# Patient Record
Sex: Female | Born: 1965 | Race: White | Hispanic: No | Smoking: Current every day smoker
Health system: Southern US, Community
[De-identification: ages and names within clinical notes are randomized; demographics above are authoritative.]

## PROBLEM LIST (undated history)

## (undated) DIAGNOSIS — B1920 Unspecified viral hepatitis C without hepatic coma: Secondary | ICD-10-CM

## (undated) DIAGNOSIS — A599 Trichomoniasis, unspecified: Secondary | ICD-10-CM

## (undated) DIAGNOSIS — I493 Ventricular premature depolarization: Secondary | ICD-10-CM

## (undated) DIAGNOSIS — M797 Fibromyalgia: Secondary | ICD-10-CM

## (undated) DIAGNOSIS — R569 Unspecified convulsions: Secondary | ICD-10-CM

## (undated) DIAGNOSIS — R768 Other specified abnormal immunological findings in serum: Secondary | ICD-10-CM

## (undated) DIAGNOSIS — F32A Depression, unspecified: Secondary | ICD-10-CM

## (undated) DIAGNOSIS — F1120 Opioid dependence, uncomplicated: Secondary | ICD-10-CM

## (undated) DIAGNOSIS — F1410 Cocaine abuse, uncomplicated: Secondary | ICD-10-CM

## (undated) DIAGNOSIS — M545 Low back pain, unspecified: Secondary | ICD-10-CM

## (undated) DIAGNOSIS — F332 Major depressive disorder, recurrent severe without psychotic features: Secondary | ICD-10-CM

## (undated) DIAGNOSIS — F329 Major depressive disorder, single episode, unspecified: Secondary | ICD-10-CM

## (undated) DIAGNOSIS — R896 Abnormal cytological findings in specimens from other organs, systems and tissues: Secondary | ICD-10-CM

## (undated) DIAGNOSIS — IMO0001 Reserved for inherently not codable concepts without codable children: Secondary | ICD-10-CM

## (undated) DIAGNOSIS — A159 Respiratory tuberculosis unspecified: Secondary | ICD-10-CM

## (undated) DIAGNOSIS — F419 Anxiety disorder, unspecified: Secondary | ICD-10-CM

## (undated) DIAGNOSIS — I10 Essential (primary) hypertension: Secondary | ICD-10-CM

## (undated) DIAGNOSIS — G8929 Other chronic pain: Secondary | ICD-10-CM

## (undated) DIAGNOSIS — D72829 Elevated white blood cell count, unspecified: Secondary | ICD-10-CM

## (undated) DIAGNOSIS — K219 Gastro-esophageal reflux disease without esophagitis: Secondary | ICD-10-CM

## (undated) DIAGNOSIS — E876 Hypokalemia: Secondary | ICD-10-CM

## (undated) HISTORY — DX: Respiratory tuberculosis unspecified: A15.9

## (undated) HISTORY — PX: DILATION AND CURETTAGE OF UTERUS: SHX78

## (undated) HISTORY — DX: Trichomoniasis, unspecified: A59.9

---

## 2005-01-08 ENCOUNTER — Emergency Department (HOSPITAL_COMMUNITY): Admission: EM | Admit: 2005-01-08 | Discharge: 2005-01-08 | Payer: Self-pay | Admitting: Emergency Medicine

## 2005-01-18 ENCOUNTER — Inpatient Hospital Stay (HOSPITAL_COMMUNITY): Admission: EM | Admit: 2005-01-18 | Discharge: 2005-01-19 | Payer: Self-pay | Admitting: Emergency Medicine

## 2008-08-29 ENCOUNTER — Emergency Department (HOSPITAL_COMMUNITY): Admission: EM | Admit: 2008-08-29 | Discharge: 2008-08-29 | Payer: Self-pay | Admitting: Emergency Medicine

## 2008-11-19 HISTORY — PX: SALPINGECTOMY: SHX328

## 2008-11-27 ENCOUNTER — Encounter: Payer: Self-pay | Admitting: Obstetrics and Gynecology

## 2008-11-27 ENCOUNTER — Inpatient Hospital Stay (HOSPITAL_COMMUNITY): Admission: AD | Admit: 2008-11-27 | Discharge: 2008-11-27 | Payer: Self-pay | Admitting: Obstetrics & Gynecology

## 2008-11-30 ENCOUNTER — Ambulatory Visit (HOSPITAL_COMMUNITY): Admission: AD | Admit: 2008-11-30 | Discharge: 2008-11-30 | Payer: Self-pay | Admitting: Obstetrics & Gynecology

## 2008-11-30 ENCOUNTER — Encounter: Payer: Self-pay | Admitting: Obstetrics and Gynecology

## 2011-01-01 LAB — CBC
HCT: 28.7 % — ABNORMAL LOW (ref 36.0–46.0)
Hemoglobin: 9.6 g/dL — ABNORMAL LOW (ref 12.0–15.0)
MCHC: 34 g/dL (ref 30.0–36.0)
MCHC: 33.8 g/dL (ref 30.0–36.0)
Platelets: 198 10*3/uL (ref 150–400)
RBC: 3.21 MIL/uL — ABNORMAL LOW (ref 3.87–5.11)
RDW: 16.7 % — ABNORMAL HIGH (ref 11.5–15.5)

## 2011-01-01 LAB — URINE MICROSCOPIC-ADD ON

## 2011-01-01 LAB — POCT I-STAT, CHEM 8
BUN: 18 mg/dL (ref 6–23)
Calcium, Ion: 1.14 mmol/L (ref 1.12–1.32)
Chloride: 108 mEq/L (ref 96–112)
Glucose, Bld: 87 mg/dL (ref 70–99)

## 2011-01-01 LAB — URINALYSIS, ROUTINE W REFLEX MICROSCOPIC
Bilirubin Urine: NEGATIVE
Glucose, UA: NEGATIVE mg/dL
Hgb urine dipstick: NEGATIVE
Specific Gravity, Urine: >1.03 — ABNORMAL HIGH (ref 1.005–1.030)
pH: 6 (ref 5.0–8.0)

## 2011-01-01 LAB — WET PREP, GENITAL: Clue Cells Wet Prep HPF POC: NONE SEEN

## 2011-01-01 LAB — GC/CHLAMYDIA PROBE AMP, GENITAL
Chlamydia, DNA Probe: NEGATIVE
GC Probe Amp, Genital: NEGATIVE

## 2011-01-01 LAB — DIFFERENTIAL
Basophils Relative: 1 % (ref 0–1)
Lymphocytes Relative: 24 % (ref 12–46)
Monocytes Absolute: 0.6 10*3/uL (ref 0.1–1.0)
Monocytes Relative: 7 % (ref 3–12)
Neutro Abs: 5 10*3/uL (ref 1.7–7.7)

## 2011-02-03 NOTE — Op Note (Signed)
Ariana Jensen, Ariana Jensen           ACCOUNT NO.:  [id number]   MEDICAL RECORD NO.:  [id number]          PATIENT TYPE:  AMB   LOCATION:  SDC                           FACILITY:  [facility]   PHYSICIAN:  Cynthia P. Romine, M.D.DATE OF BIRTH:  08-21-1966   DATE OF PROCEDURE:  11/30/2008  DATE OF DISCHARGE:                               OPERATIVE REPORT   PREOPERATIVE DIAGNOSIS:  Left ectopic pregnancy.   POSTOPERATIVE DIAGNOSIS:  Left ectopic pregnancy.   PATHOLOGY:  Pending.   PROCEDURE:  Laparoscopic left salpingectomy.   SURGEON:  Cynthia P. Romine, MD   ANESTHESIA:  General by endotracheal tube.   ESTIMATED BLOOD LOSS:  Minimal.   COMPLICATIONS:  None.   PROCEDURE:  The patient was taken to the operating room and after  induction of adequate general anesthesia, was placed in a dorsal  lithotomy position and prepped and draped in usual fashion.  A pelvic  examination was done, which revealed the external genitalia and vagina  to be normal as was the cervix.  There was slight bleeding in the vault,  but no active bleeding at the cervix.  The uterus was mobile and normal  size.  No masses were palpable in the adnexa.  A Foley catheter was  inserted.  Attention was next turned to the abdomen.  Marcaine 0.25% was  infiltrated below the umbilicus and a small subumbilical incision was  made.  The Veress needle was inserted into the peritoneal space.  Proper  placement was tested by noting a negative aspirate, then free flow of  saline through the Veress needle again with a negative aspirate and then  by noting the response of a drop of saline to negative pressure as the  abdominal wall was elevated.  Pneumoperitoneum was created with 2.5 L of  CO2 using the automatic insufflator.  A disposable 10/11 mm trocar was  then inserted into the peritoneal space with proper placement noted with  the laparoscope.  Inspection of the abdomen revealed there to be  approximately 20 cc of blood in the  anterior cul-de-sac.  The uterus  appeared of normal size and shape.  The left tube was dilated to about 2  cm in its mid-isthmic area consistent with a left ectopic.  The fimbriae  were luxuriant on that side.  There were some filmy adhesions between  the fimbriae and the ovary.  On the right side, the tube appeared  normal.  There were likewise some filmy adhesions between the fimbriae  and the ovary but otherwise appeared normal.  The upper abdomen was  explored.  The liver edge was smooth.  The gallbladder was distended.  No abnormalities were noted.  A tripolar cutting instrument was used to  cut across the tube at its junction with the fundus and it was used to  come across the mesosalpinx to the fimbriae.  When the tube was freed,  it was pulled out through the operative laparoscope with a long forceps  with teeth.  Good hemostasis was noted.  The pelvis was irrigated.  Photographic documentation was done.  Instruments were removed from the  abdomen.  Pneumoperitoneum was allowed to escape.  The fascia at the  umbilicus was closed with a single suture of 0 Vicryl.  The skin at the  umbilicus was closed with subcuticular suture of 3-0 Vicryl and then the  5-mm incisions in the right and left lower quadrant, which had been made  and placed under direct visualization, were closed with Dermabond.  It  should be noted her blood type was A+.      Cynthia P. Romine, M.D.  Electronically Signed     CPR/MEDQ  D:  11/30/2008  T:  12/01/2008  Job:  [id number]

## 2011-02-06 NOTE — Discharge Summary (Signed)
NAMELASHUNDA, Ariana Jensen NO.:  [id number]   MEDICAL RECORD NO.:  [id number]          PATIENT TYPE:  INP   LOCATION:  0353                         FACILITY:  [facility]   PHYSICIAN:  Ariana Shirk, MD     DATE OF BIRTH:  10-15-1965   DATE OF ADMISSION:  01/18/2005  DATE OF DISCHARGE:  01/19/2005                                 DISCHARGE SUMMARY   DISCHARGE DIAGNOSES:  1. Overdose on Fioricet.  2. Hypokalemia.     DISCHARGE MEDICATIONS:  None - the patient left AMA.   FOLLOW-UP APPOINTMENTS:  With [facility] as scheduled before.   HISTORY OF PRESENT ILLNESS:  [name] is a 45 year old Caucasian woman  with a past history of drug abuse including heroin, cocaine, and marijuana  on drug rehab and on methadone treatment for 4-5 years. Was seen by Dr.  Cherly Jensen about a week ago and given a prescription for Fioricet and Soma. The  patient took 36 tablets of Soma in the 6 days prior to admission, 15 of them  in the 36 hours prior to admission. The patient's husband stated when the  patient was brought to the ED that she had a prescription for Fioricet  refilled on the day of admission and the patient took 24 of them, at which  time the patient's husband called 911 and brought her to the emergency room.  The patient stated in the ER that she took it because she was angry with her  husband and had some difficulty with family dynamics. At the time of  admission the patient was communicating, had no other complaints other than  her body being hot. No nausea/vomiting, no abdominal pain, no change in  vision, no loss of consciousness. Tylenol level in the ED was 125.3;  repeated came down to 98.2. The patient received a dose of Mucomyst and her  liver function tests were normal. UDS was positive for cocaine.   PAST MEDICAL HISTORY:  1. IV drug abuse.  2. Polysubstance abuse including heroin, cocaine, and marijuana.     PHYSICAL EXAMINATION ON ADMISSION:  VITAL SIGNS:  Blood  pressure 129/72,  pulse 103, respirations 18, saturations 97%, temperature 98.9.  GENERAL:  Alert and oriented x3 in no acute distress.  HEENT:  PERRL, normocephalic, atraumatic. Sclerae anicteric, normal.  NECK: Supple, no LAD, no JVD.  LUNGS:  Clear to auscultation bilaterally.  ABDOMEN:  Soft, no tenderness, no masses.  CARDIOVASCULAR:  S1 + S2, regular rate and rhythm.  EXTREMITIES:  Multiple scratch marks on the lower extremities in different  stages of healing. No cyanosis, clubbing, or edema.  NEUROLOGIC:  No focal deficits.   LABORATORY DATA ON ADMISSION:  Sodium 139, potassium 3.1, chloride 108,  bicarb 24, glucose 131, BUN 10, creatinine 0.7, bili 0.6, alkaline  phosphatase 42, AST 21, ALT 20, total protein 6.5, albumin 3.9, calcium 9.2.  Alcohol level less than 5. Acetaminophen level 125.3 upon initial arrival; 2  hours later, 98.2. Urine pregnancy test negative. UDS positive for  barbiturates and cocaine. Salicylate level less than 4.   HOSPITAL COURSE:  The patient was admitted to  a monitored bed.   #1 - DRUG OVERDOSE. The patient was seen by Dr. Jeanie Jensen of psychiatry and  determined that she did not need commitment and she had agreed to call  emergency for distress. She also has appointments with [facility] and the methadone  clinic. The patient was released by psychiatry for discharge.   #2 - HYPOKALEMIA. The patient's potassium was 3.1 on admission. She was  given p.o. potassium. Repeat levels were 4.1.   On the evening of Jan 19, 2005 after the patient was seen by Dr. Jeanie Jensen,  she signed out against medical advice. She was advised by the nursing staff  of the risks of signing out against medical advice. However, the patient  insisted on leaving AMA and left the hospital at 10:15 p.m. on Jan 19, 2005.      GDK/MEDQ  D:  01/20/2005  T:  01/20/2005  Job:  [id number]   cc:   Ariana Jensen, M.D.

## 2011-02-06 NOTE — H&P (Signed)
NAME:  Ariana Jensen, WEIER NO.:  [id number]   MEDICAL RECORD NO.:  [id number]          PATIENT TYPE:  EMS   LOCATION:  ED                           FACILITY:  [facility]   PHYSICIAN:  Mobolaji B. Bakare, M.D.DATE OF BIRTH:  04-28-1966   DATE OF ADMISSION:  01/18/2005  DATE OF DISCHARGE:                                HISTORY & PHYSICAL   PRIMARY CARE PHYSICIAN:  Dr. Cherly Hensen at [facility].   CHIEF COMPLAINT:  Overdose with Fioricet 24 tablets between 1:00 and 3:00  p.m. today.   HISTORY OF PRESENT ILLNESS:  Ariana Jensen is a 45 year old Caucasian female  with past medical history of drug abuse, heroin, cocaine, marijuana.  The  patient has undergone drug rehabilitation and currently on methadone  treatment for 4-5 years, she saw Dr. Cherly Hensen about a week ago and she was  given prescription for Fioricet and soma.  She has taken 36 tablets of soma  in the last six days, 15 of those were taken in the last 36 hours.   Her husband filled the prescription for Fioricet today and he stated that  the patient took these tablets the whole lot of 24, between 1:00 and 3:00  p.m. hence he called 911 and the patient was brought to the emergency  department.  The patient stated she took it because she is angry with her  husband and apparently there is some family dynamics going on in their  relationship.  The patient is currently communicating and she has no  complaints other than her whole body is hot, she denies any headaches, no  nausea, no vomiting, no epigastric pain, no change in her vision, and there  has been no loss of consciousness.  She had acetaminophen level done in the  emergency department at 4:30 p.m. which was 125.3 and this was repeated at  6:30 p.m. it came down to 98.2.  The patient received a dose of Mucomyst for  the second acetaminophen level and liver function tests, AST, ALT were  within normal, urine drug screen is positive for cocaine.   REVIEW OF SYSTEMS:  No  fever, no chills, no cough, no chest pain, no  abdominal pain, no dysuria or urgency.   PAST MEDICAL HISTORY:  1.  IV drug abuse.  2.  Polysubstance abuse with heroin, cocaine and marijuana.   PAST SURGICAL HISTORY:  None.   FAMILY HISTORY:  She is married and she has been with her current husband  for about five years.  She has as grown up child.   SOCIAL HISTORY:  She denies alcohol abuse and smoking.  She is currently  unemployed.   VITAL SIGNS:  Temperature 98.9, blood pressure 129/72, pulse 103,  respiratory rate 18, O2 saturation 97%.  On examination the patient is  awake, alert, oriented, intact time, place and person.  Pupils equal, round,  reactive to light.  No elevated JVD, no thyromegaly.  Mucous membranes  moist.  LUNGS:  Clear clinically to auscultation.  CVS:  S1, S2 regular, no murmur, no gallop.  ABDOMEN:  Nondistended, soft, nontender, no hepatosplenomegaly, bowel sounds  present.  EXTREMITIES:  She has got multiple scratch marks on the lower extremities  which are in different states of healing.  No pedal edema.  Dorsalis pedis  pulses palpable bilaterally.  SKIN:  No rash, no petechiae.  CNS:  No focal neurological deficit.   INITIAL LABORATORY DATA:  Sodium 139, potassium 3.1, chloride 108, bicarb  24, glucose 131, BUN 10, creatinine 0.7, bilirubin 0.6, alkaline phosphatase  44, AST 21, ALT 20, total protein 6.5, albumin 3.9, calcium 9.2, alcohol  level <5, acetaminophen level 125.3 at 4:25 p.m., repeated level at 6:30 was  98.2.  Urine pregnancy test was negative.  Tricyclic none detected.  Urine  drug screen was positive for barbiturates and cocaine.  Salicylate level was  less than 4.  EKG not available now.   ASSESSMENT:  1.  Overdose with Fioricet and soma.  2.  Attempted suicide.  3.  Substance abuse (cocaine).  4.  Hypokalemia.   PLAN:  1.  Continue 24-hour sitter.  We will discontinue Mucomyst as level of      acetaminophen is less than 150  within the possible four hours post      ingestion.  2.  Follow up liver function tests in the a.m.  3.  Obtain psychiatric evaluation.  4.  Supportive care.  5.  We will give Kayexalate 40 mEq x1 now and check BMET in the a.m.  6.  The patient will need inpatient admission to behavioral health Center.      MBB/MEDQ  D:  01/18/2005  T:  01/18/2005  Job:  [id number]

## 2011-04-02 ENCOUNTER — Encounter: Payer: Self-pay | Admitting: Internal Medicine

## 2011-04-02 ENCOUNTER — Observation Stay (HOSPITAL_COMMUNITY)
Admission: EM | Admit: 2011-04-02 | Discharge: 2011-04-03 | Disposition: A | Discharge status: Home / Self Care | Payer: Self-pay | Attending: Internal Medicine | Admitting: Internal Medicine

## 2011-04-02 ENCOUNTER — Emergency Department (HOSPITAL_COMMUNITY): Payer: Self-pay

## 2011-04-02 DIAGNOSIS — F1120 Opioid dependence, uncomplicated: Secondary | ICD-10-CM | POA: Insufficient documentation

## 2011-04-02 DIAGNOSIS — F1911 Other psychoactive substance abuse, in remission: Secondary | ICD-10-CM | POA: Insufficient documentation

## 2011-04-02 DIAGNOSIS — A599 Trichomoniasis, unspecified: Secondary | ICD-10-CM | POA: Insufficient documentation

## 2011-04-02 DIAGNOSIS — R079 Chest pain, unspecified: Principal | ICD-10-CM | POA: Insufficient documentation

## 2011-04-02 DIAGNOSIS — F17200 Nicotine dependence, unspecified, uncomplicated: Secondary | ICD-10-CM | POA: Insufficient documentation

## 2011-04-02 DIAGNOSIS — R0789 Other chest pain: Secondary | ICD-10-CM | POA: Insufficient documentation

## 2011-04-02 LAB — URINALYSIS, ROUTINE W REFLEX MICROSCOPIC
Nitrite: NEGATIVE
Protein, ur: NEGATIVE mg/dL
Specific Gravity, Urine: 1.031 — ABNORMAL HIGH (ref 1.005–1.030)
Urobilinogen, UA: 1 mg/dL (ref 0.0–1.0)

## 2011-04-02 LAB — COMPREHENSIVE METABOLIC PANEL
BUN: 25 mg/dL — ABNORMAL HIGH (ref 6–23)
CO2: 24 mEq/L (ref 19–32)
Calcium: 8.1 mg/dL — ABNORMAL LOW (ref 8.4–10.5)
Chloride: 103 mEq/L (ref 96–112)
Creatinine, Ser: 0.57 mg/dL (ref 0.50–1.10)
GFR calc Af Amer: >60 mL/min (ref >60)
GFR calc non Af Amer: >60 mL/min (ref >60)
Glucose, Bld: 97 mg/dL (ref 70–99)
Total Bilirubin: 0.1 mg/dL — ABNORMAL LOW (ref 0.3–1.2)

## 2011-04-02 LAB — TROPONIN I: Troponin I: <0.3 ng/mL (ref <0.30)

## 2011-04-02 LAB — POCT PREGNANCY, URINE: Preg Test, Ur: NEGATIVE

## 2011-04-02 LAB — CBC
Hemoglobin: 12.5 g/dL (ref 12.0–15.0)
MCHC: 34 g/dL (ref 30.0–36.0)
Platelets: 179 10*3/uL (ref 150–400)
RDW: 16.1 % — ABNORMAL HIGH (ref 11.5–15.5)

## 2011-04-02 LAB — DIFFERENTIAL
Basophils Absolute: 0 10*3/uL (ref 0.0–0.1)
Basophils Relative: 0 % (ref 0–1)
Eosinophils Absolute: 0.5 10*3/uL (ref 0.0–0.7)
Monocytes Absolute: 0.5 10*3/uL (ref 0.1–1.0)
Neutro Abs: 7.2 10*3/uL (ref 1.7–7.7)

## 2011-04-02 LAB — CK TOTAL AND CKMB (NOT AT ARMC): Total CK: 47 U/L (ref 7–177)

## 2011-04-02 LAB — URINE MICROSCOPIC-ADD ON

## 2011-04-02 LAB — RAPID URINE DRUG SCREEN, HOSP PERFORMED
Amphetamines: NOT DETECTED
Benzodiazepines: NOT DETECTED
Tetrahydrocannabinol: NOT DETECTED

## 2011-04-02 LAB — CARDIAC PANEL(CRET KIN+CKTOT+MB+TROPI)
CK, MB: 1.7 ng/mL (ref 0.3–4.0)
Total CK: 35 U/L (ref 7–177)

## 2011-04-02 NOTE — H&P (Signed)
Hospital Admission Note Date: 04/02/2011  Patient name: Ariana Jensen Medical record number: [id number] Date of birth: 08-Oct-1965 Age: 45 y.o. Gender: female PCP: No primary provider on file.  This is an unassigned patient  Attending physician: Dr. Josem Kaufmann  First contact: Resident (R1): Dr. Abner Greenspan   Pager: [phone] Second contact: Resident (R3): Dr. Baltazar Apo   Pager: [phone]  Weekends, holidays, after 5 PM weekdays First contact: [phone] Second contact: [phone]  Chief Complaint: Chest pain  History of Present Illness:  Patient is a 45 year-old female with past medical history significant for  IV drug use approximately 10 years ago. She presents the emergency department with a three-day history of right sided, 6.5/10 chest pain radiating into her back. She admits to some shortness of breath associated with her pain and states her chest discomfort worsens with deep inspiration. She states her pain is worse when lying on her left side but otherwise is without aggravating factors. She notes that her pain is improved after receiving her morning methadone dose as well as was taking over-the-counter aspirin and Aleve; the aspirin and Aleve alleviated her pain for approximately 2 hours.  Her chest pain was slightly improved after receiving nitroglycerin and morphine in the emergency department. She denies any headache, dyspnea on exertion, exertional chest pain, syncope, diaphoresis, nausea, vomiting, abdominal pain, diarrhea or  other complaint.  Allergies: NKDA  Home medications: None 95 mg tablet by mouth every morning  Past medical history: History of tuberculosis at age 89     - Status post one year of multidrug therapy History of left ectopic pregnancy    - Status post left salpingectomy, March 2010 History of cesarean section, remote History of suicide attempt with Fiorcet History of polysubstance abuse: cocaine, heroin, marijuana    - Patient is currently on methadone  maintenance; she enrolled in the program approximately 10 years ago   Social history: She is widowed, her husband died 2 years ago as a result of a massive MI. She currently lives in [place] with her boyfriend. She is unemployed. She is a current smoker; smokes one pack per day for the past 20-25 years.  Has a history of IV heroin use. She states she's been clean for the past 10 years and is currently enrolled in a methadone maintenance program. She admits to rare alcohol use.   Family history: Mother and father are both deceased from lung cancer. He denies a family history of diabetes mellitus, hypertension, or cardio vascular disease.   Review of Systems: Pertinent items are noted in HPI.  Physical Exam:  Vital signs: T.: 97.7, HR: 56, BP: 156/93, RR.: 16, O2 sat: 100% on RA VItal signs reviewed and stable. GEN: No apparent distress.  Alert and oriented x 3.  Pleasant, conversant, and cooperative to exam. HEENT: head is autraumatic and normocephalic.  Neck is supple without palpable masses or lymphadenopathy. Vision intact.  EOMI.  PERRLA.  Sclerae anicteric.  Conjunctivae without pallor or injection. Mucous membranes are moist.  Oropharynx is without erythema, exudates, or other abnormal lesions.  Dentition is fair. RESP:  Lungs are clear to ascultation bilaterally with good air movement.  No wheezes, ronchi, or rubs. CARDIOVASCULAR: Bradycardic, normal rhythm.  Clear S1, S2, no murmurs, gallops, or rubs.  Chest wall tenderness along the right mid axillary line. ABDOMEN: soft, non-tender, non-distended.  Bowels sounds present in all quadrants and normoactive.  No palpable masses. EXT: warm and dry.  Peripheral pulses equal, intact, and +2 globally.  No clubbing or  cyanosis.  No edema in bilateral lower extremities. SKIN: warm and dry with normal turgor.  No rashes or abnormal lesions observed. NEURO: CN II-XII grossly intact.  Muscle strength +5/5 in bilateral upper and lower  extremities.  Sensation is grossly intact.  No focal deficit.    Lab results:  WBC                                      10.7       h      4.0-10.5         K/uL  RBC                                      4.06              3.87-5.11        MIL/uL  Hemoglobin (HGB)                         12.5              12.0-15.0        g/dL  Hematocrit (HCT)                         36.8              36.0-46.0        %  MCV                                      90.6              78.0-100.0       fL  MCH -                                    30.8              26.0-34.0        pg  MCHC                                     34.0              30.0-36.0        g/dL  RDW                                      16.1       h      11.5-15.5        %  Platelet Count (PLT)                     179               150-400          K/uL  Neutrophils, %                           67  43-77            %  Lymphocytes, %                           23                12-46            %  Monocytes, %                             5                 3-12             %  Eosinophils, %                           5                 0-5              %  Basophils, %                             0                 0-1              %  Neutrophils, Absolute                    7.2               1.7-7.7          K/uL  Lymphocytes, Absolute                    2.5               0.7-4.0          K/uL  Monocytes, Absolute                      0.5               0.1-1.0          K/uL  Eosinophils, Absolute                    0.5               0.0-0.7          K/uL  Basophils, Absolute                      0.0               0.0-0.1          K/uL  Sodium (NA)                              135               135-145          mEq/L  Potassium (K)                            4.2  3.5-5.1          mEq/L  Chloride                                 103               96-112           mEq/L  CO2                                      24                 19-32            mEq/L  Glucose                                  97                70-99            mg/dL  BUN                                      25         h      6-23             mg/dL  Creatinine                               0.57              0.50-1.10        mg/dL  GFR, Est Non African American            >60               >60              mL/min  GFR, Est African American                >60               >60              mL/min  Bilirubin, Total                         0.1        l      0.3-1.2          mg/dL  Alkaline Phosphatase                     45                39-117           U/L  SGOT (AST)                               22                0-37             U/L    SLIGHT HEMOLYSIS  SGPT (ALT)  18                0-35             U/L  Total  Protein                           6.7               6.0-8.3          g/dL  Albumin-Blood                            3.1        l      3.5-5.2          g/dL  Calcium                                  8.1        l      8.4-10.5         Mg/dL   Pregnancy, Urine-POC                     NEGATIVE  Color, Urine                             YELLOW            YELLOW  Appearance                               CLOUDY     a      CLEAR  Specific Gravity                         1.031      h      1.005-1.030  pH                                       6.0               5.0-8.0  Urine Glucose                            NEGATIVE          NEG              mg/dL  Bilirubin                                NEGATIVE          NEG  Ketones                                  NEGATIVE          NEG              mg/dL  Blood  NEGATIVE          NEG  Protein                                  NEGATIVE          NEG              mg/dL  Urobilinogen                             1.0               0.0-1.0          mg/dL  Nitrite                                  NEGATIVE          NEG  Leukocytes                                SMALL      a      NEG  Squamous Epithelial / LPF                MANY       a      RARE  WBC / HPF                                3-6               <3               WBC/hpf  RBC / HPF                                0-2               <3               RBC/hpf  Bacteria / HPF                           FEW        a      RARE  Urine-Other                              SEE NOTE.    TRICHOMONAS PRESENT   Creatine Kinase, Total                   47                7-177            U/L  CK, MB                                   1.7               0.3-4.0          ng/mL  Troponin I                               <  0.30             <0.30            ng/mL  Imaging results:  Chest x-ray, portable: No acute process  Assessment & Plan by Problem:  Chest pain Patient is presenting with a three-day history of progressive, right-sided chest pain.  Her symptoms are not typical of cardiac chest pain.  Her initial set of cardiac enzymes is negative and her EKG is without evidence of ischemia or other concerning process. Her chest x-ray does not really feel evidence of pneumothorax or pneumonia.  She is a current smoker and has not seen a physician in a number of years; it is reasonable to proceed with full evaluation for ACS. Her chest pain is most likely the result of musculoskeletal strain plus minus GERD.  Gallbladder dysfunction is also possible however this seems less likely as her symptoms are not exacerbated by food and she's not experience any nausea, vomiting, or diarrhea.  Her bias Geneva score is very low probability for PE; therefore not proceed with further workup for this.   - Admit to telemetry - Cycle cardiac enzymes - Risk stratify with fasting lipid panel and hemoglobin A1c - Repeat 12-lead EKG in the morning - Protonix for treatment of possible GERD - Pain control with Toradol - Urine drug screen to evaluate for possible cocaine induced chest pain  Trichomoniasis Trichomonas was evident on  routine urinalysis obtain an emergent apartment. This result was discussed with the patient she was informed that this is a sexual transmitted disease and that her partner will require treatment.  She is advised to avoid further unprotected sexual intercourse with her partner until he completes treatment. -  Will check HIV Ab, urine for gonorrhea and chlamydia, acute hepatitis panel -  Will treat with Flagyl  Methadone Maintenance  - Will continue with her daily methadone regimen while she is an inpatient   Tobacco use  - Nicotine patch for management of withdrawal  - Smoking cessation consult   VTE prophylaxis: Lovenox     ___________________  Nelda Bucks, DO  (PGY-3)     ___________________  Kathreen Cosier, MD (PGY-1)

## 2011-04-03 LAB — HIV ANTIBODY (ROUTINE TESTING W REFLEX): HIV: NONREACTIVE

## 2011-04-03 LAB — HEPATITIS PANEL, ACUTE
HCV Ab: REACTIVE — AB
Hep A IgM: NEGATIVE
Hepatitis B Surface Ag: NEGATIVE

## 2011-04-03 LAB — LIPID PANEL
Cholesterol: 164 mg/dL (ref 0–200)
LDL Cholesterol: 77 mg/dL (ref 0–99)
Total CHOL/HDL Ratio: 3 RATIO
VLDL: 33 mg/dL (ref 0–40)

## 2011-04-03 LAB — CARDIAC PANEL(CRET KIN+CKTOT+MB+TROPI)
Relative Index: INVALID (ref 0.0–2.5)
Total CK: 38 U/L (ref 7–177)
Troponin I: <0.3 ng/mL (ref <0.30)

## 2011-04-03 LAB — URINE CULTURE

## 2011-04-05 LAB — GC/CHLAMYDIA PROBE AMP, URINE: GC Probe Amp, Urine: NEGATIVE

## 2011-04-17 ENCOUNTER — Encounter: Payer: Self-pay | Admitting: Internal Medicine

## 2011-04-17 ENCOUNTER — Telehealth: Payer: Self-pay | Admitting: Internal Medicine

## 2011-04-17 DIAGNOSIS — F1910 Other psychoactive substance abuse, uncomplicated: Secondary | ICD-10-CM

## 2011-04-17 DIAGNOSIS — R768 Other specified abnormal immunological findings in serum: Secondary | ICD-10-CM

## 2011-04-17 DIAGNOSIS — A599 Trichomoniasis, unspecified: Secondary | ICD-10-CM

## 2011-04-17 HISTORY — DX: Trichomoniasis, unspecified: A59.9

## 2011-04-17 NOTE — Telephone Encounter (Signed)
Patient was called, and a message was left on her machine.  She was informed that she missed her appointment with [place] today.  She was told that she had some abnormalities on her lab results from her recent hospitalization, and that she should call our clinic front desk to schedule an appointment to discuss these results, and run some follow-up lab work.  Absolutely no specific medical details were given, in case the phone is shared by other people.  At her first visit, we will plan to follow-up her recent positive HCV Ab with PCR testing, and can discuss treatment options at that time.

## 2011-04-25 NOTE — Discharge Summary (Signed)
NAMEBETTYJO, LUNDBLAD NO.:  [id number]  MEDICAL RECORD NO.:  [id number]  LOCATION:  3708                         FACILITY:  [facility]  PHYSICIAN:  Doneen Poisson, MD     DATE OF BIRTH:  27-Jan-1966  DATE OF ADMISSION:  04/02/2011 DATE OF DISCHARGE:  04/03/2011                              DISCHARGE SUMMARY   DISCHARGE DIAGNOSES: 1. Atypical chest pain. 2. Trichomoniasis. 3. Methadone maintenance. 4. Tobacco use. 5. History of drug abuse.  DISCHARGE MEDICATIONS WITH ACCURATE DOSES: 1. Hydrochlorothiazide 25 mg p.o. daily. 2. Aleve 220 mg p.o. q.3-4 h. p.r.n. pain. 3. Methadone 95 mg p.o. daily.  DISPOSITION AND FOLLOWUP:  [facility], Dr. Manson Passey on April 17, 2011, at 3 p.m.  At this visit, she needs a basic metabolic panel because she was started on hydrochlorothiazide.  PROCEDURE PERFORMED:  None.  CONSULTATION:  None.  BRIEF ADMITTING HISTORY AND PHYSICAL:  This was a 45 year old woman with a past medical history significant for IV drug use approximately 10 years ago.  She has been on methadone maintenance since then.  She presented to the emergency room with a 3-day history of right-sided 6.5/10 chest pain radiating to her back.  She has also had some shortness of breath and stated that her chest discomfort worsened with deep inspiration. Her pain was also worse when lying on her left side, but otherwise was without aggravating factors.  She had tried taking over-the-counter pain medicines without much relief, but her chest pain was slightly improved in the ED after receiving some morphine.  She denied any headache, nausea, vomiting, dyspnea on exertion, exertional chest pain, syncope, diaphoresis, abdominal pain, or any other complaints.  PHYSICAL EXAMINATION: VITAL SIGNS:  Temperature 97.7, heart rate 56, BP 156/93, respiratory  rate 16, O2 saturation 100% on room air. GENERAL:  No acute distress.  Alert and oriented x  3. RESPIRATORY:  Clear to auscultation bilaterally with good air movement.   No wheezes, rhonchi, or rales. CARDIOVASCULAR:  Bradycardic with a normal rhythm.  Clear S1 and S2.  No murmurs, rubs, or gallops.  Chest wall tenderness along the right  midaxillary line. ABDOMEN:  Soft, nontender, and nondistended with normal bowel sounds. EXTREMITIES:  Warm and dry with no edema. NEURO:  Grossly intact.  LAB RESULTS:  White blood cell count 10.7, hemoglobin 12.5,  hematocrit 36.8, platelets 179.  Sodium 135, potassium 4.2,  chloride 103, CO2 24, BUN 25, creatinine 0.57, glucose 97, alkaline  phosphatase 45, bilirubin 0.1, AST 22, ALT 18, albumin 3.1.  Urine  pregnancy test negative.  Urinalysis Trichomonas.  Cardiac enzymes  negative.  Chest x-ray showed no acute process  EKG unremarkable.  HOSPITAL COURSE BY PROBLEM: 1. Chest pain.  Ms. Wenner presented with atypical chest     pain that manifested on the right side midaxillary line that was     worse with deep inspiration.  Given her lack of risk factors,      initially negative lab values and normal EKG a cardiac source of      this chest pain was very unlikely.  She also had a very low      modified Geneva score and the risk  of PE was also quite low.       Leading diagnoses were musculoskeletal, gastroenterologic or      pleuritis with an outside chance of being some early manifestation      of zoster.  The following morning, her pain worsened acutely.  At the     bedside, she was examined and found to have similar pain in the     same exact area with minimal change in the quality or type of her     symptoms.  Toradol therapy was continued for antiinflammatory     effects and a few hours later her pain had resolved completely.       Her cardiac enzymes continued to be negative and her urine drug      screen was negative.  She was very happy and ready for discharge      and felt that her pain had been treated adequately.  By the  time      of discharge, pleuritis or musculoskeletal were the most likely      cause of her chest pain.  2. Trichomoniasis.  Ms. Uriegas is in a relationship with her     boyfriend of roughly 1 year and she claims that he is her only     sexual partner.  We discussed with her the importance of having him     get treated for Trichomonas too.  She was treated with 2 g     of Flagyl while in the hospital for her trichomoniasis.  Her HIV     antibody was negative and her urine gonorrhea and Chlamydia are     were pending at discharge.  3. Methadone maintenance.  Ms. Mcphearson was continued on a daily     methadone regimen and was to be discharged to continue that at the     [facility] where she receives her methadone.  4. Tobacco use.  Ms. Lozada was given a nicotine patch and counseled     on smoking cessation.  DISCHARGE VITAL SIGNS:  Temperature 98.0, pulse 47,  respiratory rate 18, blood pressure 164/94, O2 saturation 90% on room air.   ______________________________ Kathreen Cosier, MD   ______________________________ Doneen Poisson, MD   BW/MEDQ  D:  04/03/2011  T:  04/04/2011  Job:  [id number]  cc:   [facility]  Electronically Signed by Kathreen Cosier MD on 04/13/2011 02:54:49 PM Electronically Signed by Doneen Poisson  on 04/25/2011 09:44:18 AM

## 2011-04-30 ENCOUNTER — Encounter: Payer: Self-pay | Admitting: Internal Medicine

## 2011-06-19 ENCOUNTER — Emergency Department (HOSPITAL_COMMUNITY)
Admission: EM | Admit: 2011-06-19 | Discharge: 2011-06-19 | Disposition: A | Discharge status: Home / Self Care | Payer: Self-pay | Attending: Emergency Medicine | Admitting: Emergency Medicine

## 2011-06-19 DIAGNOSIS — IMO0002 Reserved for concepts with insufficient information to code with codable children: Secondary | ICD-10-CM | POA: Insufficient documentation

## 2011-06-19 DIAGNOSIS — S61209A Unspecified open wound of unspecified finger without damage to nail, initial encounter: Secondary | ICD-10-CM | POA: Insufficient documentation

## 2011-06-19 DIAGNOSIS — I10 Essential (primary) hypertension: Secondary | ICD-10-CM | POA: Insufficient documentation

## 2011-06-26 LAB — DIFFERENTIAL
Basophils Absolute: 0 10*3/uL (ref 0.0–0.1)
Basophils Relative: 0 % (ref 0–1)
Eosinophils Relative: 2 % (ref 0–5)
Monocytes Absolute: 0.4 10*3/uL (ref 0.1–1.0)
Neutro Abs: 6.8 10*3/uL (ref 1.7–7.7)

## 2011-06-26 LAB — CBC
HCT: 34.7 % — ABNORMAL LOW (ref 36.0–46.0)
Hemoglobin: 11.2 g/dL — ABNORMAL LOW (ref 12.0–15.0)
MCHC: 32.2 g/dL (ref 30.0–36.0)
Platelets: 237 10*3/uL (ref 150–400)
RDW: 17.8 % — ABNORMAL HIGH (ref 11.5–15.5)

## 2011-06-26 LAB — URINALYSIS, ROUTINE W REFLEX MICROSCOPIC
Ketones, ur: NEGATIVE mg/dL
Nitrite: NEGATIVE
Protein, ur: NEGATIVE mg/dL
Urobilinogen, UA: 0.2 mg/dL (ref 0.0–1.0)

## 2011-06-26 LAB — POCT I-STAT, CHEM 8
Calcium, Ion: 1.15 mmol/L (ref 1.12–1.32)
HCT: 36 % (ref 36.0–46.0)
TCO2: 25 mmol/L (ref 0–100)

## 2011-09-27 ENCOUNTER — Emergency Department (HOSPITAL_COMMUNITY)
Admission: EM | Admit: 2011-09-27 | Discharge: 2011-09-27 | Disposition: A | Discharge status: Home / Self Care | Payer: Self-pay | Attending: Emergency Medicine | Admitting: Emergency Medicine

## 2011-09-27 ENCOUNTER — Encounter (HOSPITAL_COMMUNITY): Payer: Self-pay | Admitting: *Deleted

## 2011-09-27 DIAGNOSIS — I10 Essential (primary) hypertension: Secondary | ICD-10-CM | POA: Insufficient documentation

## 2011-09-27 DIAGNOSIS — K0889 Other specified disorders of teeth and supporting structures: Secondary | ICD-10-CM

## 2011-09-27 DIAGNOSIS — K029 Dental caries, unspecified: Secondary | ICD-10-CM | POA: Insufficient documentation

## 2011-09-27 DIAGNOSIS — K089 Disorder of teeth and supporting structures, unspecified: Secondary | ICD-10-CM | POA: Insufficient documentation

## 2011-09-27 DIAGNOSIS — Z8611 Personal history of tuberculosis: Secondary | ICD-10-CM | POA: Insufficient documentation

## 2011-09-27 HISTORY — DX: Essential (primary) hypertension: I10

## 2011-09-27 MED ORDER — HYDROMORPHONE HCL 4 MG PO TABS: 4.0000 mg | ORAL_TABLET | ORAL | Status: AC | PRN

## 2011-09-27 MED ORDER — PENICILLIN V POTASSIUM 500 MG PO TABS: 1000.0000 mg | ORAL_TABLET | Freq: Two times a day (BID) | ORAL | Status: AC

## 2011-09-27 NOTE — ED Provider Notes (Signed)
History     CSN: [id number]  Arrival date & time 09/27/11  1532   First MD Initiated Contact with Patient 09/27/11 1914      Chief Complaint  Patient presents with  . Mouth Lesions    (Consider location/radiation/quality/duration/timing/severity/associated sxs/prior treatment) HPI This 46 female has chronic dental pain she has had multiple dental extractions and still is multiple dental caries with poor dentition she now is few days if worse than usual pain to the left maxillary region by her few remaining teeth in that area without posturing and sure swelling or fever or trauma. She is no headache no neck swelling no stridor no drooling no trismus. She is no chest pain cough shortness breath abdominal pain or vomiting. She is no neck pain or back pain. The pain is severe she is no treatment prior to arrival other than her baseline methadone for chronic pain which is helping her acute pain exacerbation. Past Medical History  Diagnosis Date  . Tuberculosis     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 age 42, now s/p 1-yr of multidrug treatment (dates unknown)  . Hypertension     Past Surgical History  Procedure Date  . Cesarean section   . Left salpingectomy March 2010    ectopic pregnancy    Family History  Problem Relation Age of Onset  . Lung cancer Mother   . Lung cancer Father     History  Substance Use Topics  . Smoking status: Current Everyday Smoker -- 1.0 packs/day for 20 years  . Smokeless tobacco: Not on file  . Alcohol Use: No    OB History    Grav Para Term Preterm Abortions TAB SAB Ect Mult Living                  Review of Systems  Constitutional: Negative for fever.       10 Systems reviewed and are negative for acute change except as noted in the HPI.  HENT: Negative for congestion.   Eyes: Negative for discharge and redness.  Respiratory: Negative for cough and shortness of breath.   Cardiovascular: Negative for chest pain.  Gastrointestinal:  Negative for vomiting and abdominal pain.  Musculoskeletal: Negative for back pain.  Skin: Negative for rash.  Neurological: Negative for syncope, numbness and headaches.  Psychiatric/Behavioral:       No behavior change.    Allergies  Review of patient's allergies indicates no known allergies.  Home Medications   Current Outpatient Rx  Name Route Sig Dispense Refill  . METHADONE HCL 5 MG/5ML PO SOLN Oral Take 60 mg by mouth.     Marland Kitchen HYDROMORPHONE HCL 4 MG PO TABS Oral Take 1 tablet (4 mg total) by mouth every 4 (four) hours as needed for pain. 10 tablet 0  . PENICILLIN V POTASSIUM 500 MG PO TABS Oral Take 2 tablets (1,000 mg total) by mouth 2 (two) times daily. X 7 days 28 tablet 0    BP 146/92  Pulse 82  Temp(Src) 98.6 F (37 C) (Oral)  Resp 20  SpO2 99%  LMP 08/27/2011  Physical Exam  Nursing note and vitals reviewed. Constitutional:       Awake, alert, nontoxic appearance.  HENT:  Head: Atraumatic.  Mouth/Throat: Oropharynx is clear and moist. No oropharyngeal exudate.       Poor dentition with multiple caries the left maxillary region by a remaining few teeth is diffusely tender without fluctuance or purulent drainage there is no stridor no drooling no  trismus and her neck is nontender  Eyes: Conjunctivae are normal. Pupils are equal, round, and reactive to light. Right eye exhibits no discharge. Left eye exhibits no discharge.  Neck: Normal range of motion. Neck supple.  Cardiovascular: Normal rate and regular rhythm.   No murmur heard. Pulmonary/Chest: Effort normal and breath sounds normal. No respiratory distress. She has no wheezes. She has no rales. She exhibits no tenderness.  Abdominal: Soft. There is no tenderness. There is no rebound.  Musculoskeletal: She exhibits no tenderness.       Baseline ROM, no obvious new focal weakness.  Lymphadenopathy:    She has no cervical adenopathy.  Neurological:       Mental status and motor strength appears baseline for  patient and situation.  Skin: No rash noted.  Psychiatric: She has a normal mood and affect.    ED Course  Procedures (including critical care time)  Labs Reviewed - No data to display No results found.   1. Pain, dental   2. Dental caries       MDM          Hurman Horn, MD 09/27/11 2138

## 2011-09-27 NOTE — ED Notes (Signed)
She has had mouth lesions and tooth pain for 6 months but the pain is worse for 3 days

## 2011-10-16 ENCOUNTER — Encounter (HOSPITAL_COMMUNITY): Payer: Self-pay

## 2011-10-16 ENCOUNTER — Inpatient Hospital Stay (HOSPITAL_COMMUNITY)
Admission: EM | Admit: 2011-10-16 | Discharge: 2011-10-22 | DRG: 581 | Disposition: A | Discharge status: Home / Self Care | Payer: Self-pay | Attending: Family Medicine | Admitting: Family Medicine

## 2011-10-16 ENCOUNTER — Emergency Department (HOSPITAL_COMMUNITY): Payer: Self-pay

## 2011-10-16 DIAGNOSIS — K759 Inflammatory liver disease, unspecified: Secondary | ICD-10-CM | POA: Diagnosis present

## 2011-10-16 DIAGNOSIS — F1120 Opioid dependence, uncomplicated: Secondary | ICD-10-CM

## 2011-10-16 DIAGNOSIS — W540XXA Bitten by dog, initial encounter: Secondary | ICD-10-CM | POA: Diagnosis present

## 2011-10-16 DIAGNOSIS — Z8611 Personal history of tuberculosis: Secondary | ICD-10-CM | POA: Diagnosis present

## 2011-10-16 DIAGNOSIS — L0291 Cutaneous abscess, unspecified: Secondary | ICD-10-CM

## 2011-10-16 DIAGNOSIS — L0390 Cellulitis, unspecified: Secondary | ICD-10-CM

## 2011-10-16 DIAGNOSIS — B1920 Unspecified viral hepatitis C without hepatic coma: Secondary | ICD-10-CM | POA: Diagnosis present

## 2011-10-16 DIAGNOSIS — B182 Chronic viral hepatitis C: Secondary | ICD-10-CM | POA: Diagnosis present

## 2011-10-16 DIAGNOSIS — T148XXA Other injury of unspecified body region, initial encounter: Secondary | ICD-10-CM

## 2011-10-16 DIAGNOSIS — K739 Chronic hepatitis, unspecified: Secondary | ICD-10-CM

## 2011-10-16 DIAGNOSIS — IMO0002 Reserved for concepts with insufficient information to code with codable children: Principal | ICD-10-CM | POA: Diagnosis present

## 2011-10-16 DIAGNOSIS — S51809A Unspecified open wound of unspecified forearm, initial encounter: Secondary | ICD-10-CM | POA: Diagnosis present

## 2011-10-16 DIAGNOSIS — I10 Essential (primary) hypertension: Secondary | ICD-10-CM | POA: Diagnosis present

## 2011-10-16 DIAGNOSIS — Y998 Other external cause status: Secondary | ICD-10-CM

## 2011-10-16 HISTORY — DX: Opioid dependence, uncomplicated: F11.20

## 2011-10-16 HISTORY — DX: Abnormal cytological findings in specimens from other organs, systems and tissues: R89.6

## 2011-10-16 HISTORY — DX: Other specified abnormal immunological findings in serum: R76.8

## 2011-10-16 HISTORY — DX: Reserved for inherently not codable concepts without codable children: IMO0001

## 2011-10-16 MED ORDER — OXYCODONE-ACETAMINOPHEN 5-325 MG PO TABS
1.0000 | ORAL_TABLET | Freq: Once | ORAL | Status: AC
  Administered 2011-10-16: 1 via ORAL
  Filled 2011-10-16: qty 1

## 2011-10-16 MED ORDER — HYDROMORPHONE HCL PF 1 MG/ML IJ SOLN
1.0000 mg | Freq: Once | INTRAMUSCULAR | Status: AC
  Administered 2011-10-16: 1 mg via INTRAVENOUS
  Filled 2011-10-16: qty 1

## 2011-10-16 MED ORDER — SULFAMETHOXAZOLE-TMP DS 800-160 MG PO TABS
2.0000 | ORAL_TABLET | Freq: Once | ORAL | Status: AC
  Administered 2011-10-16: 2 via ORAL
  Filled 2011-10-16: qty 2

## 2011-10-16 MED ORDER — HYDROMORPHONE HCL PF 1 MG/ML IJ SOLN
1.0000 mg | INTRAMUSCULAR | Status: DC | PRN
  Administered 2011-10-17: 1 mg via INTRAVENOUS
  Filled 2011-10-16: qty 1

## 2011-10-16 MED ORDER — SODIUM CHLORIDE 0.9 % IV BOLUS (SEPSIS): 1000.0000 mL | Freq: Once | INTRAVENOUS | Status: DC

## 2011-10-16 MED ORDER — SULFAMETHOXAZOLE-TMP DS 800-160 MG PO TABS
1.0000 | ORAL_TABLET | Freq: Two times a day (BID) | ORAL | Status: DC
  Administered 2011-10-17: 1 via ORAL
  Filled 2011-10-16 (×3): qty 1

## 2011-10-16 MED ORDER — DEXTROSE 5 % IV SOLN
1.0000 g | Freq: Two times a day (BID) | INTRAVENOUS | Status: DC
  Administered 2011-10-17: 1 g via INTRAVENOUS
  Filled 2011-10-16 (×2): qty 10

## 2011-10-16 MED ORDER — DEXTROSE 5 % IV SOLN
1.0000 g | Freq: Once | INTRAVENOUS | Status: AC
  Administered 2011-10-16: 1 g via INTRAVENOUS
  Filled 2011-10-16: qty 10

## 2011-10-16 NOTE — ED Notes (Signed)
Pt provided ginger ale.  

## 2011-10-16 NOTE — ED Notes (Signed)
Pt c/o increased pain.

## 2011-10-16 NOTE — ED Provider Notes (Signed)
History     CSN: [id number]  Arrival date & time 10/16/11  1231   First MD Initiated Contact with Patient 10/16/11 1343      Chief Complaint  Patient presents with  . Arm Pain    Dog bite 3 days ago    (Consider location/radiation/quality/duration/timing/severity/associated sxs/prior treatment) HPI History provided by pt.   Pt was bitten on L forearm by her friend's dog 3 days ago.  Cleaned immediately w/ tap water and hydrogen peroxide.  Yesterday she developed edema, erythema and severe pain of arm.  No drainage from wounds.  Had a temp of 101 at the methadone clinic this morning where she has been dosed daily for the past 10 years for chronic back pain.  The dog's vaccinations are up to date and pt has had a tetanus w/in last 5 years.    Past Medical History  Diagnosis Date  . Tuberculosis     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 age 62, now s/p 1-yr of multidrug treatment (dates unknown)  . Hypertension     Past Surgical History  Procedure Date  . Cesarean section   . Left salpingectomy March 2010    ectopic pregnancy    Family History  Problem Relation Age of Onset  . Lung cancer Mother   . Lung cancer Father     History  Substance Use Topics  . Smoking status: Current Everyday Smoker -- 1.0 packs/day for 20 years  . Smokeless tobacco: Not on file  . Alcohol Use: No    OB History    Grav Para Term Preterm Abortions TAB SAB Ect Mult Living                  Review of Systems  All other systems reviewed and are negative.    Allergies  Review of patient's allergies indicates no known allergies.  Home Medications   Current Outpatient Rx  Name Route Sig Dispense Refill  . METHADONE HCL 5 MG/5ML PO SOLN Oral Take 60 mg by mouth.       BP 136/92  Pulse 85  Temp(Src) 98.3 F (36.8 C) (Oral)  Resp 16  SpO2 96%  LMP 10/09/2011  Physical Exam  Nursing note and vitals reviewed. Constitutional: She is oriented to person, place, and time. She  appears well-developed and well-nourished. No distress.  HENT:  Head: Normocephalic and atraumatic.  Eyes:       Normal appearance  Neck: Normal range of motion.  Cardiovascular: Normal rate and regular rhythm.   Pulmonary/Chest: Effort normal and breath sounds normal.  Musculoskeletal:       Diffuse erythema, mild edema tenderness to light palpation of extensor surface of left forearm.  2 superficial, scabbed lesion distal forearm.   Neurological: She is alert and oriented to person, place, and time.  Skin: Skin is warm and dry. No rash noted.  Psychiatric: She has a normal mood and affect. Her behavior is normal.    ED Course  Procedures (including critical care time)  Labs Reviewed - No data to display Dg Forearm Left  10/16/2011  *RADIOLOGY REPORT*  Clinical Data: Question foreign body distal forearm  LEFT FOREARM - 2 VIEW  Comparison: None.  Findings: Four views of the left radius and ulna submitted.  No acute fracture or subluxation. Dorsal diffuse mild soft tissue swelling.  No periosteal reaction or bony erosion.  No radiopaque foreign body.  IMPRESSION: No acute fracture or subluxation.  No radiopaque foreign body is identified.  Original  Report Authenticated By: Natasha Mead, M.D.     1. Cellulitis   2. Animal bite       MDM  Pt presents w/ 3 day old dog bite of left forearm w/ diffuse cellulitis.  Pt currently afebrile.  Xray neg for foreign body.  Pt has received IV rocephin as well as 2 po bactrim and IV dilaudid for pain.  Margins of erythema drawn.  Will move to CDU for second dose of abx in 12 hours.          Otilio Miu, [place] 10/16/11 1556

## 2011-10-16 NOTE — ED Provider Notes (Signed)
Patient moved to CDU under cellulitis protocol.  Patient bitten on left forearm by friend's dog several days ago.  Swelling and erythema developed today.  Patient has received first round of antibiotics--next set due at 2AM 10/17/11.  Patient continues to report significant discomfort in forearm.  Patient currently taking 60 mg methadone daily for chronic back pain.  Lungs CTA bilaterally, S1/S2, RRR, no murmur.  Abdomen soft, bowel sounds present.  Distal PMS intact x 4.  11:31 PM Change of shift report provided to Dr. Hyacinth Meeker.  Jimmye Norman, NP 10/16/11 [phone]

## 2011-10-16 NOTE — ED Notes (Signed)
Diet tray ordered for pt 

## 2011-10-16 NOTE — ED Provider Notes (Signed)
HPI: Patient seen initially by PA for pain in left arm following dog bite 3 days ago.  Patient has puncture wounds to left forearm with surrounding erythema without underlying fluctuance.  Plain film showed no gas or foreign material in her wound.  Patient without fever, nausea, vomiting, or history of DM or immunocompromise.  She has not been seen or treated for this previously.  AFVSS MSK: LUE with small puncture wounds over the dorsum of left forearm with surrounding erythema and mild swelling without induration or fluctuance  A: 46 yo F without history of significant medical problems with dog bite that now appears to have cellulitis.  P: Treat with IV Rocephin for normal skin flora as well as high dose bactrim for MRSA in CDU cellulitis protocol.  Cyndra Numbers, MD 10/17/11 1059

## 2011-10-16 NOTE — ED Provider Notes (Signed)
  Physical Exam  BP 106/70  Pulse 73  Temp(Src) 98.7 F (37.1 C) (Oral)  Resp 22  SpO2 97%  LMP 10/09/2011  Physical Exam  ED Course  Procedures  MDM Received in change of shift, patient currently on CDU protocol for cellulitis. Dog bite 3 days ago - developed cellulitis - Forearm - Rocephin and bactrim, - plan to go home in AM - 2nd dose at 2 AM.  Bactrim and Augmentin for home.  46 year old female with a history of a dog bite to the dorsum of her left wrist approximately 3 days ago. Since that time she has had gradual onset of redness and swelling and pain of the left forearm. She presented to the emergency department earlier in the evening with a complaint of increased swelling redness and pain with a fever to 101 earlier in the day. Initially the patient was placed on the CDU observational protocol for cellulitis. She was given an intravenous infusion of Rocephin as well as Bactrim. Over the last several hours her pain has continued to get worse, redness has continued to spread up the arm and she has required more frequent doses of hydromorphone for pain.   Physical exam: On repeat evaluation, the redness is starting to extend beyond the borders drawn by a nurse earlier that visit. There is erythema to the volar surface of the forearm which is spreading centimeters beyond the border. She has increased tenderness and pain with range of motion of her hand and is unable to make a fist without significant amount of pain. She has normal heart and lung sounds at this time, her mental status is clear.   Assessment: Worsening cellulitis of the left arm. Labs ordered, will require admission to the hospital. Broad spectrum antibiotics ordered including vancomycin and Zosyn.   Discussed with family practice resident who will admit.    Vida Roller, MD 10/17/11 [phone]

## 2011-10-16 NOTE — ED Notes (Signed)
Patient presents to ED for further evaluation of left arm swelling, redness and pain after a dog bite (friend's dog) 3 days ago.  Patient reports the bite was reported and dog has all of its shots.  Left arm appears swollen and red.

## 2011-10-17 ENCOUNTER — Encounter (HOSPITAL_COMMUNITY): Payer: Self-pay | Admitting: Sports Medicine

## 2011-10-17 ENCOUNTER — Inpatient Hospital Stay (HOSPITAL_COMMUNITY): Payer: Self-pay

## 2011-10-17 DIAGNOSIS — F1120 Opioid dependence, uncomplicated: Secondary | ICD-10-CM | POA: Diagnosis present

## 2011-10-17 LAB — CBC
HCT: 33.1 % — ABNORMAL LOW (ref 36.0–46.0)
MCV: 89.2 fL (ref 78.0–100.0)
Platelets: 159 10*3/uL (ref 150–400)
RBC: 3.71 MIL/uL — ABNORMAL LOW (ref 3.87–5.11)
WBC: 17.6 10*3/uL — ABNORMAL HIGH (ref 4.0–10.5)

## 2011-10-17 LAB — DIFFERENTIAL
Eosinophils Relative: 0 % (ref 0–5)
Lymphocytes Relative: 8 % — ABNORMAL LOW (ref 12–46)
Lymphs Abs: 1.4 10*3/uL (ref 0.7–4.0)
Monocytes Absolute: 0.9 10*3/uL (ref 0.1–1.0)

## 2011-10-17 LAB — COMPREHENSIVE METABOLIC PANEL
ALT: 10 U/L (ref 0–35)
CO2: 24 mEq/L (ref 19–32)
Calcium: 8.5 mg/dL (ref 8.4–10.5)
GFR calc Af Amer: >90 mL/min (ref >90)
GFR calc non Af Amer: >90 mL/min (ref >90)
Glucose, Bld: 98 mg/dL (ref 70–99)
Sodium: 133 mEq/L — ABNORMAL LOW (ref 135–145)
Total Bilirubin: 0.2 mg/dL — ABNORMAL LOW (ref 0.3–1.2)

## 2011-10-17 LAB — POCT I-STAT, CHEM 8
Calcium, Ion: 1.15 mmol/L (ref 1.12–1.32)
Chloride: 107 mEq/L (ref 96–112)
HCT: 33 % — ABNORMAL LOW (ref 36.0–46.0)
Hemoglobin: 11.2 g/dL — ABNORMAL LOW (ref 12.0–15.0)
TCO2: 23 mmol/L (ref 0–100)

## 2011-10-17 MED ORDER — MORPHINE SULFATE 4 MG/ML IJ SOLN
4.0000 mg | Freq: Once | INTRAMUSCULAR | Status: AC
  Administered 2011-10-17: 4 mg via INTRAVENOUS
  Filled 2011-10-17: qty 1

## 2011-10-17 MED ORDER — GADOBENATE DIMEGLUMINE 529 MG/ML IV SOLN
10.0000 mL | Freq: Once | INTRAVENOUS | Status: AC | PRN
  Administered 2011-10-17: 10 mL via INTRAVENOUS

## 2011-10-17 MED ORDER — METHADONE HCL 10 MG PO TABS
70.0000 mg | ORAL_TABLET | Freq: Every day | ORAL | Status: DC
  Administered 2011-10-17 – 2011-10-18 (×2): 70 mg via ORAL
  Filled 2011-10-17 (×2): qty 7

## 2011-10-17 MED ORDER — HYDROMORPHONE HCL PF 1 MG/ML IJ SOLN
1.0000 mg | INTRAMUSCULAR | Status: DC | PRN
  Administered 2011-10-17: 1 mg via INTRAVENOUS
  Filled 2011-10-17: qty 1

## 2011-10-17 MED ORDER — ACETAMINOPHEN 650 MG RE SUPP: 650.0000 mg | Freq: Four times a day (QID) | RECTAL | Status: DC | PRN

## 2011-10-17 MED ORDER — ACETAMINOPHEN 325 MG PO TABS
650.0000 mg | ORAL_TABLET | Freq: Four times a day (QID) | ORAL | Status: DC | PRN
  Administered 2011-10-17 – 2011-10-19 (×3): 650 mg via ORAL
  Filled 2011-10-17 (×3): qty 2

## 2011-10-17 MED ORDER — PIPERACILLIN-TAZOBACTAM 3.375 G IVPB
3.3750 g | Freq: Three times a day (TID) | INTRAVENOUS | Status: DC
  Administered 2011-10-17 – 2011-10-22 (×14): 3.375 g via INTRAVENOUS
  Filled 2011-10-17 (×18): qty 50

## 2011-10-17 MED ORDER — SODIUM CHLORIDE 0.9 % IV SOLN
INTRAVENOUS | Status: DC
  Administered 2011-10-17: 20 mL/h via INTRAVENOUS
  Administered 2011-10-18 – 2011-10-22 (×2): via INTRAVENOUS

## 2011-10-17 MED ORDER — HEPARIN SODIUM (PORCINE) 5000 UNIT/ML IJ SOLN
5000.0000 [IU] | Freq: Three times a day (TID) | INTRAMUSCULAR | Status: DC
  Administered 2011-10-17 – 2011-10-21 (×11): 5000 [IU] via SUBCUTANEOUS
  Filled 2011-10-17 (×15): qty 1

## 2011-10-17 MED ORDER — IBUPROFEN 600 MG PO TABS
600.0000 mg | ORAL_TABLET | Freq: Four times a day (QID) | ORAL | Status: DC | PRN
  Administered 2011-10-18 – 2011-10-22 (×11): 600 mg via ORAL
  Filled 2011-10-17 (×12): qty 1

## 2011-10-17 MED ORDER — VANCOMYCIN HCL IN DEXTROSE 1-5 GM/200ML-% IV SOLN
1000.0000 mg | Freq: Once | INTRAVENOUS | Status: AC
  Administered 2011-10-17: 1000 mg via INTRAVENOUS
  Filled 2011-10-17: qty 200

## 2011-10-17 MED ORDER — VANCOMYCIN HCL IN DEXTROSE 1-5 GM/200ML-% IV SOLN
1000.0000 mg | Freq: Two times a day (BID) | INTRAVENOUS | Status: DC
  Administered 2011-10-17 (×2): 1000 mg via INTRAVENOUS
  Filled 2011-10-17 (×4): qty 200

## 2011-10-17 MED ORDER — CLINDAMYCIN PHOSPHATE 300 MG/50ML IV SOLN
300.0000 mg | Freq: Four times a day (QID) | INTRAVENOUS | Status: DC
  Administered 2011-10-17 – 2011-10-22 (×19): 300 mg via INTRAVENOUS
  Filled 2011-10-17 (×22): qty 50

## 2011-10-17 NOTE — ED Notes (Signed)
46 year old female with a history of a dog bite to the dorsum of her left wrist approximately 3 days ago. Since that time she has had gradual onset of redness and swelling and pain of the left forearm. She presented to the emergency department earlier in the evening with a complaint of increased swelling redness and pain with a fever to 101 earlier in the day. Initially the patient was placed on the CDU observational protocol for cellulitis. She was given an intravenous infusion of Rocephin as well as Bactrim. Over the last several hours her pain has continued to get worse, redness has continued to spread up the arm and she has required more frequent doses of hydromorphone for pain.  Physical exam:  On repeat evaluation, the redness is starting to extend significantly beyond the borders drawn by a nurse earlier that visit. There is erythema to the volar surface of the forearm which is spreading centimeters beyond the border. She has increased tenderness and pain with range of motion of her hand and is unable to make a fist without significant amount of pain. She has normal heart and lung sounds at this time, her mental status is clear.  Assessment:  Worsening cellulitis of the left arm. Labs ordered, will require admission to the hospital. Broad spectrum antibiotics ordered including vancomycin and Zosyn.  Discussed with family practice service, will admit to the hospital for ongoing antibiotic therapy.  Vida Roller, MD 10/18/11 [phone]

## 2011-10-17 NOTE — H&P (Signed)
FMTS Attending Admit Note  Patient seen and examined by me, discussed with resident team and I agree with assessment.  Briefly, patient is 45yoF who was bitten by a friend's dog 4 days prior to admission after taking dog toy from dog's mouth.  She states was an accidental bite, dog is known to be up to date on rabies vaccination.  Since time of admission, the area surrounding the bite on the L forearm has become more edematous and taut, with expansion of the area of erythema which is now streaking up the medial aspect of the L upper arm.  Additional exam findings by me at this time include taut anterior compartment of LUE; blanching of fingertips in L hand with exquisite tenderness to light touch, out of proportion to exam.  Palpable radial pulse is noted.   Assessment/Plan:  Patient s/p dog bite with LUE cellulitis and worsening streaking erythema.  I am concerned about the possibility of compartment syndrome of the LUE, as well as underlying tenosynovitis/osteomyelitis.   We will consult orthopedic surgery promptly for concern about compartment syndrome.  May either add flagyl to current abx regimen, versus change to ertapenem monotherapy. MRI LUE, pending discussion with ortho. Blood cultures x2 if temp spikes.  Paula Compton, M.D.

## 2011-10-17 NOTE — ED Provider Notes (Signed)
Medical screening examination/treatment/procedure(s) were conducted as a shared visit with non-physician practitioner(s) and myself.  I personally evaluated the patient during the encounter  Cyndra Numbers, MD 10/17/11 1109

## 2011-10-17 NOTE — ED Notes (Signed)
Pt states that the dog that bit her is UTD on shots - neighbors dog.  Vida Roller, MD 10/17/11 [phone]

## 2011-10-17 NOTE — Progress Notes (Signed)
PGY-1 Progress Note:  Per RN, she spoke with patient's pain clinic and the dose she is on is Methadone 70mg  daily at 6:30 am.  We will prescribe this dose ONLY while she is in the hospital. She will not be given Rx for any pain medications at discharge. After we start her daily Methadone today, her additional pain will be controlled with non-opioids.   Geneveive Furness M. Donelle Baba, M.D.

## 2011-10-17 NOTE — ED Provider Notes (Signed)
Medical screening examination/treatment/procedure(s) were conducted as a shared visit with non-physician practitioner(s) and myself.  I personally evaluated the patient during the encounter  Myeisha Kruser, MD 10/17/11 1109 

## 2011-10-17 NOTE — Progress Notes (Signed)
ANTIBIOTIC CONSULT NOTE - INITIAL  Pharmacy Consult for Vancomycin and Zosyn  Indication:  Cellulitis s/p dog bite  No Known Allergies  Patient Measurements: Height: 5\' 3"  (160 cm) Weight: 120 lb (54.432 kg) IBW/kg (Calculated) : 52.4    Vital Signs: Temp: 98.2 F (36.8 C) (01/26 0339) Temp src: Oral (01/26 0339) BP: 113/79 mmHg (01/26 0339) Pulse Rate: 73  (01/25 2321)  Labs:  Basename 10/17/11 0354 10/17/11 0337  WBC -- 17.6*  HGB 11.2* 10.9*  PLT -- 159  LABCREA -- --  CREATININE 0.80 0.73   Estimated Creatinine Clearance: 74.2 ml/min (by C-G formula based on Cr of 0.8).   Medical History: Past Medical History  Diagnosis Date  . Tuberculosis     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 age 49, now s/p 1-yr of multidrug treatment (dates unknown)  . Hypertension   . Hepatitis C antibody test positive   . ASCUS (atypical squamous cells of undetermined significance) on Pap smear   . Opioid dependence     Medications:  Prescriptions prior to admission  Medication Sig Dispense Refill  . methadone (DOLOPHINE) 5 MG/5ML solution Take 60 mg by mouth.        Assessment: 46 yo female with cellulitis for empiric antibiotics.  Vancomycin 1 g IV given in ED at 0340 today.  Goal of Therapy:  Vancomycin trough level 10-15 mcg/ml  Plan:  Vancomycin1  g IVq12h Zosyn 3.375 g IV q8h  Kristena Wilhelmi, Gary Fleet 10/17/2011,7:51 AM

## 2011-10-17 NOTE — ED Notes (Signed)
Hospitalist at bedside 

## 2011-10-17 NOTE — H&P (Signed)
Family Medicine Teaching Service HISTORY & PHYSICAL   Patient name: Ariana Jensen Medical record number: [id number] Date of birth: 1965-12-15 Age: 46 y.o. Gender: female  Primary Care Provider: No primary provider on file.  CODE STATUS: Full   Chief Complaint: Dog Bite    HPI  Ariana Jensen is a 46 y.o. year old female presenting with complaints of L arm swelling and pain secondary to a dog bite received on 10/13/2011.  She states that it was an unknown type of small dog that is up to date on it's vaccinations.  She initially thought the wound was fairly insignificant and was keeping it clean with soap and water.  2 days following the bite she started developing swelling and worsening pain.  On Day 3 she reports being febrile to 101 while receiving her daily methadone from her methadone clinic.  Following her morning dose the pain progressive worsenend and she felt that she needed further evaluation in the ED.  She was started on Rocephin and Bactrim and placed in the CDU and was followed clinically.  She demonstrated signs of worsening infection with progressive swelling and erythema as well as increasing pain requiring increasing doses of Dilaudid.  FMTS was consulted for unassigned and was asked to admit this patient for further management.     ROS   Constitutional No changes in behaviors  Infectious No prior infections, no similar skin infections, no recent URI  Resp No cough  Cardiac No CP, no palpitations  GI No N/V, no changes in bowel habits  Skin As above, worsening erythema, swelling and pain on L UE  Psych + substance abuse in past, currently on methadone only  Trauma Dog bite as above but no other trauma reported              HISTORY:  PMHx:  Past Medical History  Diagnosis Date  . Tuberculosis     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 age 21, now s/p 1-yr of multidrug treatment (dates unknown)  . Hypertension   . Hepatitis C antibody test positive   . ASCUS  (atypical squamous cells of undetermined significance) on Pap smear     PSHx: Past Surgical History  Procedure Date  . Cesarean section   . Left salpingectomy March 2010    ectopic pregnancy    Social Hx: History   Social History  . Marital Status: Married    Spouse Name: N/A    Number of Children: N/A  . Years of Education: N/A   Social History Main Topics  . Smoking status: Current Everyday Smoker -- 1.0 packs/day for 20 years  . Smokeless tobacco: None  . Alcohol Use: No  . Drug Use:   . Sexually Active:    Other Topics Concern  . None   Social History Narrative  . None   Family Hx: Family History  Problem Relation Age of Onset  . Lung cancer Mother   . Lung cancer Father    Allergies: No Known Allergies  Home Medications: Prior to Admission medications   Medication Sig Start Date End Date Taking? Authorizing Provider  methadone (DOLOPHINE) 5 MG/5ML solution Take 60 mg by mouth.    Yes Historical Provider, MD             OBJECTIVE  Vitals: Patient Vitals for the past 24 hrs:  BP Temp Temp src Pulse Resp SpO2  10/17/11 0339 113/79 mmHg 98.2 F (36.8 C) Oral - - 97 %  10/16/11 2321 106/70 mmHg - -  73  22  97 %  10/16/11 1819 128/77 mmHg 98.7 F (37.1 C) Oral 83  21  99 %  10/16/11 1241 136/92 mmHg 98.3 F (36.8 C) Oral 85  16  96 %   Wt Readings from Last 3 Encounters:  No data found for Wt   No intake or output data in the 24 hours ending 10/17/11 0410  PE: GENERAL:  Adult Caucasian female examined and in [facility] ED.  In mild discomfort favoring her left upper extremity, no respiratory distress. Slightly disheveled HNEENT: AT/[place], MMM, no scleral icterus, EOMi THORAX: HEART: RRR, S1/S2 heard, no murmur LUNGS: CTA B, no wheezes, no crackles ABDOMEN:  +BS, soft, non-tender, no rigidity, no guarding, no masses/organomegaly EXTREMITIES: LE with significant swelling, erythema over L distal forearm with 2 puncture wounds spaced ~4cm apart.  Diffuse  swelling of digits with erythema spreading proximally to above the elbow.  Tenderness to palpation, pain with passive movement.  Both radial and ulnar pulses 2+/4 and sensation in tact to light touch.  No LE swelling.  LABS: LABS: Hematology:  Lab 10/17/11 0354 10/17/11 0337  WBC -- 17.6*  HGB 11.2* 10.9*  HCT 33.0* 33.1*  PLT -- 159  RDW -- 14.7  MCV -- 89.2  MCHC -- 32.9  Metabolic:  Lab 10/17/11 0354  NA 137  K 3.9  CL 107  CO2 --  BUN 20  CREATININE 0.80  GLUCOSE 103*  CALCIUM --  MG --  PHOS --   MICRO: None   IMAGING: 10/17/11 Forearm Xray: No acute fracture or subluxation. No radiopaque foreign body is identified             ASSESSMENT & PLAN  46 y.o. year old female with cellulitis secondary to dog bite with worsening swelling, erythema and pain.    1. Cellulitis:  Due to "failed" treatment of Rocephin and Bactrim pt started on Vanco while in the ED.  We will continue Vanco and start Zosyn for coverage of typical cellulitis bugs as well as dog bite associated bacteria such as pasturella, salmonella, and capnocytophagia.  Due to extent of swelling will consider MRI; stryker needle is contraindicated at this time due to infection.  Will continue to monitor closely.   *VANCO *ZOSYN  2. Opioid Dependence:  Pt is seen at the methadone clinic daily and reports having weaned from 125mg  q day to 60mg  qday.  We will contact them when they open and have them fax her current dose to [place] so she may be maintained while acutely hospitalized.   [ ]  f/u Methadone   3. Health Maintenance (Hx of ASCUS [Oct '10], Hx of +HEP C Ab [July '12], Hx of TB):  Pt will need importance of chronic ongoing medical care emphasized at time of discharge.  Will need follow up PAP to be scheduled.  Will obtain Hep C PCR. Imaging in July '12 showed no X-Ray findings of active TB.  [ ]  Hep C PCR  --- FEN/GI: Regular diet --- IVFs: NS at [facility]  --- PPx: Heparin            DISPOSITION  Will admit  to follow clinical findings.  Plan for d/c following clinical improvement and tolerating PO ABX well.   Gaspar Bidding, DO [facility] Resident - PGY-1 10/17/2011 4:10 AM  ----------------------------------------------------------------------------------------------------------------------------------------------------------------------  R3 Note  Pt seen and examined and agree with above. Briefly, this is a 46 year old female with past medical history of IV drug abuse, hepatitis C  antibody positive, as well as chronic methadone use who presents with a chief complaint cellulitis. Patient states she was bitten by a small dog approximately 4 days ago. Patient states bite was on on the left forearm. Patient says she otherwise followed the wound over 3-4 day. Patient states she then noticed significant swelling and redness approximately one day prior to presentation. Patient states she noticed a temperature of up to 101 at methadone clinic and subsequently became in to the ED for evaluation. While in ED patient was placed on cellulitis protocol were patient received Rocephin IV as well as Bactrim for treatment. Patient was placed in observation in the CDU for 18 hours and the patient was reported to have a failure treatment secondary to progressive redness and pain. Per report patient's chief complaint was pain and requesting a methadone. Patient states she has never had an episode of this before in the past. Patient denies any purulent drainage.  Gen: In bed, in minimal distress secondary to pain HEENT: NCAT, EOMI, no scleral icterus, poor dentition CV: RRR, no murmurs PULM: CTAB, no wheezes ABD: S/NT/+ bowel sounds  EXT: L forearm    Labs:  Pending    A/P 73 YOF with dog bite cellulitis.  Cellulitis: Will broaden coverage to vanc and zosyn to cover for MRSA and pasteurella coverage. Tetanus shot as well. L upper extremity is noted to be markedly tight on exam, though with good  distal pulses. Will continue to watch closely.  Low threshold for stryker testing to r/o compartment syndrome +/- MRI to evaluate for extent of cellulitis.  Hepatitis C Ab Positive: Will check hepatitis PCR to correlate.  Hx/o TB: Exposed as a child. Will check CXR to assess for active infection Opiod Dependece: Prior history of IV drug use greater than 10 years ago. No visible tract marks or other signs of overt IV drug use. Will contact Methadone clinic in am to assess for accurate dosing.  FEN/GI: regular diet/IVF @ [facility] PPx: Subq heparin Dispo: Pending further evaluation.

## 2011-10-18 ENCOUNTER — Inpatient Hospital Stay (HOSPITAL_COMMUNITY): Payer: Self-pay | Admitting: Anesthesiology

## 2011-10-18 ENCOUNTER — Encounter (HOSPITAL_COMMUNITY)
Admission: EM | Disposition: A | Discharge status: Home / Self Care | Payer: Self-pay | Source: Home / Self Care | Attending: Family Medicine

## 2011-10-18 ENCOUNTER — Encounter (HOSPITAL_COMMUNITY): Payer: Self-pay | Admitting: Anesthesiology

## 2011-10-18 HISTORY — PX: I&D EXTREMITY: SHX5045

## 2011-10-18 LAB — CBC
HCT: 28.9 % — ABNORMAL LOW (ref 36.0–46.0)
Hemoglobin: 9.3 g/dL — ABNORMAL LOW (ref 12.0–15.0)
MCH: 28.7 pg (ref 26.0–34.0)
MCHC: 32.2 g/dL (ref 30.0–36.0)
MCV: 89.2 fL (ref 78.0–100.0)
Platelets: 141 K/uL — ABNORMAL LOW (ref 150–400)
RBC: 3.24 MIL/uL — ABNORMAL LOW (ref 3.87–5.11)
RDW: 14.6 % (ref 11.5–15.5)
WBC: 10.8 K/uL — ABNORMAL HIGH (ref 4.0–10.5)

## 2011-10-18 LAB — BASIC METABOLIC PANEL
BUN: 13 mg/dL (ref 6–23)
CO2: 22 mEq/L (ref 19–32)
Calcium: 8.2 mg/dL — ABNORMAL LOW (ref 8.4–10.5)
Chloride: 103 mEq/L (ref 96–112)
Creatinine, Ser: 0.63 mg/dL (ref 0.50–1.10)
GFR calc Af Amer: >90 mL/min (ref >90)
GFR calc non Af Amer: >90 mL/min (ref >90)
Glucose, Bld: 82 mg/dL (ref 70–99)
Potassium: 3.7 mEq/L (ref 3.5–5.1)
Sodium: 134 mEq/L — ABNORMAL LOW (ref 135–145)

## 2011-10-18 LAB — SURGICAL PCR SCREEN
MRSA, PCR: NEGATIVE
Staphylococcus aureus: NEGATIVE

## 2011-10-18 SURGERY — IRRIGATION AND DEBRIDEMENT EXTREMITY
Anesthesia: General | Site: Arm Lower | Laterality: Left | Wound class: Dirty or Infected

## 2011-10-18 MED ORDER — PROPOFOL 10 MG/ML IV EMUL
INTRAVENOUS | Status: DC | PRN
  Administered 2011-10-18: 150 mg via INTRAVENOUS

## 2011-10-18 MED ORDER — MORPHINE SULFATE 2 MG/ML IJ SOLN
2.0000 mg | INTRAMUSCULAR | Status: DC | PRN
  Administered 2011-10-18 (×6): 2 mg via INTRAVENOUS

## 2011-10-18 MED ORDER — MORPHINE SULFATE 4 MG/ML IJ SOLN
INTRAMUSCULAR | Status: AC
  Filled 2011-10-18: qty 1

## 2011-10-18 MED ORDER — SODIUM CHLORIDE 0.9 % IV SOLN
750.0000 mg | Freq: Two times a day (BID) | INTRAVENOUS | Status: DC
  Administered 2011-10-18 – 2011-10-22 (×8): 750 mg via INTRAVENOUS
  Filled 2011-10-18 (×9): qty 750

## 2011-10-18 MED ORDER — HYDROMORPHONE HCL PF 1 MG/ML IJ SOLN
0.2500 mg | INTRAMUSCULAR | Status: DC | PRN
  Administered 2011-10-18 (×4): 0.5 mg via INTRAVENOUS

## 2011-10-18 MED ORDER — MIDAZOLAM HCL 5 MG/5ML IJ SOLN
INTRAMUSCULAR | Status: DC | PRN
  Administered 2011-10-18: 2 mg via INTRAVENOUS

## 2011-10-18 MED ORDER — MORPHINE SULFATE 2 MG/ML IJ SOLN
INTRAMUSCULAR | Status: AC
  Filled 2011-10-18: qty 1

## 2011-10-18 MED ORDER — ONDANSETRON HCL 4 MG/2ML IJ SOLN: 4.0000 mg | Freq: Once | INTRAMUSCULAR | Status: DC | PRN

## 2011-10-18 MED ORDER — HYDROMORPHONE HCL PF 1 MG/ML IJ SOLN
INTRAMUSCULAR | Status: AC
  Filled 2011-10-18: qty 1

## 2011-10-18 MED ORDER — MORPHINE SULFATE 2 MG/ML IJ SOLN
1.0000 mg | INTRAMUSCULAR | Status: DC | PRN
  Administered 2011-10-18 – 2011-10-20 (×10): 1 mg via INTRAVENOUS
  Filled 2011-10-18 (×11): qty 1

## 2011-10-18 MED ORDER — SODIUM CHLORIDE 0.9 % IR SOLN
Status: DC | PRN
  Administered 2011-10-18: 3000 mL

## 2011-10-18 MED ORDER — LACTATED RINGERS IV SOLN
INTRAVENOUS | Status: DC | PRN
  Administered 2011-10-18: 13:00:00 via INTRAVENOUS

## 2011-10-18 MED ORDER — FENTANYL CITRATE 0.05 MG/ML IJ SOLN
INTRAMUSCULAR | Status: DC | PRN
  Administered 2011-10-18: 100 ug via INTRAVENOUS
  Administered 2011-10-18: 50 ug via INTRAVENOUS
  Administered 2011-10-18: 150 ug via INTRAVENOUS

## 2011-10-18 MED ORDER — ONDANSETRON HCL 4 MG/2ML IJ SOLN
INTRAMUSCULAR | Status: DC | PRN
  Administered 2011-10-18: 4 mg via INTRAVENOUS

## 2011-10-18 MED ORDER — MORPHINE SULFATE 2 MG/ML IJ SOLN
INTRAMUSCULAR | Status: AC
  Filled 2011-10-18: qty 3

## 2011-10-18 MED ORDER — METHADONE HCL 10 MG PO TABS
70.0000 mg | ORAL_TABLET | Freq: Every day | ORAL | Status: DC
  Administered 2011-10-19 – 2011-10-22 (×4): 70 mg via ORAL
  Filled 2011-10-18 (×4): qty 7

## 2011-10-18 MED ORDER — SODIUM CHLORIDE 0.9 % IR SOLN
Status: DC | PRN
  Administered 2011-10-18: 1000 mL

## 2011-10-18 SURGICAL SUPPLY — 59 items
BANDAGE CONFORM 2  STR LF (GAUZE/BANDAGES/DRESSINGS) IMPLANT
BANDAGE ELASTIC 3 VELCRO ST LF (GAUZE/BANDAGES/DRESSINGS) ×2 IMPLANT
BANDAGE ELASTIC 4 VELCRO ST LF (GAUZE/BANDAGES/DRESSINGS) ×4 IMPLANT
BANDAGE GAUZE ELAST BULKY 4 IN (GAUZE/BANDAGES/DRESSINGS) ×4 IMPLANT
BNDG CMPR 9X4 STRL LF SNTH (GAUZE/BANDAGES/DRESSINGS) ×1
BNDG COHESIVE 1X5 TAN STRL LF (GAUZE/BANDAGES/DRESSINGS) IMPLANT
BNDG ESMARK 4X9 LF (GAUZE/BANDAGES/DRESSINGS) ×2 IMPLANT
CLOTH BEACON ORANGE TIMEOUT ST (SAFETY) ×2 IMPLANT
CORDS BIPOLAR (ELECTRODE) ×2 IMPLANT
COVER SURGICAL LIGHT HANDLE (MISCELLANEOUS) ×2 IMPLANT
CUFF TOURNIQUET SINGLE 18IN (TOURNIQUET CUFF) ×2 IMPLANT
CUFF TOURNIQUET SINGLE 24IN (TOURNIQUET CUFF) IMPLANT
DRAIN PENROSE 1/4X12 LTX STRL (WOUND CARE) ×2 IMPLANT
DRAPE SURG 17X23 STRL (DRAPES) ×2 IMPLANT
DRSG ADAPTIC 3X8 NADH LF (GAUZE/BANDAGES/DRESSINGS) ×2 IMPLANT
DRSG EMULSION OIL 3X3 NADH (GAUZE/BANDAGES/DRESSINGS) ×2 IMPLANT
ELECT REM PT RETURN 9FT ADLT (ELECTROSURGICAL) ×2
ELECTRODE REM PT RTRN 9FT ADLT (ELECTROSURGICAL) ×1 IMPLANT
GAUZE KERLIX 2  STERILE LF (GAUZE/BANDAGES/DRESSINGS) ×2 IMPLANT
GAUZE SPONGE 4X4 12PLY STRL LF (GAUZE/BANDAGES/DRESSINGS) ×2 IMPLANT
GAUZE XEROFORM 1X8 LF (GAUZE/BANDAGES/DRESSINGS) ×2 IMPLANT
GAUZE XEROFORM 5X9 LF (GAUZE/BANDAGES/DRESSINGS) IMPLANT
GLOVE BIO SURGEON STRL SZ7 (GLOVE) ×6 IMPLANT
GLOVE BIOGEL PI IND STRL 8.5 (GLOVE) ×1 IMPLANT
GLOVE BIOGEL PI INDICATOR 8.5 (GLOVE) ×1
GLOVE SURG ORTHO 8.0 STRL STRW (GLOVE) ×2 IMPLANT
GOWN PREVENTION PLUS XLARGE (GOWN DISPOSABLE) ×2 IMPLANT
GOWN STRL NON-REIN LRG LVL3 (GOWN DISPOSABLE) ×2 IMPLANT
HANDPIECE INTERPULSE COAX TIP (DISPOSABLE) ×2
KIT BASIN OR (CUSTOM PROCEDURE TRAY) ×2 IMPLANT
KIT ROOM TURNOVER OR (KITS) ×2 IMPLANT
MANIFOLD NEPTUNE II (INSTRUMENTS) ×2 IMPLANT
NEEDLE HYPO 25GX1X1/2 BEV (NEEDLE) IMPLANT
NS IRRIG 1000ML POUR BTL (IV SOLUTION) ×2 IMPLANT
PACK ORTHO EXTREMITY (CUSTOM PROCEDURE TRAY) ×2 IMPLANT
PAD ARMBOARD 7.5X6 YLW CONV (MISCELLANEOUS) ×4 IMPLANT
PAD CAST 4YDX4 CTTN HI CHSV (CAST SUPPLIES) ×2 IMPLANT
PADDING CAST COTTON 4X4 STRL (CAST SUPPLIES) ×4
PADDING WEBRIL 4 STERILE (GAUZE/BANDAGES/DRESSINGS) ×2 IMPLANT
SET HNDPC FAN SPRY TIP SCT (DISPOSABLE) ×1 IMPLANT
SOAP 2 % CHG 4 OZ (WOUND CARE) ×2 IMPLANT
SPLINT FIBERGLASS 3X35 (CAST SUPPLIES) ×2 IMPLANT
SPONGE GAUZE 4X4 12PLY (GAUZE/BANDAGES/DRESSINGS) ×2 IMPLANT
SPONGE LAP 18X18 X RAY DECT (DISPOSABLE) IMPLANT
SPONGE LAP 4X18 X RAY DECT (DISPOSABLE) IMPLANT
SUCTION FRAZIER TIP 10 FR DISP (SUCTIONS) ×2 IMPLANT
SUT ETHILON 4 0 PS 2 18 (SUTURE) IMPLANT
SUT ETHILON 5 0 P 3 18 (SUTURE) ×1
SUT NYLON ETHILON 5-0 P-3 1X18 (SUTURE) ×1 IMPLANT
SUT PROLENE 3 0 PS 2 (SUTURE) ×8 IMPLANT
SWAB CULTURE LIQUID MINI MALE (MISCELLANEOUS) ×4 IMPLANT
SYR CONTROL 10ML LL (SYRINGE) IMPLANT
TOWEL OR 17X24 6PK STRL BLUE (TOWEL DISPOSABLE) ×2 IMPLANT
TOWEL OR 17X26 10 PK STRL BLUE (TOWEL DISPOSABLE) ×2 IMPLANT
TUBE ANAEROBIC SPECIMEN COL (MISCELLANEOUS) ×2 IMPLANT
TUBE CONNECTING 12X1/4 (SUCTIONS) ×2 IMPLANT
UNDERPAD 30X30 INCONTINENT (UNDERPADS AND DIAPERS) ×2 IMPLANT
WATER STERILE IRR 1000ML POUR (IV SOLUTION) ×2 IMPLANT
YANKAUER SUCT BULB TIP NO VENT (SUCTIONS) IMPLANT

## 2011-10-18 NOTE — Consult Note (Signed)
Reason for Consult:LEFT ARM INFECTION Referring Physician: FAMILY PRACTICE SERVICE  Ariana Jensen is an 46 y.o. female.  HPI: AS NOTED IN CHART AND REVIEWED PT WITH H/O DOG BITE AND ADMITTED BY FAMILY PRACTICE PT NOT RESPONDING TO IV ANTIBIOTICS WORSENING SWELLING AND REDNESS  HAD MRI DEMONSTRATING ABSCESS COLLECTION  Past Medical History  Diagnosis Date  . Tuberculosis     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 age 37, now s/p 1-yr of multidrug treatment (dates unknown)  . Hypertension   . Hepatitis C antibody test positive   . ASCUS (atypical squamous cells of undetermined significance) on Pap smear   . Opioid dependence     Past Surgical History  Procedure Date  . Cesarean section   . Left salpingectomy March 2010    ectopic pregnancy  . Dilation and curettage of uterus     Family History  Problem Relation Age of Onset  . Lung cancer Mother   . Lung cancer Father     Social History:  reports that she has been smoking.  She does not have any smokeless tobacco history on file. She reports that she does not drink alcohol or use illicit drugs.  Allergies: No Known Allergies  Medications: I have reviewed the patient's current medications.  Results for orders placed during the hospital encounter of 10/16/11 (from the past 48 hour(s))  CBC     Status: Abnormal   Collection Time   10/17/11  3:37 AM      Component Value Range Comment   WBC 17.6 (*) 4.0 - 10.5 (K/uL)    RBC 3.71 (*) 3.87 - 5.11 (MIL/uL)    Hemoglobin 10.9 (*) 12.0 - 15.0 (g/dL)    HCT 16.1 (*) 09.6 - 46.0 (%)    MCV 89.2  78.0 - 100.0 (fL)    MCH 29.4  26.0 - 34.0 (pg)    MCHC 32.9  30.0 - 36.0 (g/dL)    RDW 04.5  40.9 - 81.1 (%)    Platelets 159  150 - 400 (K/uL)   DIFFERENTIAL     Status: Abnormal   Collection Time   10/17/11  3:37 AM      Component Value Range Comment   Neutrophils Relative 87 (*) 43 - 77 (%)    Neutro Abs 15.3 (*) 1.7 - 7.7 (K/uL)    Lymphocytes Relative 8 (*) 12 - 46 (%)      Lymphs Abs 1.4  0.7 - 4.0 (K/uL)    Monocytes Relative 5  3 - 12 (%)    Monocytes Absolute 0.9  0.1 - 1.0 (K/uL)    Eosinophils Relative 0  0 - 5 (%)    Eosinophils Absolute 0.1  0.0 - 0.7 (K/uL)    Basophils Relative 0  0 - 1 (%)    Basophils Absolute 0.0  0.0 - 0.1 (K/uL)   COMPREHENSIVE METABOLIC PANEL     Status: Abnormal   Collection Time   10/17/11  3:37 AM      Component Value Range Comment   Sodium 133 (*) 135 - 145 (mEq/L)    Potassium 3.8  3.5 - 5.1 (mEq/L)    Chloride 102  96 - 112 (mEq/L)    CO2 24  19 - 32 (mEq/L)    Glucose, Bld 98  70 - 99 (mg/dL)    BUN 20  6 - 23 (mg/dL)    Creatinine, Ser 9.14  0.50 - 1.10 (mg/dL)    Calcium 8.5  8.4 - 10.5 (mg/dL)  Total Protein 6.8  6.0 - 8.3 (g/dL)    Albumin 2.8 (*) 3.5 - 5.2 (g/dL)    AST 12  0 - 37 (U/L)    ALT 10  0 - 35 (U/L)    Alkaline Phosphatase 62  39 - 117 (U/L)    Total Bilirubin 0.2 (*) 0.3 - 1.2 (mg/dL)    GFR calc non Af Amer >90  >90 (mL/min)    GFR calc Af Amer >90  >90 (mL/min)   BILIRUBIN, DIRECT     Status: Normal   Collection Time   10/17/11  3:37 AM      Component Value Range Comment   Bilirubin, Direct <0.1  0.0 - 0.3 (mg/dL)   POCT I-STAT, CHEM 8     Status: Abnormal   Collection Time   10/17/11  3:54 AM      Component Value Range Comment   Sodium 137  135 - 145 (mEq/L)    Potassium 3.9  3.5 - 5.1 (mEq/L)    Chloride 107  96 - 112 (mEq/L)    BUN 20  6 - 23 (mg/dL)    Creatinine, Ser 0.98  0.50 - 1.10 (mg/dL)    Glucose, Bld 119 (*) 70 - 99 (mg/dL)    Calcium, Ion 1.47  1.12 - 1.32 (mmol/L)    TCO2 23  0 - 100 (mmol/L)    Hemoglobin 11.2 (*) 12.0 - 15.0 (g/dL)    HCT 82.9 (*) 56.2 - 46.0 (%)   CBC     Status: Abnormal   Collection Time   10/18/11  6:45 AM      Component Value Range Comment   WBC 10.8 (*) 4.0 - 10.5 (K/uL)    RBC 3.24 (*) 3.87 - 5.11 (MIL/uL)    Hemoglobin 9.3 (*) 12.0 - 15.0 (g/dL) DELTA CHECK NOTED   HCT 28.9 (*) 36.0 - 46.0 (%)    MCV 89.2  78.0 - 100.0 (fL)    MCH  28.7  26.0 - 34.0 (pg)    MCHC 32.2  30.0 - 36.0 (g/dL)    RDW 13.0  86.5 - 78.4 (%)    Platelets 141 (*) 150 - 400 (K/uL)   BASIC METABOLIC PANEL     Status: Abnormal   Collection Time   10/18/11  6:45 AM      Component Value Range Comment   Sodium 134 (*) 135 - 145 (mEq/L)    Potassium 3.7  3.5 - 5.1 (mEq/L)    Chloride 103  96 - 112 (mEq/L)    CO2 22  19 - 32 (mEq/L)    Glucose, Bld 82  70 - 99 (mg/dL)    BUN 13  6 - 23 (mg/dL)    Creatinine, Ser 6.96  0.50 - 1.10 (mg/dL)    Calcium 8.2 (*) 8.4 - 10.5 (mg/dL)    GFR calc non Af Amer >90  >90 (mL/min)    GFR calc Af Amer >90  >90 (mL/min)     Dg Forearm Left  10/16/2011  *RADIOLOGY REPORT*  Clinical Data: Question foreign body distal forearm  LEFT FOREARM - 2 VIEW  Comparison: None.  Findings: Four views of the left radius and ulna submitted.  No acute fracture or subluxation. Dorsal diffuse mild soft tissue swelling.  No periosteal reaction or bony erosion.  No radiopaque foreign body.  IMPRESSION: No acute fracture or subluxation.  No radiopaque foreign body is identified.  Original Report Authenticated By: Natasha Mead, M.D.   Mr Forearm Left  Wo/w Cm  10/17/2011  *RADIOLOGY REPORT*  Clinical Data: Dog bite.  Left arm swelling and pain.  To evaluate for deep abscess.  MRI OF THE LEFT FOREARM WITHOUT AND WITH CONTRAST  Technique:  Multiplanar, multisequence MR imaging was performed both before and after administration of intravenous contrast.  Contrast: 10mL MULTIHANCE GADOBENATE DIMEGLUMINE 529 MG/ML IV SOLN  Comparison: Plain radiographs 10/16/2011  Findings: Imaging field of view is limited to the focal area of abnormality in the left forearm.  The patient was unable to tolerate completion of the entire sequence of images.  There is a focal skin defect along the dorsal aspect of the forearm with underlying increased T2 signal intensity and contrast enhancement throughout the posterior, medial, and lateral soft tissues.  Changes are  consistent with cellulitis and edema.  There is increased signal intensity and contrast enhancement also within the underlying extensor compartment muscles consistent with myositis.  Along the posterior medial aspect of the forearm, there is fluid tracking along the deep fascial layer superficial to the muscle compartment and measuring about 0.6 x 3 cm axial dimension. There is some peripheral enhancement around this fluid collection consistent with abscess.  No focal hypointense structures to suggest foreign bodies.  IMPRESSION: Diffuse subcutaneous soft tissue edema and enhancement consistent with cellulitis.  Focal fluid collection along the posterior medial forearm with peripheral enhancement consistent with abscess. Muscular enhancement consistent with myositis.  Original Report Authenticated By: Marlon Pel, M.D.    AS NOTED IN CHART  Blood pressure 123/73, pulse 65, temperature 98 F (36.7 C), temperature source Oral, resp. rate 18, height 5\' 3"  (1.6 m), weight 54.432 kg (120 lb), last menstrual period 10/09/2011, SpO2 98.00%.  LEFT FOREARM: SMALL PUNCTURE WOUNDS DORSUM OF FOREARM NO DRAINAGE. VERY TENDER TO PALPATION OVER POSTEROMEDIAL FOREARM EXTENDING INTO ARM PROXIMALLY COMPARTMENTS SWOLLEN BUT SOFT DOES NOT HAVE FULL DIGITAL MOBILITY GOOD RADIAL PULSE +ERYTHEMA AND LYMPHANGITIS ALONG MEDIAL ARM AND FOREARM   Assessment/Plan: LEFT FOREARM DOGBITE WITH WORSENING MYOSITIS AND ABSCESS COLLECTION  TO OR TODAY FOR INCISION AND DRAINAGE AND FOREARM DEBRIDEMENT  R/B/A DISCUSSED WITH PT AT BEDSIDE.  PT VOICED UNDERSTANDING OF PLAN CONSENT SIGNED DAY OF SURGERY PT SEEN AND EXAMINED PRIOR TO OPERATIVE PROCEDURE/DAY OF SURGERY SITE MARKED. QUESTIONS ANSWERED WILL REMAIN AN INPATIENT ON IV ABX FOLLOWING SURGERY  Sharma Covert 10/18/2011, 12:30 PM

## 2011-10-18 NOTE — Anesthesia Postprocedure Evaluation (Signed)
  Anesthesia Post-op Note  Patient: Chief Financial Officer  Procedure(s) Performed:  IRRIGATION AND DEBRIDEMENT EXTREMITY  Patient Location: PACU  Anesthesia Type: General  Level of Consciousness: awake, alert , oriented and patient cooperative  Airway and Oxygen Therapy: Patient Spontanous Breathing and Patient connected to nasal cannula oxygen  Post-op Pain: mild  Post-op Assessment: Post-op Vital signs reviewed, Patient's Cardiovascular Status Stable, Respiratory Function Stable, Patent Airway, No signs of Nausea or vomiting and Pain level controlled  Post-op Vital Signs: stable  Complications: No apparent anesthesia complications

## 2011-10-18 NOTE — Progress Notes (Signed)
Subjective: Patient continues to have pain this morning, but feels overall pain in hand and forearm has improved.  Pain and redness now extend beyond elbow into upper arm.  No fevers or chills.  No nausea or vomiting.    Objective: Vital signs in last 24 hours: Temp:  [98 F (36.7 C)-98.3 F (36.8 C)] 98 F (36.7 C) (01/27 0551) Pulse Rate:  [65-89] 65  (01/27 0551) Resp:  [18] 18  (01/27 0551) BP: (123-134)/(73-86) 123/73 mmHg (01/27 0551) SpO2:  [98 %-99 %] 98 % (01/27 0551) Weight:  [120 lb (54.432 kg)] 120 lb (54.432 kg) (01/26 0749) Weight change:  Last BM Date: 10/15/11  Intake/Output from previous day: 01/26 0701 - 01/27 0700 In: 2275 [P.O.:1320; IV Piggyback:955] Out: 800 [Urine:800] Intake/Output this shift: Total I/O In: 955 [IV Piggyback:955] Out: 400 [Urine:400]  General appearance: alert, cooperative, appears stated age and no distress Resp: clear to auscultation bilaterally Cardio: regular rate and rhythm, S1, S2 normal, no murmur, click, rub or gallop GI: soft, non-tender; bowel sounds normal; no masses,  no organomegaly Extremities: Left upper extremity with 2 puncture wounds on dorsal aspect of forearm.  +2 edema noted and extending from MCP joint to elbow.  Erythema has extended beyond previously drawn margins, and worsened on posterior portion of forearm but improved dorsally.  Patient can wiggle fingers and sensation is intact throughout arm.    Lab Results:  Basename 10/17/11 0354 10/17/11 0337  WBC -- 17.6*  HGB 11.2* 10.9*  HCT 33.0* 33.1*  PLT -- 159   BMET  Basename 10/17/11 0354 10/17/11 0337  NA 137 133*  K 3.9 3.8  CL 107 102  CO2 -- 24  GLUCOSE 103* 98  BUN 20 20  CREATININE 0.80 0.73  CALCIUM -- 8.5    Studies/Results: Dg Forearm Left  10/16/2011  *RADIOLOGY REPORT*  Clinical Data: Question foreign body distal forearm  LEFT FOREARM - 2 VIEW  Comparison: None.  Findings: Four views of the left radius and ulna submitted.  No acute  fracture or subluxation. Dorsal diffuse mild soft tissue swelling.  No periosteal reaction or bony erosion.  No radiopaque foreign body.  IMPRESSION: No acute fracture or subluxation.  No radiopaque foreign body is identified.  Original Report Authenticated By: Natasha Mead, M.D.   Mr Forearm Left Wo/w Cm  10/17/2011  *RADIOLOGY REPORT*  Clinical Data: Dog bite.  Left arm swelling and pain.  To evaluate for deep abscess.  MRI OF THE LEFT FOREARM WITHOUT AND WITH CONTRAST  Technique:  Multiplanar, multisequence MR imaging was performed both before and after administration of intravenous contrast.  Contrast: 10mL MULTIHANCE GADOBENATE DIMEGLUMINE 529 MG/ML IV SOLN  Comparison: Plain radiographs 10/16/2011  Findings: Imaging field of view is limited to the focal area of abnormality in the left forearm.  The patient was unable to tolerate completion of the entire sequence of images.  There is a focal skin defect along the dorsal aspect of the forearm with underlying increased T2 signal intensity and contrast enhancement throughout the posterior, medial, and lateral soft tissues.  Changes are consistent with cellulitis and edema.  There is increased signal intensity and contrast enhancement also within the underlying extensor compartment muscles consistent with myositis.  Along the posterior medial aspect of the forearm, there is fluid tracking along the deep fascial layer superficial to the muscle compartment and measuring about 0.6 x 3 cm axial dimension. There is some peripheral enhancement around this fluid collection consistent with abscess.  No focal  hypointense structures to suggest foreign bodies.  IMPRESSION: Diffuse subcutaneous soft tissue edema and enhancement consistent with cellulitis.  Focal fluid collection along the posterior medial forearm with peripheral enhancement consistent with abscess. Muscular enhancement consistent with myositis.  Original Report Authenticated By: Marlon Pel, M.D.     Medications: I have reviewed the patient's current medications.  Assessment/Plan: 46 y.o. year old female with cellulitis secondary to dog bite with worsening swelling, erythema and pain.  1. Cellulitis: Due to "failed" treatment of Rocephin and Bactrim pt started on Vanco while in the ED. We will continue Vanco and start Zosyn for coverage of typical cellulitis bugs as well as dog bite associated bacteria such as pasturella, salmonella, and capnocytophagia. Due to extent of swelling will consider MRI; stryker needle is contraindicated at this time due to infection. Will continue to monitor closely.  *VANCO  *ZOSYN  * Clindamycin for improved anaerobe coverage x 3 days or if improvement after surgery  -- Spoke with Hand Surgeon Dr. Orlan Leavens over the phone yesterday.  He examined patient and deemed no compartment syndrome but was concerned for deep tissue infection.  MRI has confirmed this.  Plan to continue current IV Abx regimen and likely surgical debridement today.   2. Opioid Dependence: Pt is seen at the methadone clinic daily and reports having weaned from 125mg  q day to 60mg  qday. We will contact them when they open and have them fax her current dose to [place] so she may be maintained while acutely hospitalized.  -- Currently patient is on 70 mg of Methadone a day.  We are continuing this in house.  Her pain is adequately controlled on this regimen.  3. Health Maintenance (Hx of ASCUS [Oct '10], Hx of +HEP C Ab [July '12], Hx of TB): Pt will need importance of chronic ongoing medical care emphasized at time of discharge. Will need follow up PAP to be scheduled. Will obtain Hep C PCR. Imaging in July '12 showed no X-Ray findings of active TB.  [ ]  Hep C PCR   -- Defer to outpatient management.    --- FEN/GI: Regular diet  --- IVFs: NS at [facility]  --- PPx: Heparin -- Disposition:  Pending further recommendations from hand surgery and debridement.   LOS: 2 days   Silvanna Ohmer,JEFF 10/18/2011, 6:59  AM

## 2011-10-18 NOTE — Anesthesia Procedure Notes (Addendum)
Performed by: Rosita Fire   Procedure Name: LMA Insertion Date/Time: 10/18/2011 1:33 PM Performed by: Rosita Fire Pre-anesthesia Checklist: Patient identified, Emergency Drugs available, Suction available, Patient being monitored and Timeout performed Patient Re-evaluated:Patient Re-evaluated prior to inductionPreoxygenation: Pre-oxygenation with 100% oxygen Intubation Type: IV induction LMA Size: 3.0 Placement Confirmation: positive ETCO2 and breath sounds checked- equal and bilateral Tube secured with: Tape Dental Injury: Teeth and Oropharynx as per pre-operative assessment

## 2011-10-18 NOTE — Transfer of Care (Signed)
Immediate Anesthesia Transfer of Care Note  Patient: Ariana Jensen  Procedure(s) Performed:  IRRIGATION AND DEBRIDEMENT EXTREMITY  Patient Location: PACU  Anesthesia Type: General  Level of Consciousness: awake, alert  and oriented  Airway & Oxygen Therapy: Patient Spontanous Breathing and Patient connected to nasal cannula oxygen  Post-op Assessment: Report given to PACU RN and Post -op Vital signs reviewed and stable  Post vital signs: Reviewed and stable  Complications: No apparent anesthesia complications

## 2011-10-18 NOTE — Brief Op Note (Signed)
10/16/2011 - 10/18/2011  2:51 PM  PATIENT:  Ariana Jensen  46 y.o. female  PRE-OPERATIVE DIAGNOSIS:  abscess  POST-OPERATIVE DIAGNOSIS:  Infection/abscess left forearm  PROCEDURE:  Procedure(s): IRRIGATION AND DEBRIDEMENT EXTREMITY  SURGEON:  Surgeon(s): Sharma Covert, MD  PHYSICIAN ASSISTANT:   ASSISTANTS: none   ANESTHESIA:   general  EBL:  Total I/O In: 700 [I.V.:700] Out: 220 [Urine:200; Blood:20]  BLOOD ADMINISTERED:None  DRAINS: Penrose drain in the forearm medially and laterally   LOCAL MEDICATIONS USED:  NONE  SPECIMEN:  No Specimen  DISPOSITION OF SPECIMEN:  N/A  COUNTS:  YES  TOURNIQUET:   Total Tourniquet Time Documented: Upper Arm (Left) - 51 minutes  DICTATION: .Other Dictation: Dictation Number  [id number]      PLAN OF CARE: Admit to inpatient   PATIENT DISPOSITION:  PACU - hemodynamically stable.   Delay start of Pharmacological VTE agent (>24hrs) due to surgical blood loss or risk of bleeding:  {YES/NO/NOT APPLICABLE:20182

## 2011-10-18 NOTE — Progress Notes (Signed)
Documentation:  11:14 AM 10/18/2011 Edd Arbour MD  Patient seen and examined. AOx3 and conversive normally.  Her arm is still edematous, cellulitis lessoned compared with H+P photo.  She has a good radial pulse. NVI  A/P 1. Left Arm dog bite with cellulitis and medial arm abscess per MRI. - c/w current antibiotics. - spoke with Dr. Orlan Leavens (hand) on the phone. He will take patient to OR this afternoon. - patient NPO for procedure.  11:16 AM 10/18/2011 Edd Arbour MD

## 2011-10-18 NOTE — Preoperative (Signed)
Beta Blockers   Reason not to administer Beta Blockers:Not Applicable 

## 2011-10-18 NOTE — Anesthesia Preprocedure Evaluation (Signed)
Anesthesia Evaluation  Patient identified by MRN, date of birth, ID band Patient awake    Reviewed: Allergy & Precautions, H&P , NPO status , Patient's Chart, lab work & pertinent test results  History of Anesthesia Complications (+) AWARENESS UNDER ANESTHESIA  Airway Mallampati: I TM Distance: >3 FB Neck ROM: full    Dental  (+) Partial Upper and Poor Dentition   Pulmonary Current Smoker,    Pulmonary exam normal       Cardiovascular hypertension, regular Normal    Neuro/Psych Negative Neurological ROS     GI/Hepatic negative GI ROS, Neg liver ROS,   Endo/Other  Negative Endocrine ROS  Renal/GU negative Renal ROS  Genitourinary negative   Musculoskeletal   Abdominal   Peds  Hematology  (+) anemia ,   Anesthesia Other Findings   Reproductive/Obstetrics                           Anesthesia Physical Anesthesia Plan  ASA: II  Anesthesia Plan: General   Post-op Pain Management:    Induction: Intravenous  Airway Management Planned: LMA  Additional Equipment:   Intra-op Plan:   Post-operative Plan: Extubation in OR  Informed Consent: I have reviewed the patients History and Physical, chart, labs and discussed the procedure including the risks, benefits and alternatives for the proposed anesthesia with the patient or authorized representative who has indicated his/her understanding and acceptance.     Plan Discussed with: CRNA, Anesthesiologist and Surgeon  Anesthesia Plan Comments:         Anesthesia Quick Evaluation

## 2011-10-18 NOTE — Op Note (Signed)
[name], [name] NO.:  [id number]  MEDICAL RECORD NO.:  [id number]  LOCATION:  5158                         FACILITY:  [facility]  PHYSICIAN:  Madelynn Done, MD  DATE OF BIRTH:  06-26-1966  DATE OF PROCEDURE:  10/18/2011 DATE OF DISCHARGE:                              OPERATIVE REPORT   PREOPERATIVE DIAGNOSES: 1. Left forearm abscess. 2. Left forearm dog bite.  POSTOPERATIVE DIAGNOSES: 1. Left forearm abscess. 2. Left forearm dog bite.  ATTENDING PHYSICIAN:  Madelynn Done, MD who scrubbed and present for the entire procedure.  ASSISTANT SURGEON:  None.  ANESTHESIA:  General via LMA.  SURGICAL PROCEDURE: 1. Left forearm abscess, medial abscess collection over flexor     pronator region and deep extending through the fascia. 2. Left forearm extensor fasciotomy and extensive tenosynovectomy and     excisional debridement 3. Left forearm tenosynovectomy and excisional debridement of necrotic     muscle and tendon.  SURGICAL INDICATIONS:  Ariana Jensen is a 46 year old female who had dog bite with worsening pain and discomfort was admitted to the [facility], begun on IV antibiotics.  She was not responsive to the IV antibiotic course.  Imaging was concerning for a deep space infection.  The patient recommended to undergo the above procedure.  Risks, benefits, and alternatives were discussed in detail with the patient.  Signed informed consent was obtained.  DESCRIPTION OF PROCEDURE:  The patient was properly identified in the preop holding area and a mark with a permanent marker was made on the left forearm to indicate correct operative site.  The patient was then brought back to the operating room, placed supine on anesthesia room table.  General anesthesia was administered.  The patient tolerated this well.  Well-padded tourniquet was then placed on the left brachium and sealed with 1000 drape.  The left upper extremity  was then prepped and draped in normal sterile fashion.  Time-out was called, correct site was identified, and the procedure was then begun.  Attention was then turned to the left forearm medially.  Posterior medial incisions were then made.  This was just distal to the medial epicondyle.  Dissection was then carried down through the skin and subcutaneous tissues.  The fascia was then opened up.  The patient did have a collection of fluid.  It was not grossly purulent but did appear pathological and not normal.  Wound cultures were then taken of this area.  After opening of the abscess and fluid collection, this area was thoroughly irrigated.  Attention was then turned dorsally beginning proximally over the extensor compartment. A longitudinal incision was then made.  Dissection was carried down through the skin.  The fascia was then opened up.  Fasciotomy was then carried out along the length of the forearm as gross purulence was encountered throughout the entire fourth dorsal compartment extending into the fifth dorsal compartment.  The patient did have necrotic muscle and tendon and after the forearm fasciotomies carried out, excisional debridement was then carried out sharply with knifes and rongeurs removing the purulent and devitalized tissue.  Aggressive debridement was then carried out sharply dorsally.  This appeared to be where most  of the concentration of the infection was.  Thorough wound irrigation was done throughout.  Pulsatile lavage was then done and thoroughly irrigated.  Aggressive debridement was then carried out.  After debridement, the wound was then irrigated once again and then loosely closed over Penrose drains with 3-0 Prolene sutures.  Adaptic dressing and sterile compressive bandage was then applied.  The patient was then placed in a long-arm splint, extubated, and taken to recovery room in good condition.  POSTOPERATIVE PLAN:  The patient will continue on the  inpatient service for IV antibiotics and pain control.  The patient hopefully will improve after this aggressive debridement and decompression, however, the patient may get ill given the bacterial load that was released.  The patient will be taken back to the operating room within 48-72 hours for repeat irrigation and debridement and closure.  Anticipate 1 more intervention for the aggressive debridement and then if she continues to respond, likely just on IV antibiotic regimen.  Wound cultures were taken both medially and dorsally. Again most of the purulence and gross and necrotic tissue was over the dorsal compartment, extensor compartment of the forearm.     Madelynn Done, MD     FWO/MEDQ  D:  10/18/2011  T:  10/18/2011  Job:  [id number]

## 2011-10-18 NOTE — Progress Notes (Signed)
FMTS Attending Note  Patient seen and examined today by me, discussed with resident team and I agree with current assessment and plan.  The L UE continues to have marked erythema that is extending proximally.  She had slightly improved L handgrip (2/5) and palpable radial pulses bilaterally. Somewhat improved tenderness in L wrist. MRI consistent with abscess formation L forearm.    Assess/Plan: Patient with LUE cellulitis and abscess formation following dog bite.  Hand surgery is following.  She remains on Vanc/Zosyn and Clindamycin.  SHe has remained afebrile since admission. Her WBC count is down to 10 from 17 on admission.  Agree with continuation of current abx regimen.  Blood cultures are likely of low-yield at this stage, given duration of abx treatment, but would consider if temp spikes.  Will await further surgery recs re possible OR for abscess drainage/debridement.  Paula Compton, M.D

## 2011-10-18 NOTE — Progress Notes (Signed)
ANTIBIOTIC CONSULT NOTE - INITIAL  Pharmacy Consult for Vancomycin and Zosyn  Indication:  Cellulitis s/p dog bite  No Known Allergies  Patient Measurements: Height: 5\' 3"  (160 cm) Weight: 120 lb (54.432 kg) IBW/kg (Calculated) : 52.4    Vital Signs: Temp: 98 F (36.7 C) (01/27 0551) Temp src: Oral (01/27 0551) BP: 123/73 mmHg (01/27 0551) Pulse Rate: 65  (01/27 0551)  Labs:  Basename 10/18/11 0645 10/17/11 0354 10/17/11 0337  WBC 10.8* -- 17.6*  HGB 9.3* 11.2* 10.9*  PLT 141* -- 159  LABCREA -- -- --  CREATININE 0.63 0.80 0.73   Estimated Creatinine Clearance: 74.2 ml/min (by C-G formula based on Cr of 0.63).   Medical History: Past Medical History  Diagnosis Date  . Tuberculosis     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 age 48, now s/p 1-yr of multidrug treatment (dates unknown)  . Hypertension   . Hepatitis C antibody test positive   . ASCUS (atypical squamous cells of undetermined significance) on Pap smear   . Opioid dependence     Medications:  Prescriptions prior to admission  Medication Sig Dispense Refill  . methadone (DOLOPHINE) 5 MG/5ML solution Take 60 mg by mouth.        Assessment: 47 yo female with cellulitis from dog bite.  D2 vanc/zosyn and clinda was added yesterday. Zosyn has great anaerobic coverage so clinda is prob not needed.   Goal of Therapy:  Vancomycin trough level 10-15 mcg/ml  Plan:  Change vanc to 750mg  IV q12 Zosyn 3.375 g IV q8h  Ulyses Southward Dorris 10/18/2011,8:57 AM

## 2011-10-19 ENCOUNTER — Encounter (HOSPITAL_COMMUNITY): Payer: Self-pay | Admitting: Orthopedic Surgery

## 2011-10-19 LAB — BASIC METABOLIC PANEL
BUN: 9 mg/dL (ref 6–23)
Calcium: 8.3 mg/dL — ABNORMAL LOW (ref 8.4–10.5)
GFR calc Af Amer: >90 mL/min (ref >90)
GFR calc non Af Amer: >90 mL/min (ref >90)
Potassium: 3.9 mEq/L (ref 3.5–5.1)
Sodium: 137 mEq/L (ref 135–145)

## 2011-10-19 LAB — CBC
HCT: 27.1 % — ABNORMAL LOW (ref 36.0–46.0)
MCH: 28.9 pg (ref 26.0–34.0)
MCHC: 32.1 g/dL (ref 30.0–36.0)
RDW: 14.7 % (ref 11.5–15.5)

## 2011-10-19 MED ORDER — GADOBENATE DIMEGLUMINE 529 MG/ML IV SOLN: 10.0000 mL | Freq: Once | INTRAVENOUS | Status: AC | PRN

## 2011-10-19 MED ORDER — TETANUS-DIPHTH-ACELL PERTUSSIS 5-2.5-18.5 LF-MCG/0.5 IM SUSP
0.5000 mL | Freq: Once | INTRAMUSCULAR | Status: AC
  Administered 2011-10-19: 0.5 mL via INTRAMUSCULAR
  Filled 2011-10-19: qty 0.5

## 2011-10-19 MED ORDER — GADOBENATE DIMEGLUMINE 529 MG/ML IV SOLN
10.0000 mL | Freq: Once | INTRAVENOUS | Status: AC | PRN
  Administered 2011-10-17: 10 mL via INTRAVENOUS

## 2011-10-19 NOTE — Progress Notes (Signed)
Subjective: 1 Day Post-Op Procedure(s) (LRB): IRRIGATION AND DEBRIDEMENT EXTREMITY (Left) Patient reports pain as moderate  Objective: Vital signs in last 24 hours: Temp:  [97.5 F (36.4 C)-98.3 F (36.8 C)] 97.6 F (36.4 C) (01/28 1003) Pulse Rate:  [55-81] 68  (01/28 1003) Resp:  [7-22] 17  (01/28 1003) BP: (102-161)/(66-119) 112/72 mmHg (01/28 1003) SpO2:  [90 %-100 %] 100 % (01/28 1003)  Intake/Output from previous day: 01/27 0701 - 01/28 0700 In: 1463 [I.V.:1000; IV Piggyback:463] Out: 1320 [Urine:1300; Blood:20] Intake/Output this shift:     Basename 10/19/11 0645 10/18/11 0645 10/17/11 0354 10/17/11 0337  HGB 8.7* 9.3* 11.2* 10.9*    Basename 10/19/11 0645 10/18/11 0645  WBC 7.2 10.8*  RBC 3.01* 3.24*  HCT 27.1* 28.9*  PLT 140* 141*    Basename 10/19/11 0645 10/18/11 0645  NA 137 134*  K 3.9 3.7  CL 106 103  CO2 23 22  BUN 9 13  CREATININE 0.56 0.63  GLUCOSE 76 82  CALCIUM 8.3* 8.2*   No results found for this basename: LABPT:2,INR:2 in the last 72 hours  LUE: dressing c/d/i able to flex and extend digits fingers warm well perfused    Assessment/Plan: 1 Day Post-Op Procedure(s) (LRB): IRRIGATION AND DEBRIDEMENT EXTREMITY (Left)  CONTINUE IV ANTIBIOTICS UNTIL CULTURES COME BACK  WILL TAKE PATIENT BACK TO OR 10/21/11 FOR REPEAT I/D GIVEN SEVERITY OF INFECTION CONT INPATIENT CARE IF BETTER WILL CLOSE WOUNDS AND ALLOW FOR CONVERSION TO PO ABX Thursday AND PLAN TO ALLOW TO GO HOME WILL NEED HOME HEALTH FOR DRESSING CHANGES  WILL CONTINUE TO FOLLOW.  Sharma Covert 10/19/2011, 12:41 PM

## 2011-10-19 NOTE — Progress Notes (Signed)
I discussed with Dr Hunter.  I agree with their plans documented in their  Note for today.  

## 2011-10-19 NOTE — Progress Notes (Signed)
PGY-1 Daily Progress Note Family Medicine Teaching Service Ariana Jensen. Ariana Sleigh, MD Service Pager: [phone]   Subjective: Patient says he arm feels much better today. When arm unwrapped, says erythema looks much better than yesterday as well. Patient reports pain has been better controlled on morphine 1mg  q4 with motrin than dilaudid. Since 10pm, only 2 doses of morphine noted.   Objective:  Temp:  [97.5 F (36.4 C)-98.3 F (36.8 C)] 97.7 F (36.5 C) (01/28 0525) Pulse Rate:  [55-81] 55  (01/28 0525) Resp:  [7-22] 16  (01/28 0525) BP: (102-161)/(66-119) 102/66 mmHg (01/28 0525) SpO2:  [90 %-100 %] 98 % (01/28 0525)  Intake/Output Summary (Last 24 hours) at 10/19/11 0950 Last data filed at 10/19/11 0549  Gross per 24 hour  Intake   1463 ml  Output   1320 ml  Net    143 ml    Gen:  NAD, resting in bed with left arm propped on foam pillows and wrapped HEENT: moist mucous membranes CV: Regular rate and rhythm, no murmurs rubs or gallops PULM: clear to auscultation bilaterally. No wheezes/rales/rhonchi ABD: soft/nontender/nondistended/normal bowel sounds EXT: Unwrapped left arm-erythema minimal, 20cm incision appears intact. Drains in place on dorsum and ventral side of arm. Patient with full range of motion in left hand. 2+ radial pulse Neuro: Alert and oriented x3  Labs and imaging:   CBC  Lab 10/19/11 0645 10/18/11 0645 10/17/11 0354 10/17/11 0337  WBC 7.2 10.8* -- 17.6*  HGB 8.7* 9.3* 11.2* --  HCT 27.1* 28.9* 33.0* --  PLT 140* 141* -- 159   BMET  Lab 10/19/11 0645 10/18/11 0645 10/17/11 0354 10/17/11 0337  NA 137 134* 137 --  K 3.9 3.7 3.9 --  CL 106 103 107 --  CO2 23 22 -- 24  BUN 9 13 20  --  CREATININE 0.56 0.63 0.80 --  LABGLOM -- -- -- --  GLUCOSE 76 -- -- --  CALCIUM 8.3* 8.2* -- 8.5   Results for orders placed during the hospital encounter of 10/16/11 (from the past 24 hour(s))  SURGICAL PCR SCREEN     Status: Normal   Collection Time   10/18/11  12:52 PM      Component Value Range   MRSA, PCR NEGATIVE  NEGATIVE    Staphylococcus aureus NEGATIVE  NEGATIVE   WOUND CULTURE     Status: Normal (Preliminary result)   Collection Time   10/18/11  1:53 PM      Component Value Range   Specimen Description WOUND LEFT ARM     Special Requests PT ON VANCO     Gram Stain PENDING     Culture NO GROWTH 1 DAY     Report Status PENDING    WOUND CULTURE     Status: Normal (Preliminary result)   Collection Time   10/18/11  1:57 PM      Component Value Range   Specimen Description WOUND LEFT ARM     Special Requests PT ON VANCO      Gram Stain PENDING     Culture NO GROWTH 1 DAY     Report Status PENDING     Mr Forearm Left Wo/w Cm  10/17/2011  *RADIOLOGY REPORT*  Clinical Data: Dog bite.  Left arm swelling and pain.  To evaluate for deep abscess.  MRI OF THE LEFT FOREARM WITHOUT AND WITH CONTRAST  Technique:  Multiplanar, multisequence MR imaging was performed both before and after administration of intravenous contrast.  Contrast: 10mL MULTIHANCE GADOBENATE  DIMEGLUMINE 529 MG/ML IV SOLN  Comparison: Plain radiographs 10/16/2011  Findings: Imaging field of view is limited to the focal area of abnormality in the left forearm.  The patient was unable to tolerate completion of the entire sequence of images.  There is a focal skin defect along the dorsal aspect of the forearm with underlying increased T2 signal intensity and contrast enhancement throughout the posterior, medial, and lateral soft tissues.  Changes are consistent with cellulitis and edema.  There is increased signal intensity and contrast enhancement also within the underlying extensor compartment muscles consistent with myositis.  Along the posterior medial aspect of the forearm, there is fluid tracking along the deep fascial layer superficial to the muscle compartment and measuring about 0.6 x 3 cm axial dimension. There is some peripheral enhancement around this fluid collection consistent  with abscess.  No focal hypointense structures to suggest foreign bodies.  IMPRESSION: Diffuse subcutaneous soft tissue edema and enhancement consistent with cellulitis.  Focal fluid collection along the posterior medial forearm with peripheral enhancement consistent with abscess. Muscular enhancement consistent with myositis.  Original Report Authenticated By: Marlon Pel, M.D.     Assessment  46 y.o. year old female with cellulitis secondary to dog bite with worsening swelling, erythema and pain  1. Cellulitis with abscesss formation after dog bite now s/p extensive I+D by Dr. Orlan Leavens : "failed" treatment of Rocephin and Bactrim pt started on Vanco while in the ED.  1. Patient on clinda/vanc/zosyn. Clinda to improve anaerobe coverage.  1. Will discuss narrowing coverage with team. Patient needs coverage for typical cellulitis plus bacteria associated with dog bites -pasturella, salmonella, and capnocytophagia.  2. Would consider Clinda, Augmentin for oral transition.  2. WBC trending down from 17 to 7. Patient afebrile. MRSA swab negative.  3. Wound cultures pending-unsure of yield due to patient being on antibiotics at time of culture 4. Pain well controlled on morphine, motrin, and home methadone. Will consider adjustment to morphine q6.  2. Opioid Dependence: Pt is seen at the methadone clinic before admissionand reports having weaned from 125mg  q day to 60mg  qday.    -- Currently patient is on 70 mg of Methadone a day. We are continuing this in house. Her pain is     adequately controlled on this regimen.  3. Health Maintenance (Hx of ASCUS [Oct '10], Hx of +HEP C Ab [July '12], Hx of TB): Pt will need importance of chronic ongoing medical care emphasized at time of discharge. Will need follow up PAP to be scheduled.  Imaging in July '12 showed no X-Ray findings of active TB.    [ ]  Hep C PCR -pending   -- Defer to outpatient management.   --- FEN/GI: Regular diet  --- IVFs: NS at  [facility] --- PPx: Heparin  -- Disposition: Pending further recommendations from hand surgery and debridement as well as transition to PO antibiotics.   Tana Conch, MD PGY1, Family Medicine Teaching Service [phone]

## 2011-10-20 LAB — CBC
Hemoglobin: 8.7 g/dL — ABNORMAL LOW (ref 12.0–15.0)
MCH: 29.2 pg (ref 26.0–34.0)
MCHC: 32.5 g/dL (ref 30.0–36.0)
Platelets: 160 10*3/uL (ref 150–400)

## 2011-10-20 LAB — BASIC METABOLIC PANEL
BUN: 8 mg/dL (ref 6–23)
CO2: 26 mEq/L (ref 19–32)
Calcium: 8.2 mg/dL — ABNORMAL LOW (ref 8.4–10.5)
GFR calc non Af Amer: >90 mL/min (ref >90)
Glucose, Bld: 68 mg/dL — ABNORMAL LOW (ref 70–99)

## 2011-10-20 MED ORDER — MORPHINE SULFATE 2 MG/ML IJ SOLN
2.0000 mg | INTRAMUSCULAR | Status: DC | PRN
  Administered 2011-10-20 – 2011-10-22 (×7): 2 mg via INTRAVENOUS
  Filled 2011-10-20 (×7): qty 1

## 2011-10-20 NOTE — Progress Notes (Deleted)
Discharge home. Discharge home instruction given , no question verbalized.Alert and oriented, ambulatory, not in any distress.

## 2011-10-20 NOTE — Progress Notes (Signed)
Plan for repeat i/D in or tomorrow Will see tomorrow for surgery If wound looking better will consider transition to oral pain meds and po antibiotics.

## 2011-10-20 NOTE — Progress Notes (Signed)
I have seen and examined this patient. I have discussed with Dr Durene Cal.  I agree with their findings and plans as documented in their progress note for today.  Patient with significant acute throbbing pain in left forearm at site of I&D.  Not getting sustained relief from 1 mg IV every 4 hours of Morphine.  Pt reports that she has contacted her counselor at the methadone clinic to let her know that she is in the hospital and is receiving additional opiate therapy for her acute injury.   Increase IV morphine dose.  Patient will have increased opiate tolerance from her chronic methadone therapy, so may need higher doses to treat acute traumatic pain.

## 2011-10-20 NOTE — Progress Notes (Signed)
PGY-1 Daily Progress Note Family Medicine Teaching Service Ariana Jensen. Marti Sleigh, MD Service Pager: [phone]   Subjective: pain was slightly worse yesterday afternoon but is moderately well controlled this morning. Understands plan for surgery.   Objective:  Temp:  [97.4 F (36.3 C)-98.1 F (36.7 C)] 97.4 F (36.3 C) (01/29 0523) Pulse Rate:  [55-72] 55  (01/29 0523) Resp:  [16-20] 16  (01/29 0523) BP: (137-160)/(78-92) 145/86 mmHg (01/29 0523) SpO2:  [98 %-99 %] 99 % (01/29 0523)  Intake/Output Summary (Last 24 hours) at 10/20/11 1010 Last data filed at 10/20/11 0526  Gross per 24 hour  Intake    860 ml  Output   1250 ml  Net   -390 ml    Gen: NAD, resting in bed with left arm propped on foam pillows and wrapped  HEENT: moist mucous membranes  CV: Regular rate and rhythm, no murmurs rubs or gallops  PULM: clear to auscultation bilaterally. No wheezes/rales/rhonchi  ABD: soft/nontender/nondistended/normal bowel sounds  EXT: did not unwrap left arm. No extending erythema beyond marked line. Patient with 4/5 grip strength due to some swelling.  Previous exam: left arm-erythema minimal, 20cm incision appears intact. Drains in place on dorsum and ventral side of arm. Patient with full range of motion in left hand. 2+ radial pulse  Neuro: Alert and oriented x3  Labs and imaging:   CBC  Lab 10/20/11 0814 10/19/11 0645 10/18/11 0645  WBC 4.6 7.2 10.8*  HGB 8.7* 8.7* 9.3*  HCT 26.8* 27.1* 28.9*  PLT 160 140* 141*   BMET  Lab 10/20/11 0814 10/19/11 0645 10/18/11 0645  NA 139 137 134*  K 3.7 3.9 3.7  CL 105 106 103  CO2 26 23 22   BUN 8 9 13   CREATININE 0.57 0.56 0.63  LABGLOM -- -- --  GLUCOSE 68* -- --  CALCIUM 8.2* 8.3* 8.2*       Assessment  46 y.o. year old female with cellulitis secondary to dog bite with worsening swelling, erythema and pain  1. Cellulitis with abscesss formation after dog bite now s/p extensive I+D by Dr. Orlan Leavens : "failed" treatment of  Rocephin and Bactrim pt started on Vanco while in the ED.  1. Patient on clinda/vanc/zosyn. Clinda to improve anaerobe coverage and for synergistic effect with vancomycin. GIven extent of infection will continue 2. Plan for patient to go back to OR on 1/30.  1. Will discuss narrowing coverage with team after repeat surgery.  1. Patient needs coverage for typical cellulitis plus bacteria associated with dog bites -pasturella, salmonella, and capnocytophagia.  2. Plan for Clinda and levaquin at this point.  2. Wound cultures unrevealing at this point and unsure if will be helpful as patient had already been on abx prior to cultures.  3. WBC trending down from 17 to 5. Patient afebrile.  4. Pain well controlled on morphine, motrin, and home methadone.  2. Opioid Dependence: Pt is seen at the methadone clinic before admissionand reports having weaned from 125mg  q day to 60mg  qday.    -- Currently patient is on 70 mg of Methadone a day. We are continuing this in house. Her pain is adequately   controlled on this regimen.  3. Health Maintenance (Hx of ASCUS [Oct '10], Hx of +HEP C Ab [July '12], Hx of TB): Pt will need importance of chronic ongoing medical care emphasized at time of discharge. Will need follow up PAP to be scheduled. Imaging in July '12 showed no X-Ray findings of active  TB. Hep C PCR -pending. Defer to outpatient management.  4. Hx HTN-intermittently elevated with systolics in 140s 150s but othertimes low 100s. Will monitor. Also difficult to assess as patient has had some pain.     --- FEN/GI: Regular diet  --- IVFs: NS at [name]  --- PPx: Heparin  -- Disposition: Pending further recommendations from hand surgery and debridement as well as transition to PO antibiotics.     Tana Conch, MD PGY1, Family Medicine Teaching Service [phone]

## 2011-10-21 ENCOUNTER — Inpatient Hospital Stay (HOSPITAL_COMMUNITY): Payer: Self-pay | Admitting: Anesthesiology

## 2011-10-21 ENCOUNTER — Encounter (HOSPITAL_COMMUNITY): Payer: Self-pay | Admitting: Anesthesiology

## 2011-10-21 ENCOUNTER — Encounter (HOSPITAL_COMMUNITY)
Admission: EM | Disposition: A | Discharge status: Home / Self Care | Payer: Self-pay | Source: Home / Self Care | Attending: Family Medicine

## 2011-10-21 HISTORY — PX: I&D EXTREMITY: SHX5045

## 2011-10-21 LAB — WOUND CULTURE

## 2011-10-21 LAB — BASIC METABOLIC PANEL
BUN: 11 mg/dL (ref 6–23)
Creatinine, Ser: 0.64 mg/dL (ref 0.50–1.10)
GFR calc Af Amer: >90 mL/min (ref >90)
GFR calc non Af Amer: >90 mL/min (ref >90)
Glucose, Bld: 83 mg/dL (ref 70–99)

## 2011-10-21 LAB — HEPATITIS C VRS RNA DETECT BY PCR-QUAL: Hepatitis C Vrs RNA by PCR-Qual: POSITIVE — AB

## 2011-10-21 LAB — CBC
HCT: 28 % — ABNORMAL LOW (ref 36.0–46.0)
MCH: 28.5 pg (ref 26.0–34.0)
MCHC: 32.1 g/dL (ref 30.0–36.0)
RDW: 14.6 % (ref 11.5–15.5)

## 2011-10-21 SURGERY — IRRIGATION AND DEBRIDEMENT EXTREMITY
Anesthesia: General | Laterality: Left

## 2011-10-21 MED ORDER — LACTATED RINGERS IV SOLN
INTRAVENOUS | Status: DC
  Administered 2011-10-21 (×2): via INTRAVENOUS

## 2011-10-21 MED ORDER — KETAMINE HCL 100 MG/ML IJ SOLN
INTRAMUSCULAR | Status: AC
  Filled 2011-10-21: qty 1

## 2011-10-21 MED ORDER — ACETAMINOPHEN 10 MG/ML IV SOLN
1000.0000 mg | Freq: Once | INTRAVENOUS | Status: AC | PRN
  Administered 2011-10-21: 1000 mg via INTRAVENOUS
  Filled 2011-10-21: qty 100

## 2011-10-21 MED ORDER — ONDANSETRON HCL 4 MG/2ML IJ SOLN: 4.0000 mg | Freq: Once | INTRAMUSCULAR | Status: AC | PRN

## 2011-10-21 MED ORDER — ONDANSETRON HCL 4 MG/2ML IJ SOLN
INTRAMUSCULAR | Status: DC | PRN
  Administered 2011-10-21: 4 mg via INTRAVENOUS

## 2011-10-21 MED ORDER — MIDAZOLAM HCL 5 MG/5ML IJ SOLN
INTRAMUSCULAR | Status: DC | PRN
  Administered 2011-10-21: 2 mg via INTRAVENOUS

## 2011-10-21 MED ORDER — HYDROMORPHONE HCL PF 1 MG/ML IJ SOLN
0.2500 mg | INTRAMUSCULAR | Status: DC | PRN
  Administered 2011-10-21 – 2011-10-22 (×5): 0.5 mg via INTRAVENOUS
  Filled 2011-10-21: qty 1

## 2011-10-21 MED ORDER — PROPOFOL 10 MG/ML IV EMUL
INTRAVENOUS | Status: DC | PRN
  Administered 2011-10-21: 300 mg via INTRAVENOUS

## 2011-10-21 MED ORDER — LACTATED RINGERS IV SOLN
INTRAVENOUS | Status: DC | PRN
  Administered 2011-10-21: 17:00:00 via INTRAVENOUS

## 2011-10-21 MED ORDER — FENTANYL CITRATE 0.05 MG/ML IJ SOLN
INTRAMUSCULAR | Status: DC | PRN
  Administered 2011-10-21 (×3): 50 ug via INTRAVENOUS
  Administered 2011-10-21 (×3): 100 ug via INTRAVENOUS

## 2011-10-21 MED ORDER — SODIUM CHLORIDE 0.9 % IR SOLN
Status: DC | PRN
  Administered 2011-10-21: 3000 mL

## 2011-10-21 MED ORDER — MIDAZOLAM HCL 2 MG/2ML IJ SOLN
0.5000 mg | Freq: Once | INTRAMUSCULAR | Status: AC | PRN
  Administered 2011-10-21: 1 mg via INTRAVENOUS

## 2011-10-21 SURGICAL SUPPLY — 54 items
BANDAGE CONFORM 2  STR LF (GAUZE/BANDAGES/DRESSINGS) IMPLANT
BANDAGE ELASTIC 3 VELCRO ST LF (GAUZE/BANDAGES/DRESSINGS) IMPLANT
BANDAGE ELASTIC 4 VELCRO ST LF (GAUZE/BANDAGES/DRESSINGS) IMPLANT
BANDAGE GAUZE ELAST BULKY 4 IN (GAUZE/BANDAGES/DRESSINGS) IMPLANT
BNDG CMPR 9X4 STRL LF SNTH (GAUZE/BANDAGES/DRESSINGS)
BNDG COHESIVE 1X5 TAN STRL LF (GAUZE/BANDAGES/DRESSINGS) IMPLANT
BNDG ESMARK 4X9 LF (GAUZE/BANDAGES/DRESSINGS) IMPLANT
CLOTH BEACON ORANGE TIMEOUT ST (SAFETY) ×2 IMPLANT
CORDS BIPOLAR (ELECTRODE) ×2 IMPLANT
COVER SURGICAL LIGHT HANDLE (MISCELLANEOUS) ×2 IMPLANT
CUFF TOURNIQUET SINGLE 18IN (TOURNIQUET CUFF) IMPLANT
CUFF TOURNIQUET SINGLE 24IN (TOURNIQUET CUFF) IMPLANT
DRAIN PENROSE 1/4X12 LTX STRL (WOUND CARE) IMPLANT
DRAPE SURG 17X23 STRL (DRAPES) ×2 IMPLANT
DRSG ADAPTIC 3X8 NADH LF (GAUZE/BANDAGES/DRESSINGS) IMPLANT
DRSG EMULSION OIL 3X3 NADH (GAUZE/BANDAGES/DRESSINGS) ×2 IMPLANT
ELECT REM PT RETURN 9FT ADLT (ELECTROSURGICAL)
ELECTRODE REM PT RTRN 9FT ADLT (ELECTROSURGICAL) IMPLANT
GAUZE KERLIX 2  STERILE LF (GAUZE/BANDAGES/DRESSINGS) ×2 IMPLANT
GAUZE SPONGE 4X4 12PLY STRL LF (GAUZE/BANDAGES/DRESSINGS) ×2 IMPLANT
GAUZE XEROFORM 1X8 LF (GAUZE/BANDAGES/DRESSINGS) IMPLANT
GAUZE XEROFORM 5X9 LF (GAUZE/BANDAGES/DRESSINGS) IMPLANT
GLOVE BIOGEL PI IND STRL 8.5 (GLOVE) ×1 IMPLANT
GLOVE BIOGEL PI INDICATOR 8.5 (GLOVE) ×1
GLOVE SURG ORTHO 8.0 STRL STRW (GLOVE) ×2 IMPLANT
GOWN PREVENTION PLUS XLARGE (GOWN DISPOSABLE) ×2 IMPLANT
GOWN STRL NON-REIN LRG LVL3 (GOWN DISPOSABLE) ×2 IMPLANT
HANDPIECE INTERPULSE COAX TIP (DISPOSABLE) ×2
KIT BASIN OR (CUSTOM PROCEDURE TRAY) ×2 IMPLANT
KIT ROOM TURNOVER OR (KITS) ×2 IMPLANT
MANIFOLD NEPTUNE II (INSTRUMENTS) ×2 IMPLANT
NEEDLE HYPO 25GX1X1/2 BEV (NEEDLE) IMPLANT
NS IRRIG 1000ML POUR BTL (IV SOLUTION) IMPLANT
PACK ORTHO EXTREMITY (CUSTOM PROCEDURE TRAY) ×2 IMPLANT
PAD ARMBOARD 7.5X6 YLW CONV (MISCELLANEOUS) ×2 IMPLANT
PAD CAST 4YDX4 CTTN HI CHSV (CAST SUPPLIES) IMPLANT
PADDING CAST COTTON 4X4 STRL (CAST SUPPLIES)
SET HNDPC FAN SPRY TIP SCT (DISPOSABLE) ×1 IMPLANT
SOAP 2 % CHG 4 OZ (WOUND CARE) ×2 IMPLANT
SPONGE GAUZE 4X4 12PLY (GAUZE/BANDAGES/DRESSINGS) IMPLANT
SPONGE LAP 18X18 X RAY DECT (DISPOSABLE) IMPLANT
SPONGE LAP 4X18 X RAY DECT (DISPOSABLE) IMPLANT
SUCTION FRAZIER TIP 10 FR DISP (SUCTIONS) ×2 IMPLANT
SUT ETHILON 4 0 PS 2 18 (SUTURE) IMPLANT
SUT ETHILON 5 0 P 3 18 (SUTURE)
SUT NYLON ETHILON 5-0 P-3 1X18 (SUTURE) IMPLANT
SYR CONTROL 10ML LL (SYRINGE) IMPLANT
TOWEL OR 17X24 6PK STRL BLUE (TOWEL DISPOSABLE) ×2 IMPLANT
TOWEL OR 17X26 10 PK STRL BLUE (TOWEL DISPOSABLE) ×2 IMPLANT
TUBE ANAEROBIC SPECIMEN COL (MISCELLANEOUS) IMPLANT
TUBE CONNECTING 12X1/4 (SUCTIONS) ×2 IMPLANT
UNDERPAD 30X30 INCONTINENT (UNDERPADS AND DIAPERS) ×2 IMPLANT
WATER STERILE IRR 1000ML POUR (IV SOLUTION) IMPLANT
YANKAUER SUCT BULB TIP NO VENT (SUCTIONS) ×2 IMPLANT

## 2011-10-21 NOTE — Anesthesia Postprocedure Evaluation (Signed)
  Anesthesia Post-op Note  Patient: Chief Financial Officer  Procedure(s) Performed:  IRRIGATION AND DEBRIDEMENT EXTREMITY  Patient Location: PACU  Anesthesia Type: General  Level of Consciousness: awake, alert  and oriented  Airway and Oxygen Therapy: Patient Spontanous Breathing and Patient connected to nasal cannula oxygen  Post-op Pain: mild  Post-op Assessment: Post-op Vital signs reviewed and Patient's Cardiovascular Status Stable  Post-op Vital Signs: stable  Complications: No apparent anesthesia complications

## 2011-10-21 NOTE — Preoperative (Signed)
Beta Blockers   Reason not to administer Beta Blockers:Not Applicable 

## 2011-10-21 NOTE — Transfer of Care (Signed)
Immediate Anesthesia Transfer of Care Note  Patient: Ariana Jensen  Procedure(s) Performed:  IRRIGATION AND DEBRIDEMENT EXTREMITY  Patient Location: PACU  Anesthesia Type: General  Level of Consciousness: awake, alert  and oriented  Airway & Oxygen Therapy: Patient Spontanous Breathing and Patient connected to nasal cannula oxygen  Post-op Assessment: Report given to PACU RN and Post -op Vital signs reviewed and stable  Post vital signs: Reviewed and stable  Complications: No apparent anesthesia complications 

## 2011-10-21 NOTE — Progress Notes (Signed)
PGY-1 Daily Progress Note Family Medicine Teaching Service Ariana Jensen. Ariana Sleigh, MD Service Pager: [phone]   Subjective: pain better controlled with increased morphine dosage  Objective:  Temp:  [97.9 F (36.6 C)-98.4 F (36.9 C)] 97.9 F (36.6 C) (01/30 0535) Pulse Rate:  [60-61] 60  (01/30 0535) Resp:  [16-18] 18  (01/30 0535) BP: (150-157)/(82-90) 150/90 mmHg (01/30 0535) SpO2:  [98 %-100 %] 100 % (01/30 0535)  Intake/Output Summary (Last 24 hours) at 10/21/11 1000 Last data filed at 10/21/11 0536  Gross per 24 hour  Intake      0 ml  Output    450 ml  Net   -450 ml    Gen: NAD, resting in bed with left arm propped on foam pillows and wrapped  HEENT: moist mucous membranes  CV: Regular rate and rhythm, no murmurs rubs or gallops  PULM: clear to auscultation bilaterally. No wheezes/rales/rhonchi  ABD: soft/nontender/nondistended/normal bowel sounds  EXT:  left arm-No extending erythema beyond marked line. Patient with 5/5 grip strength. erythema minimal, 15cm incision appears intact. Drains in place on dorsum and ventral side of arm. Patient with full range of motion in left hand. 2+ radial pulse  Neuro: Alert and oriented x3   Labs and imaging:   CBC  Lab 10/21/11 0535 10/20/11 0814 10/19/11 0645  WBC 4.8 4.6 7.2  HGB 9.0* 8.7* 8.7*  HCT 28.0* 26.8* 27.1*  PLT 183 160 140*   BMET  Lab 10/21/11 0535 10/20/11 0814 10/19/11 0645  NA 140 139 137  K 3.7 3.7 3.9  CL 105 105 106  CO2 26 26 23   BUN 11 8 9   CREATININE 0.64 0.57 0.56  LABGLOM -- -- --  GLUCOSE 83 -- --  CALCIUM 8.5 8.2* 8.3*    Assessment  46 y.o. year old female with cellulitis secondary to dog bite with worsening swelling, erythema and pain  1. Cellulitis with abscesss formation after dog bite now s/p extensive I+D by Dr. Orlan Leavens : "failed" treatment of Rocephin and Bactrim pt started on Vanco while in the ED.  1. Plan for patient to go back to OR on 1/30.  2. Patient on clinda/vanc/zosyn.  Wound cultures without growth but patient had already been on abx. Would consider stopping clindamycin or transitioning to orals.  1. Patient needs coverage for typical cellulitis plus bacteria associated with dog bites -pasturella, salmonella, and capnocytophagia 2. Plan for Clinda and levaquin at this point once PO for 14 days total.   3. WBC not elevated. Patient afebrile.  4. Pain well controlled on morphine prn, motrin, and home methadone.  1. After repeat surgery consider transition to oral pain medications 2. Opioid Dependence: Pt is seen at the methadone clinic before admissionand reports having weaned from 125mg  q day to 60mg  qday.  -- Currently patient is on 70 mg of Methadone a day. We are continuing this in house. Her pain is adequately controlled on this regimen.  3. Hepatitis C-HEP C Ab [name] '12]. Hep C RNA PCR positive. HIV negative during previous hospitalization. Hep panel only with Hep C. May need immunization for Hep A. LFTs stable. Will repeat in AM. Will need to get set up with ID. Would consider ultrasound to evaluate for cirrhosis.  1. Will discuss avoiding alcohol, smoking, limit tylenol to 2g in 24 hours, household contacts and sexual activity with patient as well as that patient should not donate any blood or other body fluids.  4. Hx HTN-intermittently elevated with systolics in 140s  150s but othertimes low 100s during last few days. Trend is elevated blood pressures. Will likely d/c home on HCTZ. Will monitor for now . Also difficult to assess as patient has had some pain. 5. Health Maintenance (Hx of ASCUS [Oct '10], Hx of +, Hx of TB): Pt will need importance of chronic ongoing medical care emphasized at time of discharge. Will need follow up PAP to be scheduled. Imaging in July '12 showed no X-Ray findings of active TB.  --- FEN/GI: Regular diet  --- IVFs: NS at [facility]  --- PPx: Heparin  -- Disposition: Pending further recommendations from hand surgery and debridement as  well as transition to PO antibiotics.    Tana Conch, MD PGY1, Family Medicine Teaching Service [phone]

## 2011-10-21 NOTE — Progress Notes (Signed)
R/B/A DISCUSSED WITH PT IN HOSPITAL.  PT VOICED UNDERSTANDING OF PLAN CONSENT SIGNED DAY OF SURGERY PT SEEN AND EXAMINED PRIOR TO OPERATIVE PROCEDURE/DAY OF SURGERY SITE MARKED. QUESTIONS ANSWERED WILL STAY AS INPATIENT FOLLOWING SURGERY PLAN FOR REPEAT I/D AND LIKELY WOUND CLOSURE IF THINGS LOOK BETTER.

## 2011-10-21 NOTE — Progress Notes (Signed)
I discussed with Dr Hunter.  I agree with their plans documented in their  Note for today.  

## 2011-10-21 NOTE — Anesthesia Preprocedure Evaluation (Addendum)
Anesthesia Evaluation  Patient identified by MRN, date of birth, ID band Patient awake    Reviewed: Allergy & Precautions, H&P , NPO status , Patient's Chart, lab work & pertinent test results  History of Anesthesia Complications (+) AWARENESS UNDER ANESTHESIA  Airway Mallampati: I TM Distance: >3 FB Neck ROM: full    Dental  (+) Poor Dentition   Pulmonary    Pulmonary exam normal       Cardiovascular hypertension, regular Normal    Neuro/Psych    GI/Hepatic (+)     substance abuse   , Hepatitis -, C  Endo/Other    Renal/GU      Musculoskeletal   Abdominal   Peds  Hematology   Anesthesia Other Findings   Reproductive/Obstetrics                          Anesthesia Physical Anesthesia Plan  ASA: II  Anesthesia Plan: General   Post-op Pain Management:    Induction: Intravenous  Airway Management Planned: LMA  Additional Equipment:   Intra-op Plan:   Post-operative Plan: Extubation in OR  Informed Consent: I have reviewed the patients History and Physical, chart, labs and discussed the procedure including the risks, benefits and alternatives for the proposed anesthesia with the patient or authorized representative who has indicated his/her understanding and acceptance.     Plan Discussed with: CRNA, Anesthesiologist and Surgeon  Anesthesia Plan Comments:         Anesthesia Quick Evaluation

## 2011-10-21 NOTE — Anesthesia Procedure Notes (Signed)
Procedure Name: LMA Insertion Date/Time: 10/21/2011 9:55 PM Performed by: Delbert Harness Pre-anesthesia Checklist: Patient identified, Timeout performed, Emergency Drugs available, Suction available and Patient being monitored Patient Re-evaluated:Patient Re-evaluated prior to inductionOxygen Delivery Method: Circle System Utilized Preoxygenation: Pre-oxygenation with 100% oxygen Intubation Type: IV induction LMA: LMA inserted LMA Size: 4.0 Number of attempts: 1 Placement Confirmation: positive ETCO2 and breath sounds checked- equal and bilateral Tube secured with: Tape Dental Injury: Teeth and Oropharynx as per pre-operative assessment

## 2011-10-21 NOTE — Brief Op Note (Signed)
10/16/2011 - 10/21/2011  10:32 PM  PATIENT:  Ariana Jensen  46 y.o. female  PRE-OPERATIVE DIAGNOSIS:  left forearm abscess  POST-OPERATIVE DIAGNOSIS:  left forearm abscess  PROCEDURE:  Procedure(s): IRRIGATION AND DEBRIDEMENT EXTREMITY  SURGEON:  Surgeon(s): Sharma Covert, MD  PHYSICIAN ASSISTANT:   ASSISTANTS: none   ANESTHESIA:   general  EBL:  Total I/O In: 800 [I.V.:800] Out: -   BLOOD ADMINISTERED:none  DRAINS: none   LOCAL MEDICATIONS USED:  NONE  SPECIMEN:  No Specimen  DISPOSITION OF SPECIMEN:  N/A  COUNTS:  YES  TOURNIQUET:  * No tourniquets in log *  DICTATION: .Other Dictation: Dictation Number [id number]  PLAN OF CARE: Admit to inpatient   PATIENT DISPOSITION:  PACU - hemodynamically stable.   Delay start of Pharmacological VTE agent (>24hrs) due to surgical blood loss or risk of bleeding:  {YES/NO/NOT APPLICABLE:20182

## 2011-10-21 NOTE — Progress Notes (Signed)
ANTIBIOTIC CONSULT NOTE - FOLLOW UP  Pharmacy Consult for Vancomycin, Zosyn Indication: L-forearm cellulitis s/p dog bite  No Known Allergies  Patient Measurements: Height: 5\' 3"  (160 cm) Weight: 120 lb (54.432 kg) IBW/kg (Calculated) : 52.4    Vital Signs: Temp: 97.9 F (36.6 C) (01/30 0535) Temp src: Oral (01/30 0535) BP: 150/90 mmHg (01/30 0535) Pulse Rate: 60  (01/30 0535) Intake/Output from previous day: 01/29 0701 - 01/30 0700 In: -  Out: 450 [Urine:450] Intake/Output from this shift:    Labs:  Basename 10/21/11 0535 10/20/11 0814 10/19/11 0645  WBC 4.8 4.6 7.2  HGB 9.0* 8.7* 8.7*  PLT 183 160 140*  LABCREA -- -- --  CREATININE 0.64 0.57 0.56   Estimated Creatinine Clearance: 74.2 ml/min (by C-G formula based on Cr of 0.64).   Microbiology: Recent Results (from the past 720 hour(s))  SURGICAL PCR SCREEN     Status: Normal   Collection Time   10/18/11 12:52 PM      Component Value Range Status Comment   MRSA, PCR NEGATIVE  NEGATIVE  Final    Staphylococcus aureus NEGATIVE  NEGATIVE  Final   WOUND CULTURE     Status: Normal   Collection Time   10/18/11  1:53 PM      Component Value Range Status Comment   Specimen Description WOUND LEFT ARM   Final    Special Requests PT ON VANCO   Final    Gram Stain     Final    Value: NO WBC SEEN     NO SQUAMOUS EPITHELIAL CELLS SEEN     NO ORGANISMS SEEN   Culture NO GROWTH 2 DAYS   Final    Report Status 10/21/2011 FINAL   Final   ANAEROBIC CULTURE     Status: Normal (Preliminary result)   Collection Time   10/18/11  1:53 PM      Component Value Range Status Comment   Specimen Description WOUND LEFT ARM   Final    Special Requests PT ON VANCO   Final    Gram Stain PENDING   Incomplete    Culture     Final    Value: NO ANAEROBES ISOLATED; CULTURE IN PROGRESS FOR 5 DAYS   Report Status PENDING   Incomplete   WOUND CULTURE     Status: Normal   Collection Time   10/18/11  1:57 PM      Component Value Range Status  Comment   Specimen Description WOUND LEFT ARM   Final    Special Requests PT ON VANCO    Final    Gram Stain     Final    Value: MODERATE WBC PRESENT,BOTH PMN AND MONONUCLEAR     NO SQUAMOUS EPITHELIAL CELLS SEEN     NO ORGANISMS SEEN   Culture NO GROWTH 2 DAYS   Final    Report Status 10/21/2011 FINAL   Final   ANAEROBIC CULTURE     Status: Normal (Preliminary result)   Collection Time   10/18/11  1:57 PM      Component Value Range Status Comment   Specimen Description WOUND LEFT ARM   Final    Special Requests PT ON VANCO   Final    Gram Stain     Final    Value: MODERATE WBC PRESENT,BOTH PMN AND MONONUCLEAR     NO SQUAMOUS EPITHELIAL CELLS SEEN     NO ORGANISMS SEEN   Culture     Final  Value: NO ANAEROBES ISOLATED; CULTURE IN PROGRESS FOR 5 DAYS   Report Status PENDING   Incomplete     Anti-infectives Anti-infectives     Start     Dose/Rate Route Frequency Ordered Stop   10/18/11 1200   vancomycin (VANCOCIN) 750 mg in sodium chloride 0.9 % 150 mL IVPB        750 mg 150 mL/hr over 60 Minutes Intravenous Every 12 hours 10/18/11 0856     10/17/11 1200   vancomycin (VANCOCIN) IVPB 1000 mg/200 mL premix  Status:  Discontinued        1,000 mg 200 mL/hr over 60 Minutes Intravenous 2 times daily 10/17/11 0756 10/18/11 0855   10/17/11 1200   clindamycin (CLEOCIN) IVPB 300 mg        300 mg 100 mL/hr over 30 Minutes Intravenous 4 times per day 10/17/11 1058     10/17/11 0800   piperacillin-tazobactam (ZOSYN) IVPB 3.375 g        3.375 g 12.5 mL/hr over 240 Minutes Intravenous 3 times per day 10/17/11 0748     10/17/11 0330   vancomycin (VANCOCIN) IVPB 1000 mg/200 mL premix        1,000 mg 200 mL/hr over 60 Minutes Intravenous  Once 10/17/11 0328 10/17/11 0440   10/16/11 1430   cefTRIAXone (ROCEPHIN) 1 g in dextrose 5 % 50 mL IVPB  Status:  Discontinued        1 g 100 mL/hr over 30 Minutes Intravenous Every 12 hours 10/16/11 1427 10/17/11 0607   10/16/11 1430    sulfamethoxazole-trimethoprim (BACTRIM DS) 800-160 MG per tablet 1 tablet  Status:  Discontinued        1 tablet Oral Every 12 hours 10/16/11 1427 10/17/11 0607   10/16/11 1400   cefTRIAXone (ROCEPHIN) 1 g in dextrose 5 % 50 mL IVPB        1 g 100 mL/hr over 30 Minutes Intravenous  Once 10/16/11 1353 10/16/11 1442   10/16/11 1400  sulfamethoxazole-trimethoprim (BACTRIM DS) 800-160 MG per tablet 2 tablet       2 tablet Oral  Once 10/16/11 1353 10/16/11 1411          Assessment: Patient is a 46 y/o female s/p dog bite with cellulitis on Vancomycin and Zosyn.  Afebrile, WBC down.  Scheduled repeat I & D today, then transition to oral meds.  Goal of Therapy:  Vancomycin trough level 10-15 mcg/ml  Plan:  1.  F/U I & D today. 2.  Continue Vancomycin and Zosyn until transition to po meds.  No change in antibiotic doses.   Luceal Hollibaugh, Elisha Headland, Pharm.D. 10/21/2011 10:00 AM

## 2011-10-21 NOTE — Progress Notes (Signed)
PT'S WOUNDS LOOKED MUCH BETTER,  ARM MUCH BETTER NO PURULENCE CLOSED BOTH WOUNDS MEDIALLY AND LATERALLY OK FOR ORAL ANTIBIOTICS KEEP DRESSING ON AT ALL TIMES UNTIL SHE SEES ME BACK IN OFFICE I WILL NEED TO SEE HER IN OFFICE IN ONE WEEK [phone] TO MAKE APPOINTMENT PAGE [phone] IF ANY QUESTIONS

## 2011-10-22 DIAGNOSIS — L0390 Cellulitis, unspecified: Secondary | ICD-10-CM | POA: Diagnosis present

## 2011-10-22 DIAGNOSIS — I10 Essential (primary) hypertension: Secondary | ICD-10-CM | POA: Diagnosis present

## 2011-10-22 DIAGNOSIS — L0291 Cutaneous abscess, unspecified: Secondary | ICD-10-CM | POA: Diagnosis present

## 2011-10-22 DIAGNOSIS — B182 Chronic viral hepatitis C: Secondary | ICD-10-CM | POA: Diagnosis present

## 2011-10-22 DIAGNOSIS — Z8611 Personal history of tuberculosis: Secondary | ICD-10-CM | POA: Diagnosis present

## 2011-10-22 LAB — CBC
MCH: 28.5 pg (ref 26.0–34.0)
MCV: 89.1 fL (ref 78.0–100.0)
Platelets: 214 10*3/uL (ref 150–400)
RBC: 3.12 MIL/uL — ABNORMAL LOW (ref 3.87–5.11)
RDW: 14.9 % (ref 11.5–15.5)
WBC: 7.9 10*3/uL (ref 4.0–10.5)

## 2011-10-22 LAB — COMPREHENSIVE METABOLIC PANEL
ALT: 13 U/L (ref 0–35)
AST: 21 U/L (ref 0–37)
Albumin: 2.2 g/dL — ABNORMAL LOW (ref 3.5–5.2)
CO2: 27 mEq/L (ref 19–32)
Calcium: 8.3 mg/dL — ABNORMAL LOW (ref 8.4–10.5)
Creatinine, Ser: 0.58 mg/dL (ref 0.50–1.10)
GFR calc non Af Amer: >90 mL/min (ref >90)
Sodium: 141 mEq/L (ref 135–145)

## 2011-10-22 MED ORDER — HYDROCHLOROTHIAZIDE 25 MG PO TABS
25.0000 mg | ORAL_TABLET | Freq: Every day | ORAL | Status: DC
  Administered 2011-10-22: 25 mg via ORAL
  Filled 2011-10-22: qty 1

## 2011-10-22 MED ORDER — IBUPROFEN 600 MG PO TABS: 600.0000 mg | ORAL_TABLET | Freq: Four times a day (QID) | ORAL | Status: AC | PRN

## 2011-10-22 MED ORDER — OXYCODONE HCL 5 MG PO TABS
5.0000 mg | ORAL_TABLET | Freq: Once | ORAL | Status: AC
  Administered 2011-10-22: 5 mg via ORAL
  Filled 2011-10-22: qty 1

## 2011-10-22 MED ORDER — LEVOFLOXACIN 750 MG PO TABS
750.0000 mg | ORAL_TABLET | Freq: Every day | ORAL | Status: DC
  Administered 2011-10-22: 750 mg via ORAL
  Filled 2011-10-22: qty 1

## 2011-10-22 MED ORDER — CLINDAMYCIN HCL 300 MG PO CAPS
300.0000 mg | ORAL_CAPSULE | Freq: Four times a day (QID) | ORAL | Status: DC
  Administered 2011-10-22: 300 mg via ORAL
  Filled 2011-10-22 (×4): qty 1

## 2011-10-22 MED ORDER — OXYCODONE-ACETAMINOPHEN 5-325 MG PO TABS: 1.0000 | ORAL_TABLET | ORAL | Status: AC | PRN

## 2011-10-22 MED ORDER — LEVOFLOXACIN 750 MG PO TABS: 750.0000 mg | ORAL_TABLET | Freq: Every day | ORAL | Status: AC

## 2011-10-22 MED ORDER — OXYCODONE HCL 5 MG PO TABS: 5.0000 mg | ORAL_TABLET | ORAL | Status: AC | PRN

## 2011-10-22 MED ORDER — HYDROCHLOROTHIAZIDE 25 MG PO TABS: 25.0000 mg | ORAL_TABLET | Freq: Every day | ORAL | Status: DC

## 2011-10-22 MED ORDER — CLINDAMYCIN HCL 150 MG PO CAPS: 300.0000 mg | ORAL_CAPSULE | Freq: Four times a day (QID) | ORAL | Status: AC

## 2011-10-22 MED ORDER — OXYCODONE-ACETAMINOPHEN 5-325 MG PO TABS
1.0000 | ORAL_TABLET | ORAL | Status: DC | PRN
  Administered 2011-10-22: 1 via ORAL
  Filled 2011-10-22: qty 1

## 2011-10-22 MED FILL — Hydromorphone HCl Inj 1 MG/ML: INTRAMUSCULAR | Qty: 1 | Status: AC

## 2011-10-22 NOTE — Discharge Summary (Signed)
I discussed with Dr Durene Cal.  I agree with their plans documented in their Progress Note for today.

## 2011-10-22 NOTE — Progress Notes (Signed)
I discussed with Dr Hunter.  I agree with their plans documented in their  Note for today.  

## 2011-10-22 NOTE — Discharge Summary (Signed)
Physician Discharge Summary  Patient ID: Ariana Jensen MRN: [id number] DOB: 26-Jul-1966 Age: 46 y.o.  Admit date: 10/16/2011 Discharge date: 10/22/2011  PCP: No primary provider on file.  Consultants: Ariana Bienenstock, MD of Orthopedics   Discharge Diagnosis: Principal Problem:  *Abscess and cellulitis Active Problems:  Opioid dependence  HTN (hypertension)  Hepatitis c, chronic    Hospital Course 46 y.o. year old female with cellulitis secondary to dog bite with worsening swelling, erythema and pain after 24 hours of failed treatment of Rocephin and Bactrim in ED observation suite.  Patient was afebrile throughout stay. Initially had a WBC count of 17 which trended down during stay with antibiotics.  1. Cellulitis with abscesss formation after dog bite now s/p extensive I+D  On 1/27 with reexploration 1/30 (wound appeared much improved and was without purulence) and closure  by Dr. Melvyn Novas.  1. Patient received IV abx with clinda/vanc/zosyn for total of 7 days. Patient needed coverage for typical cellulitis plus bacteria associated with dog bites -pasturella, salmonella, and capnocytophagia. Patient to receive antibiotics from [facility] as total course estimated at <$50.  2. On 1/31, transitioned to Clinda and levaquin x 7 days for total 14 day course.  3. Pain well controlled during stay on morphine and dilaudid prn, motrin, and home methadone. Transitioned to Percocet 5/325 q4 hours on day of discharge (#30 given). Also, #5 of oxycodone 5mg  given.  2. Opioid Dependence: Pt is seen at the methadone clinic before admission and reports having weaned from 125mg  q day to 60mg  qday. Continued 70 mg of Methadone a day in the hospital. Discharged on this regimen. Patient reported information on narcotics to her counselor at methadone clinic.  3. Hepatitis C-HEP C Ab [name] '12]. We obtained a Hep C RNA PCR which was positive. HIV negative during previous hospitalization. Hep panel only with Hep C.  May need immunization for Hep A. LFTs stable. Will need to get set up with ID. Would consider ultrasound to evaluate for cirrhosis.  1. counseled and given handout on avoiding alcohol, smoking, limit tylenol to 2g in 24 hours, household contacts and sexual activity with patient as well as that patient should not donate any blood or other body fluids.  4. Hx HTN-intermittently elevated with systolics in 150s 160s but occasionally in normal range. Started patient on HCTZ 25mg  daily at discharge.  5. Health Maintenance (Hx of ASCUS [Oct '10], Hx of +, Hx of TB): Pt will need importance of chronic ongoing medical care emphasized at time of discharge. Will need follow up PAP to be scheduled. Imaging in July '12 showed no X-Ray findings of active TB.      Procedures/Imaging:  Dg Forearm Left  10/16/2011  *RADIOLOGY REPORT*  Clinical Data: Question foreign body distal forearm  LEFT FOREARM - 2 VIEW  Comparison: None.  Findings: Four views of the left radius and ulna submitted.  No acute fracture or subluxation. Dorsal diffuse mild soft tissue swelling.  No periosteal reaction or bony erosion.  No radiopaque foreign body.  IMPRESSION: No acute fracture or subluxation.  No radiopaque foreign body is identified.  Original Report Authenticated By: Natasha Mead, M.D.   Mr Forearm Left Wo/w Cm  10/17/2011  *RADIOLOGY REPORT*  Clinical Data: Dog bite.  Left arm swelling and pain.  To evaluate for deep abscess.  MRI OF THE LEFT FOREARM WITHOUT AND WITH CONTRAST  Technique:  Multiplanar, multisequence MR imaging was performed both before and after administration of intravenous contrast.  Contrast: 10mL MULTIHANCE GADOBENATE DIMEGLUMINE  529 MG/ML IV SOLN  Comparison: Plain radiographs 10/16/2011  Findings: Imaging field of view is limited to the focal area of abnormality in the left forearm.  The patient was unable to tolerate completion of the entire sequence of images.  There is a focal skin defect along the dorsal aspect  of the forearm with underlying increased T2 signal intensity and contrast enhancement throughout the posterior, medial, and lateral soft tissues.  Changes are consistent with cellulitis and edema.  There is increased signal intensity and contrast enhancement also within the underlying extensor compartment muscles consistent with myositis.  Along the posterior medial aspect of the forearm, there is fluid tracking along the deep fascial layer superficial to the muscle compartment and measuring about 0.6 x 3 cm axial dimension. There is some peripheral enhancement around this fluid collection consistent with abscess.  No focal hypointense structures to suggest foreign bodies.  IMPRESSION: Diffuse subcutaneous soft tissue edema and enhancement consistent with cellulitis.  Focal fluid collection along the posterior medial forearm with peripheral enhancement consistent with abscess. Muscular enhancement consistent with myositis.  Original Report Authenticated By: Marlon Pel, M.D.    Labs  CBC  Lab 10/22/11 0720 10/21/11 0535 10/20/11 0814  WBC 7.9 4.8 4.6  HGB 8.9* 9.0* 8.7*  HCT 27.8* 28.0* 26.8*  PLT 214 183 160   BMET  Lab 10/22/11 0720 10/21/11 0535 10/20/11 0814 10/17/11 0337  NA 141 140 139 --  K 3.7 3.7 3.7 --  CL 105 105 105 --  CO2 27 26 26  --  BUN 12 11 8  --  CREATININE 0.58 0.64 0.57 --  CALCIUM 8.3* 8.5 8.2* --  PROT 5.8* -- -- 6.8  BILITOT 0.1* -- -- 0.2*  ALKPHOS 53 -- -- 62  ALT 13 -- -- 10  AST 21 -- -- 12  GLUCOSE 97 83 68* --        Patient condition at time of discharge/disposition: stable  Disposition-home   Follow up issues: 1. Follow up left arm healing and recommendations from orthopedics.   A. Repeat CBC to make sure hgb recovering post surgery 2. Follow up pain control. Patient should no longer need IV pain medications within a week of discharge.  3. Follow up Hep C.   A. Needs to quit smoking and alcohol use. Patient plans to.   B. Needs referral  for ID clinic. Look up [facility] hepatology with black box information.   C. Consider Hep A vaccination.   D. Consider ultrasound for monitoring of development of cirrhosis.  4. Follow up blood pressure on HCTZ.   A. BMET to follow up electrolytes on medication.  5. Make sure patient has PAP smear as scheduled due to history of ASCUS 6. Follow up on TB history.    Discharge follow up:  Follow-up Information    Follow up with Sharma Covert, MD on 10/27/2011. (8 am)    Contact information:   [facility] [address] [place] [address] [phone]       Follow up with Tana Conch, MD on 11/06/2011. (please arrive by 3:15 as you are a new patient for  a 3:30 appointment. )    Contact information:   [address] [place] [address] [phone]         Discharge Orders    Future Appointments: Provider: Department: Dept Phone: Center:   11/06/2011 3:30 PM Tana Conch, MD [facility] [phone] [facility]     Future Orders  Please Complete By Expires   Diet - low sodium heart healthy      Increase activity slowly      Leave dressing on - Keep it clean, dry, and intact until clinic visit      Call MD for:  temperature >100.4      Call MD for:  severe uncontrolled pain      Call MD for:  redness, tenderness, or signs of infection (pain, swelling, redness, odor or green/yellow discharge around incision site)         Discharge Medications  Hayla, Hinger  Home Medication Instructions [id number]   Printed on:10/22/11 1421  Medication Information                    methadone (DOLOPHINE) 5 MG/5ML solution Take 60 mg by mouth.            clindamycin (CLEOCIN) 150 MG capsule Take 2 capsules (300 mg total) by mouth 4 (four) times daily.           hydrochlorothiazide (HYDRODIURIL) 25 MG tablet Take 1 tablet (25 mg total) by mouth daily.           ibuprofen (ADVIL,MOTRIN) 600 MG tablet Take 1 tablet (600 mg  total) by mouth every 6 (six) hours as needed for pain.           levofloxacin (LEVAQUIN) 750 MG tablet Take 1 tablet (750 mg total) by mouth daily.           oxyCODONE-acetaminophen (PERCOCET) 5-325 MG per tablet Take 1 tablet by mouth every 4 (four) hours as needed. (#30)           oxyCODONE (OXY IR/ROXICODONE) 5 MG immediate release tablet Take 1 tablet (5 mg total) by mouth every 4 (four) hours as needed for pain. (#5                Tana Conch, MD of [facility] 10/22/2011 2:21 PM

## 2011-10-22 NOTE — Progress Notes (Signed)
Pt removed compression wrap and loosen kerlex dressing from left arm,advised  That arm needs to be rewrapped but refused.

## 2011-10-22 NOTE — Progress Notes (Signed)
PGY-1 Daily Progress Note Family Medicine Teaching Service Ariana Jensen. Ariana Sleigh, MD Service Pager: [phone]   Subjective: Patient really hoping to go home today. Says pain is controlled on IV but hasn't been on orals. Concerned and counseled on Hep C.   Objective:  Temp:  [97.5 F (36.4 C)-99.4 F (37.4 C)] 98.3 F (36.8 C) (01/31 0510) Pulse Rate:  [55-84] 62  (01/31 0510) Resp:  [15-20] 18  (01/31 0510) BP: (127-176)/(65-98) 127/89 mmHg (01/31 0510) SpO2:  [96 %-100 %] 98 % (01/31 0510) FiO2 (%):  [2 %] 2 % (01/31 0510)  Intake/Output Summary (Last 24 hours) at 10/22/11 1016 Last data filed at 10/22/11 0534  Gross per 24 hour  Intake   1287 ml  Output      0 ml  Net   1287 ml    Gen: NAD, sitting up on side of bed  HEENT: moist mucous membranes  CV: Regular rate and rhythm, no murmurs rubs or gallops  PULM: clear to auscultation bilaterally. No wheezes/rales/rhonchi  ABD: soft/nontender/nondistended/normal bowel sounds  EXT: left arm-stapled incision on dorsum and ventral side of arm. Drains now removed. Some erythema and swelling located laterally and between both incisions. . Patient with 5/5 grip strength. erythema minimal, 15cm incision appears intact.  Patient with full range of motion in left hand. 2+ radial pulse  Neuro: Alert and oriented x3   Labs and imaging:   CBC  Lab 10/22/11 0720 10/21/11 0535 10/20/11 0814  WBC 7.9 4.8 4.6  HGB 8.9* 9.0* 8.7*  HCT 27.8* 28.0* 26.8*  PLT 214 183 160   BMET  Lab 10/22/11 0720 10/21/11 0535 10/20/11 0814 10/17/11 0337  NA 141 140 139 --  K 3.7 3.7 3.7 --  CL 105 105 105 --  CO2 27 26 26  --  BUN 12 11 8  --  CREATININE 0.58 0.64 0.57 --  CALCIUM 8.3* 8.5 8.2* --  PROT 5.8* -- -- 6.8  BILITOT 0.1* -- -- 0.2*  ALKPHOS 53 -- -- 62  ALT 13 -- -- 10  AST 21 -- -- 12  GLUCOSE 97 83 68* --     Assessment  46 y.o. year old female with cellulitis secondary to dog bite with worsening swelling, erythema and pain    1. Cellulitis with abscesss formation after dog bite now s/p extensive I+D x 2 by Dr. Orlan Leavens : "failed" treatment of Rocephin and Bactrim pt started on Vanco while in the ED.  1. OR on 1/30-closed both wounds. Arm looked much better and without purulence.  1. Ok for oral abx and pain control. Patient to keep arm wrapped and f/u Dr. Melvyn Novas in 1 week.  2. Patient on clinda/vanc/zosyn with abx now for 7 days. Wound cultures without growth but patient had already been on abx.  1. Patient needs coverage for typical cellulitis plus bacteria associated with dog bites -pasturella, salmonella, and capnocytophagia 2. Plan for Clinda and levaquin x 7 days as has already received 7 days IV abx.   1. Pharmacy attempting to see if patient can afford 4 new medications.  3. WBC not elevated. Patient afebrile.  4. Pain well controlled on morphine and dilaudid prn, motrin, and home methadone.  1. Received equivalent of approximately 3mg  dilaudid in 24 hours. Will switch to percocet 5/325 q4 hours.  2. Opioid Dependence: Pt is seen at the methadone clinic before admissionand reports having weaned from 125mg  q day to 60mg  qday. Currently patient is on 70 mg of Methadone a  day. We are continuing this in house. Her pain is adequately controlled on this regimen.  3. Hepatitis C-HEP C Ab [name] '12]. Hep C RNA PCR positive. HIV negative during previous hospitalization. Hep panel only with Hep C. May need immunization for Hep A. LFTs stable. Will need to get set up with ID. Would consider ultrasound to evaluate for cirrhosis.  1. counseled and given handout on avoiding alcohol, smoking, limit tylenol to 2g in 24 hours, household contacts and sexual activity with patient as well as that patient should not donate any blood or other body fluids.  4. Hx HTN-intermittently elevated with systolics in 150s 160s but occasionally in normal range. Will start patient on HCTZ 25mg  daily.  5. Health Maintenance (Hx of ASCUS [Oct '10], Hx  of +, Hx of TB): Pt will need importance of chronic ongoing medical care emphasized at time of discharge. Will need follow up PAP to be scheduled. Imaging in July '12 showed no X-Ray findings of active TB.  --- FEN/GI: Regular diet  --- IVFs: NS at [facility]  --- PPx: Heparin  -- Disposition: possible discharge home today after discussion with team and if patient can tolerate PO pain medication.    Tana Conch, MD PGY1, Family Medicine Teaching Service [phone]

## 2011-10-22 NOTE — Progress Notes (Signed)
DC home. Verbally understood DC summary.

## 2011-10-22 NOTE — Op Note (Signed)
[name], Jensen NO.:  [id number]  MEDICAL RECORD NO.:  [id number]  LOCATION:  5158                         FACILITY:  [facility]  PHYSICIAN:  Madelynn Done, MD  DATE OF BIRTH:  [birth year]  DATE OF PROCEDURE:  10/21/2011 DATE OF DISCHARGE:                              OPERATIVE REPORT   PREOPERATIVE DIAGNOSIS:  Left forearm abscess.  POSTOPERATIVE DIAGNOSIS:  Left forearm abscess.  ATTENDING PHYSICIAN:  Sharma Covert IV, MD, who scrubbed and present for the entire procedure.  ASSISTANT SURGEON:  None.  SURGICAL PROCEDURE: 1. Left forearm debridement of skin, subcutaneous tissue, and muscle     laterally 2. Left forearm debridement of skin, subcutaneous tissue, and muscle     Medially, 22 square cm or less.  ANESTHESIA:  General via LMA.  TOURNIQUET TIME:  0 minutes.  INTRAOPERATIVE FINDINGS:  The patient's wound looked much better.  There was mild amount of necrotic tissue but overall looked very good.  SURGICAL INDICATIONS:  Ariana Jensen is a right-hand-dominant female with a bad forearm infection.  She was taken the OR on October 18, 2011. It was recommended that she undergo repeat I and D given the severity of her infection.  Risks, benefits, and alternatives were discussed in detail with the patient and signed informed consent was obtained.  DESCRIPTION OF PROCEDURE:  The patient was properly identified in the preop holding area and mark with permanent marker made on left forearm to indicate correct operative site.  The patient was then brought back to the operating room and placed supine on anesthesia room table. General anesthesia was administered.  The patient tolerated this well. A well-padded tourniquet was then placed on left forearm and sealed with 1000 drape.  The left upper extremity was then prepped and draped in normal sterile fashion.  Time-out was called, correct site was identified, and procedure was then begun.  Attention  was then turned to the left forearm where the previous skin sutures were removed. Excisional debridement of skin, subcutaneous tissue, and muscle was carried out sharply with knife and curettes.  After excisional debridement of necrotic tissue laterally, attention was then turned medially where the same technique was then used excising the necrotic tissue, the skin, subcutaneous tissue, muscle, and fascia.  After excisional debridement, thorough wound irrigation was done with a pulsatile lavage.  Following thorough irrigation and debridement, the skin was then closed using small skin staples.  Adaptic dressing, sterile compressive bandage were then applied.  The patient tolerated the procedure well, returned to recovery room in good condition.  POSTOPERATIVE PLAN:  The patient will be continued on family medicine service on IV antibiotics and pain medicine.  He will allowed to be discharged to home once appropriate with the primary service and then follow up with me in the office in approximately 1 week.  Keep the bandage clean and dry.  I will follow her as outpatient for the assessment of her wound healing.     Madelynn Done, MD     FWO/MEDQ  D:  10/21/2011  T:  10/22/2011  Job:  [id number]

## 2011-10-23 ENCOUNTER — Encounter (HOSPITAL_COMMUNITY): Payer: Self-pay | Admitting: Orthopedic Surgery

## 2011-10-23 LAB — ANAEROBIC CULTURE: Gram Stain: NONE SEEN

## 2011-11-06 ENCOUNTER — Encounter: Payer: Self-pay | Admitting: Family Medicine

## 2011-11-06 ENCOUNTER — Ambulatory Visit (INDEPENDENT_AMBULATORY_CARE_PROVIDER_SITE_OTHER): Payer: Self-pay | Admitting: Family Medicine

## 2011-11-06 VITALS — BP 134/78 | HR 100 | Temp 98.1°F | Ht 63.0 in | Wt 123.6 lb

## 2011-11-06 DIAGNOSIS — F17200 Nicotine dependence, unspecified, uncomplicated: Secondary | ICD-10-CM

## 2011-11-06 DIAGNOSIS — B182 Chronic viral hepatitis C: Secondary | ICD-10-CM

## 2011-11-06 DIAGNOSIS — Z8611 Personal history of tuberculosis: Secondary | ICD-10-CM

## 2011-11-06 DIAGNOSIS — Z23 Encounter for immunization: Secondary | ICD-10-CM

## 2011-11-06 DIAGNOSIS — L0390 Cellulitis, unspecified: Secondary | ICD-10-CM

## 2011-11-06 DIAGNOSIS — A599 Trichomoniasis, unspecified: Secondary | ICD-10-CM

## 2011-11-06 DIAGNOSIS — I10 Essential (primary) hypertension: Secondary | ICD-10-CM

## 2011-11-06 DIAGNOSIS — L0291 Cutaneous abscess, unspecified: Secondary | ICD-10-CM

## 2011-11-06 DIAGNOSIS — Z720 Tobacco use: Secondary | ICD-10-CM | POA: Insufficient documentation

## 2011-11-06 NOTE — Assessment & Plan Note (Signed)
Can do wet prep with next pap smear. Need to inquire about partner as well.

## 2011-11-06 NOTE — Patient Instructions (Signed)
It was great to see you today!   1. I am glad your arm is healing well. Make sure to keep your follow up appointment with Dr. Thera Flake.  2. For your smoking, I want you to try to substitute smoking by using toothpicks or dum dums. Our goal would be 1/8th pack per day cut down from 1/4th pack a day by the time you see Dr. Raymondo Band  A. At the front desk, I want you to ask for a smoking appointment with Dr. Raymondo Band.   B. Another resource is 1 800 quit now.  3. I want you to do some light exercises with your left arm twice a day.  4. Keep taking your blood pressure medicine. Your blood pressure was ok today.  5. I would like to see you back in 1 month. We need to do a well woman visit with a pap smear at that time.  6. We are going to draw one lab today to check your electrolytes. You got the flu shot today.   Thanks, Dr. Durene Cal

## 2011-11-06 NOTE — Assessment & Plan Note (Addendum)
Well healing. Encouraged patient to keep ortho follow up. Will continue to monitor. No further need for abx.

## 2011-11-06 NOTE — Assessment & Plan Note (Signed)
Discussed triggers. Patient with goal of cutting back to 1/8 ppd from 1/4 by using substitution with dum dums or toothpicks. Will f/u with Dr. Raymondo Band. Also reminded of 1 800 quit now

## 2011-11-06 NOTE — Progress Notes (Signed)
  Subjective:    Patient ID: Ariana Jensen, female    DOB: 02-15-1966, 46 y.o.   MRN: [id number]  HPI66 y.o. year old female recently hospitalized cellulitis and abscess formation secondary to dog bite. 1. Cellulitis with abscesss formation after dog bite now s/p extensive I+D. Patient completed course of abx with total 14 days. Followed up at orthopedics on Tues/Wednesday of this week and had staples removed. Will follow up 1 more time in 2 weeks. Has weaned from pain meds and only occasionally takes aleve. Patient does have some soreness in her arm muscles with certain movements. Strength in fingers has been regained.   2. Opioid Dependence: methadone clinic patient who was given some narcotics due to surgery. Patient no longer requires narcotics.  3. Hepatitis C-HEP C Ab [name] '12].  Hep C RNA PCR which was positive. HIV negative during previous hospitalization. Patient encouraged to stop drinking alcohol and has been compliant. Had wanted to quit smoking but has struggled (see #4) 4. Tobacco abuse-at 1/4ppd after quitting in hospital and previously being 1 ppd. Says triggers are boredom and morning coffee. Has not tried substitution. Wants to meet with Dr. Raymondo Band. Motivated to quit.  5. HTN- Started patient on HCTZ 25mg  in hospital. Currently well controlled at 134/78 on manual check.  Review of Systems negative except as noted in HPI       Objective:   Physical Exam  Vitals reviewed. Constitutional: She is oriented to person, place, and time. She appears well-developed and well-nourished. No distress.  HENT:  Head: Normocephalic and atraumatic.  Mouth/Throat: Oropharynx is clear and moist. No oropharyngeal exudate.  Eyes: Pupils are equal, round, and reactive to light.  Neck: Normal range of motion. Neck supple.  Cardiovascular: Normal rate and regular rhythm.  Exam reveals no gallop and no friction rub.   No murmur heard. Pulmonary/Chest: Effort normal and breath sounds normal. No  respiratory distress. She has no wheezes. She has no rales. She exhibits no tenderness.  Abdominal: Soft. Bowel sounds are normal. She exhibits no distension. There is no tenderness.  Musculoskeletal: Normal range of motion. She exhibits no edema.       Arms:      Linear well healing scars noted. No erythema or exudate. Some scaling. No Decreased range of motion in LUE  Neurological: She is alert and oriented to person, place, and time.  Skin: Skin is warm and dry.          Assessment & Plan:  1. Follow up Hep C.   A. Needs to quit smoking and alcohol use. Patient plans to.   B. Needs referral for ID clinic. Look up [facility] hepatology with black box information.   C. Consider Hep A vaccination.   D. Consider ultrasound for monitoring of development of cirrhosis.  2. Pap smear next visit. Wet prep as well given history of trichomonas.

## 2011-11-06 NOTE — Assessment & Plan Note (Signed)
Not an acute history. No current symptoms. Will follow.

## 2011-11-06 NOTE — Assessment & Plan Note (Signed)
[email or web address]. Website has referral for Hepatitis clinic in [place]. Requires recent CBC, CMP, PT, INR, ALbumin, TSH as well as 2 office visits as well as recent H+P. This is patient's first visit so at next visit will obtain labs. Will also start vaccination for hepatitis A and hepatitis B.

## 2011-11-06 NOTE — Assessment & Plan Note (Signed)
Well controlled on HCTZ. Will continue and recheck at next visit.

## 2011-11-09 NOTE — Progress Notes (Signed)
Addended by: Garen Grams F on: 11/09/2011 08:30 AM   Modules accepted: Orders

## 2011-11-17 ENCOUNTER — Ambulatory Visit: Payer: Self-pay | Admitting: Pharmacist

## 2011-12-04 ENCOUNTER — Ambulatory Visit (INDEPENDENT_AMBULATORY_CARE_PROVIDER_SITE_OTHER): Payer: Self-pay | Admitting: Family Medicine

## 2011-12-04 ENCOUNTER — Encounter: Payer: Self-pay | Admitting: Family Medicine

## 2011-12-04 VITALS — BP 148/98 | HR 64 | Temp 98.5°F | Ht 63.0 in | Wt 128.0 lb

## 2011-12-04 DIAGNOSIS — R531 Weakness: Secondary | ICD-10-CM

## 2011-12-04 DIAGNOSIS — B182 Chronic viral hepatitis C: Secondary | ICD-10-CM

## 2011-12-04 DIAGNOSIS — I10 Essential (primary) hypertension: Secondary | ICD-10-CM

## 2011-12-04 DIAGNOSIS — Z720 Tobacco use: Secondary | ICD-10-CM

## 2011-12-04 DIAGNOSIS — F17200 Nicotine dependence, unspecified, uncomplicated: Secondary | ICD-10-CM

## 2011-12-04 DIAGNOSIS — R5381 Other malaise: Secondary | ICD-10-CM

## 2011-12-04 NOTE — Patient Instructions (Signed)
Dear . [name],   It was great to see you today. Thank you for coming to clinic. Please read below regarding the issues that we discussed.   1. For your blood pressure, it was slightly up today. Please take your medicine before next visit and we will recheck.  2. For your smoking, please follow up with Dr. Raymondo Band and Dr. Katrinka Blazing on Tuesday 3. The most important thing thing is for you to follow up with Rudell Cobb  -once you have the orange card, call our office to schedule a labs only visit-I will put orders in for you to be able to come for labs at that time.   -you should also call for a nurse visit for your hepatitis A and hepatitis B immunizations.   -also you can reschedule your pap smear at that time. We can talk about mammogram at that visit as well.    Please follow up in clinic in as soon as you have the orange card. Please call earlier if you have any questions or concerns.   Sincerely,  Dr. Tana Conch

## 2011-12-05 DIAGNOSIS — R531 Weakness: Secondary | ICD-10-CM | POA: Insufficient documentation

## 2011-12-05 NOTE — Progress Notes (Signed)
  Subjective:    Patient ID: Ariana Jensen, female    DOB: 01-May-1966, 46 y.o.   MRN: [id number]  HPI  Please note, patient has not yet obtained orange card. Plan was to obtain labs and pap smear today but patient would like to await orange card to ensure she is not billed at full cost for labs/vaccines/other studies.   1. Health maintenance  -plan was for pap smear today. Deferred.   -planned vaccines Hep A and Hep B deferred  2. Sexual activity-only sexually active with 1 female partner and uses condoms for birth control.   -plan was for HIV/rpr, gc, chlamydia. Deferred  3. Tobacco-plan was to cut down to 1/8th pack and meet with Dr. Raymondo Band at last visit as well as call 1-800 quit now. Patient missed first appointment but has rescheduled for next Tuesday. Patient tried toothpicks and dum dums but this only works for about an hour for her. She is excited to meet with our pharmacist and hopes to be able to quit. She has been educated in the past that this is important for her hepatitis C and she continues to understand this.   4. HTN-did not take medication this morning because she was busy. Typically is able to take. Denies CP/SOB/HA/blurry vision/orthopnea.  5. Low energy-patient describes low energy for 3-4 months. Describes heavy painful periods on monthly basis. aslso constipated regularly. Plan was for labs today including TSH but will defer at this time. No changes in hair or nail changes.   Review of Systems negative except as noted in HPI   Objective:   Physical Exam BP 148/98  Pulse 64  Temp(Src) 98.5 F (36.9 C) (Oral)  Ht 5\' 3"  (1.6 m)  Wt 128 lb (58.06 kg)  BMI 22.67 kg/m2  LMP 11/27/2011 Constitutional: She is oriented to person, place, and time. She appears well-developed and well-nourished. No distress.  Head: Normocephalic and atraumatic.  Mouth/Throat: Oropharynx is clear and moist. No oropharyngeal exudate.  Eyes: Pupils are equal, round, and reactive to light.    Neck: Normal range of motion. Neck supple.  Cardiovascular: Normal rate and regular rhythm.  Exam reveals no gallop and no friction rub.  No murmur heard. Pulmonary/Chest: Effort normal and breath sounds normal. No respiratory distress. She has no wheezes. She has no rales. She exhibits no tenderness.  Abdominal: Soft. Bowel sounds are normal. She exhibits no distension. There is no tenderness.  Musculoskeletal: Normal range of motion. She exhibits no edema.       Arms: Linear well healing scars noted. No erythema or exudate. Some scaling. No Decreased range of motion in LUE . Patient reported weakness not reproduced on exam.  Neurological: She is alert and oriented to person, place, and time.  Skin: Skin is warm and dry.     Assessment & Plan:

## 2011-12-05 NOTE — Assessment & Plan Note (Addendum)
Plan was to obtain labs today to attempt to get patient into hepatology ID clinic in [place]. Referral form at [email or web address]. Requires recent CBC, CMP, PT, INR, ALbumin, TSH as well as 2 office visits as well as recent H+P. Labs deferred today due to orange card as well as Hep A and hep B vaccines.  Patient to cal for lab visit once has orange card. Labs entered as future order.   Will also obtain HIV/RPR as patient has not had STD testing in over a year.

## 2011-12-05 NOTE — Assessment & Plan Note (Signed)
No red flags. Asked patient to take medicine before next visit so we can evaluate effectiveness of therapy.

## 2011-12-05 NOTE — Assessment & Plan Note (Signed)
DDx includes anemia, hypothyroidism among others. Will obtain labwork at next visit once patient has orange card. Will enter as future orders.

## 2011-12-05 NOTE — Assessment & Plan Note (Signed)
Will follow up with Dr. Raymondo Band on Tuesday of next week. Rest of plan per AVS.

## 2011-12-08 ENCOUNTER — Ambulatory Visit: Payer: Self-pay | Admitting: Pharmacist

## 2011-12-28 ENCOUNTER — Other Ambulatory Visit: Payer: Self-pay

## 2011-12-28 ENCOUNTER — Ambulatory Visit (INDEPENDENT_AMBULATORY_CARE_PROVIDER_SITE_OTHER): Payer: Self-pay | Admitting: *Deleted

## 2011-12-28 VITALS — Temp 98.4°F

## 2011-12-28 DIAGNOSIS — B182 Chronic viral hepatitis C: Secondary | ICD-10-CM

## 2011-12-28 DIAGNOSIS — Z23 Encounter for immunization: Secondary | ICD-10-CM

## 2011-12-28 LAB — CBC
HCT: 38.5 % (ref 36.0–46.0)
MCHC: 30.9 g/dL (ref 30.0–36.0)
RDW: 16.8 % — ABNORMAL HIGH (ref 11.5–15.5)

## 2011-12-28 LAB — COMPREHENSIVE METABOLIC PANEL
ALT: 10 U/L (ref 0–35)
AST: 16 U/L (ref 0–37)
Albumin: 4.2 g/dL (ref 3.5–5.2)
Alkaline Phosphatase: 57 U/L (ref 39–117)
BUN: 20 mg/dL (ref 6–23)
CO2: 24 mEq/L (ref 19–32)
Calcium: 9.5 mg/dL (ref 8.4–10.5)
Chloride: 103 mEq/L (ref 96–112)
Creat: 0.75 mg/dL (ref 0.50–1.10)
Glucose, Bld: 88 mg/dL (ref 70–99)
Potassium: 4 mEq/L (ref 3.5–5.3)
Sodium: 136 mEq/L (ref 135–145)
Total Bilirubin: 0.3 mg/dL (ref 0.3–1.2)
Total Protein: 7.6 g/dL (ref 6.0–8.3)

## 2011-12-28 LAB — PROTIME-INR
INR: 0.95 (ref <1.50)
Prothrombin Time: 13.1 seconds (ref 11.6–15.2)

## 2011-12-28 LAB — TSH: TSH: 1.534 u[IU]/mL (ref 0.350–4.500)

## 2011-12-28 MED ORDER — HEPATITIS A VACCINE 1440 EL U/ML IM SUSP: 1.0000 mL | Freq: Once | INTRAMUSCULAR | Status: DC

## 2011-12-28 NOTE — Progress Notes (Signed)
Labs done today Dionisios Ricci 

## 2011-12-28 NOTE — Progress Notes (Signed)
Addended by: Orvil Feil L on: 12/28/2011 02:29 PM   Modules accepted: Orders

## 2011-12-29 LAB — RPR

## 2012-01-01 ENCOUNTER — Encounter: Payer: Self-pay | Admitting: Pharmacist

## 2012-01-01 ENCOUNTER — Ambulatory Visit (INDEPENDENT_AMBULATORY_CARE_PROVIDER_SITE_OTHER): Payer: Self-pay | Admitting: Pharmacist

## 2012-01-01 VITALS — BP 143/104 | HR 67 | Ht 63.0 in | Wt 123.0 lb

## 2012-01-01 DIAGNOSIS — Z720 Tobacco use: Secondary | ICD-10-CM

## 2012-01-01 DIAGNOSIS — F17200 Nicotine dependence, unspecified, uncomplicated: Secondary | ICD-10-CM

## 2012-01-01 NOTE — Assessment & Plan Note (Signed)
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  Patient is willing to pay for Varenicline (out of pocket) to get started.   Patient will call pharmacies in the area to determine the cheapest Chantix starting month pack and call back with Pharmacy of choice.  She will also follow up with [place] [facility] and get started on Chantix continuing month pack supply.  Initiated varenicline tx with starting pack and then [facility] supplied continuing month pack.  Patient counseled on purpose, proper use, and potential adverse effects, including GI upset, CV risk and potential change in mood.   Written information provided.  F/U phone call to patient [phone].  F/U Rx Clinic Visit 1-2 weeks after next visit with Dr. Durene Cal   Total time in face-to-face counseling 75 minutes.  Patient seen with Tana Conch, PharmD, Pharmacy Resident.

## 2012-01-01 NOTE — Patient Instructions (Signed)
Determine if you can afford the "Chantix Starting Month Pack" Call pharmacies in the area AND call Dr. Raymondo Band with pharmacy choice at [phone] Go to [facility] (medication assistance program) and fill out application for Chantix.  Follow up by phone on quit date via phone call on or shortly after your quit date [phone]

## 2012-01-01 NOTE — Progress Notes (Signed)
  Subjective:    Patient ID: Ariana Jensen, female    DOB: 1966/07/28, 46 y.o.   MRN: [id number]  HPI  Number of Cigarettes per day 20. Brand smoked [employer]  (Previously smoked Marlboro).   Estimated Nicotine Content per Cigarette (mg) 1.  Estimated Nicotine intake per day >20mg .   Smokes first cigarette <5 minutes after waking. Smokes 1-2 times per night .    Estimated Fagerstrom Score >6/10.  Most recent quit attempt:  During hospitalization for dog bite infection. Longest time ever been tobacco free 6 months during incarceration. What Medications (NRT, bupropion, varenicline) used in past includes NONE Rates IMPORTANCE of quitting tobacco on 1-10 scale of 10. Rates READINESS of quitting tobacco on 1-10 scale of 10. Rates CONFIDENCE of quitting tobacco on 1-10 scale of 6.  Motivation is currently high as patient realizes the smell of cigarette smoke, and is worried about all the death around her due to tobacco.   Mother died of lung cancer September 22, 2010, Father died of lung cancer  Feb 21, 2007, and husband died of MI Feb 20, 2009.   Triggers to use tobacco include; With Coffee first thing in AM, being around other smokers, after meals, boredom, stress, with handling triggers (quick to get upset)  History of polysubstance abuse including Heroin since age 43 and Cocaine since age 28.    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  Patient is willing to pay for Varenicline (out of pocket) to get started.   Patient will call pharmacies in the area to determine the cheapest Chantix starting month pack and call back with Pharmacy of choice.  She will also follow up with [place] [facility] and get started on Chantix continuing month pack supply.  Initiated  varenicline tx with starting pack and then [facility] supplied continuing month pack.  Patient counseled on purpose, proper use, and potential adverse effects, including GI upset, CV risk and potential change in mood.   Written information provided.  F/U phone call to patient [phone].  F/U Rx Clinic Visit 1-2 weeks after next visit with Dr. Durene Cal   Total time in face-to-face counseling 75 minutes.  Patient seen with Tana Conch, PharmD, Pharmacy Resident.

## 2012-01-01 NOTE — Progress Notes (Signed)
  Subjective:    Patient ID: Ariana Jensen, female    DOB: 12-10-1965, 45 y.o.   MRN: [id number]  HPI  Reviewed and agree with Dr. Macky Lower management.    Review of Systems     Objective:   Physical Exam        Assessment & Plan:

## 2012-01-04 ENCOUNTER — Telehealth: Payer: Self-pay | Admitting: Pharmacist

## 2012-01-04 MED ORDER — VARENICLINE TARTRATE 1 MG PO TABS: 1.0000 mg | ORAL_TABLET | Freq: Two times a day (BID) | ORAL | Status: DC

## 2012-01-04 MED ORDER — VARENICLINE TARTRATE 0.5 MG X 11 & 1 MG X 42 PO MISC: ORAL | Status: DC

## 2012-01-04 NOTE — Telephone Encounter (Signed)
Patient stated that she will USE [facility] for then entire prescription.  She will fill out paperwork tomorrow at [facility] and expects to obtain Chanitx in 6-8 weeks.   Advised I would call in both Rxs for patient.   Delayed quit date due to lack of med supply.   Called Starting month pack AND continuing month pack to [facility] to ensure availability of prescription tomorrow.

## 2012-01-06 ENCOUNTER — Ambulatory Visit: Payer: Self-pay | Admitting: Family Medicine

## 2012-01-06 ENCOUNTER — Telehealth: Payer: Self-pay | Admitting: Pharmacist

## 2012-01-06 NOTE — Telephone Encounter (Signed)
Patient called my office Asked to return call to a cell Number that did NOT have any Voice Mail specifying name.  SEcond cell phone was her friend who said she has an appt today with Dr. Durene Cal.  Left message for her to contact me during visit if needed.

## 2012-01-25 ENCOUNTER — Encounter: Payer: Self-pay | Admitting: Family Medicine

## 2012-01-25 ENCOUNTER — Other Ambulatory Visit (HOSPITAL_COMMUNITY)
Admission: RE | Admit: 2012-01-25 | Discharge: 2012-01-25 | Disposition: A | Discharge status: Home / Self Care | Payer: Self-pay | Source: Ambulatory Visit | Attending: Family Medicine | Admitting: Family Medicine

## 2012-01-25 ENCOUNTER — Ambulatory Visit (INDEPENDENT_AMBULATORY_CARE_PROVIDER_SITE_OTHER): Payer: Self-pay | Admitting: Family Medicine

## 2012-01-25 VITALS — BP 143/86 | HR 67 | Temp 98.3°F | Ht 63.0 in | Wt 126.0 lb

## 2012-01-25 DIAGNOSIS — K219 Gastro-esophageal reflux disease without esophagitis: Secondary | ICD-10-CM

## 2012-01-25 DIAGNOSIS — Z124 Encounter for screening for malignant neoplasm of cervix: Secondary | ICD-10-CM

## 2012-01-25 DIAGNOSIS — I10 Essential (primary) hypertension: Secondary | ICD-10-CM

## 2012-01-25 DIAGNOSIS — Z01419 Encounter for gynecological examination (general) (routine) without abnormal findings: Secondary | ICD-10-CM | POA: Insufficient documentation

## 2012-01-25 DIAGNOSIS — F1120 Opioid dependence, uncomplicated: Secondary | ICD-10-CM

## 2012-01-25 DIAGNOSIS — N920 Excessive and frequent menstruation with regular cycle: Secondary | ICD-10-CM | POA: Insufficient documentation

## 2012-01-25 DIAGNOSIS — Z113 Encounter for screening for infections with a predominantly sexual mode of transmission: Secondary | ICD-10-CM | POA: Insufficient documentation

## 2012-01-25 DIAGNOSIS — Z720 Tobacco use: Secondary | ICD-10-CM

## 2012-01-25 DIAGNOSIS — A599 Trichomoniasis, unspecified: Secondary | ICD-10-CM

## 2012-01-25 DIAGNOSIS — B182 Chronic viral hepatitis C: Secondary | ICD-10-CM

## 2012-01-25 DIAGNOSIS — F17200 Nicotine dependence, unspecified, uncomplicated: Secondary | ICD-10-CM

## 2012-01-25 LAB — POCT WET PREP (WET MOUNT): Clue Cells Wet Prep Whiff POC: NEGATIVE

## 2012-01-25 MED ORDER — VARENICLINE TARTRATE 0.5 MG X 11 & 1 MG X 42 PO MISC: ORAL | Status: DC

## 2012-01-25 MED ORDER — ESOMEPRAZOLE MAGNESIUM 40 MG PO PACK: 40.0000 mg | PACK | Freq: Every day | ORAL | Status: DC

## 2012-01-25 MED ORDER — LISINOPRIL 5 MG PO TABS: 5.0000 mg | ORAL_TABLET | Freq: Every day | ORAL | Status: DC

## 2012-01-25 MED ORDER — VARENICLINE TARTRATE 1 MG PO TABS: 1.0000 mg | ORAL_TABLET | Freq: Two times a day (BID) | ORAL | Status: DC

## 2012-01-25 NOTE — Patient Instructions (Addendum)
Dear [name],   It was great to see you today. Thank you for coming to clinic. Please read below regarding the issues that we discussed.   1. For your high blood pressure, we are still not quite at goal of being less than 140/90. I am starting you on a new medication Lisinopril today. I would like to see you back in 2-4 weeks to recheck your blood pressure and check your electrolytes (blood test). If you do not use protection for sex in the future, we should start you on a different medication. Also, you should stop Lisinopril if you become pregnant.  2. For your medications from [facility], please ask them to send the forms to [place] again as I have not received them.  3. We can discuss the results of your pap smear and other woman exam at your next visit.  4. For your low energy, I think it is reasonable for you to continue to take the iron supplement.  5. I think your energy may also be related to your heavy periods. I would like for you to have an ultrasound completed to see if we can find a cause.   Please follow up in clinic in 3 weeks . Please call earlier if you have any questions or concerns.   Sincerely,  Dr. Tana Conch

## 2012-01-26 NOTE — Assessment & Plan Note (Signed)
Long history and acutely worsened over last 4 months. Labs for Hep C have overlap and are unremarkable including CBC, TSH, CMP, PT/INR. Will obtain transvaginal ultrasound. Likely after ultrasound that we will need to obtain an endometrial biopsy given increased flow after age 45.

## 2012-01-26 NOTE — Assessment & Plan Note (Signed)
Plans for Chantix but working on obtaining from [facility]. This is delaying her start date. She is VERY motivated to quit.

## 2012-01-26 NOTE — Assessment & Plan Note (Signed)
Patient continues to do excellently with weaning process. She is now down to 40mg . She is thrilled with her progress. I congratulated  Patient on her continued recovery.

## 2012-01-26 NOTE — Assessment & Plan Note (Signed)
Started patient on Lisinopril as she is still not at goal. Back in 2 weeks to recheck BP and check electrolytes.

## 2012-01-26 NOTE — Progress Notes (Addendum)
Addended by: Shelva Majestic on: 01/26/2012 04:51 PM  Modules accepted: Orders   Reopened visit to reprint rx for Chantix and Protonix for [facility] program through [facility].   Tana Conch, MD, PGY1 01/28/2012 8:51 AM

## 2012-01-26 NOTE — Progress Notes (Signed)
  Subjective:    Patient ID: Ariana Jensen, female    DOB: [birth year], 46 y.o.   MRN: [id number]  HPI  1. Hepatitis C-informed patient we would send referral to [facility] hepatology today. Uncertain if they will accept orange card as insurance. Patient is aware. She received first shot of Hep A and Hep B at last visit. She is trying to quit smoking. She is abstaining from alcohol and tylenol.   2. Smoking-waiting on Chantix to be available from [facility]. Highly motivated to quit.   3. HTN-says BP has run high when she has checked it at methadone clinic. Some days 120/80 some days as high as 160/100. WIlling to start 2nd medication. Says does occasionally get lightheaded with standing but no chest pain, shortness of breath, edema, HA, Blurry vision.   4. Low energy-reviewed labs with patient and noted that hemoglobin only slightly low. Has started taking iron supplements.  See #5  5. Menorrhagia-believe low energy could be related to almost constant periods. Patient says periods in last 4 months have been every 2-3 weeks lasting about 2 weeks. Heavier than normal and she can bleed through 3 pads every hour at beginning of them. SHe is drained by them and can barely get out of bed due to cramping and low energy.   Review of Systems -See HPI  Past Medical History-smoking status noted: current smoker. Reviewed problem list.  Medications- reviewed and updated Chief complaint-noted    Objective:   Physical Exam  Constitutional: She is oriented to person, place, and time. She appears well-developed and well-nourished. No distress.  HENT:  Head: Normocephalic and atraumatic.  Eyes: Conjunctivae and EOM are normal.  Neck: Normal range of motion. Neck supple.  Cardiovascular: Normal rate and regular rhythm.  Exam reveals no gallop and no friction rub.   No murmur heard. Pulmonary/Chest: Effort normal and breath sounds normal. Right breast exhibits no inverted nipple, no mass, no nipple discharge, no skin  change and no tenderness. Left breast exhibits no inverted nipple, no mass, no nipple discharge, no skin change and no tenderness. Breasts are symmetrical.  Abdominal: Soft. Bowel sounds are normal.  Genitourinary: Vagina normal. No vaginal discharge found.  Musculoskeletal: Normal range of motion. She exhibits no edema.  Neurological: She is alert and oriented to person, place, and time. Coordination normal.  Skin: Skin is warm and dry. No rash noted.      Assessment & Plan:  Pap smear obtained.

## 2012-01-26 NOTE — Assessment & Plan Note (Addendum)
Patient requested retest for trichomonas and this was negative on wet prep. Patient denies any current partners. Updated patient by phone. Also obtained GC/Chlamydia.

## 2012-01-26 NOTE — Assessment & Plan Note (Signed)
Obtained labs at previous visit for hepatology referral at [facility]. Patient has obtained orange card. Will send referral at this time. Patient has now received Hepatitis A and B vaccine first dose. Scheduled for follow up doses of both.

## 2012-01-27 ENCOUNTER — Ambulatory Visit: Payer: Self-pay

## 2012-01-28 ENCOUNTER — Ambulatory Visit (HOSPITAL_COMMUNITY)
Admission: RE | Admit: 2012-01-28 | Discharge: 2012-01-28 | Disposition: A | Discharge status: Home / Self Care | Payer: Self-pay | Source: Ambulatory Visit | Attending: Family Medicine | Admitting: Family Medicine

## 2012-01-28 ENCOUNTER — Ambulatory Visit (INDEPENDENT_AMBULATORY_CARE_PROVIDER_SITE_OTHER): Payer: Self-pay | Admitting: *Deleted

## 2012-01-28 VITALS — Temp 98.4°F

## 2012-01-28 DIAGNOSIS — Z23 Encounter for immunization: Secondary | ICD-10-CM

## 2012-01-28 DIAGNOSIS — K219 Gastro-esophageal reflux disease without esophagitis: Secondary | ICD-10-CM | POA: Insufficient documentation

## 2012-01-28 DIAGNOSIS — N949 Unspecified condition associated with female genital organs and menstrual cycle: Secondary | ICD-10-CM | POA: Insufficient documentation

## 2012-01-28 DIAGNOSIS — N920 Excessive and frequent menstruation with regular cycle: Secondary | ICD-10-CM

## 2012-01-28 DIAGNOSIS — N938 Other specified abnormal uterine and vaginal bleeding: Secondary | ICD-10-CM | POA: Insufficient documentation

## 2012-01-28 MED ORDER — ESOMEPRAZOLE MAGNESIUM 40 MG PO PACK: 40.0000 mg | PACK | Freq: Every day | ORAL | Status: DC

## 2012-01-28 MED ORDER — VARENICLINE TARTRATE 0.5 MG X 11 & 1 MG X 42 PO MISC: ORAL | Status: DC

## 2012-01-28 MED ORDER — VARENICLINE TARTRATE 1 MG PO TABS: 1.0000 mg | ORAL_TABLET | Freq: Two times a day (BID) | ORAL | Status: DC

## 2012-01-28 NOTE — Assessment & Plan Note (Signed)
Patient started taking OTC medicine as she noticed "heartburn" at home but can receive Nexium for free through [facility] so will write Rx.

## 2012-01-28 NOTE — Progress Notes (Signed)
Addended by: Shelva Majestic on: 01/28/2012 08:57 AM   Modules accepted: Orders

## 2012-01-28 NOTE — Progress Notes (Signed)
Addended by: Shelva Majestic on: 01/28/2012 08:59 AM   Modules accepted: Orders

## 2012-01-29 ENCOUNTER — Telehealth: Payer: Self-pay | Admitting: Family Medicine

## 2012-01-29 ENCOUNTER — Ambulatory Visit: Payer: Self-pay | Admitting: Family Medicine

## 2012-01-29 NOTE — Telephone Encounter (Signed)
Has had severe pain in her lower back and abdomen, to the point that she did not sleep at all last night.  She would like to be seen today.

## 2012-01-29 NOTE — Telephone Encounter (Signed)
Patient states she had pelvic ultrasound yesterday and last night about 6:00 she started having severe pain in lower abdomen and back. . States it has improved some this AM with her pain meds. Offered appointment .  Scheduled for 1:30 with Dr. Aviva Signs . She will call back if she is feeling better and cancel.Marland Kitchen

## 2012-02-16 ENCOUNTER — Ambulatory Visit (INDEPENDENT_AMBULATORY_CARE_PROVIDER_SITE_OTHER): Payer: Self-pay | Admitting: Family Medicine

## 2012-02-16 VITALS — BP 147/91 | HR 63 | Temp 98.2°F | Ht 63.0 in | Wt 126.0 lb

## 2012-02-16 DIAGNOSIS — Z720 Tobacco use: Secondary | ICD-10-CM

## 2012-02-16 DIAGNOSIS — F17200 Nicotine dependence, unspecified, uncomplicated: Secondary | ICD-10-CM

## 2012-02-16 DIAGNOSIS — B182 Chronic viral hepatitis C: Secondary | ICD-10-CM

## 2012-02-16 DIAGNOSIS — N920 Excessive and frequent menstruation with regular cycle: Secondary | ICD-10-CM

## 2012-02-16 DIAGNOSIS — I10 Essential (primary) hypertension: Secondary | ICD-10-CM

## 2012-02-16 LAB — COMPREHENSIVE METABOLIC PANEL
Albumin: 3.9 g/dL (ref 3.5–5.2)
BUN: 26 mg/dL — ABNORMAL HIGH (ref 6–23)
CO2: 27 mEq/L (ref 19–32)
Calcium: 9.4 mg/dL (ref 8.4–10.5)
Glucose, Bld: 76 mg/dL (ref 70–99)
Potassium: 4.3 mEq/L (ref 3.5–5.3)
Sodium: 138 mEq/L (ref 135–145)
Total Protein: 6.9 g/dL (ref 6.0–8.3)

## 2012-02-16 LAB — CBC
HCT: 37.6 % (ref 36.0–46.0)
Hemoglobin: 12.4 g/dL (ref 12.0–15.0)
RBC: 4.36 MIL/uL (ref 3.87–5.11)

## 2012-02-16 MED ORDER — LISINOPRIL 10 MG PO TABS: 5.0000 mg | ORAL_TABLET | Freq: Every day | ORAL | Status: DC

## 2012-02-16 NOTE — Patient Instructions (Signed)
Dear Ariana Jensen,   It was great to see you today. Thank you for coming to clinic. Please read below regarding the issues that we discussed.   1. We are going to send you for an MRI of your pelvis to look for adenomyosis.  2. After your MRI, we are going to have you get an appointment here at women's clinic to discuss your MRI, to get an endometrial biopsy (sample of cells from inside of your uterus) and place an IUD (a form of birth control which helps pain in adenomyosis).  A. Before this visit, I want you to take 800mg  of Ibuprofen 1 hour before your visit.   B. Schedule your women's clinic visit at least 1 week after the MRI.  3. For your blood pressure, I am going to increase your Lisinopril to 10mg .   Please follow up in clinic after your MRI . Please call earlier if you have any questions or concerns.   Sincerely,  Dr. Tana Conch

## 2012-02-17 ENCOUNTER — Encounter: Payer: Self-pay | Admitting: Family Medicine

## 2012-02-17 NOTE — Progress Notes (Signed)
  Subjective:    Patient ID: Ariana Jensen, female    DOB: 1965-12-19, 46 y.o.   MRN: [id number]  HPI  1. Menorrhagia-has been 2 weeks without a period for first time in months. Period started last Mon and went through Wednesday, flow stopped on Thursday, then restarted Friday-Monday. Disabling pain associated with periods. Pelvic U/s normal-reviewed with patient. Had tried to contact patient to get her to take NSAIDs before visit for endometrial bx but she has a new #. Had not taken NSAIDs and says period still causing her some residual pain so would like to defer treatment.   2. Tobacco abuse-still waiting on chantix from [facility]. Very motivated to quit. She is going to call [facility] contact today to see if things are processed  3. Hypertension- BP Readings from Last 3 Encounters:  02/16/12 147/91  01/25/12 143/86  01/01/12 143/104   Home BP monitoring-no Compliant with medications-yes without side effects. Takes everyday and does not miss doses.  Denies any CP, HA, SOB, blurry vision, LE edema, transient weakness, orthopnea, PND.    Review of Systems -See HPI  Past Medical History-smoking status noted: current smoker.  Reviewed problem list.  Medications- reviewed and updated Chief complaint-noted    Objective:   Physical Exam Constitutional: She is oriented to person, place, and time. She appears well-developed and well-nourished. No distress.  Head: Normocephalic and atraumatic.  Eyes: Conjunctivae and EOM are normal.  Neck: Normal range of motion. Neck supple.  Cardiovascular: Normal rate and regular rhythm.  Exam reveals no gallop and no friction rub.   No murmur heard. Pulmonary/Chest: Effort normal and breath sounds normal.Abdominal: Soft. Bowel sounds are normal.  Musculoskeletal: Normal range of motion. She exhibits no edema. Scar on left arm noted from surgery within last year.  Neurological: She is alert and oriented to person, place, and time. Coordination normal.  Skin:  Skin is warm and dry. No rash noted.     Assessment & Plan:  Also updated patient on normal pap and negative STD testing.

## 2012-02-17 NOTE — Assessment & Plan Note (Addendum)
Appears to have appt for august with hepatitis clinic. Will need repeat hep a and hep b doses.   LFTs stable.

## 2012-02-17 NOTE — Assessment & Plan Note (Addendum)
Will increase Lisinopril to 10mg . Have seen minimal improvement with addition of 2 agents, may potentially need a 3rd agent. If resistant to 3rd agent, would consider secondary causes. Cr stable.

## 2012-02-17 NOTE — Assessment & Plan Note (Addendum)
Increased flow in female >40. Pelvic U/s normal. Will send for Pelvic MRI planned-suspect adenomyosis. F/u women's clinic for endometrial biopsy and likely IUD placement after MRI. Have discussed plan with Dr. Jennette Kettle.   CBC wnl.

## 2012-02-17 NOTE — Assessment & Plan Note (Signed)
Highly motivated to quit. Still waiting on chantix through [facility]. Patient to call if any difficulties reaching [facility].

## 2012-02-18 ENCOUNTER — Encounter: Payer: Self-pay | Admitting: Family Medicine

## 2012-02-18 ENCOUNTER — Ambulatory Visit (INDEPENDENT_AMBULATORY_CARE_PROVIDER_SITE_OTHER): Payer: Self-pay | Admitting: Family Medicine

## 2012-02-18 ENCOUNTER — Ambulatory Visit (HOSPITAL_COMMUNITY)
Admission: RE | Admit: 2012-02-18 | Discharge: 2012-02-18 | Disposition: A | Discharge status: Home / Self Care | Payer: Self-pay | Source: Ambulatory Visit | Attending: Family Medicine | Admitting: Family Medicine

## 2012-02-18 VITALS — BP 141/102 | HR 73 | Temp 98.0°F | Ht 63.0 in | Wt 123.0 lb

## 2012-02-18 DIAGNOSIS — N920 Excessive and frequent menstruation with regular cycle: Secondary | ICD-10-CM

## 2012-02-18 DIAGNOSIS — N949 Unspecified condition associated with female genital organs and menstrual cycle: Secondary | ICD-10-CM

## 2012-02-18 DIAGNOSIS — R102 Pelvic and perineal pain: Secondary | ICD-10-CM

## 2012-02-18 DIAGNOSIS — R1031 Right lower quadrant pain: Secondary | ICD-10-CM | POA: Insufficient documentation

## 2012-02-18 DIAGNOSIS — R1030 Lower abdominal pain, unspecified: Secondary | ICD-10-CM | POA: Insufficient documentation

## 2012-02-18 LAB — POCT URINE PREGNANCY: Preg Test, Ur: NEGATIVE

## 2012-02-18 MED ORDER — KETOROLAC TROMETHAMINE 30 MG/ML IM SOLN: 30.0000 mg | Freq: Once | INTRAMUSCULAR | Status: DC

## 2012-02-18 MED ORDER — KETOROLAC TROMETHAMINE 30 MG/ML IJ SOLN
30.0000 mg | Freq: Once | INTRAMUSCULAR | Status: AC
  Administered 2012-02-18: 30 mg via INTRAMUSCULAR

## 2012-02-18 NOTE — Patient Instructions (Signed)
I am sorry your pain is so bad. We did not find a reason for your pain in the office. We gave you a shot of Toradol to help. I do not want you to take any other pain medicine today since you have taken aleve and we are giving you the Toradol. I want you to keep your MRI appointment today. I will call you tonight or tomorrow when results are available and tell you what we should do for the next steps.   If you were to develop worsening pian, fevers, chills, nausea, vomiting, you can call us [phone] and speak to the on call doctor or go to the emergency room.   Thanks,  Dr. Durene Cal

## 2012-02-18 NOTE — Progress Notes (Signed)
Addended by: Shelva Majestic on: 02/18/2012 11:18 AM   Modules accepted: Orders

## 2012-02-18 NOTE — Progress Notes (Signed)
  Subjective:    Patient ID: Ariana Jensen, female    DOB: 07/28/1966, 46 y.o.   MRN: [id number]  HPI  Patient seem on Tuesday for menorrhagia/dysmennorhea.   Presents today with worsening RLQ pain. Woke up at 2 am with 10/10 RLQ pain, throbbing in nature. Says pain feels exactly like ectopic pregnancy she had in 2010. She had a left salpingectomy due to that pregnancy.  The pain has been constant since that time. She went to Methadone clinic at 5:30 am and received her dose of methadone. Pain only down to 8/10 an hour later. No fevers or chills/nausea/vomiting/dyuria/polyuria. She took 2 aleve with little relief. LMP last Tuesday which was very painful but not as painful as current pain. She denies any sexual partners.   Labs-reviewed and GC was negative on 01/25/12.    Review of Systems -See HPI  Past Medical History-smoking status noted: current smoker. Hx Hep C. HTN. History ectopic pregnancy in 2010.   Reviewed problem list.  Medications- reviewed and updated Chief complaint-noted    Objective:   Physical Exam  Constitutional: She is oriented to person, place, and time. She appears well-developed and well-nourished. She appears distressed (patient in visible pain on exam table mainly exhibited by facial features. ).  HENT:  Head: Normocephalic and atraumatic.  Mouth/Throat: Oropharynx is clear and moist.  Eyes: Conjunctivae and EOM are normal. Pupils are equal, round, and reactive to light.  Neck: Normal range of motion. Neck supple.  Cardiovascular: Normal rate, regular rhythm and intact distal pulses.  Exam reveals no gallop and no friction rub.   No murmur heard. Pulmonary/Chest: Effort normal and breath sounds normal. No respiratory distress. She has no wheezes. She has no rales.  Abdominal: Soft. Bowel sounds are normal. She exhibits no distension, no pulsatile liver, no fluid wave, no abdominal bruit and no mass. There is tenderness in the right lower quadrant. There is no  rigidity, no rebound, no guarding, no CVA tenderness and no tenderness at McBurney's point. No hernia.         Straight leg raise negative.   Genitourinary: Vagina normal. No vaginal discharge found.       Pelvic exam performed. No cervical motion tenderness.   Musculoskeletal: Normal range of motion. She exhibits no edema.  Neurological: She is alert and oriented to person, place, and time.  Skin: Skin is warm and dry.          Assessment & Plan:

## 2012-02-18 NOTE — Assessment & Plan Note (Signed)
DDx includes ovarian torsion, ectopic pregnancy, appendicitis, diverticulitis, dysmenorrhea, constipation, adenomyosis, endometriosis, PID, UTI.   Patient had a pelvic MRI planned for 1:45 today so have encouraged patient to keep appointment. I am encouraged that patient was able to tolerate pelvic and abdominal exams so well despite reported pain so believe she will be able to tolerate pain until MRI. Will give 1 IM shot of toradol in office. Plan to avoid narcotics due to opiate dependence and patient desire to continue wean on Methadone.   Doubt infectious source given lack of fevers, nausea, vomiting. Planned to get CBC but patient left before ordered. Concerning PID, CMT negative and GC negative within the last month. Doubt appendicitis given no systemic signs-fever/nausea/vomiting and negative burney's point and straight leg raise as well as no rebound/guarding.   Upreg negative so do not suspect ectopic pregnancy. Pelvic MRI should help assess for ovarian torsion and believe the MRI would be completed sooner than trying to schedule repeat u/s.   Patient with BM yesterday so doubt constipation. Diverticultis typically left sided and once again no fever/systemic signs. No dysuria/polyuria for concern UTI.

## 2012-02-19 ENCOUNTER — Encounter (HOSPITAL_COMMUNITY): Payer: Self-pay

## 2012-02-19 ENCOUNTER — Emergency Department (HOSPITAL_COMMUNITY)
Admission: EM | Admit: 2012-02-19 | Discharge: 2012-02-19 | Disposition: A | Discharge status: Home Health | Payer: Self-pay | Attending: Emergency Medicine | Admitting: Emergency Medicine

## 2012-02-19 ENCOUNTER — Emergency Department (HOSPITAL_COMMUNITY): Payer: Self-pay

## 2012-02-19 DIAGNOSIS — K567 Ileus, unspecified: Secondary | ICD-10-CM

## 2012-02-19 DIAGNOSIS — R1031 Right lower quadrant pain: Secondary | ICD-10-CM | POA: Insufficient documentation

## 2012-02-19 DIAGNOSIS — I10 Essential (primary) hypertension: Secondary | ICD-10-CM | POA: Insufficient documentation

## 2012-02-19 DIAGNOSIS — R109 Unspecified abdominal pain: Secondary | ICD-10-CM

## 2012-02-19 DIAGNOSIS — G8929 Other chronic pain: Secondary | ICD-10-CM | POA: Insufficient documentation

## 2012-02-19 DIAGNOSIS — K560 Paralytic ileus: Secondary | ICD-10-CM | POA: Insufficient documentation

## 2012-02-19 LAB — URINALYSIS, ROUTINE W REFLEX MICROSCOPIC
Ketones, ur: NEGATIVE mg/dL
Leukocytes, UA: NEGATIVE
Nitrite: NEGATIVE
Specific Gravity, Urine: 1.038 — ABNORMAL HIGH (ref 1.005–1.030)
pH: 5.5 (ref 5.0–8.0)

## 2012-02-19 LAB — DIFFERENTIAL
Basophils Absolute: 0 10*3/uL (ref 0.0–0.1)
Eosinophils Relative: 6 % — ABNORMAL HIGH (ref 0–5)
Lymphocytes Relative: 28 % (ref 12–46)
Neutro Abs: 4.1 10*3/uL (ref 1.7–7.7)
Neutrophils Relative %: 59 % (ref 43–77)

## 2012-02-19 LAB — COMPREHENSIVE METABOLIC PANEL
AST: 18 U/L (ref 0–37)
Albumin: 3.7 g/dL (ref 3.5–5.2)
Chloride: 100 mEq/L (ref 96–112)
Creatinine, Ser: 0.57 mg/dL (ref 0.50–1.10)
Sodium: 133 mEq/L — ABNORMAL LOW (ref 135–145)
Total Bilirubin: 0.1 mg/dL — ABNORMAL LOW (ref 0.3–1.2)

## 2012-02-19 LAB — CBC
Platelets: 197 10*3/uL (ref 150–400)
RBC: 4 MIL/uL (ref 3.87–5.11)
RDW: 17.4 % — ABNORMAL HIGH (ref 11.5–15.5)
WBC: 7 10*3/uL (ref 4.0–10.5)

## 2012-02-19 LAB — POCT PREGNANCY, URINE: Preg Test, Ur: NEGATIVE

## 2012-02-19 MED ORDER — IOHEXOL 300 MG/ML  SOLN
20.0000 mL | INTRAMUSCULAR | Status: AC
  Administered 2012-02-19: 20 mL via ORAL

## 2012-02-19 MED ORDER — HYDROMORPHONE HCL PF 1 MG/ML IJ SOLN
1.0000 mg | Freq: Once | INTRAMUSCULAR | Status: AC
  Administered 2012-02-19: 1 mg via INTRAVENOUS
  Filled 2012-02-19: qty 1

## 2012-02-19 MED ORDER — IOHEXOL 300 MG/ML  SOLN
100.0000 mL | Freq: Once | INTRAMUSCULAR | Status: AC | PRN
  Administered 2012-02-19: 100 mL via INTRAVENOUS

## 2012-02-19 MED ORDER — SODIUM CHLORIDE 0.9 % IV BOLUS (SEPSIS)
1000.0000 mL | Freq: Once | INTRAVENOUS | Status: AC
  Administered 2012-02-19: 1000 mL via INTRAVENOUS

## 2012-02-19 MED ORDER — ONDANSETRON HCL 4 MG/2ML IJ SOLN
4.0000 mg | Freq: Once | INTRAMUSCULAR | Status: AC
  Administered 2012-02-19: 4 mg via INTRAVENOUS
  Filled 2012-02-19: qty 2

## 2012-02-19 NOTE — ED Notes (Signed)
Pt presents with 3 month h/o RLQ abdominal pain.  Pt reports heavy vaginal bleeding x 2 months (has subsided since Monday).  Pt seen at Dr. Erasmo Leventhal office, had ultrasound, blood work done which was negative, pt scheduled for MRI yesterday, reports she was unable to lie still for imaging.  Pt reports pain has been constant and does not radiate.  Pt denies any nausea, vomiting or diarrhea, denies any dysuria.

## 2012-02-19 NOTE — ED Notes (Signed)
Pt reports she was eating [employer] sandwich earlier and said tech walked in, alerted pt to remain NPO until after testing results.

## 2012-02-19 NOTE — ED Notes (Signed)
Ct aware that pt has finished her contrast

## 2012-02-19 NOTE — ED Provider Notes (Signed)
Medical screening examination/treatment/procedure(s) were performed by non-physician practitioner and as supervising physician I was immediately available for consultation/collaboration.   Nelia Shi, MD 02/19/12 [phone]

## 2012-02-19 NOTE — ED Provider Notes (Signed)
Assumed care in CDU.  Pt with RLQ abd pain, currently awaits abd/pelvic CT for further eval.  If neg, will have pt f/u with her PCP for further care.  She is a recovering addict, 8 yrs clean.    Pt was scheduled to have an abdominal MRI to evaluate for endometriosis due to the duration of her sxs.  Unfortunately she was unable to lie still for imaging due to pain.  Her labs today were unremarkable with no significant signs of infection.  UA shows no UTI.    3:08 PM Trixie Dredge, PA-C will continue pt care at end of shift.    Fayrene Helper, PA-C 02/19/12 [phone]

## 2012-02-19 NOTE — ED Notes (Signed)
Report given to Drinda Butts, RN in CDU. Pt currently drinking oral contrast for CT.

## 2012-02-19 NOTE — ED Notes (Signed)
Pt awakened to finish drinking her ct prep

## 2012-02-19 NOTE — H&P (Signed)
Ariana Jensen is an 46 y.o. female.    THIS IS NOT AN ADMISSION H&P, THIS IS AN ED CONSULT AND DISCHARGE NOTE  PCP: Dr. Durene Cal Chief Complaint: RLQ Pain HPI: 46 yo F on chronic methadone presents with continued/chronic RLQ pain.  This pain has been occuring for over 3 months, but acutely worsened yesterday and she was seen at [facility] clinic by her PCP who recommended she get pelvic MRI, which she did not do.  She presented to the ED today because the pain has continued.  It is throbbing in nature.  She does have a h/o ectopic pregnancy, now s/p left salpingectomy in 2010.  She has tried aleve with little relief. She was given Toradol IM in clinic yesterday, which helped the pain a little.  She has no other abdominal surgeries other than left salpingectomy.  She has not had nausea, vomiting, fevers, chills, dysuria, pain elsewhere in her abdomen other than RLQ, blood in her stools or urine, diarrhea, or constipation.  Last bowel movement was yesterday.  She is on chronic methadone without a stool softener. She denies sexual activity, vaginal discharge or bleeding.  She denies chest pain, shortness of breath, diaphoresis, headache, change in appetite. Has been tolerating eating and drinking well without problems.  Patient had a normal pelvic exam in clinic yesterday, without CMT or discharge.   While in the ED, pt has CMET and CBC, which were relatively unremarkable (nml WBC, LFTs, Cr, lytes); negative upreg; unremarkable UA witht he exception of slightly eleavted spec grav to 1.038 and small bili; and CT abd with contrast showing mid small bowel loops of slightly increased in caliber which may be due to focal ileus or low grade partial obstruction and NORMAL appendix.  Patient requests to go home from the ED due to home needs.   Past Medical History  Diagnosis Date  . Tuberculosis     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 age 72, now s/p 1-yr of multidrug treatment (dates unknown)  . Hypertension    . Hepatitis C antibody test positive   . ASCUS (atypical squamous cells of undetermined significance) on Pap smear   . Opioid dependence     Past Surgical History  Procedure Date  . Cesarean section   . Left salpingectomy March 2010    ectopic pregnancy  . Dilation and curettage of uterus   . I&d extremity 10/18/2011    Procedure: IRRIGATION AND DEBRIDEMENT EXTREMITY;  Surgeon: Sharma Covert, MD;  Location: [facility] OR;  Service: Orthopedics;  Laterality: Left;  . I&d extremity 10/21/2011    Procedure: IRRIGATION AND DEBRIDEMENT EXTREMITY;  Surgeon: Sharma Covert, MD;  Location: [facility] OR;  Service: Orthopedics;  Laterality: Left;    Family History  Problem Relation Age of Onset  . Lung cancer Mother   . Lung cancer Father    Social History:  reports that she has been smoking.  She has never used smokeless tobacco. She reports that she does not drink alcohol or use illicit drugs.  Allergies: No Known Allergies   (Not in a hospital admission)  Results for orders placed during the hospital encounter of 02/19/12 (from the past 48 hour(s))  CBC     Status: Abnormal   Collection Time   02/19/12  8:50 AM      Component Value Range Comment   WBC 7.0  4.0 - 10.5 (K/uL)    RBC 4.00  3.87 - 5.11 (MIL/uL)    Hemoglobin 11.7 (*) 12.0 - 15.0 (g/dL)  HCT 36.1  36.0 - 46.0 (%)    MCV 90.3  78.0 - 100.0 (fL)    MCH 29.3  26.0 - 34.0 (pg)    MCHC 32.4  30.0 - 36.0 (g/dL)    RDW 16.1 (*) 09.6 - 15.5 (%)    Platelets 197  150 - 400 (K/uL)   DIFFERENTIAL     Status: Abnormal   Collection Time   02/19/12  8:50 AM      Component Value Range Comment   Neutrophils Relative 59  43 - 77 (%)    Neutro Abs 4.1  1.7 - 7.7 (K/uL)    Lymphocytes Relative 28  12 - 46 (%)    Lymphs Abs 2.0  0.7 - 4.0 (K/uL)    Monocytes Relative 6  3 - 12 (%)    Monocytes Absolute 0.4  0.1 - 1.0 (K/uL)    Eosinophils Relative 6 (*) 0 - 5 (%)    Eosinophils Absolute 0.5  0.0 - 0.7 (K/uL)    Basophils Relative 0  0 - 1  (%)    Basophils Absolute 0.0  0.0 - 0.1 (K/uL)   COMPREHENSIVE METABOLIC PANEL     Status: Abnormal   Collection Time   02/19/12  9:01 AM      Component Value Range Comment   Sodium 133 (*) 135 - 145 (mEq/L)    Potassium 4.2  3.5 - 5.1 (mEq/L)    Chloride 100  96 - 112 (mEq/L)    CO2 24  19 - 32 (mEq/L)    Glucose, Bld 82  70 - 99 (mg/dL)    BUN 27 (*) 6 - 23 (mg/dL)    Creatinine, Ser 0.45  0.50 - 1.10 (mg/dL)    Calcium 9.0  8.4 - 10.5 (mg/dL)    Total Protein 6.9  6.0 - 8.3 (g/dL)    Albumin 3.7  3.5 - 5.2 (g/dL)    AST 18  0 - 37 (U/L)    ALT 13  0 - 35 (U/L)    Alkaline Phosphatase 47  39 - 117 (U/L)    Total Bilirubin 0.1 (*) 0.3 - 1.2 (mg/dL)    GFR calc non Af Amer >90  >90 (mL/min)    GFR calc Af Amer >90  >90 (mL/min)   LIPASE, BLOOD     Status: Normal   Collection Time   02/19/12  9:01 AM      Component Value Range Comment   Lipase 29  11 - 59 (U/L)   URINALYSIS, ROUTINE W REFLEX MICROSCOPIC     Status: Abnormal   Collection Time   02/19/12  9:57 AM      Component Value Range Comment   Color, Urine AMBER (*) YELLOW  BIOCHEMICALS MAY BE AFFECTED BY COLOR   APPearance HAZY (*) CLEAR     Specific Gravity, Urine 1.038 (*) 1.005 - 1.030     pH 5.5  5.0 - 8.0     Glucose, UA NEGATIVE  NEGATIVE (mg/dL)    Hgb urine dipstick NEGATIVE  NEGATIVE     Bilirubin Urine SMALL (*) NEGATIVE     Ketones, ur NEGATIVE  NEGATIVE (mg/dL)    Protein, ur NEGATIVE  NEGATIVE (mg/dL)    Urobilinogen, UA 1.0  0.0 - 1.0 (mg/dL)    Nitrite NEGATIVE  NEGATIVE     Leukocytes, UA NEGATIVE  NEGATIVE  MICROSCOPIC NOT DONE ON URINES WITH NEGATIVE PROTEIN, BLOOD, LEUKOCYTES, NITRITE, OR GLUCOSE <1000 mg/dL.  POCT PREGNANCY, URINE  Status: Normal   Collection Time   02/19/12 10:03 AM      Component Value Range Comment   Preg Test, Ur NEGATIVE  NEGATIVE     Ct Abdomen Pelvis W Contrast  02/19/2012  *RADIOLOGY REPORT*  Clinical Data: Abdominal pain  CT ABDOMEN AND PELVIS WITH CONTRAST   Technique:  Multidetector CT imaging of the abdomen and pelvis was performed following the standard protocol during bolus administration of intravenous contrast.  Contrast: OMNIPAQUE IOHEXOL 300 MG/ML  SOLN  Comparison: None  Findings: Lung bases appear clear.  No focal liver abnormalities.  The gallbladder appears normal. Mild intrahepatic biliary prominence.  The common bile duct is mildly increased in caliber measuring 5 mm, image 26.  The pancreas appears normal. The spleen appears within normal limits.  1.3 cm nodule within the right adrenal gland is identified and is indeterminate.  Left adrenal gland appears normal.  Normal appearance of the right kidney.  The left kidney is negative.  No obstructive uropathy.  The urinary bladder appears normal.  Fluid within the endometrial cavity noted.  There is a cyst within the left ovary which measures 1.6 cm, image number 62.  The stomach appears normal.  Mid  small bowel loops are mildly increased in caliber measuring up to 2.6 cm. The distal small bowel loops have a normal caliber.  The appendix is identified and appears normal.  The colon appears normal.  There is a periumbilical hernia which contains fat only.  No free fluid or fluid collections identified within the abdomen or pelvis.  Review of the visualized bony structures is significant for mild degenerative disc disease.  IMPRESSION:  1.  Mid small bowel loops are slightly increased in caliber which may be due to focal ileus or low grade partial obstruction. 2.  The appendix is visualized and appears normal.  Original Report Authenticated By: Rosealee Albee, M.D.    Review of Systems  Gastrointestinal: Positive for abdominal pain and constipation. Negative for nausea, vomiting and diarrhea.    Blood pressure 107/70, pulse 86, temperature 97.8 F (36.6 C), temperature source Oral, resp. rate 20, height 5\' 3"  (1.6 m), weight 120 lb (54.432 kg), last menstrual period 02/12/2012, SpO2  96.00%. Physical Exam  Constitutional: She appears well-developed and well-nourished. No distress.  Skin: She is not diaphoretic.     Assessment/Plan 46 yo F on chronic methadone presents with 1-2 days of worsening chronic RLQ pain.  1. RLQ pain: Initial ddx in clinic by PCP included ovarian torsion, ectopic pregnancy, appendicitis, diverticulitis, dysmenorrhea, constipation, adenomyosis, endometriosis, PID, UTI.  -appendicits, diverticulitis unlikely due to normal appearance on CT abd and lack of symptoms  -UTI unlikely without leukocytes on nitrites on UA -ectopic pregnancy unlikely with neg upreg -PID unlikely with normal pelvic exam, no CMT, and no vaginal d/c yesterday in clinic -CT showing possible partial small bowel obstruction, although pt with BM 1 day ago, but is on methadone so at high risk for constipation -tolerating po without nausea and vomiting, so unlikely to be fully obstructing bowel obstruction  Pt requesting d/c home, which appears to be safe from a medical standpoint based on all of the data that we have collected.  Patient will remain on mostly liquid diet over the weekend and will start MiraLAX as stool softener.  She will use 600mg  ibuprofen q6hr for pain control at home.   Red flags for return to ED discussed.  Patient will follow up at [facility] next week to ensure she  is improving.   Latajah Thuman 02/19/2012, 3:53 PM

## 2012-02-19 NOTE — ED Notes (Signed)
General Med in to see patient.  Pt still wishes to go home.  Instructions given to pt by MD

## 2012-02-19 NOTE — ED Notes (Signed)
Report received from Juli, RN. 

## 2012-02-19 NOTE — ED Notes (Signed)
Pt upset, states she has to leave because her pet is at the vet and she has to leave to go and pick it up.  She requests to speak with provider.  Provider informed.

## 2012-02-19 NOTE — ED Provider Notes (Signed)
3:28 PM Assumed care of patient at change of shift.  Pt is in CDU holding for CT abd/pelvis.  Pt was initially seen by Remi Haggard, NP, and Dr Radford Pax.  Sign out received from Fayrene Helper, PA-C.  Pt with 3 months of abdominal pain, worse over the past 2 days, localized to RLQ.  Pt has been scheduled for MRI to r/o endometriosis as cause as pt has [place] had abnormal vaginal bleeding.  Was unable to tolerate MRI because of increased pain.  Sent to ED by [facility].  Pt denies N/V/D.  Has been constipated, is on methadone without a stool softener.  Has been eating and drinking.  States abdomen did not become tender until today.  On exam, pt is A&Ox4, NAD, RRR, CTAB, abdomen nondistended, NABS, TTP diffusely, worst in RLQ, no guarding, no rebound.  CT shows focal ileus vs early partial small bowel obstruction.  Labs fairly unremarkable.  Pt is not distended, is not having N/V, is able to eat and drink - however, she is having worsening pain and is tender on exam. Per sign out, will call [facility] given possibility of early obstruction.    3:39 PM I spoke with Family Practice on call MD who will come to see the patient.    4:59 PM Patient seen and discharged by [facility].  Per CDU nurse, Lupita Leash, discharge instructions and possibly prescriptions given by PCP [facility]).    Results for orders placed during the hospital encounter of 02/19/12  CBC      Component Value Range   WBC 7.0  4.0 - 10.5 (K/uL)   RBC 4.00  3.87 - 5.11 (MIL/uL)   Hemoglobin 11.7 (*) 12.0 - 15.0 (g/dL)   HCT 45.4  09.8 - 11.9 (%)   MCV 90.3  78.0 - 100.0 (fL)   MCH 29.3  26.0 - 34.0 (pg)   MCHC 32.4  30.0 - 36.0 (g/dL)   RDW 14.7 (*) 82.9 - 15.5 (%)   Platelets 197  150 - 400 (K/uL)  DIFFERENTIAL      Component Value Range   Neutrophils Relative 59  43 - 77 (%)   Neutro Abs 4.1  1.7 - 7.7 (K/uL)   Lymphocytes Relative 28  12 - 46 (%)   Lymphs Abs 2.0  0.7 - 4.0 (K/uL)   Monocytes Relative 6  3 - 12  (%)   Monocytes Absolute 0.4  0.1 - 1.0 (K/uL)   Eosinophils Relative 6 (*) 0 - 5 (%)   Eosinophils Absolute 0.5  0.0 - 0.7 (K/uL)   Basophils Relative 0  0 - 1 (%)   Basophils Absolute 0.0  0.0 - 0.1 (K/uL)  URINALYSIS, ROUTINE W REFLEX MICROSCOPIC      Component Value Range   Color, Urine AMBER (*) YELLOW    APPearance HAZY (*) CLEAR    Specific Gravity, Urine 1.038 (*) 1.005 - 1.030    pH 5.5  5.0 - 8.0    Glucose, UA NEGATIVE  NEGATIVE (mg/dL)   Hgb urine dipstick NEGATIVE  NEGATIVE    Bilirubin Urine SMALL (*) NEGATIVE    Ketones, ur NEGATIVE  NEGATIVE (mg/dL)   Protein, ur NEGATIVE  NEGATIVE (mg/dL)   Urobilinogen, UA 1.0  0.0 - 1.0 (mg/dL)   Nitrite NEGATIVE  NEGATIVE    Leukocytes, UA NEGATIVE  NEGATIVE   COMPREHENSIVE METABOLIC PANEL      Component Value Range   Sodium 133 (*) 135 - 145 (mEq/L)  Potassium 4.2  3.5 - 5.1 (mEq/L)   Chloride 100  96 - 112 (mEq/L)   CO2 24  19 - 32 (mEq/L)   Glucose, Bld 82  70 - 99 (mg/dL)   BUN 27 (*) 6 - 23 (mg/dL)   Creatinine, Ser 1.61  0.50 - 1.10 (mg/dL)   Calcium 9.0  8.4 - 09.6 (mg/dL)   Total Protein 6.9  6.0 - 8.3 (g/dL)   Albumin 3.7  3.5 - 5.2 (g/dL)   AST 18  0 - 37 (U/L)   ALT 13  0 - 35 (U/L)   Alkaline Phosphatase 47  39 - 117 (U/L)   Total Bilirubin 0.1 (*) 0.3 - 1.2 (mg/dL)   GFR calc non Af Amer >90  >90 (mL/min)   GFR calc Af Amer >90  >90 (mL/min)  LIPASE, BLOOD      Component Value Range   Lipase 29  11 - 59 (U/L)  POCT PREGNANCY, URINE      Component Value Range   Preg Test, Ur NEGATIVE  NEGATIVE    US Transvaginal Non-ob  01/28/2012  *RADIOLOGY REPORT*  Clinical Data: Abnormal bleeding and pain  TRANSABDOMINAL AND TRANSVAGINAL ULTRASOUND OF PELVIS Technique:  Both transabdominal and transvaginal ultrasound examinations of the pelvis were performed. Transabdominal technique was performed for global imaging of the pelvis including uterus, ovaries, adnexal regions, and pelvic cul-de-sac.  Comparison: None.   It  was necessary to proceed with endovaginal exam following the transabdominal exam to visualize the endometrium and adnexa.  Findings:  The uterus is normal in size and echotexture, measuring 8.5 x 3.8 x 5.9 cm.  The endometrial stripe is thin and homogeneous, measuring 9 mm in width.  Both ovaries have a normal size and appearance.  The right ovary measures 2.5 x 1.7 x 2.0 cm, and the left ovary measures 3.7 x 2.4 x 3.0 cm.  There are no adnexal masses or free pelvic fluid.  IMPRESSION: Normal pelvic ultrasound.  Original Report Authenticated By: Brandon Melnick, M.D.   US Pelvis Complete  01/28/2012  *RADIOLOGY REPORT*  Clinical Data: Abnormal bleeding and pain  TRANSABDOMINAL AND TRANSVAGINAL ULTRASOUND OF PELVIS Technique:  Both transabdominal and transvaginal ultrasound examinations of the pelvis were performed. Transabdominal technique was performed for global imaging of the pelvis including uterus, ovaries, adnexal regions, and pelvic cul-de-sac.  Comparison: None.   It was necessary to proceed with endovaginal exam following the transabdominal exam to visualize the endometrium and adnexa.  Findings:  The uterus is normal in size and echotexture, measuring 8.5 x 3.8 x 5.9 cm.  The endometrial stripe is thin and homogeneous, measuring 9 mm in width.  Both ovaries have a normal size and appearance.  The right ovary measures 2.5 x 1.7 x 2.0 cm, and the left ovary measures 3.7 x 2.4 x 3.0 cm.  There are no adnexal masses or free pelvic fluid.  IMPRESSION: Normal pelvic ultrasound.  Original Report Authenticated By: Brandon Melnick, M.D.   Ct Abdomen Pelvis W Contrast  02/19/2012  *RADIOLOGY REPORT*  Clinical Data: Abdominal pain  CT ABDOMEN AND PELVIS WITH CONTRAST  Technique:  Multidetector CT imaging of the abdomen and pelvis was performed following the standard protocol during bolus administration of intravenous contrast.  Contrast: OMNIPAQUE IOHEXOL 300 MG/ML  SOLN  Comparison: None  Findings: Lung  bases appear clear.  No focal liver abnormalities.  The gallbladder appears normal. Mild intrahepatic biliary prominence.  The common bile duct is mildly increased in  caliber measuring 5 mm, image 26.  The pancreas appears normal. The spleen appears within normal limits.  1.3 cm nodule within the right adrenal gland is identified and is indeterminate.  Left adrenal gland appears normal.  Normal appearance of the right kidney.  The left kidney is negative.  No obstructive uropathy.  The urinary bladder appears normal.  Fluid within the endometrial cavity noted.  There is a cyst within the left ovary which measures 1.6 cm, image number 62.  The stomach appears normal.  Mid  small bowel loops are mildly increased in caliber measuring up to 2.6 cm. The distal small bowel loops have a normal caliber.  The appendix is identified and appears normal.  The colon appears normal.  There is a periumbilical hernia which contains fat only.  No free fluid or fluid collections identified within the abdomen or pelvis.  Review of the visualized bony structures is significant for mild degenerative disc disease.  IMPRESSION:  1.  Mid small bowel loops are slightly increased in caliber which may be due to focal ileus or low grade partial obstruction. 2.  The appendix is visualized and appears normal.  Original Report Authenticated By: Rosealee Albee, M.D.      Rise Patience, [place] 02/19/12 1700

## 2012-02-19 NOTE — ED Provider Notes (Signed)
History     CSN: [id number]  Arrival date & time 02/19/12  1478   First MD Initiated Contact with Patient 02/19/12 [phone]      Chief Complaint  Patient presents with  . Abdominal Pain    (Consider location/radiation/quality/duration/timing/severity/associated sxs/prior treatment) Patient is a 46 y.o. female presenting with abdominal pain. The history is provided by the patient. No language interpreter was used.  Abdominal Pain The primary symptoms of the illness include abdominal pain. The primary symptoms of the illness do not include fever, shortness of breath, nausea, vomiting, diarrhea, vaginal discharge or vaginal bleeding. The current episode started more than 2 days ago.  The patient states that she believes she is currently not pregnant. The patient has not had a change in bowel habit. Symptoms associated with the illness do not include chills, anorexia, constipation, urgency, hematuria, frequency or back pain. Significant associated medical issues include liver disease. Significant associated medical issues do not include GERD, inflammatory bowel disease, diabetes, diverticulitis or HIV.   a 46 year old female coming in today with right lower quadrant pain that is chronic. States that the pain has bothering her for 3 months. States that last Friday she started her period and the pain became worse. She would her primary care physician and had a pelvic exam and ultrasound that shows no cause of the pain. 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 Last bowel movement was yesterday and normal with no blood. Has no vaginal discharge today or bleeding. She did have her left ovary removed in the past. She still has her right ovary.Checked for  STDs with pelvic exam yesterday. For pain with no improvement. For MRI yesterday but was in so much pain she could not complete the test. She received a shot of 30 of Toradol IM yesterday for pain which did help a little. He is Dr. Durene Cal with  family medicine.  Past Medical History  Diagnosis Date  . Tuberculosis     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 age 35, now s/p 1-yr of multidrug treatment (dates unknown)  . Hypertension   . Hepatitis C antibody test positive   . ASCUS (atypical squamous cells of undetermined significance) on Pap smear   . Opioid dependence     Past Surgical History  Procedure Date  . Cesarean section   . Left salpingectomy March 2010    ectopic pregnancy  . Dilation and curettage of uterus   . I&d extremity 10/18/2011    Procedure: IRRIGATION AND DEBRIDEMENT EXTREMITY;  Surgeon: Sharma Covert, MD;  Location: [facility] OR;  Service: Orthopedics;  Laterality: Left;  . I&d extremity 10/21/2011    Procedure: IRRIGATION AND DEBRIDEMENT EXTREMITY;  Surgeon: Sharma Covert, MD;  Location: [facility] OR;  Service: Orthopedics;  Laterality: Left;    Family History  Problem Relation Age of Onset  . Lung cancer Mother   . Lung cancer Father     History  Substance Use Topics  . Smoking status: Current Everyday Smoker -- 1.0 packs/day for 20 years  . Smokeless tobacco: Never Used  . Alcohol Use: No    OB History    Grav Para Term Preterm Abortions TAB SAB Ect Mult Living                  Review of Systems  Constitutional: Negative.  Negative for fever and chills.  HENT: Negative.   Eyes: Negative.   Respiratory: Negative.  Negative for shortness of breath.   Cardiovascular: Negative.  Gastrointestinal: Positive for abdominal pain. Negative for nausea, vomiting, diarrhea, constipation and anorexia.  Genitourinary: Negative for urgency, frequency, hematuria, vaginal bleeding and vaginal discharge.  Musculoskeletal: Negative for back pain.  Neurological: Negative.   Psychiatric/Behavioral: Negative.   All other systems reviewed and are negative.    Allergies  Review of patient's allergies indicates no known allergies.  Home Medications   Current Outpatient Rx  Name Route Sig Dispense Refill  .  FERROUS SULFATE 325 (65 FE) MG PO TABS Oral Take 325 mg by mouth daily.    Marland Kitchen HYDROCHLOROTHIAZIDE 25 MG PO TABS Oral Take 1 tablet (25 mg total) by mouth daily. 30 tablet 5  . LISINOPRIL 10 MG PO TABS Oral Take 5 mg by mouth daily. Two tablets (10 mg ) daily    . METHADONE HCL 5 MG/5ML PO SOLN Oral Take 35 mg by mouth.     . OMEPRAZOLE 20 MG PO CPDR Oral Take 20 mg by mouth daily.      BP 161/97  Pulse 64  Temp(Src) 98.1 F (36.7 C) (Oral)  Resp 20  Ht 5\' 3"  (1.6 m)  Wt 120 lb (54.432 kg)  BMI 21.26 kg/m2  SpO2 100%  LMP 02/12/2012  Physical Exam  Nursing note and vitals reviewed. Constitutional: She is oriented to person, place, and time. She appears well-developed and well-nourished.  HENT:  Head: Normocephalic and atraumatic.  Eyes: Conjunctivae and EOM are normal. Pupils are equal, round, and reactive to light.  Neck: Normal range of motion. Neck supple.  Cardiovascular: Normal rate.   Pulmonary/Chest: Effort normal.  Abdominal: Soft. Bowel sounds are normal. She exhibits no distension. There is tenderness.  Musculoskeletal: Normal range of motion. She exhibits no edema and no tenderness.  Neurological: She is alert and oriented to person, place, and time. She has normal reflexes.  Skin: Skin is warm and dry.  Psychiatric: She has a normal mood and affect.    ED Course  Procedures (including critical care time)   Labs Reviewed  CBC  DIFFERENTIAL  BASIC METABOLIC PANEL  URINALYSIS, ROUTINE W REFLEX MICROSCOPIC   No results found.   No diagnosis found.    MDM  Report given to [facility]  on the CDU Patient will go to CDU to await her CT of the abdomen for RLQ pain. Spoke with family practice in and that was like to be contacted if anything is abnormal on the CT. Labs Unremarkable. Pelvic was done yesterday.   Labs Reviewed  CBC - Abnormal; Notable for the following:    Hemoglobin 11.7 (*)    RDW 17.4 (*)    All other components within normal limits    DIFFERENTIAL - Abnormal; Notable for the following:    Eosinophils Relative 6 (*)    All other components within normal limits  URINALYSIS, ROUTINE W REFLEX MICROSCOPIC - Abnormal; Notable for the following:    Color, Urine AMBER (*) BIOCHEMICALS MAY BE AFFECTED BY COLOR   APPearance HAZY (*)    Specific Gravity, Urine 1.038 (*)    Bilirubin Urine SMALL (*)    All other components within normal limits  COMPREHENSIVE METABOLIC PANEL - Abnormal; Notable for the following:    Sodium 133 (*)    BUN 27 (*)    Total Bilirubin 0.1 (*)    All other components within normal limits  LIPASE, BLOOD  POCT PREGNANCY, URINE         Remi Haggard, NP 02/19/12 1156

## 2012-02-19 NOTE — Discharge Instructions (Signed)
I'm so sorry you are still having problems with pain.  1. Please take 3 tablets of ibuprofen (600mg ) every 6 hours at home.  2. Please eat lighter foods, such as soups, crackers, and liquids for the next few days at home.  3. Please start taking a stool softener, such a MiraLAX every day to help with your bowels.  Plan to see Dr. Durene Cal in the clinic next week-- you can call to make an appointment that is convenient for you.  Come back to the ED over the weekend if you start having nausea and/or vomiting and if you're unable to keep down fluids.

## 2012-02-19 NOTE — ED Notes (Signed)
Patient reports right lower abdominal pain for the past 3-4 months. Usually associated with her menses but this time her menses has stopped and she states the pain continues and is constant now. Denies any associated n/v/d with the pain. States it does feel better if she puts pressure on the area.

## 2012-02-20 NOTE — H&P (Signed)
Seen and examined yesterday in the ER with Dr. Fara Boros.  I agree that she is safe to be discharged with outpatient follow up.  She has been tolerate PO well and has no nausea or abd distention.  I do not believe she has a clinically significant partial SBO.

## 2012-03-03 ENCOUNTER — Encounter: Payer: Self-pay | Admitting: Family Medicine

## 2012-03-03 ENCOUNTER — Ambulatory Visit (INDEPENDENT_AMBULATORY_CARE_PROVIDER_SITE_OTHER): Payer: Medicaid Other | Admitting: Family Medicine

## 2012-03-03 VITALS — BP 138/83 | HR 57 | Temp 97.7°F | Ht 63.0 in | Wt 125.5 lb

## 2012-03-03 DIAGNOSIS — N920 Excessive and frequent menstruation with regular cycle: Secondary | ICD-10-CM

## 2012-03-03 DIAGNOSIS — Z309 Encounter for contraceptive management, unspecified: Secondary | ICD-10-CM

## 2012-03-03 MED ORDER — LEVONORGESTREL 20 MCG/24HR IU IUD
INTRAUTERINE_SYSTEM | Freq: Once | INTRAUTERINE | Status: DC
  Administered 2012-03-03: 1 via INTRAUTERINE

## 2012-03-03 MED ORDER — LEVONORGESTREL 20 MCG/24HR IU IUD: INTRAUTERINE_SYSTEM | Freq: Once | INTRAUTERINE | Status: DC

## 2012-03-03 NOTE — Patient Instructions (Addendum)
We were unable to place the IUD today. Please read about Nexplanon to see if this is something you would like to try instead. If so, we can see if Medicaid will pay for it. You may take Tylenol or Motrin every 6 hours as needed for pelvic cramping. If you would like to speak to Dr. Durene Cal about the birth control pills vs. Nexplanon, schedule an appointment at your earliest convenience.

## 2012-03-03 NOTE — Addendum Note (Signed)
Addended by: Damita Lack on: 03/03/2012 11:26 AM   Modules accepted: Orders

## 2012-03-03 NOTE — Progress Notes (Signed)
Patient ID: Ariana Jensen, female   DOB: [birth year], 46 y.o.   MRN: [id number] IUD INSERTION: Patient given informed consent, signed copy in the chart..  Negative pregnancy confirmed.  Appropriate time out taken.   Sterile instruments and technique was used. Cervix brought into view with use of speculum and then cleansed three times with  betadine swabs.  A tenaculum was placed into the anterior lip of the cervix and a uterine sound was used to measure uterine size.   A Mirena IUD was placed into the endometrial cavity, deployed and before it could be secured, the patient rapidly scooted up the table, said she was having cramping, and the IUD and inserter came out of the endometrial canal. I attempted to assist Dr Tye Savoy in reinsertion but the patient was unable to tolerate it. The entire IUD / applicator apparatus was removed and the IUD was still threaded, so I informed the patient we could give her a rest, some ibuprofen and then attempt re-insertion. The speculum and tenaculum were still in place. She said she would be unable to tolerate any further procedure so all equipment was removed and the [facility] / IUD and applicator apparatus was discarded.  There were no  complicationsof bleeding. The patient experienced some pain during insertion and the procedure was not completed.    We discussed other options including provera orally cyclically per month and she said she wasn't NO pills.  It would be reasonable to consider her for nexplanon, endometrial ablation. My recommendation would be to refer her to GYN clinic for further discussion of options. She will f/u with Dr Durene Cal so they can make next step in treatment plan.Marland Kitchen

## 2012-03-03 NOTE — Progress Notes (Signed)
  Subjective:    Patient ID: Ariana Jensen, female    DOB: 03-26-1966, 46 y.o.   MRN: [id number]  HPI  Patient presents to clinic for IUD insertion for heavy, irregular bleeding.  Patient has had these symptoms for about 3 years, but it has become worse in the last 2-3 months.  Associated with moderate to severe RLQ pain that has been worked up per PCP.  CT pelvis and Pelvic US were unremarkable.  Cervical cultures were also negative.  Patient is mostly concerned about heavy bleeding because it causing her to take off work and affecting daily activities.  She does not want to try Provera OCP because she smokes and is concerned about increased risk of CVA.  She has had friends who have had strokes and is afraid to take birth control pills.  She understand that an IUD may cause irregular spotting/bleeding and may not improve RLQ pain, but she is willing to go through with it if it will decrease bleeding.  Denies any fever, chills, nausea/vomiting or pelvic pain at this time.   Review of Systems  Per HPI    Objective:   Physical Exam  Genitourinary: Vagina normal. There is no rash on the right labia. There is no rash on the left labia. Cervix exhibits no motion tenderness, no discharge and no friability.    Patient given informed consent, signed copy in the chart.  Negative pregnancy confirmed.  Appropriate time out taken.   Sterile instruments and technique was used. Cervix brought into view with use of speculum and then cleansed three times with  betadine swabs.  A tenaculum was placed into the anterior lip of the cervix and a uterine sound was used to measure uterine size.  A Mirena IUD was unsuccessful and NOT placed into the endometrial cavity due to pain.  Patient requested that we STOP the procedure, so we did.  Patient was given Motrin and information regarding Provera pills and Nexplanon.    Assessment & Plan:

## 2012-03-03 NOTE — Addendum Note (Signed)
Addended by: Damita Lack on: 03/03/2012 12:03 PM   Modules accepted: Orders

## 2012-03-08 ENCOUNTER — Ambulatory Visit: Payer: Medicaid Other | Admitting: Family Medicine

## 2012-03-08 ENCOUNTER — Telehealth: Payer: Self-pay | Admitting: Family Medicine

## 2012-03-08 ENCOUNTER — Other Ambulatory Visit (HOSPITAL_COMMUNITY): Payer: Medicaid Other

## 2012-03-08 NOTE — Telephone Encounter (Signed)
Patient states she is bleeding heavy going thru a box of 24 pads in 2 days. This started last week on 06/13 when IUD placement was attempted. She is in severe pain . Was due to have MRI today but she feels she can't do it . Appointment scheduled today with Dr. Lula Olszewski for work in.  Patient states she can get here in one hour. Advised to call to reschedule MRI if she feels  she has to. The same thing happened last time she was scheduled for MRI . She had to stop the MRI due to so much pain.

## 2012-03-08 NOTE — Telephone Encounter (Signed)
Patient is calling because she is scheduled to have an MRI today but she is having the same problem that she had the last time.  She is bleeding again and is in severe pain and she doesn't know what she needs to do about this.  Should she rescheduled the MRI, does she need to plan on being seen?

## 2012-03-22 ENCOUNTER — Other Ambulatory Visit: Payer: Self-pay | Admitting: Family Medicine

## 2012-03-22 ENCOUNTER — Ambulatory Visit (HOSPITAL_COMMUNITY)
Admission: RE | Admit: 2012-03-22 | Discharge: 2012-03-22 | Disposition: A | Discharge status: Home / Self Care | Payer: Self-pay | Source: Ambulatory Visit | Attending: Family Medicine | Admitting: Family Medicine

## 2012-03-22 ENCOUNTER — Encounter: Payer: Self-pay | Admitting: Family Medicine

## 2012-03-22 DIAGNOSIS — N920 Excessive and frequent menstruation with regular cycle: Secondary | ICD-10-CM

## 2012-03-22 DIAGNOSIS — R1031 Right lower quadrant pain: Secondary | ICD-10-CM | POA: Insufficient documentation

## 2012-04-06 ENCOUNTER — Ambulatory Visit: Payer: Self-pay | Admitting: Family Medicine

## 2012-04-18 ENCOUNTER — Ambulatory Visit: Payer: Self-pay | Admitting: Family Medicine

## 2012-05-15 ENCOUNTER — Encounter: Payer: Self-pay | Admitting: Family Medicine

## 2012-05-16 ENCOUNTER — Ambulatory Visit (INDEPENDENT_AMBULATORY_CARE_PROVIDER_SITE_OTHER): Payer: Self-pay | Admitting: Family Medicine

## 2012-05-16 ENCOUNTER — Encounter: Payer: Self-pay | Admitting: Family Medicine

## 2012-05-16 VITALS — BP 140/90 | HR 52 | Temp 98.6°F | Ht 63.0 in | Wt 127.0 lb

## 2012-05-16 DIAGNOSIS — Z720 Tobacco use: Secondary | ICD-10-CM

## 2012-05-16 DIAGNOSIS — N920 Excessive and frequent menstruation with regular cycle: Secondary | ICD-10-CM

## 2012-05-16 DIAGNOSIS — I10 Essential (primary) hypertension: Secondary | ICD-10-CM

## 2012-05-16 DIAGNOSIS — F17200 Nicotine dependence, unspecified, uncomplicated: Secondary | ICD-10-CM

## 2012-05-16 MED ORDER — NICOTINE 21 MG/24HR TD PT24: 1.0000 | MEDICATED_PATCH | TRANSDERMAL | Status: AC

## 2012-05-16 NOTE — Assessment & Plan Note (Signed)
Elevated initially but near goal on recheck. Will follow up with Tobacco abuse follow up visit.

## 2012-05-16 NOTE — Progress Notes (Signed)
Subjective:   1. Dysmennorhea-patient states menorrhagia has resolved and she is having periods that typically last 4 days and are much lighter than previous then will have some spotting for about 2 days after that. The pain she experiences has lessened in duration but ist still severe during first 2 days up to 10/10. Was not able to tolerate IUD insertion due to pain or able to stay in MRI for post contrast images due to pain.   2. Tobacco abuse-nausea with chantix and cannot tolerate. Rates 10/10 importance and 10/10 readiness to quit. Patient had husband die due to CHF related to smoking and mom and dad die of lung cancer within last 4 years so extremely important to her.   3. Hypertension--compliant with medications. BP on borderline for treatment on manual recheck.  BP Readings from Last 3 Encounters:  05/16/12 140/90  03/03/12 138/83  02/19/12 116/48  Denies any CP, HA, SOB, blurry vision, LE edema, transient weakness, orthopnea, PND.    ROS--See HPI  Past Medical History-smoking status noted: current smoker.  Reviewed problem list.  Medications- reviewed and updated Chief complaint-noted  Objective: BP 140/90  Pulse 52  Temp 98.6 F (37 C) (Oral)  Ht 5\' 3"  (1.6 m)  Wt 127 lb (57.607 kg)  BMI 22.50 kg/m2 Gen: NAD, resting comfortably in cahir  CV: RRR no mrg Lungs: CTAB, no wheeze, rales, rhonchi Ext: no edema  Assessment/Plan: See problem oriented charted

## 2012-05-16 NOTE — Patient Instructions (Signed)
Dear Ariana Jensen,   It was great to see you today. Thank you for coming to clinic. Please read below regarding the issues that we discussed.   1. For your menstrual pain and increased bleeding, [facility]  2. For your smoking, you set a quit date of September 1st. Put your first nicotine patch on the evening of Aug 31st then do not smoke on September first. Remember to call 1-800-quit-now for support and possible nicotine patches. Make a follow up appointment with me approximately 2 weeks from your quit date.  3. For your blood pressure, it is still slightly elevated. I would encourage regular exercise at least 3-4x per week. We will recheck at next visit. See healthy lifestyle handout below  Please follow up in clinic within 4 weeks . Please call earlier if you have any questions or concerns.   Sincerely,  Dr. Tana Conch  My 5 to Fitness!  5: fruits and vegetables per day (work on 9 per day if you are at 5) 4: exercise 4-5 times per week for at least 30 minutes (walking counts!) 3: meals per day (don't skip breakfast!) 2: habits to quit -smoking -excess alcohol use (men >2 beer/day; women >1beer/day) 1: sweet per day (2 cookies, 1 small cup of ice cream, 12 oz soda)  These are general tips for healthy living. Try to start with 1 or 2 habit TODAY and make it a part of your life for several months. You set a goal today to work on: Exercise!  Once you have 1 or 2 habits down for several months, try to begin working on your next healthy habit. With every single step you take, you will be leading a healthier lifestyle!

## 2012-05-16 NOTE — Assessment & Plan Note (Signed)
Quit date sept 1st. Will use patches 21mg  to start-see in 2 weeks and hope to titrate down by 7mg  every 2 weeks to off.

## 2012-05-16 NOTE — Assessment & Plan Note (Signed)
Concern adenomyosis with history of menorrhagia-now appears to have resolved but still with dysmenorrhea that is completely debilitating.   Discussed nexplanon, provera, etc. But patient resistant due to current smoking status.   Patient agrees for evaluation by GYN.

## 2012-05-19 ENCOUNTER — Ambulatory Visit (INDEPENDENT_AMBULATORY_CARE_PROVIDER_SITE_OTHER): Payer: Self-pay | Admitting: Gastroenterology

## 2012-05-19 DIAGNOSIS — B182 Chronic viral hepatitis C: Secondary | ICD-10-CM

## 2012-05-19 LAB — HEPATITIS B SURFACE ANTIGEN: Hepatitis B Surface Ag: NEGATIVE

## 2012-05-19 LAB — CBC WITH DIFFERENTIAL/PLATELET
Basophils Absolute: 0 10*3/uL (ref 0.0–0.1)
Eosinophils Relative: 3 % (ref 0–5)
HCT: 38 % (ref 36.0–46.0)
Lymphocytes Relative: 32 % (ref 12–46)
MCHC: 33.4 g/dL (ref 30.0–36.0)
MCV: 88.2 fL (ref 78.0–100.0)
Monocytes Absolute: 0.5 10*3/uL (ref 0.1–1.0)
RDW: 15.1 % (ref 11.5–15.5)
WBC: 7.8 10*3/uL (ref 4.0–10.5)

## 2012-05-19 LAB — PROTIME-INR
INR: 0.92 (ref <1.50)
Prothrombin Time: 12.7 seconds (ref 11.6–15.2)

## 2012-05-19 LAB — TSH: TSH: 1.513 u[IU]/mL (ref 0.350–4.500)

## 2012-05-19 LAB — FERRITIN: Ferritin: 28 ng/mL (ref 10–291)

## 2012-05-20 LAB — COMPLETE METABOLIC PANEL WITH GFR
AST: 18 U/L (ref 0–37)
BUN: 28 mg/dL — ABNORMAL HIGH (ref 6–23)
Calcium: 9.5 mg/dL (ref 8.4–10.5)
Chloride: 107 mEq/L (ref 96–112)
Creat: 0.8 mg/dL (ref 0.50–1.10)
GFR, Est African American: >89 mL/min
GFR, Est Non African American: >89 mL/min

## 2012-05-20 LAB — IRON: Iron: 127 ug/dL (ref 42–145)

## 2012-05-20 LAB — HEPATITIS A ANTIBODY, TOTAL: Hep A Total Ab: POSITIVE — AB

## 2012-05-25 LAB — HEPATITIS C GENOTYPE
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
## 2012-06-03 ENCOUNTER — Encounter: Payer: Self-pay | Admitting: Advanced Practice Midwife

## 2012-06-08 ENCOUNTER — Encounter: Payer: Self-pay | Admitting: Advanced Practice Midwife

## 2012-06-27 ENCOUNTER — Encounter: Payer: Self-pay | Admitting: Family Medicine

## 2012-06-28 ENCOUNTER — Ambulatory Visit: Payer: Medicaid Other

## 2012-06-29 ENCOUNTER — Ambulatory Visit (INDEPENDENT_AMBULATORY_CARE_PROVIDER_SITE_OTHER): Payer: Self-pay | Admitting: *Deleted

## 2012-06-29 VITALS — Temp 98.1°F

## 2012-06-29 DIAGNOSIS — Z23 Encounter for immunization: Secondary | ICD-10-CM

## 2013-03-08 ENCOUNTER — Other Ambulatory Visit: Payer: Self-pay | Admitting: *Deleted

## 2013-03-08 MED ORDER — PANTOPRAZOLE SODIUM 20 MG PO TBEC: 20.0000 mg | DELAYED_RELEASE_TABLET | Freq: Every day | ORAL | Status: DC

## 2013-04-28 ENCOUNTER — Encounter (HOSPITAL_COMMUNITY): Payer: Self-pay | Admitting: Emergency Medicine

## 2013-04-28 ENCOUNTER — Emergency Department (HOSPITAL_COMMUNITY)
Admission: EM | Admit: 2013-04-28 | Discharge: 2013-04-28 | Disposition: A | Discharge status: Home / Self Care | Payer: Self-pay | Attending: Emergency Medicine | Admitting: Emergency Medicine

## 2013-04-28 DIAGNOSIS — L0231 Cutaneous abscess of buttock: Secondary | ICD-10-CM | POA: Insufficient documentation

## 2013-04-28 DIAGNOSIS — Y9389 Activity, other specified: Secondary | ICD-10-CM | POA: Insufficient documentation

## 2013-04-28 DIAGNOSIS — Z79899 Other long term (current) drug therapy: Secondary | ICD-10-CM | POA: Insufficient documentation

## 2013-04-28 DIAGNOSIS — L03317 Cellulitis of buttock: Secondary | ICD-10-CM

## 2013-04-28 DIAGNOSIS — F17200 Nicotine dependence, unspecified, uncomplicated: Secondary | ICD-10-CM | POA: Insufficient documentation

## 2013-04-28 DIAGNOSIS — Y929 Unspecified place or not applicable: Secondary | ICD-10-CM | POA: Insufficient documentation

## 2013-04-28 DIAGNOSIS — I10 Essential (primary) hypertension: Secondary | ICD-10-CM | POA: Insufficient documentation

## 2013-04-28 DIAGNOSIS — L089 Local infection of the skin and subcutaneous tissue, unspecified: Secondary | ICD-10-CM | POA: Insufficient documentation

## 2013-04-28 DIAGNOSIS — Z8619 Personal history of other infectious and parasitic diseases: Secondary | ICD-10-CM | POA: Insufficient documentation

## 2013-04-28 DIAGNOSIS — T63391A Toxic effect of venom of other spider, accidental (unintentional), initial encounter: Secondary | ICD-10-CM | POA: Insufficient documentation

## 2013-04-28 DIAGNOSIS — Z8659 Personal history of other mental and behavioral disorders: Secondary | ICD-10-CM | POA: Insufficient documentation

## 2013-04-28 MED ORDER — TRAMADOL HCL 50 MG PO TABS: 50.0000 mg | ORAL_TABLET | Freq: Four times a day (QID) | ORAL | Status: DC | PRN

## 2013-04-28 MED ORDER — CEPHALEXIN 500 MG PO CAPS: 500.0000 mg | ORAL_CAPSULE | Freq: Four times a day (QID) | ORAL | Status: DC

## 2013-04-28 MED ORDER — TRAMADOL HCL 50 MG PO TABS
50.0000 mg | ORAL_TABLET | Freq: Once | ORAL | Status: AC
  Administered 2013-04-28: 50 mg via ORAL
  Filled 2013-04-28: qty 1

## 2013-04-28 MED ORDER — CEPHALEXIN 500 MG PO CAPS
500.0000 mg | ORAL_CAPSULE | Freq: Once | ORAL | Status: AC
  Administered 2013-04-28: 500 mg via ORAL
  Filled 2013-04-28: qty 1

## 2013-04-28 NOTE — ED Notes (Signed)
Pt states that she has been having spiders at her home and that she has a raised area on buttocks area for 2 days.

## 2013-04-28 NOTE — ED Provider Notes (Signed)
CSN: [id number]     Arrival date & time 04/28/13  1016 History     First MD Initiated Contact with Patient 04/28/13 1022     Chief Complaint  Patient presents with  . Cyst   (Consider location/radiation/quality/duration/timing/severity/associated sxs/prior Treatment) HPI Comments: Patient is a 47 year old female presenting to the emergency department for a spider bite that occurred 2 days ago on her left buttock. Patient states since then the area has become increasingly painful and with increased redness area patient states that her pain is constant sharp with no alleviating factors. Sitting and palpation aggravate pain. Rates her pain 8/10. Denies fevers or chills, CP, HA, SOB.    Past Medical History  Diagnosis Date  . Tuberculosis     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 age 65, now s/p 1-yr of multidrug treatment (dates unknown)  . Hypertension   . Hepatitis C antibody test positive   . ASCUS (atypical squamous cells of undetermined significance) on Pap smear   . Opioid dependence   . Trichomonas 04/17/2011    Diagnosed 04/02/11 during hospitalization, treated with Flagyl 2g, patient instructed to have partner treated (must follow-up)    Past Surgical History  Procedure Laterality Date  . Cesarean section    . Left salpingectomy  March 2010    ectopic pregnancy  . Dilation and curettage of uterus    . I&d extremity  10/18/2011    Procedure: IRRIGATION AND DEBRIDEMENT EXTREMITY;  Surgeon: Sharma Covert, MD;  Location: [facility] OR;  Service: Orthopedics;  Laterality: Left;  . I&d extremity  10/21/2011    Procedure: IRRIGATION AND DEBRIDEMENT EXTREMITY;  Surgeon: Sharma Covert, MD;  Location: [facility] OR;  Service: Orthopedics;  Laterality: Left;   Family History  Problem Relation Age of Onset  . Lung cancer Mother   . Lung cancer Father    History  Substance Use Topics  . Smoking status: Current Every Day Smoker -- 1.00 packs/day for 20 years  . Smokeless tobacco: Never Used  .  Alcohol Use: No   OB History   Grav Para Term Preterm Abortions TAB SAB Ect Mult Living                 Review of Systems  Constitutional: Negative for fever and chills.  Respiratory: Negative for shortness of breath.   Cardiovascular: Negative for chest pain.  Gastrointestinal: Negative for nausea and vomiting.  Skin: Positive for color change.  Neurological: Negative for headaches.    Allergies  Review of patient's allergies indicates no known allergies.  Home Medications   Current Outpatient Rx  Name  Route  Sig  Dispense  Refill  . esomeprazole (NEXIUM) 40 MG capsule   Oral   Take 40 mg by mouth daily before breakfast.         . hydrochlorothiazide (HYDRODIURIL) 25 MG tablet   Oral   Take 25 mg by mouth every morning.         Marland Kitchen lisinopril (PRINIVIL,ZESTRIL) 10 MG tablet   Oral   Take 5 mg by mouth every morning.         . methadone (DOLOPHINE) 5 MG/5ML solution   Oral   Take 65 mg by mouth every morning.          . naproxen sodium (ALEVE) 220 MG tablet   Oral   Take 880 mg by mouth every 8 (eight) hours as needed (For spider bite.).         Marland Kitchen cephALEXin (KEFLEX) 500 MG  capsule   Oral   Take 1 capsule (500 mg total) by mouth 4 (four) times daily.   40 capsule   0   . traMADol (ULTRAM) 50 MG tablet   Oral   Take 1 tablet (50 mg total) by mouth every 6 (six) hours as needed for pain.   15 tablet   0    BP 161/60  Pulse 88  Temp(Src) 99.1 F (37.3 C)  Resp 22  SpO2 99% Physical Exam  Constitutional: She is oriented to person, place, and time. She appears well-developed and well-nourished. No distress.  HENT:  Head: Normocephalic and atraumatic.  Eyes: Conjunctivae are normal.  Neck: Neck supple.  Cardiovascular: Normal rate, regular rhythm and normal heart sounds.   Pulmonary/Chest: Effort normal and breath sounds normal.  Neurological: She is alert and oriented to person, place, and time.  Skin: Skin is warm, dry and intact. No rash  noted. She is not diaphoretic. There is erythema.     6 cm x 6 cm warm erythematous indurated area on left buttock. No fluctuance. No drainage appreciated from site. Bite mark in center of erythema.   Psychiatric: She has a normal mood and affect.    ED Course   Procedures (including critical care time)  Labs Reviewed - No data to display No results found. 1. Cellulitis of buttock, left     MDM  Suspect uncomplicated cellulitis based on limited area of involvement, minimal pain, no systemic signs of illness (eg, fever, chills, dehydration, altered mental status, tachypnea, tachycardia, hypotension), no risk factors for serious illness (eg, extremes of age, general debility, immunocompromised status). PE reveals redness, swelling, mildly tender, warm to touch. Skin intact, No bleeding. No bullae. Non purulent. Non circumferential.  Borders are not elevated or sharply demarcated.  Pt was instructed to return to the ED if erythema increases or pain intensifies.  Will prescribed Bactrim to cover for MRSA, direct pt to apply warm compresses and to return to ED for I&D if pain should increase or abscess should develop. Patient was advised to follow up with PCP in 1-2 days. Patient is agreeable to plan. Patient is stable at time of discharge      Jeannetta Ellis, PA-C 04/28/13 1114

## 2013-05-01 NOTE — ED Provider Notes (Signed)
Medical screening examination/treatment/procedure(s) were performed by non-physician practitioner and as supervising physician I was immediately available for consultation/collaboration.    Claudean Kinds, MD 05/01/13 [phone]

## 2013-11-23 ENCOUNTER — Ambulatory Visit: Payer: No Typology Code available for payment source

## 2013-11-30 ENCOUNTER — Ambulatory Visit: Payer: No Typology Code available for payment source

## 2013-12-08 IMAGING — US US PELVIS COMPLETE
1 series · 14 of 25 positions shown · non-contrast
Comparison: None.

CLINICAL DATA: Abnormal bleeding and pain

TRANSABDOMINAL AND TRANSVAGINAL ULTRASOUND OF PELVIS
TECHNIQUE: Both transabdominal and transvaginal ultrasound
examinations of the pelvis were performed. Transabdominal technique
was performed for global imaging of the pelvis including uterus,
ovaries, adnexal regions, and pelvic cul-de-sac.

[Series 1: us pelvis complete · 14 of 52 slices shown]
[im 1/52]
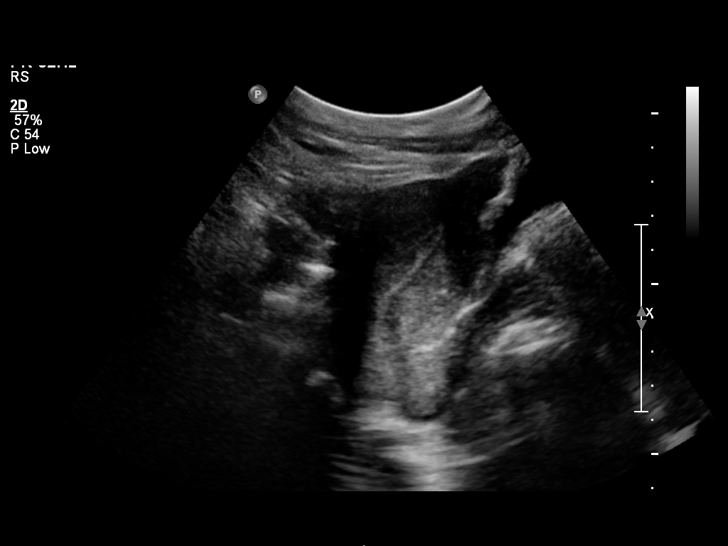
[im 5/52]
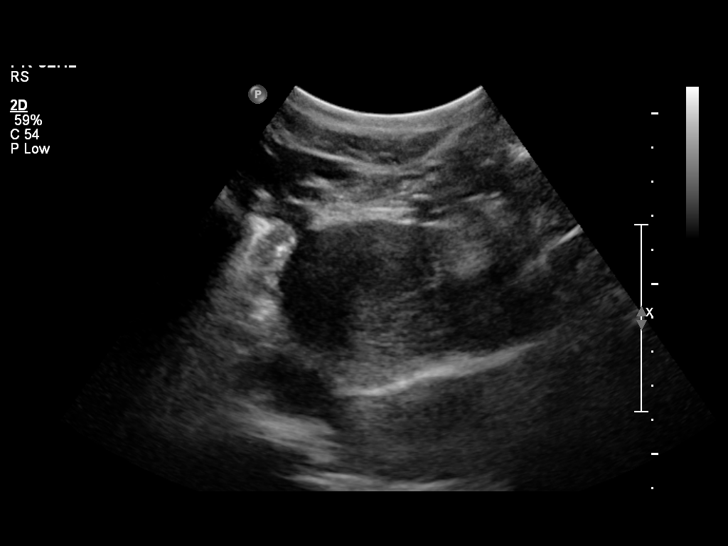
[im 9/52]
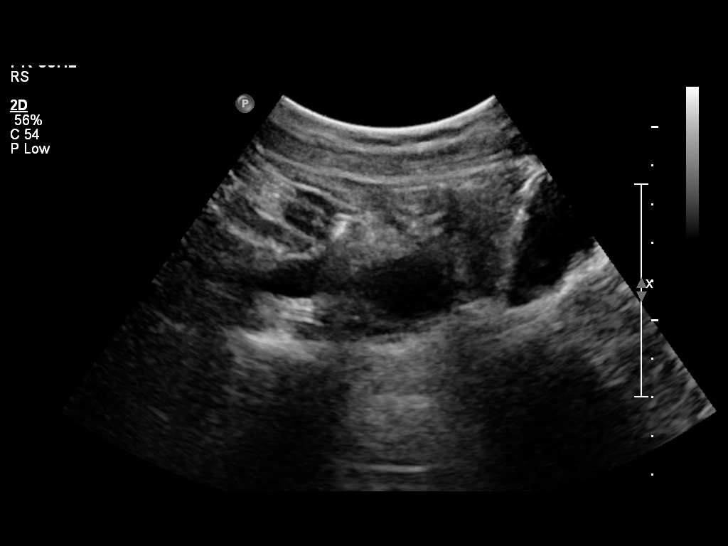
[im 13/52]
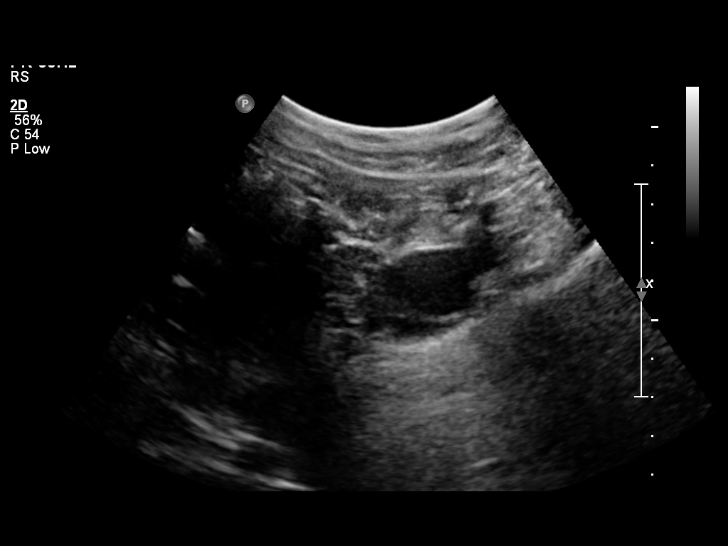
[im 18/52]
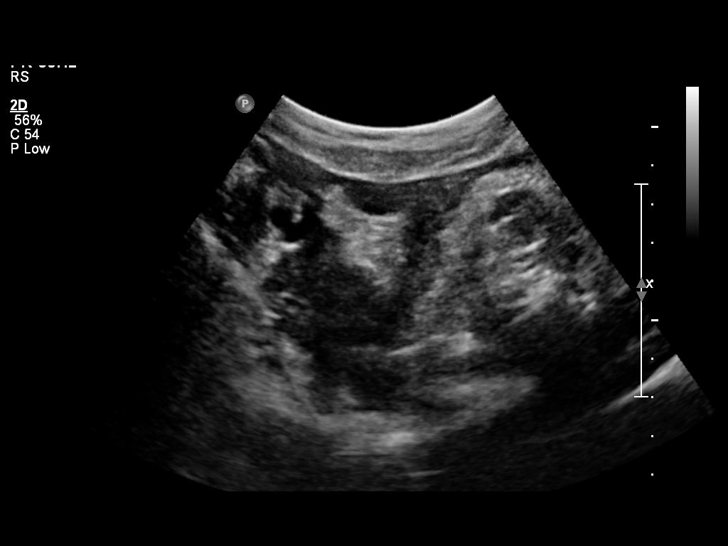
[im 20/52]
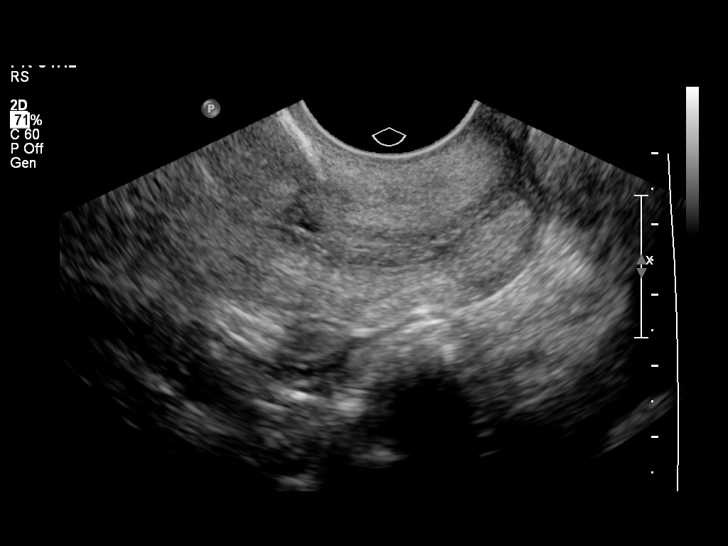
[im 24/52]
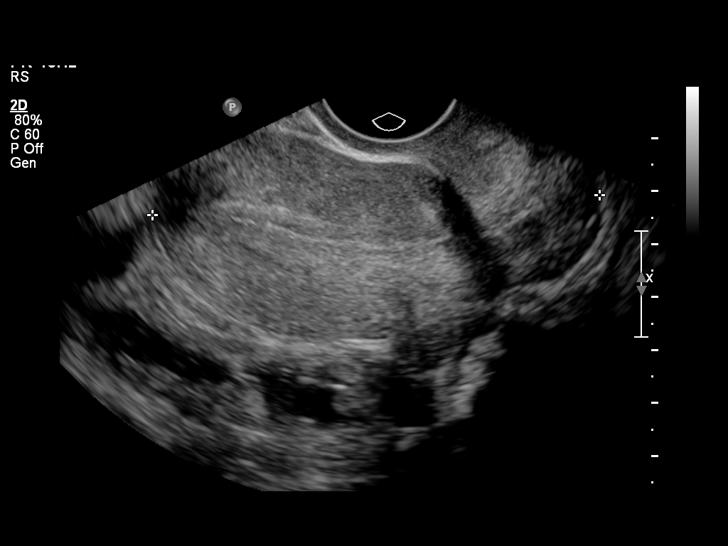
[im 28/52]
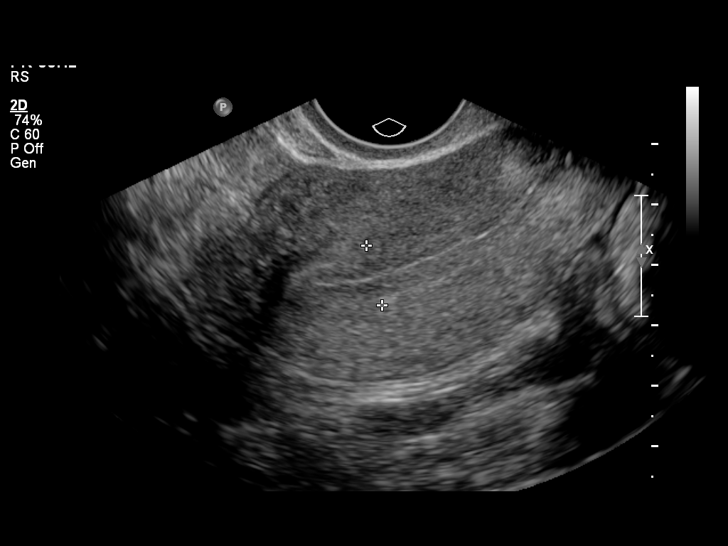
[im 32/52]
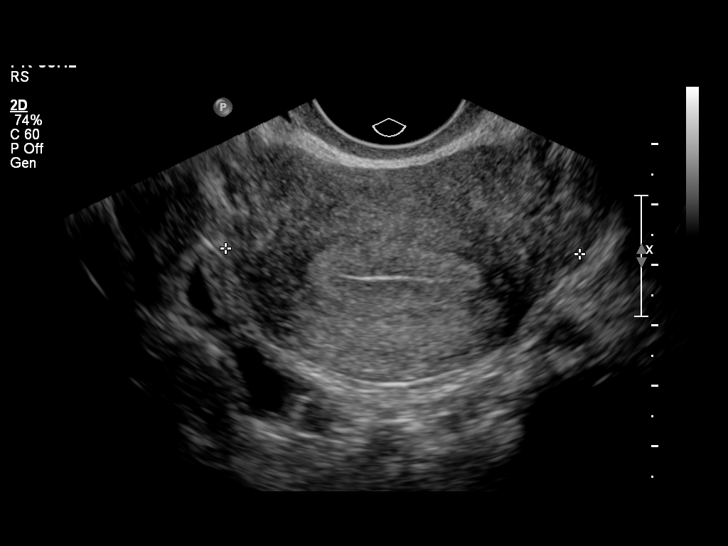
[im 35/52]
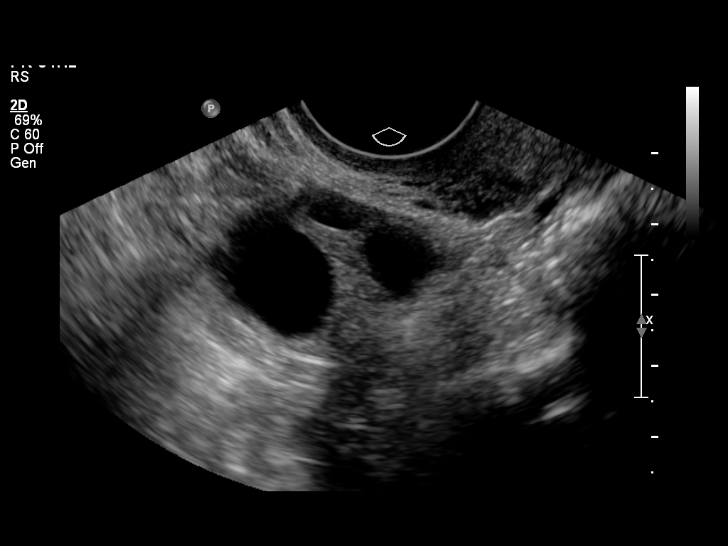
[im 39/52]
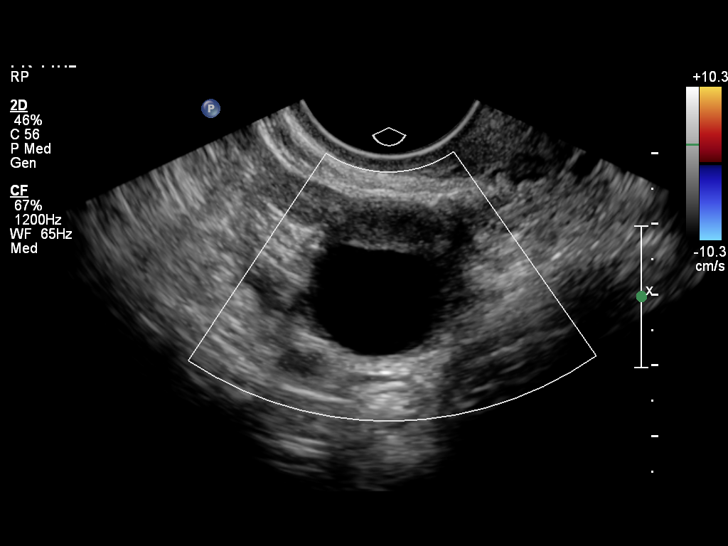
[im 43/52]
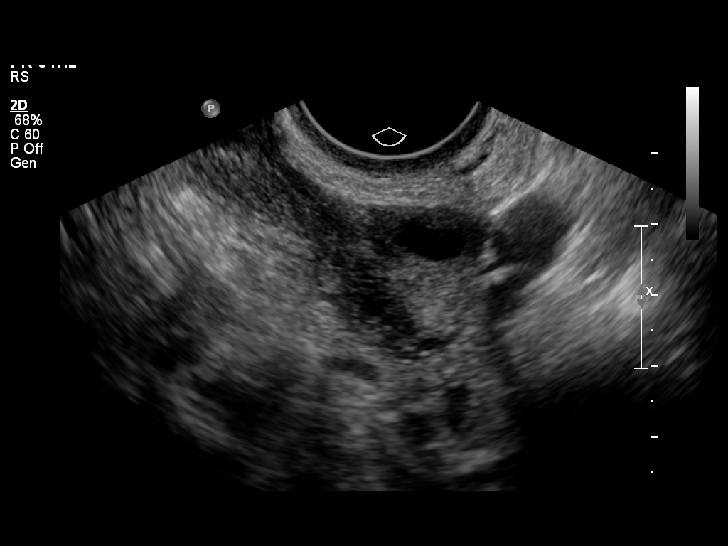
[im 47/52]
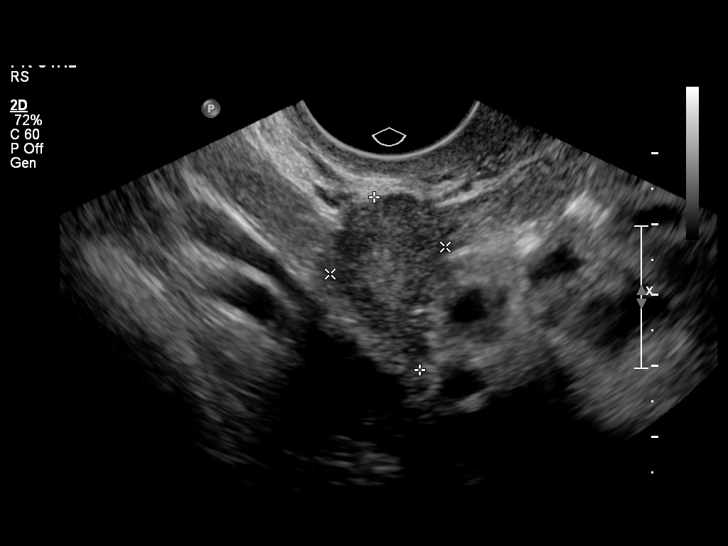
[im 52/52]
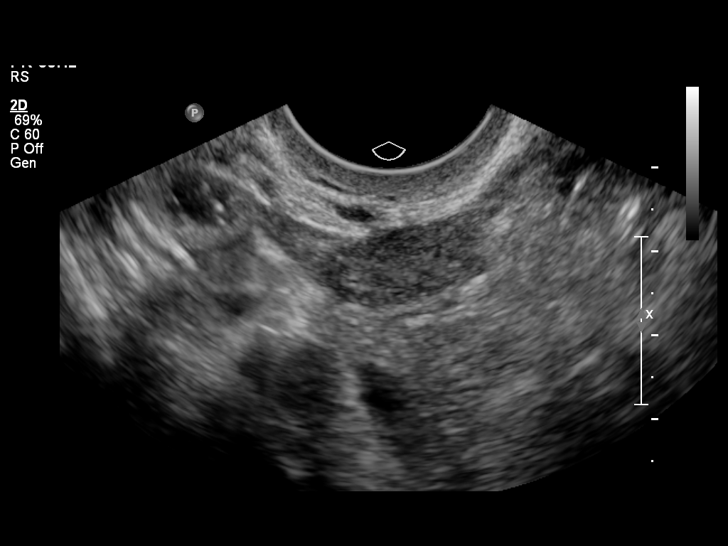

[14 of 25 positions shown; findings below may reference images not displayed]

It was necessary to proceed with endovaginal exam following the
transabdominal exam to visualize the endometrium and adnexa.
FINDINGS: The uterus is normal in size and echotexture, measuring 8.5 x 3.8 x
5.9 cm.  The endometrial stripe is thin and homogeneous, measuring
9 mm in width.

Both ovaries have a normal size and appearance.  The right ovary
measures 2.5 x 1.7 x 2.0 cm, and the left ovary measures 3.7 x
x 3.0 cm.  There are no adnexal masses or free pelvic fluid.
IMPRESSION: Normal pelvic ultrasound.

## 2013-12-21 ENCOUNTER — Encounter (HOSPITAL_COMMUNITY): Payer: Self-pay | Admitting: Emergency Medicine

## 2013-12-21 ENCOUNTER — Emergency Department (HOSPITAL_COMMUNITY): Payer: Medicaid Other

## 2013-12-21 ENCOUNTER — Emergency Department (HOSPITAL_COMMUNITY)
Admission: EM | Admit: 2013-12-21 | Discharge: 2013-12-21 | Disposition: A | Discharge status: Home / Self Care | Payer: Medicaid Other | Attending: Emergency Medicine | Admitting: Emergency Medicine

## 2013-12-21 DIAGNOSIS — I10 Essential (primary) hypertension: Secondary | ICD-10-CM | POA: Insufficient documentation

## 2013-12-21 DIAGNOSIS — Z3202 Encounter for pregnancy test, result negative: Secondary | ICD-10-CM | POA: Insufficient documentation

## 2013-12-21 DIAGNOSIS — Z79899 Other long term (current) drug therapy: Secondary | ICD-10-CM | POA: Insufficient documentation

## 2013-12-21 DIAGNOSIS — IMO0002 Reserved for concepts with insufficient information to code with codable children: Secondary | ICD-10-CM | POA: Insufficient documentation

## 2013-12-21 DIAGNOSIS — F17200 Nicotine dependence, unspecified, uncomplicated: Secondary | ICD-10-CM | POA: Insufficient documentation

## 2013-12-21 DIAGNOSIS — R1032 Left lower quadrant pain: Secondary | ICD-10-CM | POA: Insufficient documentation

## 2013-12-21 DIAGNOSIS — Z8611 Personal history of tuberculosis: Secondary | ICD-10-CM | POA: Insufficient documentation

## 2013-12-21 DIAGNOSIS — R1031 Right lower quadrant pain: Secondary | ICD-10-CM | POA: Insufficient documentation

## 2013-12-21 DIAGNOSIS — Z9889 Other specified postprocedural states: Secondary | ICD-10-CM | POA: Insufficient documentation

## 2013-12-21 DIAGNOSIS — K5900 Constipation, unspecified: Secondary | ICD-10-CM | POA: Insufficient documentation

## 2013-12-21 DIAGNOSIS — Z8619 Personal history of other infectious and parasitic diseases: Secondary | ICD-10-CM | POA: Insufficient documentation

## 2013-12-21 DIAGNOSIS — R1030 Lower abdominal pain, unspecified: Secondary | ICD-10-CM

## 2013-12-21 DIAGNOSIS — N898 Other specified noninflammatory disorders of vagina: Secondary | ICD-10-CM | POA: Insufficient documentation

## 2013-12-21 LAB — COMPREHENSIVE METABOLIC PANEL
ALT: 13 U/L (ref 0–35)
AST: 20 U/L (ref 0–37)
Albumin: 3.5 g/dL (ref 3.5–5.2)
Alkaline Phosphatase: 60 U/L (ref 39–117)
BUN: 21 mg/dL (ref 6–23)
CALCIUM: 8.8 mg/dL (ref 8.4–10.5)
CO2: 26 meq/L (ref 19–32)
Chloride: 100 mEq/L (ref 96–112)
Creatinine, Ser: 0.86 mg/dL (ref 0.50–1.10)
GFR, EST NON AFRICAN AMERICAN: 80 mL/min — AB (ref >90)
GLUCOSE: 96 mg/dL (ref 70–99)
Potassium: 3.7 mEq/L (ref 3.7–5.3)
SODIUM: 135 meq/L — AB (ref 137–147)
Total Bilirubin: <0.2 mg/dL — ABNORMAL LOW (ref 0.3–1.2)
Total Protein: 6.9 g/dL (ref 6.0–8.3)

## 2013-12-21 LAB — URINALYSIS, ROUTINE W REFLEX MICROSCOPIC
BILIRUBIN URINE: NEGATIVE
Glucose, UA: NEGATIVE mg/dL
Hgb urine dipstick: NEGATIVE
KETONES UR: NEGATIVE mg/dL
Leukocytes, UA: NEGATIVE
NITRITE: NEGATIVE
Protein, ur: NEGATIVE mg/dL
SPECIFIC GRAVITY, URINE: 1.025 (ref 1.005–1.030)
UROBILINOGEN UA: 1 mg/dL (ref 0.0–1.0)
pH: 6 (ref 5.0–8.0)

## 2013-12-21 LAB — PREGNANCY, URINE: Preg Test, Ur: NEGATIVE

## 2013-12-21 LAB — CBC WITH DIFFERENTIAL/PLATELET
Basophils Absolute: 0 10*3/uL (ref 0.0–0.1)
Basophils Relative: 1 % (ref 0–1)
EOS PCT: 2 % (ref 0–5)
Eosinophils Absolute: 0.2 10*3/uL (ref 0.0–0.7)
HEMATOCRIT: 35.6 % — AB (ref 36.0–46.0)
HEMOGLOBIN: 11.5 g/dL — AB (ref 12.0–15.0)
LYMPHS ABS: 2.4 10*3/uL (ref 0.7–4.0)
LYMPHS PCT: 28 % (ref 12–46)
MCH: 27.7 pg (ref 26.0–34.0)
MCHC: 32.3 g/dL (ref 30.0–36.0)
MCV: 85.8 fL (ref 78.0–100.0)
MONO ABS: 0.5 10*3/uL (ref 0.1–1.0)
MONOS PCT: 6 % (ref 3–12)
Neutro Abs: 5.3 10*3/uL (ref 1.7–7.7)
Neutrophils Relative %: 63 % (ref 43–77)
PLATELETS: 206 10*3/uL (ref 150–400)
RBC: 4.15 MIL/uL (ref 3.87–5.11)
RDW: 19.1 % — ABNORMAL HIGH (ref 11.5–15.5)
WBC: 8.5 10*3/uL (ref 4.0–10.5)

## 2013-12-21 LAB — WET PREP, GENITAL
Clue Cells Wet Prep HPF POC: NONE SEEN
Trich, Wet Prep: NONE SEEN
WBC WET PREP: NONE SEEN

## 2013-12-21 MED ORDER — HYDROMORPHONE HCL PF 1 MG/ML IJ SOLN
1.0000 mg | Freq: Once | INTRAMUSCULAR | Status: AC
  Administered 2013-12-21: 1 mg via INTRAMUSCULAR
  Filled 2013-12-21: qty 1

## 2013-12-21 MED ORDER — IOHEXOL 300 MG/ML  SOLN
100.0000 mL | Freq: Once | INTRAMUSCULAR | Status: AC | PRN
  Administered 2013-12-21: 100 mL via INTRAVENOUS

## 2013-12-21 MED ORDER — FLUCONAZOLE 150 MG PO TABS: 150.0000 mg | ORAL_TABLET | Freq: Once | ORAL | Status: DC

## 2013-12-21 MED ORDER — IOHEXOL 300 MG/ML  SOLN
50.0000 mL | Freq: Once | INTRAMUSCULAR | Status: AC | PRN
  Administered 2013-12-21: 50 mL via ORAL

## 2013-12-21 NOTE — ED Provider Notes (Signed)
Medical screening examination/treatment/procedure(s) were performed by non-physician practitioner and as supervising physician I was immediately available for consultation/collaboration.    Micheil Klaus R Mekenzie Modeste, MD 12/21/13 2344 

## 2013-12-21 NOTE — ED Provider Notes (Signed)
CSN: [id number]     Arrival date & time 12/21/13  1403 History   First MD Initiated Contact with Patient 12/21/13 1540     Chief Complaint  Patient presents with  . Abdominal Pain     (Consider location/radiation/quality/duration/timing/severity/associated sxs/prior Treatment) The history is provided by the patient and medical records. No language interpreter was used.    Ariana Jensen is a 48 y.o. female  with a hx of HTN, Hep C, currently at a methadone clinic for detox, TB (age 37, treated) presents to the Emergency Department complaining of gradual, intermittent, progressively worsening lower abd pain onset 1 month ago.  Pt reports that he pain became constant 1 week ago, worsening 2 days ago.  She reports the pain is located only in the lower abd, is rated at a 6/10, is non radiating and described as a heavy pressure.  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  Aleve makes it better and nothing makes it worse.  Pt reports normal PO intake.  Pt denies fever, chills, headache, neck pain, chest pain, SOB, N/V/D, weakness, dizziness, syncope, dysuria, hematuria.    PCP: Tana Conch   Past Medical History  Diagnosis Date  . Tuberculosis     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 age 27, now s/p 1-yr of multidrug treatment (dates unknown)  . Hypertension   . Hepatitis C antibody test positive   . ASCUS (atypical squamous cells of undetermined significance) on Pap smear   . Opioid dependence   . Trichomonas 04/17/2011    Diagnosed 04/02/11 during hospitalization, treated with Flagyl 2g, patient instructed to have partner treated (must follow-up)    Past Surgical History  Procedure Laterality Date  . Cesarean section    . Left salpingectomy  March 2010    ectopic pregnancy  . Dilation and curettage of uterus    . I&d extremity  10/18/2011    Procedure: IRRIGATION AND DEBRIDEMENT EXTREMITY;  Surgeon: Sharma Covert,  MD;  Location: [facility] OR;  Service: Orthopedics;  Laterality: Left;  . I&d extremity  10/21/2011    Procedure: IRRIGATION AND DEBRIDEMENT EXTREMITY;  Surgeon: Sharma Covert, MD;  Location: [facility] OR;  Service: Orthopedics;  Laterality: Left;   Family History  Problem Relation Age of Onset  . Lung cancer Mother   . Lung cancer Father    History  Substance Use Topics  . Smoking status: Current Every Day Smoker -- 1.00 packs/day for 20 years  . Smokeless tobacco: Never Used  . Alcohol Use: No   OB History   Grav Para Term Preterm Abortions TAB SAB Ect Mult Living                 Review of Systems  Constitutional: Negative for fever, diaphoresis, appetite change, fatigue and unexpected weight change.  HENT: Negative for mouth sores and trouble swallowing.   Respiratory: Negative for cough, chest tightness, shortness of breath, wheezing and stridor.   Cardiovascular: Negative for chest pain and palpitations.  Gastrointestinal: Positive for abdominal pain (pressure). Negative for nausea, vomiting, diarrhea, constipation, blood in stool, abdominal distention and rectal pain.  Genitourinary: Negative for dysuria, urgency, frequency, hematuria, flank pain and difficulty urinating.  Musculoskeletal: Negative for back pain, neck pain and neck stiffness.  Skin: Negative for rash.  Neurological: Negative for weakness.  Hematological: Negative for adenopathy.  Psychiatric/Behavioral: Negative for confusion.  All other systems reviewed and are negative.      Allergies  Review of patient's  allergies indicates no known allergies.  Home Medications   Current Outpatient Rx  Name  Route  Sig  Dispense  Refill  . esomeprazole (NEXIUM) 40 MG capsule   Oral   Take 40 mg by mouth daily before breakfast.         . lisinopril (PRINIVIL,ZESTRIL) 5 MG tablet   Oral   Take 5 mg by mouth daily.         . methadone (DOLOPHINE) 5 MG/5ML solution   Oral   Take 50 mg by mouth every morning.           . naproxen sodium (ANAPROX) 220 MG tablet   Oral   Take 440 mg by mouth every 4 (four) hours as needed (pain).          BP 183/92  Pulse 60  Temp(Src) 97.9 F (36.6 C) (Oral)  Resp 16  SpO2 94% Physical Exam  Nursing note and vitals reviewed. Constitutional: She is oriented to person, place, and time. She appears well-developed and well-nourished.  HENT:  Head: Normocephalic and atraumatic.  Mouth/Throat: Oropharynx is clear and moist.  Eyes: Conjunctivae are normal. No scleral icterus.  Neck: Normal range of motion. No thyromegaly present.  Cardiovascular: Normal rate, regular rhythm, normal heart sounds and intact distal pulses.   No murmur heard. Pulmonary/Chest: Effort normal and breath sounds normal.  Clear and equal breath sounds  Abdominal: Soft. Bowel sounds are normal. She exhibits no distension and no mass. There is tenderness in the right lower quadrant and left lower quadrant. There is no rebound, no guarding and no CVA tenderness. Hernia confirmed negative in the right inguinal area and confirmed negative in the left inguinal area.  Patient reports lower abdominal tenderness to palpation abdomen is soft without guarding, rebound, rigidity or peritoneal signs No CVA tenderness  Genitourinary: Uterus normal. There is no rash, tenderness, lesion or injury on the right labia. There is no rash, tenderness, lesion or injury on the left labia. Uterus is not deviated, not enlarged, not fixed and not tender. Cervix exhibits no motion tenderness, no discharge and no friability. Right adnexum displays no mass, no tenderness and no fullness. Left adnexum displays no mass, no tenderness and no fullness. No erythema, tenderness or bleeding around the vagina. No foreign body around the vagina. No signs of injury around the vagina. Vaginal discharge (minimal, white) found.  Lymphadenopathy:       Right: No inguinal adenopathy present.       Left: No inguinal adenopathy present.   Neurological: She is alert and oriented to person, place, and time. She exhibits normal muscle tone. Coordination normal.  Skin: Skin is warm and dry. No erythema.  Psychiatric: She has a normal mood and affect.    ED Course  Procedures (including critical care time) Labs Review Labs Reviewed  WET PREP, GENITAL - Abnormal; Notable for the following:    Yeast Wet Prep HPF POC FEW (*)    All other components within normal limits  CBC WITH DIFFERENTIAL - Abnormal; Notable for the following:    Hemoglobin 11.5 (*)    HCT 35.6 (*)    RDW 19.1 (*)    All other components within normal limits  COMPREHENSIVE METABOLIC PANEL - Abnormal; Notable for the following:    Sodium 135 (*)    Total Bilirubin <0.2 (*)    GFR calc non Af Amer 80 (*)    All other components within normal limits  URINALYSIS, ROUTINE W REFLEX MICROSCOPIC - Abnormal;  Notable for the following:    APPearance HAZY (*)    All other components within normal limits  GC/CHLAMYDIA PROBE AMP  PREGNANCY, URINE   Imaging Review Ct Abdomen Pelvis W Contrast  12/21/2013   CLINICAL DATA:  Lower abdominal pain.  EXAM: CT ABDOMEN AND PELVIS WITH CONTRAST  TECHNIQUE: Multidetector CT imaging of the abdomen and pelvis was performed using the standard protocol following bolus administration of intravenous contrast.  CONTRAST:  50mL OMNIPAQUE IOHEXOL 300 MG/ML SOLN, 100mL OMNIPAQUE IOHEXOL 300 MG/ML SOLN  COMPARISON:  CT scan dated 02/19/2012 and pelvic MRI dated 03/22/2012  FINDINGS: There is slight hepatomegaly, unchanged. Bile ducts are minimally prominent, unchanged. The gallbladder is not distended. There has been no significant change since the prior study.  Spleen, pancreas, adrenal glands, and kidneys are normal. The bowel is normal including the terminal ileum and appendix. Uterus is normal. There are 19 and 15 mm cysts on the left ovary. There is a 15 mm cyst on the right ovary. No free fluid or free air. Osseous structures are  essentially normal except for degenerative disc disease at L5-S1.  IMPRESSION: Small bilateral ovarian cysts.  Chronic mild hepatomegaly.   Electronically Signed   By: Geanie CooleyJim  Maxwell M.D.   On: 12/21/2013 18:40     EKG Interpretation None      MDM   Final diagnoses:  Lower abdominal pain    Ariana Jensen presents with c/o lower abd heaviness x1 month with increasing pain in the last several days.  Pt with mild tenderness to the lower abd without signs of a surgical abd.  Will obtain labs and pelvic exam.  Pelvic exam benign without adnexal pain or CMT.  Pt reports pain is increasing.  Will give pain control and obtain CT.  7:42 PM Pt CT with small bilateral ovarian cysts but otherwise normal CT abd.  I personally reviewed the imaging tests through PACS system.  I reviewed available ER/hospitalization records through the EMR.  Repeat abd exam remains benign and pain is under control.  Pt refusing further pain medication.    Wet prep with small amount of yeast; will treat with diflucan.    Patient is nontoxic, nonseptic appearing, in no apparent distress.  Patient's pain and other symptoms adequately managed in emergency department.  Labs, imaging and vitals reviewed.  Patient does not meet the SIRS or Sepsis criteria.  On repeat exam patient does not have a surgical abdomin and there are nor peritoneal signs.  No indication of appendicitis, bowel obstruction, bowel perforation, cholecystitis, diverticulitis, PID or ectopic pregnancy.  Patient discharged home with symptomatic treatment and given strict instructions for follow-up with their primary care physician within 3 days for further evaluation.  I have also discussed reasons to return immediately to the ER including intractable vomiting, fevers or worsening abd pain.  Patient expresses understanding and agrees with plan.  It has been determined that no acute conditions requiring further emergency intervention are present at this time.  The patient/guardian have been advised of the diagnosis and plan. We have discussed signs and symptoms that warrant return to the ED, such as changes or worsening in symptoms.   Vital signs are stable at discharge.   BP 183/92  Pulse 60  Temp(Src) 97.9 F (36.6 C) (Oral)  Resp 16  SpO2 94%  Patient/guardian has voiced understanding and agreed to follow-up with the PCP or specialist.            Dierdre ForthHannah Kingstin Heims, PA-C 12/21/13 2003

## 2013-12-21 NOTE — ED Notes (Signed)
Before discharge, pt removed her IV on her own. Site clean and dry at this time upon assessment.

## 2013-12-21 NOTE — ED Notes (Signed)
Pt c/o off and on pain in abd x 1 month, this past week has gotten really bad. Pt states has not had a period in 2 months. Denies n/v/d.

## 2013-12-21 NOTE — Discharge Instructions (Signed)
1. Medications: usual home medications 2. Treatment: rest, drink plenty of fluids,  3. Follow Up: Please followup with your primary doctor in 3 days for discussion of your diagnoses and further evaluation after today's visit;     Abdominal Pain, Women Abdominal (stomach, pelvic, or belly) pain can be caused by many things. It is important to tell your doctor:  The location of the pain.  Does it come and go or is it present all the time?  Are there things that start the pain (eating certain foods, exercise)?  Are there other symptoms associated with the pain (fever, nausea, vomiting, diarrhea)? All of this is helpful to know when trying to find the cause of the pain. CAUSES   Stomach: virus or bacteria infection, or ulcer.  Intestine: appendicitis (inflamed appendix), regional ileitis (Crohn's disease), ulcerative colitis (inflamed colon), irritable bowel syndrome, diverticulitis (inflamed diverticulum of the colon), or cancer of the stomach or intestine.  Gallbladder disease or stones in the gallbladder.  Kidney disease, kidney stones, or infection.  Pancreas infection or cancer.  Fibromyalgia (pain disorder).  Diseases of the female organs:  Uterus: fibroid (non-cancerous) tumors or infection.  Fallopian tubes: infection or tubal pregnancy.  Ovary: cysts or tumors.  Pelvic adhesions (scar tissue).  Endometriosis (uterus lining tissue growing in the pelvis and on the pelvic organs).  Pelvic congestion syndrome (female organs filling up with blood just before the menstrual period).  Pain with the menstrual period.  Pain with ovulation (producing an egg).  Pain with an IUD (intrauterine device, birth control) in the uterus.  Cancer of the female organs.  Functional pain (pain not caused by a disease, may improve without treatment).  Psychological pain.  Depression. DIAGNOSIS  Your doctor will decide the seriousness of your pain by doing an  examination.  Blood tests.  X-rays.  Ultrasound.  CT scan (computed tomography, special type of X-ray).  MRI (magnetic resonance imaging).  Cultures, for infection.  Barium enema (dye inserted in the large intestine, to better view it with X-rays).  Colonoscopy (looking in intestine with a lighted tube).  Laparoscopy (minor surgery, looking in abdomen with a lighted tube).  Major abdominal exploratory surgery (looking in abdomen with a large incision). TREATMENT  The treatment will depend on the cause of the pain.   Many cases can be observed and treated at home.  Over-the-counter medicines recommended by your caregiver.  Prescription medicine.  Antibiotics, for infection.  Birth control pills, for painful periods or for ovulation pain.  Hormone treatment, for endometriosis.  Nerve blocking injections.  Physical therapy.  Antidepressants.  Counseling with a psychologist or psychiatrist.  Minor or major surgery. HOME CARE INSTRUCTIONS   Do not take laxatives, unless directed by your caregiver.  Take over-the-counter pain medicine only if ordered by your caregiver. Do not take aspirin because it can cause an upset stomach or bleeding.  Try a clear liquid diet (broth or water) as ordered by your caregiver. Slowly move to a bland diet, as tolerated, if the pain is related to the stomach or intestine.  Have a thermometer and take your temperature several times a day, and record it.  Bed rest and sleep, if it helps the pain.  Avoid sexual intercourse, if it causes pain.  Avoid stressful situations.  Keep your follow-up appointments and tests, as your caregiver orders.  If the pain does not go away with medicine or surgery, you may try:  Acupuncture.  Relaxation exercises (yoga, meditation).  Group therapy.  Counseling.  SEEK MEDICAL CARE IF:   You notice certain foods cause stomach pain.  Your home care treatment is not helping your pain.  You  need stronger pain medicine.  You want your IUD removed.  You feel faint or lightheaded.  You develop nausea and vomiting.  You develop a rash.  You are having side effects or an allergy to your medicine. SEEK IMMEDIATE MEDICAL CARE IF:   Your pain does not go away or gets worse.  You have a fever.  Your pain is felt only in portions of the abdomen. The right side could possibly be appendicitis. The left lower portion of the abdomen could be colitis or diverticulitis.  You are passing blood in your stools (bright red or black tarry stools, with or without vomiting).  You have blood in your urine.  You develop chills, with or without a fever.  You pass out. MAKE SURE YOU:   Understand these instructions.  Will watch your condition.  Will get help right away if you are not doing well or get worse. Document Released: 07/05/2007 Document Revised: 11/30/2011 Document Reviewed: 07/25/2009 [facility] Patient Information 2014 [employer] [place].

## 2013-12-21 NOTE — ED Notes (Addendum)
Pt presents with severe lower abdominal pain; no period in two months. Pt says abdominal pain started about one month ago, became worse last week and over the last 2 days.

## 2013-12-22 LAB — GC/CHLAMYDIA PROBE AMP
CT Probe RNA: NEGATIVE
GC PROBE AMP APTIMA: NEGATIVE

## 2014-04-01 ENCOUNTER — Emergency Department (HOSPITAL_COMMUNITY)
Admission: EM | Admit: 2014-04-01 | Discharge: 2014-04-01 | Disposition: A | Discharge status: Home / Self Care | Payer: No Typology Code available for payment source | Attending: Emergency Medicine | Admitting: Emergency Medicine

## 2014-04-01 ENCOUNTER — Encounter (HOSPITAL_COMMUNITY): Payer: Self-pay | Admitting: Emergency Medicine

## 2014-04-01 DIAGNOSIS — R109 Unspecified abdominal pain: Secondary | ICD-10-CM | POA: Insufficient documentation

## 2014-04-01 LAB — COMPREHENSIVE METABOLIC PANEL
ALBUMIN: 3.3 g/dL — AB (ref 3.5–5.2)
ALK PHOS: 52 U/L (ref 39–117)
ALT: 18 U/L (ref 0–35)
AST: 23 U/L (ref 0–37)
Anion gap: 13 (ref 5–15)
BUN: 21 mg/dL (ref 6–23)
CHLORIDE: 102 meq/L (ref 96–112)
CO2: 25 mEq/L (ref 19–32)
Calcium: 8.6 mg/dL (ref 8.4–10.5)
Creatinine, Ser: 0.75 mg/dL (ref 0.50–1.10)
GFR calc non Af Amer: >90 mL/min (ref >90)
GLUCOSE: 86 mg/dL (ref 70–99)
POTASSIUM: 4.2 meq/L (ref 3.7–5.3)
SODIUM: 140 meq/L (ref 137–147)
Total Protein: 7.1 g/dL (ref 6.0–8.3)

## 2014-04-01 LAB — CBC WITH DIFFERENTIAL/PLATELET
Basophils Absolute: 0 10*3/uL (ref 0.0–0.1)
Basophils Relative: 1 % (ref 0–1)
EOS ABS: 0.3 10*3/uL (ref 0.0–0.7)
Eosinophils Relative: 3 % (ref 0–5)
HCT: 33.2 % — ABNORMAL LOW (ref 36.0–46.0)
HEMOGLOBIN: 10.3 g/dL — AB (ref 12.0–15.0)
LYMPHS ABS: 2.1 10*3/uL (ref 0.7–4.0)
LYMPHS PCT: 27 % (ref 12–46)
MCH: 27.3 pg (ref 26.0–34.0)
MCHC: 31 g/dL (ref 30.0–36.0)
MCV: 88.1 fL (ref 78.0–100.0)
MONOS PCT: 10 % (ref 3–12)
Monocytes Absolute: 0.7 10*3/uL (ref 0.1–1.0)
NEUTROS ABS: 4.6 10*3/uL (ref 1.7–7.7)
Neutrophils Relative %: 59 % (ref 43–77)
PLATELETS: 212 10*3/uL (ref 150–400)
RBC: 3.77 MIL/uL — ABNORMAL LOW (ref 3.87–5.11)
RDW: 16.5 % — ABNORMAL HIGH (ref 11.5–15.5)
WBC: 7.8 10*3/uL (ref 4.0–10.5)

## 2014-04-01 NOTE — ED Notes (Signed)
Called at triage, patient has still not returned

## 2014-04-01 NOTE — ED Notes (Signed)
Pt c/o lower abdominal pain x 2 weeks. Pt denies N/V. Pt reports pain was intermittent becoming constant for past 2 days. Pt last BM yesterday and normal. Pt reports cream/white vaginal discharge, no odor.

## 2014-04-01 NOTE — ED Notes (Signed)
Pt has not returned, unable to find.

## 2014-05-21 ENCOUNTER — Ambulatory Visit: Payer: Self-pay

## 2014-06-25 ENCOUNTER — Ambulatory Visit (INDEPENDENT_AMBULATORY_CARE_PROVIDER_SITE_OTHER): Payer: No Typology Code available for payment source | Admitting: Family Medicine

## 2014-06-25 ENCOUNTER — Encounter: Payer: Self-pay | Admitting: Family Medicine

## 2014-06-25 VITALS — BP 172/91 | HR 57 | Temp 98.6°F | Ht 63.0 in | Wt 139.0 lb

## 2014-06-25 DIAGNOSIS — K219 Gastro-esophageal reflux disease without esophagitis: Secondary | ICD-10-CM

## 2014-06-25 DIAGNOSIS — Z23 Encounter for immunization: Secondary | ICD-10-CM

## 2014-06-25 DIAGNOSIS — Z720 Tobacco use: Secondary | ICD-10-CM

## 2014-06-25 DIAGNOSIS — I10 Essential (primary) hypertension: Secondary | ICD-10-CM

## 2014-06-25 MED ORDER — ESOMEPRAZOLE MAGNESIUM 40 MG PO CPDR: 40.0000 mg | DELAYED_RELEASE_CAPSULE | Freq: Every day | ORAL | Status: DC

## 2014-06-25 MED ORDER — LISINOPRIL 5 MG PO TABS: 5.0000 mg | ORAL_TABLET | Freq: Every day | ORAL | Status: DC

## 2014-06-25 MED ORDER — HYDROCHLOROTHIAZIDE 25 MG PO TABS: 25.0000 mg | ORAL_TABLET | Freq: Every day | ORAL | Status: DC

## 2014-06-25 NOTE — Progress Notes (Signed)
Patient ID: Ariana Jensen, female   DOB: 04-06-1966, 48 y.o.   MRN: [id number]    Ariana Jensen is a 48 y.o. female [facility] with a chief complaint of blood pressure follow up. Her concerns today include:  HPI:  Hypertension BP Readings from Last 3 Encounters:  06/25/14 172/91  04/01/14 168/103  12/21/13 183/92   Home BP monitoring-No Compliant with medications-no ROS-Denies any CP, HA, blurry vision, LE edema, transient weakness, orthopnea, PND. SOB at baseline. Some intermittent dizziness.  Longstanding, several year history. Patient reports previously being moderately well controlled on lisinopril and HCTZ until 4 months ago, however stopped due to financial reasons and is now worsening. Previously tolerated those medications well without side effects.  GERD -Symptoms well controlled with nexium.  Smoking Cessation. Interested in stopping today. Has tried chantix in the past, but said the side effects were intolerable. Interested in trying other agents for cessation.  ROS: As per HPI, otherwise all systems reviewed and are negative.  Past Medical History - Reviewed and updated Patient Active Problem List   Diagnosis Date Noted  . RLQ abdominal pain 02/18/2012  . GERD (gastroesophageal reflux disease) 01/28/2012  . Menorrhagia 01/25/2012  . Weakness generalized 12/05/2011  . Tobacco abuse 11/06/2011  . HTN (hypertension) 10/22/2011  . Abscess and cellulitis 10/22/2011  . Hepatitis c, chronic 10/22/2011  . History of TB (tuberculosis) 10/22/2011  . Opioid dependence   . Polysubstance abuse 04/17/2011    Medications- reviewed and updated Current Outpatient Prescriptions  Medication Sig Dispense Refill  . esomeprazole (NEXIUM) 40 MG capsule Take 1 capsule (40 mg total) by mouth daily before breakfast.  90 capsule  3  . methadone (DOLOPHINE) 5 MG/5ML solution Take 50 mg by mouth every morning.       . hydrochlorothiazide (HYDRODIURIL) 25 MG  tablet Take 1 tablet (25 mg total) by mouth daily.  90 tablet  0  . lisinopril (PRINIVIL,ZESTRIL) 5 MG tablet Take 1 tablet (5 mg total) by mouth daily.  90 tablet  0   Current Facility-Administered Medications  Medication Dose Route Frequency Provider Last Rate Last Dose  . hepatitis A virus (PF) vaccine (HAVRIX (PF)) 1440 EL U/ML injection 1,440 Units  1 mL Intramuscular Once Shelva MajesticStephen O Hunter, MD        Objective: Physical Exam: BP 172/91  Pulse 57  Temp(Src) 98.6 F (37 C) (Oral)  Ht 5\' 3"  (1.6 m)  Wt 139 lb (63.05 kg)  BMI 24.63 kg/m2  LMP 06/18/2014  Gen: NAD, resting comfortably CV: RRR with no murmurs appreciated Lungs: NWOB, CTAB with no crackles, wheezes, or rhonchi Abdomen: Normal bowel sounds present. Soft, Nontender, Nondistended. Ext: no edema Skin: warm, dry Neuro: grossly normal, moves all extremities  No results found for this or any previous visit (from the past 72 hour(s)).  A/P: See problem list  HTN (hypertension) Will restart lisinopril 5mg  daily today. Will have blood pressure recheck at smoking cessation visit in next couple weeks. Will restart HCTZ 25mg  daily if SBP>150 or DBP>90 at that visit. Will follow up in 3 months.  GERD (gastroesophageal reflux disease) Refilled nexium today.  Tobacco abuse Ref[facility]    No orders of the defined types were placed in this encounter.    Meds ordered this encounter  Medications  . lisinopril (PRINIVIL,ZESTRIL) 5 MG tablet    Sig: Take 1 tablet (5 mg total) by mouth daily.    Dispense:  90 tablet    Refill:  0  . hydrochlorothiazide (HYDRODIURIL) 25 MG tablet    Sig: Take 1 tablet (25 mg total) by mouth daily.    Dispense:  90 tablet    Refill:  0  . esomeprazole (NEXIUM) 40 MG capsule    Sig: Take 1 capsule (40 mg total) by mouth daily before breakfast.    Dispense:  90 capsule    Refill:  3     Domenic Schoenberger M. [name], MD [facility]  Family Medicine Resident PGY-1 06/25/2014 2:58 PM

## 2014-06-25 NOTE — Patient Instructions (Signed)
Thank you for coming to the clinic today. It was nice seeing you.  For your blood pressure, we have restarted lisinopril 5mg  daily. Take this until your smoking cessation appointment. If your top number is still more than 150 or your bottom number is more than 90, restart the HCTZ. If you experience any significant chest pain, shortness of breath, or weakness, please call 911.  For your smoking, you can [facility] an [facility] to discuss further options..  See you again in 3 months.

## 2014-06-25 NOTE — Assessment & Plan Note (Signed)
Referred patient to schedule [facility]

## 2014-06-25 NOTE — Assessment & Plan Note (Signed)
Will restart lisinopril 5mg  daily today. Will have blood pressure recheck at smoking cessation visit in next couple weeks. Will restart HCTZ 25mg  daily if SBP>150 or DBP>90 at that visit. Will follow up in 3 months.

## 2014-06-25 NOTE — Assessment & Plan Note (Signed)
Refilled nexium today.  

## 2014-09-23 ENCOUNTER — Encounter (HOSPITAL_COMMUNITY): Payer: Self-pay | Admitting: *Deleted

## 2014-09-23 ENCOUNTER — Emergency Department (HOSPITAL_COMMUNITY)
Admission: EM | Admit: 2014-09-23 | Discharge: 2014-09-23 | Disposition: A | Discharge status: Home / Self Care | Payer: No Typology Code available for payment source | Attending: Emergency Medicine | Admitting: Emergency Medicine

## 2014-09-23 DIAGNOSIS — Z8611 Personal history of tuberculosis: Secondary | ICD-10-CM | POA: Insufficient documentation

## 2014-09-23 DIAGNOSIS — I10 Essential (primary) hypertension: Secondary | ICD-10-CM | POA: Insufficient documentation

## 2014-09-23 DIAGNOSIS — K029 Dental caries, unspecified: Secondary | ICD-10-CM

## 2014-09-23 DIAGNOSIS — Z8619 Personal history of other infectious and parasitic diseases: Secondary | ICD-10-CM | POA: Insufficient documentation

## 2014-09-23 DIAGNOSIS — Z720 Tobacco use: Secondary | ICD-10-CM | POA: Insufficient documentation

## 2014-09-23 DIAGNOSIS — Z79899 Other long term (current) drug therapy: Secondary | ICD-10-CM | POA: Insufficient documentation

## 2014-09-23 DIAGNOSIS — K0889 Other specified disorders of teeth and supporting structures: Secondary | ICD-10-CM

## 2014-09-23 MED ORDER — OXYCODONE-ACETAMINOPHEN 5-325 MG PO TABS
1.0000 | ORAL_TABLET | Freq: Once | ORAL | Status: AC
  Administered 2014-09-23: 1 via ORAL
  Filled 2014-09-23: qty 1

## 2014-09-23 MED ORDER — PENICILLIN V POTASSIUM 500 MG PO TABS: 500.0000 mg | ORAL_TABLET | Freq: Four times a day (QID) | ORAL | Status: DC

## 2014-09-23 MED ORDER — IBUPROFEN 400 MG PO TABS
600.0000 mg | ORAL_TABLET | Freq: Once | ORAL | Status: AC
  Administered 2014-09-23: 600 mg via ORAL
  Filled 2014-09-23 (×2): qty 1

## 2014-09-23 MED ORDER — IBUPROFEN 800 MG PO TABS: 800.0000 mg | ORAL_TABLET | Freq: Three times a day (TID) | ORAL | Status: DC | PRN

## 2014-09-23 MED ORDER — HYDROCODONE-ACETAMINOPHEN 5-325 MG PO TABS: 1.0000 | ORAL_TABLET | Freq: Four times a day (QID) | ORAL | Status: DC | PRN

## 2014-09-23 NOTE — ED Notes (Signed)
Declined W/C at D/C and was escorted to lobby by RN. 

## 2014-09-23 NOTE — ED Notes (Signed)
Pt reports right lower dental pain severe for several days. Airway intact.

## 2014-09-23 NOTE — Discharge Instructions (Signed)
Follow-up with the dentist provided.  Return here as needed. 

## 2014-09-23 NOTE — ED Provider Notes (Signed)
CSN: [id number]     Arrival date & time 09/23/14  1022 History  This chart was scribed for non-physician practitioner, Ebbie Ridge, PA-C, working with Gwyneth Sprout, MD, by Ronney Lion, ED Scribe. This patient was seen in room TR08C/TR08C and the patient's care was started at 11:21 AM.    Chief Complaint  Patient presents with  . Dental Pain   The history is provided by the patient. No language interpreter was used.     HPI Comments: Ariana Jensen is a 49 y.o. female who presents to the Emergency Department complaining of severe right lower dental pain that began yesterday morning.  She was seen for the same problem yesterday, but she states she couldn't stand the pain today. She doesn't have a Education officer, community.    Past Medical History  Diagnosis Date  . Tuberculosis     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 age 85, now s/p 1-yr of multidrug treatment (dates unknown)  . Hypertension   . Hepatitis C antibody test positive   . ASCUS (atypical squamous cells of undetermined significance) on Pap smear   . Opioid dependence   . Trichomonas 04/17/2011    Diagnosed 04/02/11 during hospitalization, treated with Flagyl 2g, patient instructed to have partner treated (must follow-up)    Past Surgical History  Procedure Laterality Date  . Cesarean section    . Left salpingectomy  March 2010    ectopic pregnancy  . Dilation and curettage of uterus    . I&d extremity  10/18/2011    Procedure: IRRIGATION AND DEBRIDEMENT EXTREMITY;  Surgeon: Sharma Covert, MD;  Location: [facility] OR;  Service: Orthopedics;  Laterality: Left;  . I&d extremity  10/21/2011    Procedure: IRRIGATION AND DEBRIDEMENT EXTREMITY;  Surgeon: Sharma Covert, MD;  Location: [facility] OR;  Service: Orthopedics;  Laterality: Left;   Family History  Problem Relation Age of Onset  . Lung cancer Mother   . Lung cancer Father    History  Substance Use Topics  . Smoking status: Current Every Day Smoker -- 1.00 packs/day for 20 years  .  Smokeless tobacco: Never Used  . Alcohol Use: No   OB History    No data available     Review of Systems  HENT: Positive for dental problem.   All other systems reviewed and are negative.     Allergies  Review of patient's allergies indicates no known allergies.  Home Medications   Prior to Admission medications   Medication Sig Start Date End Date Taking? Authorizing Provider  esomeprazole (NEXIUM) 40 MG capsule Take 1 capsule (40 mg total) by mouth daily before breakfast. 06/25/14   Jacquiline Doe, MD  hydrochlorothiazide (HYDRODIURIL) 25 MG tablet Take 1 tablet (25 mg total) by mouth daily. 06/25/14   Jacquiline Doe, MD  lisinopril (PRINIVIL,ZESTRIL) 5 MG tablet Take 1 tablet (5 mg total) by mouth daily. 06/25/14   Jacquiline Doe, MD  methadone (DOLOPHINE) 5 MG/5ML solution Take 50 mg by mouth every morning.     Historical Provider, MD   BP 162/129 mmHg  Pulse 84  Temp(Src) 98.1 F (36.7 C) (Oral)  Resp 20  SpO2 97% Physical Exam  Constitutional: She is oriented to person, place, and time. She appears well-developed and well-nourished. No distress.  HENT:  Head: Normocephalic and atraumatic.  Mouth/Throat: Uvula is midline, oropharynx is clear and moist and mucous membranes are normal. No trismus in the jaw. Abnormal dentition. Dental caries present. No dental abscesses or uvula swelling.    Cardiovascular:  Normal rate, regular rhythm and normal heart sounds.   Pulmonary/Chest: Effort normal and breath sounds normal. No respiratory distress.  Musculoskeletal: Normal range of motion.  Neurological: She is alert and oriented to person, place, and time.  Skin: Skin is warm and dry.  Psychiatric: She has a normal mood and affect. Her behavior is normal.  Nursing note and vitals reviewed.   ED Course  Procedures (including critical care time)  DIAGNOSTIC STUDIES: Oxygen Saturation is 97% on room air, normal by my interpretation.    COORDINATION OF CARE: 11:24 AM -  Discussed treatment plan with pt at bedside which includes antibiotics, pain medication, and referral to on-call dentist, and pt agreed to plan.  I personally performed the services described in this documentation, which was scribed in my presence. The recorded information has been reviewed and is accurate.    Carlyle Dolly, PA-C 09/23/14 1605  Gwyneth Sprout, MD 09/24/14 1504

## 2015-02-21 ENCOUNTER — Encounter (HOSPITAL_COMMUNITY): Payer: Self-pay | Admitting: *Deleted

## 2015-02-21 ENCOUNTER — Emergency Department (HOSPITAL_COMMUNITY): Payer: No Typology Code available for payment source

## 2015-02-21 ENCOUNTER — Emergency Department (HOSPITAL_COMMUNITY)
Admission: EM | Admit: 2015-02-21 | Discharge: 2015-02-21 | Disposition: A | Discharge status: Home / Self Care | Payer: Self-pay | Attending: Emergency Medicine | Admitting: Emergency Medicine

## 2015-02-21 DIAGNOSIS — Z79899 Other long term (current) drug therapy: Secondary | ICD-10-CM | POA: Insufficient documentation

## 2015-02-21 DIAGNOSIS — Z8619 Personal history of other infectious and parasitic diseases: Secondary | ICD-10-CM | POA: Insufficient documentation

## 2015-02-21 DIAGNOSIS — Z720 Tobacco use: Secondary | ICD-10-CM | POA: Insufficient documentation

## 2015-02-21 DIAGNOSIS — R079 Chest pain, unspecified: Secondary | ICD-10-CM | POA: Insufficient documentation

## 2015-02-21 DIAGNOSIS — I10 Essential (primary) hypertension: Secondary | ICD-10-CM | POA: Insufficient documentation

## 2015-02-21 DIAGNOSIS — Z792 Long term (current) use of antibiotics: Secondary | ICD-10-CM | POA: Insufficient documentation

## 2015-02-21 LAB — I-STAT TROPONIN, ED: Troponin i, poc: 0 ng/mL (ref 0.00–0.08)

## 2015-02-21 NOTE — Progress Notes (Signed)
Buddy DutyFelicia Jensen [facility] Partnership for [facility] [phone]  Spoke with patient regarding primary care resources and the [facility] orange card. Patient is an orange card holder and [facility]. Follow up appointment made for June 7,2016 @ 10:30am. Orange card application provided for patient to renew her card. Patient verbalized understanding with this plan of care. Patient educated on the importance of maintaining a relationship with her pcp. My contact information also given for any future questions or concerns.

## 2015-02-21 NOTE — Discharge Instructions (Signed)
Chest Pain (Nonspecific) °It is often hard to give a specific diagnosis for the cause of chest pain. There is always a chance that your pain could be related to something serious, such as a heart attack or a blood clot in the lungs. You need to follow up with your health care provider for further evaluation. °CAUSES  °· Heartburn. °· Pneumonia or bronchitis. °· Anxiety or stress. °· Inflammation around your heart (pericarditis) or lung (pleuritis or pleurisy). °· A blood clot in the lung. °· A collapsed lung (pneumothorax). It can develop suddenly on its own (spontaneous pneumothorax) or from trauma to the chest. °· Shingles infection (herpes zoster virus). °The chest wall is composed of bones, muscles, and cartilage. Any of these can be the source of the pain. °· The bones can be bruised by injury. °· The muscles or cartilage can be strained by coughing or overwork. °· The cartilage can be affected by inflammation and become sore (costochondritis). °DIAGNOSIS  °Lab tests or other studies may be needed to find the cause of your pain. Your health care provider may have you take a test called an ambulatory electrocardiogram (ECG). An ECG records your heartbeat patterns over a 24-hour period. You may also have other tests, such as: °· Transthoracic echocardiogram (TTE). During echocardiography, sound waves are used to evaluate how blood flows through your heart. °· Transesophageal echocardiogram (TEE). °· Cardiac monitoring. This allows your health care provider to monitor your heart rate and rhythm in real time. °· Holter monitor. This is a portable device that records your heartbeat and can help diagnose heart arrhythmias. It allows your health care provider to track your heart activity for several days, if needed. °· Stress tests by exercise or by giving medicine that makes the heart beat faster. °TREATMENT  °· Treatment depends on what may be causing your chest pain. Treatment may include: °¨ Acid blockers for  heartburn. °¨ Anti-inflammatory medicine. °¨ Pain medicine for inflammatory conditions. °¨ Antibiotics if an infection is present. °· You may be advised to change lifestyle habits. This includes stopping smoking and avoiding alcohol, caffeine, and chocolate. °· You may be advised to keep your head raised (elevated) when sleeping. This reduces the chance of acid going backward from your stomach into your esophagus. °Most of the time, nonspecific chest pain will improve within 2-3 days with rest and mild pain medicine.  °HOME CARE INSTRUCTIONS  °· If antibiotics were prescribed, take them as directed. Finish them even if you start to feel better. °· For the next few days, avoid physical activities that bring on chest pain. Continue physical activities as directed. °· Do not use any tobacco products, including cigarettes, chewing tobacco, or electronic cigarettes. °· Avoid drinking alcohol. °· Only take medicine as directed by your health care provider. °· Follow your health care provider's suggestions for further testing if your chest pain does not go away. °· Keep any follow-up appointments you made. If you do not go to an appointment, you could develop lasting (chronic) problems with pain. If there is any problem keeping an appointment, call to reschedule. °SEEK MEDICAL CARE IF:  °· Your chest pain does not go away, even after treatment. °· You have a rash with blisters on your chest. °· You have a fever. °SEEK IMMEDIATE MEDICAL CARE IF:  °· You have increased chest pain or pain that spreads to your arm, neck, jaw, back, or abdomen. °· You have shortness of breath. °· You have an increasing cough, or you cough   up blood.  You have severe back or abdominal pain.  You feel nauseous or vomit.  You have severe weakness.  You faint.  You have chills. This is an emergency. Do not wait to see if the pain will go away. Get medical help at once. Call your local emergency services (911 in [place] Do not drive  yourself to the hospital. MAKE SURE YOU:   Understand these instructions.  Will watch your condition.  Will get help right away if you are not doing well or get worse. Document Released: 06/17/2005 Document Revised: 09/12/2013 Document Reviewed: 04/12/2008 [facility] Patient Information 2015 [employer] [place]. This information is not intended to replace advice given to you by your health care provider. Make sure you discuss any questions you have with your health care provider.  Follow up appointment made for June 7,2016 @ 10:30am, please follow-up with them and discuss all relevant data from today's ED visit. Please return immediately to the emergency room if new or worsening signs or symptoms present

## 2015-02-21 NOTE — ED Provider Notes (Signed)
CSN: [id number]     Arrival date & time 02/21/15  4098 History   First MD Initiated Contact with Patient 02/21/15 [phone]     Chief Complaint  Patient presents with  . Chest Pain    HPI   49 year old female presents with a month-long history of right anterior chest pain. Patient reports a month ago she is slowly developed chest pain with no exacerbating factors including eating, movement, ambulation, deep breathing. Occasionally relieved with Aleve. Patient reports that it has continued, intermittent in nature, with some radiation to her back. Patient denies headache, fever, weight loss, night sweats, cough, shortness of breath, diaphoresis, abdominal pain, lower extremity swelling or edema. Patient has a significant past medical history that includes long-standing smoking history, hypertension, GERD. Reports she is compliant with her blood pressure and GERD medication.   Past Medical History  Diagnosis Date  . Tuberculosis     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 age 21, now s/p 1-yr of multidrug treatment (dates unknown)  . Hypertension   . Hepatitis C antibody test positive   . ASCUS (atypical squamous cells of undetermined significance) on Pap smear   . Opioid dependence   . Trichomonas 04/17/2011    Diagnosed 04/02/11 during hospitalization, treated with Flagyl 2g, patient instructed to have partner treated (must follow-up)    Past Surgical History  Procedure Laterality Date  . Cesarean section    . Left salpingectomy  March 2010    ectopic pregnancy  . Dilation and curettage of uterus    . I&d extremity  10/18/2011    Procedure: IRRIGATION AND DEBRIDEMENT EXTREMITY;  Surgeon: Sharma Covert, MD;  Location: [facility];  Service: Orthopedics;  Laterality: Left;  . I&d extremity  10/21/2011    Procedure: IRRIGATION AND DEBRIDEMENT EXTREMITY;  Surgeon: Sharma Covert, MD;  Location: [facility] OR;  Service: Orthopedics;  Laterality: Left;   Family History  Problem Relation Age of Onset  . Lung  cancer Mother   . Lung cancer Father    History  Substance Use Topics  . Smoking status: Current Every Day Smoker -- 1.00 packs/day for 20 years  . Smokeless tobacco: Never Used  . Alcohol Use: No   OB History    No data available     Review of Systems  All other systems reviewed and are negative.   Allergies  Review of patient's allergies indicates no known allergies.  Home Medications   Prior to Admission medications   Medication Sig Start Date End Date Taking? Authorizing Provider  esomeprazole (NEXIUM) 40 MG capsule Take 1 capsule (40 mg total) by mouth daily before breakfast. 06/25/14   Ardith Dark, MD  hydrochlorothiazide (HYDRODIURIL) 25 MG tablet Take 1 tablet (25 mg total) by mouth daily. 06/25/14   Ardith Dark, MD  HYDROcodone-acetaminophen (NORCO/VICODIN) 5-325 MG per tablet Take 1 tablet by mouth every 6 (six) hours as needed for moderate pain. 09/23/14   Charlestine Night, PA-C  ibuprofen (ADVIL,MOTRIN) 800 MG tablet Take 1 tablet (800 mg total) by mouth every 8 (eight) hours as needed. 09/23/14   Christopher Lawyer, PA-C  lisinopril (PRINIVIL,ZESTRIL) 5 MG tablet Take 1 tablet (5 mg total) by mouth daily. 06/25/14   Ardith Dark, MD  methadone (DOLOPHINE) 5 MG/5ML solution Take 50 mg by mouth every morning.     Historical Provider, MD  penicillin v potassium (VEETID) 500 MG tablet Take 1 tablet (500 mg total) by mouth 4 (four) times daily. 09/23/14   Charlestine Night, PA-C  BP 158/97 mmHg  Pulse 60  Temp(Src) 98.7 F (37.1 C) (Oral)  Resp 19  Ht 5\' 3"  (1.6 m)  Wt 136 lb (61.689 kg)  BMI 24.10 kg/m2  SpO2 97%   Physical Exam  Constitutional: She is oriented to person, place, and time. She appears well-developed and well-nourished.  HENT:  Head: Normocephalic and atraumatic.  Eyes: Pupils are equal, round, and reactive to light.  Neck: Normal range of motion. Neck supple. No JVD present. No tracheal deviation present. No thyromegaly present.   Cardiovascular: Normal rate, regular rhythm, normal heart sounds and intact distal pulses.  Exam reveals no gallop and no friction rub.   No murmur heard. Pulmonary/Chest: Effort normal and breath sounds normal. No stridor. No respiratory distress. She has no wheezes. She has no rales. She exhibits no tenderness.  No signs of trauma, rash, bruising to the chest  Abdominal: Soft. She exhibits no distension and no mass. There is no tenderness. There is no rebound and no guarding.  Musculoskeletal: Normal range of motion.  Lymphadenopathy:    She has no cervical adenopathy.  Neurological: She is alert and oriented to person, place, and time. Coordination normal.  Skin: Skin is warm and dry.  Psychiatric: She has a normal mood and affect. Her behavior is normal. Judgment and thought content normal.  Nursing note and vitals reviewed.   ED Course  Procedures (including critical care time) Labs Review Labs Reviewed  Rosezena Sensor-STAT TROPOININ, ED    Imaging Review Dg Chest 2 View  02/21/2015   CLINICAL DATA:  Chest pain and shortness of breath for 1 day  EXAM: CHEST  2 VIEW  COMPARISON:  04/02/2011  FINDINGS: Cardiac shadow is within normal limits. The lungs are well aerated bilaterally. Minimal scarring is noted in the bases bilaterally stable from the previous exam. No focal infiltrate is seen. No acute bony abnormality is seen.  IMPRESSION: No active cardiopulmonary disease.   Electronically Signed   By: Alcide CleverMark  Lukens M.D.   On: 02/21/2015 10:56     EKG Interpretation   Date/Time:  Thursday February 21 2015 09:31:10 EDT Ventricular Rate:  69 PR Interval:  138 QRS Duration: 82 QT Interval:  480 QTC Calculation: 514 R Axis:   60 Text Interpretation:  Sinus rhythm with occasional Premature ventricular  complexes Left ventricular hypertrophy Prolonged QT No significant change  since last tracing Confirmed by Denton LankSTEINL  MD, Caryn BeeKEVIN ([id number]) on 02/21/2015  9:49:47 AM      MDM   Final diagnoses:  Chest  pain, unspecified chest pain type    Labs: Troponin negative  Imaging: Chest x-ray no significant findings  Consults: None  Therapeutics: None  Assessment: Chest pain  Plan: Patient presents with chest pain today, heart score of 2 normal EKG, negative troponin, this is unlikely to be ACS at this time. Patient has no fevers chills, findings on chest x-ray that would indicate pneumonia or pulmonary etiology. Patient is perk negative. She will be discharged home with instructions to follow closely with primary care provider to discuss today's relevant information, and options for stress testing. Patient was informed of her hypertension, reports she is taking her medications, instructions for primary care reevaluation given. Strict return precautions given, Patient verbalized her understanding and agreement today's plan and had no further questions or concerns.      Eyvonne MechanicJeffrey Raevon Broom, PA-C 02/22/15 [phone]  Cathren LaineKevin Steinl, MD 02/22/15 1440

## 2015-02-21 NOTE — ED Notes (Signed)
NAD at this time. Pt is stable and going home.  

## 2015-02-21 NOTE — ED Notes (Signed)
Pt started having chest pain about 1 month ago. It comes and goes. She thought it was related to smoking, however it cane on last night and has'nt went away, She came to the ED for further eval.

## 2015-02-26 ENCOUNTER — Ambulatory Visit: Payer: No Typology Code available for payment source | Admitting: Family Medicine

## 2015-03-07 ENCOUNTER — Ambulatory Visit (INDEPENDENT_AMBULATORY_CARE_PROVIDER_SITE_OTHER): Payer: Self-pay | Admitting: Family Medicine

## 2015-03-07 ENCOUNTER — Encounter: Payer: Self-pay | Admitting: Family Medicine

## 2015-03-07 VITALS — BP 146/87 | HR 78 | Temp 98.6°F | Ht 63.0 in | Wt 136.0 lb

## 2015-03-07 DIAGNOSIS — Z1211 Encounter for screening for malignant neoplasm of colon: Secondary | ICD-10-CM | POA: Insufficient documentation

## 2015-03-07 DIAGNOSIS — Z720 Tobacco use: Secondary | ICD-10-CM

## 2015-03-07 DIAGNOSIS — Z0000 Encounter for general adult medical examination without abnormal findings: Secondary | ICD-10-CM

## 2015-03-07 DIAGNOSIS — I10 Essential (primary) hypertension: Secondary | ICD-10-CM

## 2015-03-07 DIAGNOSIS — R079 Chest pain, unspecified: Secondary | ICD-10-CM | POA: Insufficient documentation

## 2015-03-07 DIAGNOSIS — Z79899 Other long term (current) drug therapy: Secondary | ICD-10-CM

## 2015-03-07 LAB — BASIC METABOLIC PANEL
BUN: 11 mg/dL (ref 6–23)
CALCIUM: 9.4 mg/dL (ref 8.4–10.5)
CO2: 29 mEq/L (ref 19–32)
CREATININE: 0.71 mg/dL (ref 0.50–1.10)
Chloride: 99 mEq/L (ref 96–112)
Glucose, Bld: 80 mg/dL (ref 70–99)
Potassium: 4.1 mEq/L (ref 3.5–5.3)
Sodium: 137 mEq/L (ref 135–145)

## 2015-03-07 LAB — CBC
HEMATOCRIT: 36 % (ref 36.0–46.0)
Hemoglobin: 11.8 g/dL — ABNORMAL LOW (ref 12.0–15.0)
MCH: 27.6 pg (ref 26.0–34.0)
MCHC: 32.8 g/dL (ref 30.0–36.0)
MCV: 84.3 fL (ref 78.0–100.0)
MPV: 10.7 fL (ref 8.6–12.4)
Platelets: 303 10*3/uL (ref 150–400)
RBC: 4.27 MIL/uL (ref 3.87–5.11)
RDW: 19.5 % — ABNORMAL HIGH (ref 11.5–15.5)
WBC: 11.5 10*3/uL — ABNORMAL HIGH (ref 4.0–10.5)

## 2015-03-07 LAB — LIPID PANEL
CHOL/HDL RATIO: 2.6 ratio
Cholesterol: 162 mg/dL (ref 0–200)
HDL: 63 mg/dL (ref >=46)
LDL CALC: 73 mg/dL (ref 0–99)
Triglycerides: 129 mg/dL (ref <150)
VLDL: 26 mg/dL (ref 0–40)

## 2015-03-07 LAB — POCT GLYCOSYLATED HEMOGLOBIN (HGB A1C): Hemoglobin A1C: 5.5

## 2015-03-07 MED ORDER — LISINOPRIL 10 MG PO TABS: 5.0000 mg | ORAL_TABLET | Freq: Every day | ORAL | Status: DC

## 2015-03-07 NOTE — Patient Instructions (Signed)
Thank you for coming to the clinic today. It was nice seeing you.  For your blood pressure, we will increase your lisinopril to 10mg .   For your chest pain, we will be obtaining a stress test to make sure it is not an issue with your heart.  If you need help smoking, please ask the font desk to schedule an appointment with the smoking cessation clinic  We will be checking your cholesterol and blood sugar today. You will receive a call or a letter with these results.  You are due for a pap test. Please let us know when you would like to have this performed.   See you again in 3 months or sooner if you need anything else.

## 2015-03-07 NOTE — Assessment & Plan Note (Signed)
Atypical in nature, though patient with several risk factors for cardiac disease including tobacco abuse and uncontrolled hypertension. EKG in office today unchanged from ED EKG (significant for LVH). Will refer to cardiology for stress testing. Will also obtain lipid panel and A1c.

## 2015-03-07 NOTE — Assessment & Plan Note (Signed)
Counseling given. Encouraged patient to follow up in smoking cessation clinic.

## 2015-03-07 NOTE — Assessment & Plan Note (Signed)
Due for pap. Discussed with patient. Deferred for today.

## 2015-03-07 NOTE — Assessment & Plan Note (Addendum)
Will increase lisinopril to 10mg  daily. Continue HCTZ. Will obtain BMP today.   ADDENDUM: Lipids, A1c at goal. Other labs unremarkable. Letter sent.

## 2015-03-07 NOTE — Progress Notes (Addendum)
Ariana Jensen is a 49 y.o. female who presents to the [facility] today with a chief complaint of chest pain and blood pressure follow up. Her concerns today include:  HPI:  CP Symptoms started about a month ago. Pain located in middle of chest and described as pressure-like sensation. Pain is intermittent and usually lasts for hours to days. Two weeks ago pain became severe enough for patient to present to the ED. There, workup showed normal troponin, CXR, and non-acute EKG. Patient reports that pain had resolved until last night.   No known precipitating events. Pain can occur at random. Has not noticed anything that makes pain worse. Pain is alleviated somewhat with Aleve. Patient feels like she is more short of breath when she has the chest pain. No nausea or vomiting. No diaphoresis. No headaches or vision changes. No orthopnea. No PND  Hypertension BP Readings from Last 3 Encounters:  03/07/15 146/87  02/21/15 173/85  09/23/14 173/114   Home BP monitoring-Yes, usually in 160s/90s Compliant with medications-yes without side effects ROS-Denies any CP, HA, SOB, blurry vision, LE edema, transient weakness, orthopnea, PND.    Tobacco Abuse Continues to smoke, though is actively trying to quit. Says that chantix makes her nauseated.   ROS: As per HPI, otherwise all systems reviewed and are negative.  Past Medical History - Reviewed and updated Patient Active Problem List   Diagnosis Date Noted  . Chest pain 03/07/2015  . Healthcare maintenance 03/07/2015  . RLQ abdominal pain 02/18/2012  . GERD (gastroesophageal reflux disease) 01/28/2012  . Menorrhagia 01/25/2012  . Weakness generalized 12/05/2011  . Tobacco abuse 11/06/2011  . HTN (hypertension) 10/22/2011  . Abscess and cellulitis 10/22/2011  . Hepatitis C, chronic 10/22/2011  . History of TB (tuberculosis) 10/22/2011  . Opioid dependence   . Polysubstance abuse 04/17/2011    Medications- reviewed and updated Current  Outpatient Prescriptions  Medication Sig Dispense Refill  . esomeprazole (NEXIUM) 40 MG capsule Take 1 capsule (40 mg total) by mouth daily before breakfast. 90 capsule 3  . hydrochlorothiazide (HYDRODIURIL) 25 MG tablet Take 1 tablet (25 mg total) by mouth daily. 90 tablet 0  . HYDROcodone-acetaminophen (NORCO/VICODIN) 5-325 MG per tablet Take 1 tablet by mouth every 6 (six) hours as needed for moderate pain. 15 tablet 0  . ibuprofen (ADVIL,MOTRIN) 800 MG tablet Take 1 tablet (800 mg total) by mouth every 8 (eight) hours as needed. 21 tablet 0  . lisinopril (PRINIVIL,ZESTRIL) 10 MG tablet Take 0.5 tablets (5 mg total) by mouth daily. 90 tablet 0  . methadone (DOLOPHINE) 5 MG/5ML solution Take 50 mg by mouth every morning.     . penicillin v potassium (VEETID) 500 MG tablet Take 1 tablet (500 mg total) by mouth 4 (four) times daily. 28 tablet 0   Current Facility-Administered Medications  Medication Dose Route Frequency Provider Last Rate Last Dose  . hepatitis A virus (PF) vaccine (HAVRIX (PF)) 1440 EL U/ML injection 1,440 Units  1 mL Intramuscular Once Shelva Majestic, MD        Objective: Physical Exam: BP 146/87 mmHg  Pulse 78  Temp(Src) 98.6 F (37 C) (Oral)  Ht  (1.6 m)  Wt 136 lb (61.689 kg)  BMI 24.10 kg/m2  SpO2 96%  Gen: NAD, resting comfortably CV: RRR with no murmurs appreciated Lungs: NWOB, CTAB with no crackles, wheezes, or rhonchi Abdomen: Normal bowel sounds present. Soft, Nontender, Nondistended. Ext: no edema Skin: warm, dry Neuro: grossly normal, moves all extremities  Results for orders placed or performed in visit on 03/07/15 (from the past 72 hour(s))  Lipid Panel     Status: None   Collection Time: 03/07/15 11:55 AM  Result Value Ref Range   Cholesterol 162 0 - 200 mg/dL    Comment: ATP III Classification:       < 200        mg/dL        Desirable      478 - 239     mg/dL        Borderline High      >= 240        mg/dL        High       Triglycerides 129 <150 mg/dL   HDL 63 >=29 mg/dL    Comment: ** Please note change in reference range(s). **   Total CHOL/HDL Ratio 2.6 Ratio   VLDL 26 0 - 40 mg/dL   LDL Cholesterol 73 0 - 99 mg/dL    Comment:   Total Cholesterol/HDL Ratio:CHD Risk                        Coronary Heart Disease Risk Table                                        Men       Women          1/2 Average Risk              3.4        3.3              Average Risk              5.0        4.4           2X Average Risk              9.6        7.1           3X Average Risk             23.4       11.0 Use the calculated Patient Ratio above and the CHD Risk table  to determine the patient's CHD Risk. ATP III Classification (LDL):       < 100        mg/dL         Optimal      562 - 129     mg/dL         Near or Above Optimal      130 - 159     mg/dL         Borderline High      160 - 189     mg/dL         High       > 130        mg/dL         Very High     CBC     Status: Abnormal   Collection Time: 03/07/15 11:55 AM  Result Value Ref Range   WBC 11.5 (H) 4.0 - 10.5 K/uL   RBC 4.27 3.87 - 5.11 MIL/uL   Hemoglobin 11.8 (L) 12.0 - 15.0 g/dL   HCT 86.5 78.4 - 69.6 %   MCV 84.3  78.0 - 100.0 fL   MCH 27.6 26.0 - 34.0 pg   MCHC 32.8 30.0 - 36.0 g/dL   RDW 24.4 (H) 97.5 - 30.0 %   Platelets 303 150 - 400 K/uL   MPV 10.7 8.6 - 12.4 fL  Basic metabolic panel     Status: None   Collection Time: 03/07/15 11:55 AM  Result Value Ref Range   Sodium 137 135 - 145 mEq/L   Potassium 4.1 3.5 - 5.3 mEq/L   Chloride 99 96 - 112 mEq/L   CO2 29 19 - 32 mEq/L   Glucose, Bld 80 70 - 99 mg/dL   BUN 11 6 - 23 mg/dL   Creat 5.11 0.21 - 1.17 mg/dL   Calcium 9.4 8.4 - 35.6 mg/dL  POCT glycosylated hemoglobin (Hb A1C)     Status: None   Collection Time: 03/07/15 12:00 PM  Result Value Ref Range   Hemoglobin A1C 5.5     A/P: See problem list  Chest pain Atypical in nature, though patient with several risk factors for  cardiac disease including tobacco abuse and uncontrolled hypertension. EKG in office today unchanged from ED EKG (significant for LVH). Will refer to cardiology for stress testing. Will also obtain lipid panel and A1c.   HTN (hypertension) Will increase lisinopril to 10mg  daily. Continue HCTZ. Will obtain BMP today.   ADDENDUM: Lipids, A1c at goal. Other labs unremarkable. Letter sent.   Tobacco abuse Counseling given. Encouraged patient to follow up in smoking cessation clinic.   Healthcare maintenance Due for pap. Discussed with patient. Deferred for today.     Orders Placed This Encounter  Procedures  . Lipid Panel  . CBC  . Basic metabolic panel  . Ambulatory referral to Cardiology    Referral Priority:  Routine    Referral Type:  Consultation    Referral Reason:  Specialty Services Required    Requested Specialty:  Cardiology    Number of Visits Requested:  1  . POCT glycosylated hemoglobin (Hb A1C)  . EKG 12-Lead    Meds ordered this encounter  Medications  . lisinopril (PRINIVIL,ZESTRIL) 10 MG tablet    Sig: Take 0.5 tablets (5 mg total) by mouth daily.    Dispense:  90 tablet    Refill:  0     Charlotte Brafford M. [name], MD [facility] Family Medicine Resident PGY-1 03/08/2015 9:03 AM

## 2015-03-08 ENCOUNTER — Encounter: Payer: Self-pay | Admitting: Family Medicine

## 2015-04-02 ENCOUNTER — Encounter (HOSPITAL_COMMUNITY): Payer: Self-pay | Admitting: Emergency Medicine

## 2015-04-02 ENCOUNTER — Emergency Department (HOSPITAL_COMMUNITY)
Admission: EM | Admit: 2015-04-02 | Discharge: 2015-04-02 | Disposition: A | Discharge status: Home / Self Care | Payer: Medicaid Other | Attending: Emergency Medicine | Admitting: Emergency Medicine

## 2015-04-02 ENCOUNTER — Emergency Department (HOSPITAL_COMMUNITY): Payer: Medicaid Other

## 2015-04-02 DIAGNOSIS — N938 Other specified abnormal uterine and vaginal bleeding: Secondary | ICD-10-CM | POA: Diagnosis not present

## 2015-04-02 DIAGNOSIS — I10 Essential (primary) hypertension: Secondary | ICD-10-CM | POA: Diagnosis not present

## 2015-04-02 DIAGNOSIS — Z720 Tobacco use: Secondary | ICD-10-CM | POA: Diagnosis not present

## 2015-04-02 DIAGNOSIS — Z79899 Other long term (current) drug therapy: Secondary | ICD-10-CM | POA: Insufficient documentation

## 2015-04-02 DIAGNOSIS — R1032 Left lower quadrant pain: Secondary | ICD-10-CM

## 2015-04-02 DIAGNOSIS — Z8619 Personal history of other infectious and parasitic diseases: Secondary | ICD-10-CM | POA: Diagnosis not present

## 2015-04-02 DIAGNOSIS — Z8611 Personal history of tuberculosis: Secondary | ICD-10-CM | POA: Insufficient documentation

## 2015-04-02 DIAGNOSIS — N939 Abnormal uterine and vaginal bleeding, unspecified: Secondary | ICD-10-CM | POA: Diagnosis present

## 2015-04-02 LAB — I-STAT BETA HCG BLOOD, ED (MC, WL, AP ONLY): I-stat hCG, quantitative: <5 m[IU]/mL (ref <5)

## 2015-04-02 LAB — WET PREP, GENITAL
Clue Cells Wet Prep HPF POC: NONE SEEN
WBC WET PREP: NONE SEEN
Yeast Wet Prep HPF POC: NONE SEEN

## 2015-04-02 LAB — COMPREHENSIVE METABOLIC PANEL
ALK PHOS: 55 U/L (ref 38–126)
ALT: 15 U/L (ref 14–54)
AST: 31 U/L (ref 15–41)
Albumin: 3.3 g/dL — ABNORMAL LOW (ref 3.5–5.0)
Anion gap: 11 (ref 5–15)
BUN: 18 mg/dL (ref 6–20)
CO2: 23 mmol/L (ref 22–32)
CREATININE: 0.83 mg/dL (ref 0.44–1.00)
Calcium: 8.7 mg/dL — ABNORMAL LOW (ref 8.9–10.3)
Chloride: 100 mmol/L — ABNORMAL LOW (ref 101–111)
GFR calc Af Amer: >60 mL/min (ref >60)
GFR calc non Af Amer: >60 mL/min (ref >60)
GLUCOSE: 84 mg/dL (ref 65–99)
POTASSIUM: 4.1 mmol/L (ref 3.5–5.1)
SODIUM: 134 mmol/L — AB (ref 135–145)
Total Bilirubin: 0.6 mg/dL (ref 0.3–1.2)
Total Protein: 6.3 g/dL — ABNORMAL LOW (ref 6.5–8.1)

## 2015-04-02 LAB — URINALYSIS, ROUTINE W REFLEX MICROSCOPIC
Glucose, UA: NEGATIVE mg/dL
KETONES UR: 15 mg/dL — AB
NITRITE: NEGATIVE
PH: 5 (ref 5.0–8.0)
Protein, ur: 30 mg/dL — AB
Specific Gravity, Urine: 1.039 — ABNORMAL HIGH (ref 1.005–1.030)
UROBILINOGEN UA: 1 mg/dL (ref 0.0–1.0)

## 2015-04-02 LAB — TYPE AND SCREEN
ABO/RH(D): A POS
Antibody Screen: NEGATIVE

## 2015-04-02 LAB — CBC
HEMATOCRIT: 29.2 % — AB (ref 36.0–46.0)
HEMOGLOBIN: 9.3 g/dL — AB (ref 12.0–15.0)
MCH: 28.4 pg (ref 26.0–34.0)
MCHC: 31.8 g/dL (ref 30.0–36.0)
MCV: 89 fL (ref 78.0–100.0)
PLATELETS: 215 10*3/uL (ref 150–400)
RBC: 3.28 MIL/uL — AB (ref 3.87–5.11)
RDW: 17.4 % — AB (ref 11.5–15.5)
WBC: 8.7 10*3/uL (ref 4.0–10.5)

## 2015-04-02 LAB — URINE MICROSCOPIC-ADD ON

## 2015-04-02 LAB — PROTIME-INR
INR: 1.02 (ref 0.00–1.49)
PROTHROMBIN TIME: 13.6 s (ref 11.6–15.2)

## 2015-04-02 MED ORDER — MEDROXYPROGESTERONE ACETATE 150 MG/ML IM SUSP
150.0000 mg | Freq: Once | INTRAMUSCULAR | Status: AC
  Administered 2015-04-02: 150 mg via INTRAMUSCULAR
  Filled 2015-04-02: qty 1

## 2015-04-02 MED ORDER — SODIUM CHLORIDE 0.9 % IV SOLN
INTRAVENOUS | Status: DC
  Administered 2015-04-02: 08:00:00 via INTRAVENOUS

## 2015-04-02 MED ORDER — MORPHINE SULFATE 4 MG/ML IJ SOLN
4.0000 mg | Freq: Once | INTRAMUSCULAR | Status: AC
  Administered 2015-04-02: 4 mg via INTRAVENOUS
  Filled 2015-04-02: qty 1

## 2015-04-02 NOTE — ED Notes (Signed)
CBC and coag tubes were clotted per lab, sample redrawn and sent to lab.  Lab notified.

## 2015-04-02 NOTE — ED Provider Notes (Signed)
CSN: [id number]     Arrival date & time 04/02/15  4098 History   First MD Initiated Contact with Patient 04/02/15 [phone]     Chief Complaint  Patient presents with  . Vaginal Bleeding     (Consider location/radiation/quality/duration/timing/severity/associated sxs/prior Treatment) HPI Patient had no menstrual cycle for approximately 2 months. She thought she was menopausal period 3 weeks ago she had about 3-4 days of bleeding with mild cramping consistent with her menses. 3 days ago she started having heavy bleeding and left lower quadrant pain and cramping. She reports that as of yesterday she started passing very large clots and "gushing" blood. She reports today she is developing some lightheadedness. She has not had any syncopal episodes. She has otherwise been well. Poor she has a history of tubal pregnancy 6 years ago. Patient is not on any birth control. She denies history of dysfunction uterine bleeding or fibroids. Past Medical History  Diagnosis Date  . Tuberculosis     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 age 72, now s/p 1-yr of multidrug treatment (dates unknown)  . Hypertension   . Hepatitis C antibody test positive   . ASCUS (atypical squamous cells of undetermined significance) on Pap smear   . Opioid dependence   . Trichomonas 04/17/2011    Diagnosed 04/02/11 during hospitalization, treated with Flagyl 2g, patient instructed to have partner treated (must follow-up)    Past Surgical History  Procedure Laterality Date  . Cesarean section    . Left salpingectomy  March 2010    ectopic pregnancy  . Dilation and curettage of uterus    . I&d extremity  10/18/2011    Procedure: IRRIGATION AND DEBRIDEMENT EXTREMITY;  Surgeon: Sharma Covert, MD;  Location: [facility] OR;  Service: Orthopedics;  Laterality: Left;  . I&d extremity  10/21/2011    Procedure: IRRIGATION AND DEBRIDEMENT EXTREMITY;  Surgeon: Sharma Covert, MD;  Location: [facility] OR;  Service: Orthopedics;  Laterality: Left;    Family History  Problem Relation Age of Onset  . Lung cancer Mother   . Lung cancer Father    History  Substance Use Topics  . Smoking status: Current Every Day Smoker -- 1.00 packs/day for 20 years  . Smokeless tobacco: Never Used  . Alcohol Use: No   OB History    No data available     Review of Systems 10 Systems reviewed and are negative for acute change except as noted in the HPI.    Allergies  Review of patient's allergies indicates no known allergies.  Home Medications   Prior to Admission medications   Medication Sig Start Date End Date Taking? Authorizing Provider  Aspirin-Acetaminophen (GOODY BODY PAIN) 500-325 MG PACK Take 1 packet by mouth every 8 (eight) hours as needed (pain).   Yes Historical Provider, MD  esomeprazole (NEXIUM) 40 MG capsule Take 1 capsule (40 mg total) by mouth daily before breakfast. 06/25/14  Yes Ardith Dark, MD  hydrochlorothiazide (HYDRODIURIL) 25 MG tablet Take 1 tablet (25 mg total) by mouth daily. 06/25/14  Yes Ardith Dark, MD  HYDROcodone-acetaminophen (NORCO/VICODIN) 5-325 MG per tablet Take 1 tablet by mouth every 6 (six) hours as needed for moderate pain. 09/23/14  Yes Christopher Lawyer, PA-C  ibuprofen (ADVIL,MOTRIN) 800 MG tablet Take 1 tablet (800 mg total) by mouth every 8 (eight) hours as needed. 09/23/14  Yes Christopher Lawyer, PA-C  lisinopril (PRINIVIL,ZESTRIL) 10 MG tablet Take 0.5 tablets (5 mg total) by mouth daily. Patient taking differently: Take 10 mg  by mouth daily.  03/07/15  Yes Ardith Dark, MD  methadone (DOLOPHINE) 5 MG/5ML solution Take 50 mg by mouth every morning.    Yes Historical Provider, MD  penicillin v potassium (VEETID) 500 MG tablet Take 1 tablet (500 mg total) by mouth 4 (four) times daily. Patient not taking: Reported on 04/02/2015 09/23/14   Charlestine Night, PA-C   BP 141/82 mmHg  Pulse 67  Temp(Src) 97.8 F (36.6 C) (Oral)  Resp 18  Ht 5\' 3"  (1.6 m)  Wt 142 lb (64.411 kg)  BMI 25.16  kg/m2  SpO2 100% Physical Exam  Constitutional: She is oriented to person, place, and time. She appears well-developed and well-nourished.  Color is good. Patient is alert. No respiratory distress. Patient has well appearance.  HENT:  Head: Normocephalic and atraumatic.  Eyes: EOM are normal. Pupils are equal, round, and reactive to light.  Neck: Neck supple.  Cardiovascular: Normal rate, regular rhythm, normal heart sounds and intact distal pulses.   Pulmonary/Chest: Effort normal and breath sounds normal.  Abdominal: Soft. Bowel sounds are normal. She exhibits no distension. There is tenderness.  Mild to moderate left lower quadrant tenderness to palpation. No guarding and no palpable mass  Genitourinary:  Normal external female genitalia. Speculum examination pool of blood in the vaginal vault and one moderate sized clot removed. Cervix is visualized to be normal appearance without friability. There is a slow continuous trickle of blood from the cervix. No clot is retained within the cervix or brisk bleeding. Bimanual examination is diffusely tender.  Musculoskeletal: Normal range of motion. She exhibits no edema.  Neurological: She is alert and oriented to person, place, and time. She has normal strength. Coordination normal. GCS eye subscore is 4. GCS verbal subscore is 5. GCS motor subscore is 6.  Skin: Skin is warm, dry and intact.  Psychiatric: She has a normal mood and affect.    ED Course  Procedures (including critical care time) Labs Review Labs Reviewed  COMPREHENSIVE METABOLIC PANEL - Abnormal; Notable for the following:    Sodium 134 (*)    Chloride 100 (*)    Calcium 8.7 (*)    Total Protein 6.3 (*)    Albumin 3.3 (*)    All other components within normal limits  URINALYSIS, ROUTINE W REFLEX MICROSCOPIC (NOT AT [facility]) - Abnormal; Notable for the following:    Color, Urine RED (*)    APPearance CLOUDY (*)    Specific Gravity, Urine 1.039 (*)    Hgb urine dipstick  LARGE (*)    Bilirubin Urine SMALL (*)    Ketones, ur 15 (*)    Protein, ur 30 (*)    Leukocytes, UA SMALL (*)    All other components within normal limits  CBC - Abnormal; Notable for the following:    RBC 3.28 (*)    Hemoglobin 9.3 (*)    HCT 29.2 (*)    RDW 17.4 (*)    All other components within normal limits  URINE MICROSCOPIC-ADD ON - Abnormal; Notable for the following:    Squamous Epithelial / LPF FEW (*)    Bacteria, UA FEW (*)    All other components within normal limits  WET PREP, GENITAL  PROTIME-INR  I-STAT BETA HCG BLOOD, ED ([facility], [facility], [facility] ONLY)  I-STAT BETA HCG BLOOD, ED ([facility], [facility], [facility] ONLY)  TYPE AND SCREEN  GC/CHLAMYDIA PROBE AMP ([place]) NOT AT [facility]    Imaging Review US Transvaginal Non-ob  04/02/2015   CLINICAL  DATA:  Left lower quadrant pain. Vaginal bleeding for 4 days. Unsure of LMP.  EXAM: TRANSABDOMINAL AND TRANSVAGINAL ULTRASOUND OF PELVIS  TECHNIQUE: Both transabdominal and transvaginal ultrasound examinations of the pelvis were performed. Transabdominal technique was performed for global imaging of the pelvis including uterus, ovaries, adnexal regions, and pelvic cul-de-sac. It was necessary to proceed with endovaginal exam following the transabdominal exam to visualize the endometrium and ovaries.  COMPARISON:  01/28/2012  FINDINGS: Uterus  Measurements: 8.3 x 3.9 x 5.0 cm. No fibroids or other mass visualized.  Endometrium  Thickness: 9 mm.  No focal abnormality visualized.  Right ovary  Measurements: 2.9 x 2.8 x 2.2 cm. Normal appearance/no adnexal mass.  Left ovary  Measurements: 2.8 x 1.4 x 2.8 cm. Normal appearance/no adnexal mass.  Other findings  No free fluid.  IMPRESSION: No evidence of uterine fibroids. Endometrial thickness measures 9 mm. If bleeding remains unresponsive to hormonal or medical therapy, sonohysterogram should be considered for focal lesion work-up. (Ref: Radiological Reasoning: Algorithmic Workup of Abnormal Vaginal Bleeding with  Endovaginal Sonography and Sonohysterography. AJR 2008; 147:W29-56; 191:S68-73).  Normal appearance of both ovaries.   Electronically Signed   By: Myles RosenthalJohn  Stahl M.D.   On: 04/02/2015 09:52   [place] Pelvis Complete  04/02/2015   CLINICAL DATA:  Left lower quadrant pain. Vaginal bleeding for 4 days. Unsure of LMP.  EXAM: TRANSABDOMINAL AND TRANSVAGINAL ULTRASOUND OF PELVIS  TECHNIQUE: Both transabdominal and transvaginal ultrasound examinations of the pelvis were performed. Transabdominal technique was performed for global imaging of the pelvis including uterus, ovaries, adnexal regions, and pelvic cul-de-sac. It was necessary to proceed with endovaginal exam following the transabdominal exam to visualize the endometrium and ovaries.  COMPARISON:  01/28/2012  FINDINGS: Uterus  Measurements: 8.3 x 3.9 x 5.0 cm. No fibroids or other mass visualized.  Endometrium  Thickness: 9 mm.  No focal abnormality visualized.  Right ovary  Measurements: 2.9 x 2.8 x 2.2 cm. Normal appearance/no adnexal mass.  Left ovary  Measurements: 2.8 x 1.4 x 2.8 cm. Normal appearance/no adnexal mass.  Other findings  No free fluid.  IMPRESSION: No evidence of uterine fibroids. Endometrial thickness measures 9 mm. If bleeding remains unresponsive to hormonal or medical therapy, sonohysterogram should be considered for focal lesion work-up. (Ref: Radiological Reasoning: Algorithmic Workup of Abnormal Vaginal Bleeding with Endovaginal Sonography and Sonohysterography. AJR 2008; 213:Y86-57; 191:S68-73).  Normal appearance of both ovaries.   Electronically Signed   By: Myles RosenthalJohn  Stahl M.D.   On: 04/02/2015 09:52     EKG Interpretation None      MDM   Final diagnoses:  DUB (dysfunctional uterine bleeding)   Patient has well appearance. She is not orthostatic. Vaginal bleeding is not hemorrhagic in nature. She does have a gynecologist and is advised to call today to schedule her follow-up this week. Patient will be given one dose of Depo Provera 100mg  for dysfunction  uterine bleeding. She has no other acute complaints. Harriett Sineancy is negative and ultrasound does not show acute complications.    Arby BarretteMarcy Cannie Muckle, MD 04/02/15 1136

## 2015-04-02 NOTE — Discharge Instructions (Signed)
Dysfunctional Uterine Bleeding Normally, menstrual periods begin between ages 11 to 17 in young women. A normal menstrual cycle/period may begin every 23 days up to 35 days and lasts from 1 to 7 days. Around 12 to 14 days before your menstrual period starts, ovulation (ovary produces an egg) occurs. When counting the time between menstrual periods, count from the first day of bleeding of the previous period to the first day of bleeding of the next period. Dysfunctional (abnormal) uterine bleeding is bleeding that is different from a normal menstrual period. Your periods may come earlier or later than usual. They may be lighter, have blood clots or be heavier. You may have bleeding between periods, or you may skip one period or more. You may have bleeding after sexual intercourse, bleeding after menopause, or no menstrual period. CAUSES   Pregnancy (normal, miscarriage, tubal).  IUDs (intrauterine device, birth control).  Birth control pills.  Hormone treatment.  Menopause.  Infection of the cervix.  Blood clotting problems.  Infection of the inside lining of the uterus.  Endometriosis, inside lining of the uterus growing in the pelvis and other female organs.  Adhesions (scar tissue) inside the uterus.  Obesity or severe weight loss.  Uterine polyps inside the uterus.  Cancer of the vagina, cervix, or uterus.  Ovarian cysts or polycystic ovary syndrome.  Medical problems (diabetes, thyroid disease).  Uterine fibroids (noncancerous tumor).  Problems with your female hormones.  Endometrial hyperplasia, very thick lining and enlarged cells inside of the uterus.  Medicines that interfere with ovulation.  Radiation to the pelvis or abdomen.  Chemotherapy. DIAGNOSIS   Your doctor will discuss the history of your menstrual periods, medicines you are taking, changes in your weight, stress in your life, and any medical problems you may have.  Your doctor will do a physical  and pelvic examination.  Your doctor may want to perform certain tests to make a diagnosis, such as:  Pap test.  Blood tests.  Cultures for infection.  CT scan.  Ultrasound.  Hysteroscopy.  Laparoscopy.  MRI.  Hysterosalpingography.  D and C.  Endometrial biopsy. TREATMENT  Treatment will depend on the cause of the dysfunctional uterine bleeding (DUB). Treatment may include:  Observing your menstrual periods for a couple of months.  Prescribing medicines for medical problems, including:  Antibiotics.  Hormones.  Birth control pills.  Removing an IUD (intrauterine device, birth control).  Surgery:  D and C (scrape and remove tissue from inside the uterus).  Laparoscopy (examine inside the abdomen with a lighted tube).  Uterine ablation (destroy lining of the uterus with electrical current, laser, heat, or freezing).  Hysteroscopy (examine cervix and uterus with a lighted tube).  Hysterectomy (remove the uterus). HOME CARE INSTRUCTIONS   If medicines were prescribed, take exactly as directed. Do not change or switch medicines without consulting your caregiver.  Long term heavy bleeding may result in iron deficiency. Your caregiver may have prescribed iron pills. They help replace the iron that your body lost from heavy bleeding. Take exactly as directed.  Do not take aspirin or medicines that contain aspirin one week before or during your menstrual period. Aspirin may make the bleeding worse.  If you need to change your sanitary pad or tampon more than once every 2 hours, stay in bed with your feet elevated and a cold pack on your lower abdomen. Rest as much as possible, until the bleeding stops or slows down.  Eat well-balanced meals. Eat foods high in iron. Examples   are:  Leafy green vegetables.  Whole-grain breads and cereals.  Eggs.  Meat.  Liver.  Do not try to lose weight until the abnormal bleeding has stopped and your blood iron level is  back to normal. Do not lift more than ten pounds or do strenuous activities when you are bleeding.  For a couple of months, make note on your calendar, marking the start and ending of your period, and the type of bleeding (light, medium, heavy, spotting, clots or missed periods). This is for your caregiver to better evaluate your problem. SEEK MEDICAL CARE IF:   You develop nausea (feeling sick to your stomach) and vomiting, dizziness, or diarrhea while you are taking your medicine.  You are getting lightheaded or weak.  You have any problems that may be related to the medicine you are taking.  You develop pain with your DUB.  You want to remove your IUD.  You want to stop or change your birth control pills or hormones.  You have any type of abnormal bleeding mentioned above.  You are over 16 years old and have not had a menstrual period yet.  You are 49 years old and you are still having menstrual periods.  You have any of the symptoms mentioned above.  You develop a rash. SEEK IMMEDIATE MEDICAL CARE IF:   An oral temperature above 102 F (38.9 C) develops.  You develop chills.  You are changing your sanitary pad or tampon more than once an hour.  You develop abdominal pain.  You pass out or faint. Document Released: 09/04/2000 Document Revised: 11/30/2011 Document Reviewed: 08/06/2009 Exit[employer] Patient Information 2015 [employer]. This information is not intended to replace advice given to you by your health care provider. Make sure you discuss any questions you have with your health care provider.  

## 2015-04-02 NOTE — ED Notes (Signed)
Vaginal bleeding x 4 days, states she missed a couple months and at first she thought it was just her period returning but states that the flow has been extremely heavy, has changed 3-4 pads each hour over past couple days.  Also c/o left sided pelvic pain radiating to back.  States the clots are very large and the flow is "like a faucet" when she uses bathroom.

## 2015-04-03 ENCOUNTER — Ambulatory Visit: Payer: No Typology Code available for payment source | Admitting: Family Medicine

## 2015-04-03 LAB — GC/CHLAMYDIA PROBE AMP (~~LOC~~) NOT AT ARMC
Chlamydia: NEGATIVE
Neisseria Gonorrhea: NEGATIVE

## 2015-04-10 ENCOUNTER — Other Ambulatory Visit: Payer: Self-pay | Admitting: Family Medicine

## 2015-04-10 ENCOUNTER — Other Ambulatory Visit (HOSPITAL_COMMUNITY)
Admission: RE | Admit: 2015-04-10 | Discharge: 2015-04-10 | Disposition: A | Discharge status: Home / Self Care | Payer: Medicaid Other | Source: Ambulatory Visit | Attending: Family Medicine | Admitting: Family Medicine

## 2015-04-10 ENCOUNTER — Ambulatory Visit (INDEPENDENT_AMBULATORY_CARE_PROVIDER_SITE_OTHER): Payer: Self-pay | Admitting: Family Medicine

## 2015-04-10 ENCOUNTER — Encounter: Payer: Self-pay | Admitting: Family Medicine

## 2015-04-10 VITALS — BP 160/81 | HR 60 | Temp 98.2°F | Ht 63.0 in | Wt 143.0 lb

## 2015-04-10 DIAGNOSIS — Z1151 Encounter for screening for human papillomavirus (HPV): Secondary | ICD-10-CM | POA: Diagnosis present

## 2015-04-10 DIAGNOSIS — Z01419 Encounter for gynecological examination (general) (routine) without abnormal findings: Secondary | ICD-10-CM | POA: Insufficient documentation

## 2015-04-10 DIAGNOSIS — A599 Trichomoniasis, unspecified: Secondary | ICD-10-CM | POA: Insufficient documentation

## 2015-04-10 DIAGNOSIS — Z0000 Encounter for general adult medical examination without abnormal findings: Secondary | ICD-10-CM

## 2015-04-10 DIAGNOSIS — N920 Excessive and frequent menstruation with regular cycle: Secondary | ICD-10-CM

## 2015-04-10 DIAGNOSIS — N946 Dysmenorrhea, unspecified: Secondary | ICD-10-CM | POA: Insufficient documentation

## 2015-04-10 MED ORDER — IBUPROFEN 800 MG PO TABS: 800.0000 mg | ORAL_TABLET | Freq: Three times a day (TID) | ORAL | Status: DC | PRN

## 2015-04-10 MED ORDER — METRONIDAZOLE 500 MG PO TABS: 2000.0000 mg | ORAL_TABLET | Freq: Once | ORAL | Status: DC

## 2015-04-10 NOTE — Assessment & Plan Note (Signed)
Pap test performed today with HPV cotesting.

## 2015-04-10 NOTE — Assessment & Plan Note (Signed)
Patient not treated. Will give 2g of Flagyl in single dose. Recommended patient to discuss results with all sexual partners within the last 3 months, or her last sexual partner if longer than 3 months ago.

## 2015-04-10 NOTE — Patient Instructions (Addendum)
Thank you for coming to the clinic today. It was nice seeing you.  We performed your pap smear today. You should hear about the result by early next week.  We will treat you with a single dose of Flagyl for your infection.  I will prescribe you high dose ibuprofen for your painful periods.   For your excessive bleeding, the depot shot you received will cover you for 3 months. We can repeat this shot in 3 months or start you on an oral medication.  Please come back in 3 months for follow up.

## 2015-04-10 NOTE — Progress Notes (Signed)
    Subjective:  Ariana Jensen is a 49 y.o. female who presents to the [facility] today for pap testing, also with complaints of vaginal bleeding and dysmennorhea.   HPI: Vaginal Bleeding Patient reports that she has had heavy periods her "entire life." States that she usually has her periods every month, however went the last 2 months without a period until starting 3 weeks ago. Reports that the bleeding was very heavy and that she was passing blood clots every 15 minutes for 3-4 days. She became very weak and presented to the ED. In the ED, patient underwent US which was negative. Also had a negative pregnancy test. Patient was given a shot of depot, which she states did not really help the bleeding, however her bleeding slowly subsided over the next 3-4 days.   Dysmennorhea Patient also reports having painful periods her entire life. States that the pain can be so severe that she has "lost jobs" due to staying out of work. Has never tried anything for the pain. Had previously tried having an IUD placed, but was unable to tolerate the procedure.   Trichimoniasis. Patient's wet prep done 8 days ago in ED was found to have trichomonas. Patient states that she was not treated. Denies sexual activity for several months.   Tobacco Abuse Patient continues to smoke about a pack per day and is concerned about starting birth control for AUB.   Objective:  Physical Exam: BP 160/81 mmHg  Pulse 60  Temp(Src) 98.2 F (36.8 C) (Oral)  Ht 5\' 3"  (1.6 m)  Wt 143 lb (64.864 kg)  BMI 25.34 kg/m2  LMP 03/22/2015 (Approximate)  Gen: NAD, resting comfortably CV: RRR with no murmurs appreciated Lungs: NWOB, CTAB with no crackles, wheezes, or rhonchi GI: Normal bowel sounds present. Soft, Nontender, Nondistended. GU: External genitalia normal. Vaginal vault and cervix without lesions. Scant amount of white discharge noted.  MSK: no edema, cyanosis, or clubbing noted Skin: warm, dry Neuro: grossly normal,  moves all extremities Psych: Normal affect and thought content  No results found for this or any previous visit (from the past 72 hour(s)).   Assessment/Plan:  Menorrhagia Normal TVUS in ED. Patient given depo shot in ED. Patient has tried mirena in the past and is not interested in re-trying. Discussed continue depo shots vs using progestin only pills. Patient said that she would think about her options and discuss more at further appointment.   Dysmenorrhea Will treat with high dose ibuprofen. Also anticipate improvement with depo or progestin only pills, as noted above.   Trichimoniasis Patient not treated. Will give 2g of Flagyl in single dose. Recommended patient to discuss results with all sexual partners within the last 3 months, or her last sexual partner if longer than 3 months ago.   Healthcare maintenance Pap test performed today with HPV cotesting.     Katina Degreealeb M. Jimmey RalphParker, MD [facility] Resident PGY-2 04/10/2015 4:50 PM

## 2015-04-10 NOTE — Assessment & Plan Note (Signed)
Normal TVUS in ED. Patient given depo shot in ED. Patient has tried mirena in the past and is not interested in re-trying. Discussed continue depo shots vs using progestin only pills. Patient said that she would think about her options and discuss more at further appointment.

## 2015-04-10 NOTE — Assessment & Plan Note (Signed)
Will treat with high dose ibuprofen. Also anticipate improvement with depo or progestin only pills, as noted above.

## 2015-04-12 LAB — CYTOLOGY - PAP

## 2015-04-15 ENCOUNTER — Encounter: Payer: Self-pay | Admitting: Family Medicine

## 2015-04-22 ENCOUNTER — Telehealth: Payer: Self-pay | Admitting: Family Medicine

## 2015-04-22 DIAGNOSIS — T85848D Pain due to other internal prosthetic devices, implants and grafts, subsequent encounter: Secondary | ICD-10-CM

## 2015-04-22 NOTE — Telephone Encounter (Signed)
Pt needs a referral to see a dentist. Ariana Jensen

## 2015-04-22 NOTE — Telephone Encounter (Signed)
Will forward to MD.  Patient has the orange card and this requires a referral from our office. Ariana Jensen,CMA

## 2015-04-23 NOTE — Telephone Encounter (Signed)
Referral placed.   Ariana Jensen. [name], MD [facility] Resident PGY-2 04/23/2015 9:01 AM

## 2015-05-13 ENCOUNTER — Ambulatory Visit (INDEPENDENT_AMBULATORY_CARE_PROVIDER_SITE_OTHER): Payer: No Typology Code available for payment source | Admitting: Internal Medicine

## 2015-05-13 VITALS — BP 148/84 | HR 52 | Ht 63.0 in | Wt 141.3 lb

## 2015-05-13 DIAGNOSIS — R079 Chest pain, unspecified: Secondary | ICD-10-CM

## 2015-05-13 DIAGNOSIS — B182 Chronic viral hepatitis C: Secondary | ICD-10-CM

## 2015-05-13 DIAGNOSIS — F1910 Other psychoactive substance abuse, uncomplicated: Secondary | ICD-10-CM

## 2015-05-13 DIAGNOSIS — I10 Essential (primary) hypertension: Secondary | ICD-10-CM

## 2015-05-13 NOTE — Patient Instructions (Signed)
Your physician recommends that you schedule a follow-up appointment in: 1 MONTHS WITH DR HILTY  Your physician has requested that you have en exercise stress myoview. For further information please visit [email or web address] Please follow instruction sheet, as given.   AVOID DRUGS THAT PROLONG QT

## 2015-05-14 ENCOUNTER — Encounter: Payer: Self-pay | Admitting: Internal Medicine

## 2015-05-14 NOTE — Progress Notes (Signed)
OFFICE NOTE  Chief Complaint:  Chest pain  Primary Care Physician: Jacquiline Doe, MD  HPI:  Ariana Jensen is a pleasant 49 year old female is currently referred to me for evaluation of chest pain. She is reported over the past several weeks she's had episodes of chest pain that is mostly noted in the morning. Sometimes it's for short period of time perhaps less than a minute but other times and last more than 30 minutes. Is oftentimes associated with exertion but could be present at rest. Also she feels short of breath at times when she hasn't been very active. She reported some mild swelling in her feet as well. She is a history of hepatitis C and currently has active viremia. She also has a history of IV drug abuse and is currently on maintenance doses of methadone. She also takes blood pressure medication for hypertension. EKG is notable for sinus bradycardia with a prolonged QT interval at 494 ms. This may be related to methadone. Family history is also concerning by the fact that both parents died before age 2 of heart attacks and her father's grandmother and grandfather both had MIs and died in their 26s.  PMHx:  Past Medical History  Diagnosis Date  . Tuberculosis     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 age 60, now s/p 1-yr of multidrug treatment (dates unknown)  . Hypertension   . Hepatitis C antibody test positive   . ASCUS (atypical squamous cells of undetermined significance) on Pap smear   . Opioid dependence   . Trichomonas 04/17/2011    Diagnosed 04/02/11 during hospitalization, treated with Flagyl 2g, patient instructed to have partner treated (must follow-up)     Past Surgical History  Procedure Laterality Date  . Cesarean section    . Left salpingectomy  March 2010    ectopic pregnancy  . Dilation and curettage of uterus    . I&d extremity  10/18/2011    Procedure: IRRIGATION AND DEBRIDEMENT EXTREMITY;  Surgeon: Sharma Covert, MD;  Location: [facility] OR;  Service:  Orthopedics;  Laterality: Left;  . I&d extremity  10/21/2011    Procedure: IRRIGATION AND DEBRIDEMENT EXTREMITY;  Surgeon: Sharma Covert, MD;  Location: [facility] OR;  Service: Orthopedics;  Laterality: Left;    FAMHx:  Family History  Problem Relation Age of Onset  . Heart attack Mother   . Heart attack Father   . Heart attack Paternal Grandmother   . Heart attack Paternal Grandfather     SOCHx:   reports that she has been smoking.  She has never used smokeless tobacco. She reports that she does not drink alcohol or use illicit drugs.  previous IV drug abuser. She is on chronic methadone  ALLERGIES:  No Known Allergies  ROS: A comprehensive review of systems was negative except for: Cardiovascular: positive for chest pain  HOME MEDS: Current Outpatient Prescriptions  Medication Sig Dispense Refill  . Aspirin-Acetaminophen (GOODY BODY PAIN) 500-325 MG PACK Take 1 packet by mouth every 8 (eight) hours as needed (pain).    Marland Kitchen esomeprazole (NEXIUM) 40 MG capsule Take 1 capsule (40 mg total) by mouth daily before breakfast. 90 capsule 3  . hydrochlorothiazide (HYDRODIURIL) 25 MG tablet TAKE ONE TABLET BY MOUTH ONCE DAILY 90 tablet 0  . HYDROcodone-acetaminophen (NORCO/VICODIN) 5-325 MG per tablet Take 1 tablet by mouth every 6 (six) hours as needed for moderate pain. 15 tablet 0  . ibuprofen (ADVIL,MOTRIN) 800 MG tablet Take 1 tablet (800 mg total) by mouth  every 8 (eight) hours as needed. 30 tablet 0  . lisinopril (PRINIVIL,ZESTRIL) 10 MG tablet Take 0.5 tablets (5 mg total) by mouth daily. (Patient taking differently: Take 10 mg by mouth daily. ) 90 tablet 0  . methadone (DOLOPHINE) 5 MG/5ML solution Take 50 mg by mouth every morning.      Current Facility-Administered Medications  Medication Dose Route Frequency Provider Last Rate Last Dose  . hepatitis A virus (PF) vaccine (HAVRIX (PF)) 1440 EL U/ML injection 1,440 Units  1 mL Intramuscular Once Shelva Majestic, MD         LABS/IMAGING: No results found for this or any previous visit (from the past 48 hour(s)). No results found.  WEIGHTS: Wt Readings from Last 3 Encounters:  05/13/15 141 lb 4.8 oz (64.093 kg)  04/10/15 143 lb (64.864 kg)  04/02/15 142 lb (64.411 kg)    VITALS: BP 148/84 mmHg  Pulse 52  Ht  (1.6 m)  Wt 141 lb 4.8 oz (64.093 kg)  BMI 25.04 kg/m2  EXAM: General appearance: alert and no distress Neck: no carotid bruit, no JVD and thyroid not enlarged, symmetric, no tenderness/mass/nodules Lungs: clear to auscultation bilaterally Heart: regular rate and rhythm, S1, S2 normal, no murmur, click, rub or gallop Abdomen: soft, non-tender; bowel sounds normal; no masses,  no organomegaly Extremities: extremities normal, atraumatic, no cyanosis or edema Pulses: 2+ and symmetric Skin: Skin color, texture, turgor normal. No rashes or lesions Neurologic: Grossly normal Psych: Pleasant  EKG: Sinus bradycardia 52, prolonged QTC of 494 ms  ASSESSMENT: 1. Chest pain with a strong family history of coronary disease 2. Hypertension 3. Chronic hepatitis C 4. History of IV drug abuse 5. 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   Mrs. Bobeck is having chest pain with a number of cardiovascular risk factors for which I have at least intermediate pretest probability of coronary disease. I would recommend a nuclear stress test to further evaluate this. In addition, this or be helpful to evaluate for coronary disease as she may be a candidate for future treatment of her hepatitis C. I'll defer this to her primary care provider however given the success of recent medications for the eradication of hep C, this should be strongly considered. I would also like to note that her QTC is elongated on her EKG, which is likely related to methadone. She tells me she's tried to be weaning down her dose is much as possible. She needs to be careful about combining methadone with other QT  prolonging drugs as there is increased risk for torsades de pointes and other life-threatening arrhythmias.  Plan to see her back to discuss results of her stress test in a few weeks. Thanks as always for the kind referral.  Chrystie Nose, MD, [facility] Attending Cardiologist [facility]  Chrystie Nose 05/14/2015, 8:26 PM

## 2015-05-15 ENCOUNTER — Encounter: Payer: Self-pay | Admitting: *Deleted

## 2015-05-28 ENCOUNTER — Telehealth (HOSPITAL_COMMUNITY): Payer: Self-pay

## 2015-05-28 NOTE — Telephone Encounter (Signed)
Encounter complete. 

## 2015-05-29 ENCOUNTER — Telehealth (HOSPITAL_COMMUNITY): Payer: Self-pay

## 2015-05-29 NOTE — Telephone Encounter (Signed)
Encounter complete. 

## 2015-05-30 ENCOUNTER — Inpatient Hospital Stay (HOSPITAL_COMMUNITY): Admission: RE | Admit: 2015-05-30 | Payer: No Typology Code available for payment source | Source: Ambulatory Visit

## 2015-05-31 ENCOUNTER — Telehealth (HOSPITAL_COMMUNITY): Payer: Self-pay

## 2015-05-31 NOTE — Telephone Encounter (Signed)
Encounter complete. 

## 2015-06-05 ENCOUNTER — Inpatient Hospital Stay (HOSPITAL_COMMUNITY): Admission: RE | Admit: 2015-06-05 | Payer: No Typology Code available for payment source | Source: Ambulatory Visit

## 2015-06-07 ENCOUNTER — Encounter (HOSPITAL_COMMUNITY): Payer: No Typology Code available for payment source

## 2015-06-10 ENCOUNTER — Telehealth (HOSPITAL_COMMUNITY): Payer: Self-pay | Admitting: *Deleted

## 2015-06-18 ENCOUNTER — Ambulatory Visit: Payer: No Typology Code available for payment source | Admitting: Internal Medicine

## 2015-06-18 ENCOUNTER — Telehealth (HOSPITAL_COMMUNITY): Payer: Self-pay

## 2015-06-18 NOTE — Telephone Encounter (Signed)
Encounter complete. 

## 2015-06-19 ENCOUNTER — Telehealth (HOSPITAL_COMMUNITY): Payer: Self-pay

## 2015-06-19 NOTE — Telephone Encounter (Signed)
Encounter complete. 

## 2015-06-20 ENCOUNTER — Encounter (HOSPITAL_COMMUNITY): Payer: Self-pay | Admitting: *Deleted

## 2015-06-20 ENCOUNTER — Ambulatory Visit (HOSPITAL_COMMUNITY)
Admission: RE | Admit: 2015-06-20 | Discharge: 2015-06-20 | Disposition: A | Discharge status: Home / Self Care | Payer: No Typology Code available for payment source | Source: Ambulatory Visit | Attending: Cardiovascular Disease | Admitting: Cardiovascular Disease

## 2015-06-20 DIAGNOSIS — R079 Chest pain, unspecified: Secondary | ICD-10-CM

## 2015-06-20 DIAGNOSIS — R42 Dizziness and giddiness: Secondary | ICD-10-CM | POA: Insufficient documentation

## 2015-06-20 DIAGNOSIS — R0602 Shortness of breath: Secondary | ICD-10-CM | POA: Insufficient documentation

## 2015-06-20 DIAGNOSIS — R9439 Abnormal result of other cardiovascular function study: Secondary | ICD-10-CM | POA: Insufficient documentation

## 2015-06-20 DIAGNOSIS — R5383 Other fatigue: Secondary | ICD-10-CM | POA: Insufficient documentation

## 2015-06-20 DIAGNOSIS — I517 Cardiomegaly: Secondary | ICD-10-CM | POA: Insufficient documentation

## 2015-06-20 DIAGNOSIS — B182 Chronic viral hepatitis C: Secondary | ICD-10-CM

## 2015-06-20 DIAGNOSIS — R002 Palpitations: Secondary | ICD-10-CM | POA: Insufficient documentation

## 2015-06-20 DIAGNOSIS — F1910 Other psychoactive substance abuse, uncomplicated: Secondary | ICD-10-CM

## 2015-06-20 DIAGNOSIS — I10 Essential (primary) hypertension: Secondary | ICD-10-CM

## 2015-06-20 LAB — MYOCARDIAL PERFUSION IMAGING
CHL CUP NUCLEAR SDS: 2
CHL CUP NUCLEAR SRS: 5
CHL CUP NUCLEAR SSS: 7
LV dias vol: 139 mL
LV sys vol: 75 mL
Peak HR: 74 {beats}/min
Rest HR: 44 {beats}/min
TID: 1.17

## 2015-06-20 MED ORDER — REGADENOSON 0.4 MG/5ML IV SOLN
0.4000 mg | Freq: Once | INTRAVENOUS | Status: AC
  Administered 2015-06-20: 0.4 mg via INTRAVENOUS

## 2015-06-20 MED ORDER — AMINOPHYLLINE 25 MG/ML IV SOLN
75.0000 mg | Freq: Once | INTRAVENOUS | Status: AC
  Administered 2015-06-20: 75 mg via INTRAVENOUS

## 2015-06-20 MED ORDER — TECHNETIUM TC 99M SESTAMIBI GENERIC - CARDIOLITE
10.2000 | Freq: Once | INTRAVENOUS | Status: AC | PRN
  Administered 2015-06-20: 10.2 via INTRAVENOUS

## 2015-06-20 MED ORDER — TECHNETIUM TC 99M SESTAMIBI GENERIC - CARDIOLITE
30.5000 | Freq: Once | INTRAVENOUS | Status: AC | PRN
  Administered 2015-06-20: 30.5 via INTRAVENOUS

## 2015-06-20 NOTE — Progress Notes (Unsigned)
Patient was changed from Stress MPI to a Lexiscan due to Blood pressure and meds were taken this am.

## 2015-06-24 ENCOUNTER — Other Ambulatory Visit: Payer: Self-pay | Admitting: Family Medicine

## 2015-06-24 NOTE — Telephone Encounter (Signed)
Rx filled.  Katina Degree. [name], MD [facility] Resident PGY-2 06/24/2015 12:02 PM

## 2015-06-27 ENCOUNTER — Ambulatory Visit: Payer: No Typology Code available for payment source | Admitting: Internal Medicine

## 2015-07-02 ENCOUNTER — Other Ambulatory Visit: Payer: Self-pay | Admitting: *Deleted

## 2015-07-02 ENCOUNTER — Telehealth (HOSPITAL_COMMUNITY): Payer: Self-pay | Admitting: *Deleted

## 2015-07-02 DIAGNOSIS — R9439 Abnormal result of other cardiovascular function study: Secondary | ICD-10-CM

## 2015-07-02 DIAGNOSIS — I519 Heart disease, unspecified: Secondary | ICD-10-CM

## 2015-07-15 ENCOUNTER — Ambulatory Visit (HOSPITAL_COMMUNITY): Payer: No Typology Code available for payment source | Attending: Internal Medicine

## 2015-07-15 ENCOUNTER — Other Ambulatory Visit: Payer: Self-pay

## 2015-07-15 DIAGNOSIS — I10 Essential (primary) hypertension: Secondary | ICD-10-CM | POA: Insufficient documentation

## 2015-07-15 DIAGNOSIS — R9439 Abnormal result of other cardiovascular function study: Secondary | ICD-10-CM

## 2015-07-15 DIAGNOSIS — R931 Abnormal findings on diagnostic imaging of heart and coronary circulation: Secondary | ICD-10-CM

## 2015-07-15 DIAGNOSIS — I5189 Other ill-defined heart diseases: Secondary | ICD-10-CM | POA: Insufficient documentation

## 2015-07-15 DIAGNOSIS — I517 Cardiomegaly: Secondary | ICD-10-CM | POA: Insufficient documentation

## 2015-07-15 DIAGNOSIS — I519 Heart disease, unspecified: Secondary | ICD-10-CM

## 2015-07-15 DIAGNOSIS — F17200 Nicotine dependence, unspecified, uncomplicated: Secondary | ICD-10-CM | POA: Insufficient documentation

## 2015-07-18 ENCOUNTER — Encounter: Payer: Self-pay | Admitting: Internal Medicine

## 2015-07-18 ENCOUNTER — Ambulatory Visit (INDEPENDENT_AMBULATORY_CARE_PROVIDER_SITE_OTHER): Payer: No Typology Code available for payment source | Admitting: Internal Medicine

## 2015-07-18 VITALS — BP 138/88 | HR 68 | Ht 63.0 in | Wt 147.9 lb

## 2015-07-18 DIAGNOSIS — I4581 Long QT syndrome: Secondary | ICD-10-CM

## 2015-07-18 DIAGNOSIS — Z720 Tobacco use: Secondary | ICD-10-CM

## 2015-07-18 DIAGNOSIS — I209 Angina pectoris, unspecified: Secondary | ICD-10-CM

## 2015-07-18 DIAGNOSIS — R9431 Abnormal electrocardiogram [ECG] [EKG]: Secondary | ICD-10-CM | POA: Insufficient documentation

## 2015-07-18 NOTE — Progress Notes (Signed)
OFFICE NOTE  Chief Complaint:  Follow-up stress test and echocardiogram  Primary Care Physician: Jacquiline Doealeb Parker, MD  HPI:  Ariana Jensen is a pleasant 49 year old female is currently referred to me for evaluation of chest pain. She is reported over the past several weeks she's had episodes of chest pain that is mostly noted in the morning. Sometimes it's for short period of time perhaps less than a minute but other times and last more than 30 minutes. Is oftentimes associated with exertion but could be present at rest. Also she feels short of breath at times when she hasn't been very active. She reported some mild swelling in her feet as well. She is a history of hepatitis C and currently has active viremia. She also has a history of IV drug abuse and is currently on maintenance doses of methadone. She also takes blood pressure medication for hypertension. EKG is notable for sinus bradycardia with a prolonged QT interval at 494 ms. This may be related to methadone. Family history is also concerning by the fact that both parents died before age 48 of heart attacks and her father's grandmother and grandfather both had MIs and died in their 6450s.  [name] returns today for follow-up. She underwent a nuclear stress test which was intermediate risk due to mildly reduced LV function and EF of 46% with inferior hypokinesis and a fixed inferior defect. This is suggestive of possible scar with no reversible ischemia. A follow-up echocardiogram however demonstrated normal inferior wall motion and normal LV function with EF of 55-60%, therefore the inferior defect is likely an imaging artifact. She now reports her chest pain symptoms have improved somewhat. She continues to have some episodes which are constant, dull and not associated with exertion or relieved by rest. The symptoms continue sound atypical. From a cardiac standpoint and feel that she could safely undergo treatment to try to eradicate her  hepatitis C. Also, of note she has had a prolonged QTC in the past. A repeat EKG shows some improvement in that with a QT interval of 487 ms.  PMHx:  Past Medical History  Diagnosis Date  . Tuberculosis     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 age 49, now s/p 1-yr of multidrug treatment (dates unknown)  . Hypertension   . Hepatitis C antibody test positive   . ASCUS (atypical squamous cells of undetermined significance) on Pap smear   . Opioid dependence (HCC)   . Trichomonas 04/17/2011    Diagnosed 04/02/11 during hospitalization, treated with Flagyl 2g, patient instructed to have partner treated (must follow-up)     Past Surgical History  Procedure Laterality Date  . Cesarean section  1984  . Salpingectomy Left March 2010    ectopic pregnancy  . Dilation and curettage of uterus    . I&d extremity  10/18/2011    Procedure: IRRIGATION AND DEBRIDEMENT EXTREMITY;  Surgeon: Sharma CovertFred W Ortmann, MD;  [facility];  Service: Orthopedics;  Laterality: Left;  . I&d extremity  10/21/2011    Procedure: IRRIGATION AND DEBRIDEMENT EXTREMITY;  Surgeon: Sharma CovertFred W Ortmann, MD;  Location: [facility] OR;  Service: Orthopedics;  Laterality: Left;    FAMHx:  Family History  Problem Relation Age of Onset  . Heart attack Mother   . Heart attack Father   . Heart attack Paternal Grandmother   . Heart attack Paternal Grandfather   . Cirrhosis Brother     SOCHx:   reports that she has been smoking.  She has never  used smokeless tobacco. She reports that she does not drink alcohol or use illicit drugs.  previous IV drug abuser. She is on chronic methadone  ALLERGIES:  No Known Allergies  ROS: A comprehensive review of systems was negative except for: Cardiovascular: positive for chest pain  HOME MEDS: Current Outpatient Prescriptions  Medication Sig Dispense Refill  . Aspirin-Acetaminophen (GOODY BODY PAIN) 500-325 MG PACK Take 1 packet by mouth every 8 (eight) hours as needed (pain).    Marland Kitchen esomeprazole  (NEXIUM) 40 MG capsule Take 1 capsule (40 mg total) by mouth daily before breakfast. 90 capsule 3  . hydrochlorothiazide (HYDRODIURIL) 25 MG tablet TAKE ONE TABLET BY MOUTH ONCE DAILY 90 tablet 0  . HYDROcodone-acetaminophen (NORCO/VICODIN) 5-325 MG per tablet Take 1 tablet by mouth every 6 (six) hours as needed for moderate pain. 15 tablet 0  . ibuprofen (ADVIL,MOTRIN) 800 MG tablet TAKE ONE TABLET BY MOUTH EVERY 8 HOURS AS NEEDED 30 tablet 0  . lisinopril (PRINIVIL,ZESTRIL) 10 MG tablet Take 0.5 tablets (5 mg total) by mouth daily. (Patient taking differently: Take 10 mg by mouth daily. ) 90 tablet 0  . methadone (DOLOPHINE) 5 MG/5ML solution Take 50 mg by mouth every morning.      Current Facility-Administered Medications  Medication Dose Route Frequency Provider Last Rate Last Dose  . hepatitis A virus (PF) vaccine (HAVRIX (PF)) 1440 EL U/ML injection 1,440 Units  1 mL Intramuscular Once Shelva Majestic, MD        LABS/IMAGING: No results found for this or any previous visit (from the past 48 hour(s)). No results found.  WEIGHTS: Wt Readings from Last 3 Encounters:  07/18/15 147 lb 14.4 oz (67.087 kg)  06/20/15 141 lb (63.957 kg)  05/13/15 141 lb 4.8 oz (64.093 kg)    VITALS: BP 138/88 mmHg  Pulse 68  Ht  (1.6 m)  Wt 147 lb 14.4 oz (67.087 kg)  BMI 26.21 kg/m2  SpO2 98%  EXAM: Deferred  EKG: Normal sinus rhythm at 62, mild LVH, prolonged QTC at 4 and 87 ms  ASSESSMENT: 1. Chest pain with a strong family history of coronary disease - intermediate risk stress test with an inferior attenuation artifact, normal LV EF by echo and normal wall motion (suspect false positive stress test) 2. Hypertension 3. Chronic hepatitis C 4. History of IV drug abuse 5. 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   [name] has an atypical, chronic chest pain syndrome which I do not feel is cardiac. Her stress test was mildly abnormal however a repeat echocardiogram 1  week later showed normal function and normal wall motion. There is no evidence of scar and there was no reversible ischemia. I do feel like this was an inferior attenuation artifact. I recommend treatment for hepatitis C to reduce her cardiovascular risk factors. Also given her family history of coronary disease, she should have aggressive risk factor modification. I'm happy to see her back on an as-needed basis.  Chrystie Nose, MD, [facility] Attending Cardiologist [facility]  Lisette Abu Hilty 07/18/2015, 9:05 AM

## 2015-07-18 NOTE — Patient Instructions (Signed)
Your physician recommends that you schedule a follow-up appointment as needed  

## 2015-08-05 ENCOUNTER — Ambulatory Visit (INDEPENDENT_AMBULATORY_CARE_PROVIDER_SITE_OTHER): Payer: Self-pay | Admitting: Student

## 2015-08-05 ENCOUNTER — Encounter: Payer: Self-pay | Admitting: Student

## 2015-08-05 VITALS — BP 166/97 | HR 92 | Temp 98.4°F | Wt 148.7 lb

## 2015-08-05 DIAGNOSIS — B851 Pediculosis due to Pediculus humanus corporis: Secondary | ICD-10-CM

## 2015-08-05 DIAGNOSIS — Z23 Encounter for immunization: Secondary | ICD-10-CM

## 2015-08-05 HISTORY — DX: Pediculosis due to pediculus humanus corporis: B85.1

## 2015-08-05 MED ORDER — PERMETHRIN 1 % EX LOTN: 1.0000 "application " | TOPICAL_LOTION | Freq: Once | CUTANEOUS | Status: DC

## 2015-08-05 MED ORDER — HYDROXYZINE HCL 10 MG PO TABS: 10.0000 mg | ORAL_TABLET | Freq: Three times a day (TID) | ORAL | Status: DC | PRN

## 2015-08-05 NOTE — Assessment & Plan Note (Signed)
Rash with concern for body lice - Will treat with topical permethrin with repeat in 1 week if no improvement - Given precautions to wash all bed linens and clothes is hot water after treating - hydroxyzine for pruritis

## 2015-08-05 NOTE — Patient Instructions (Addendum)
Return to clinic in 2 weeks Administer permethrin cream to whole body then wash off after 10 minutes Repeat in 1 week if no improvement You may take hodroxyzine to help with itching

## 2015-08-05 NOTE — Progress Notes (Signed)
   Subjective:    Patient ID: Ariana Jensen, female    DOB: October 27, 1965, 49 y.o.   MRN: [id number]   CC: Concern for lice  HPI  48 present with body itching and rash for 1 month  Rash/itching - Itchign over arms, legs and back for 1 month - She also noted some small red bumps on her legs, arms and back - she found a clear [facility] this [facility] a nurse at the front desk  - She was told that this was a body louse - She then threw the bug away - After being told this she left the ED without checking in and seeing a Doctor to  clean her boarding house room - Denies fevers, chills, scalp itching   Review of Systems   See HPI for ROS.   Past Medical History  Diagnosis Date  . Tuberculosis     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 age 49, now s/p 1-yr of multidrug treatment (dates unknown)  . Hypertension   . Hepatitis C antibody test positive   . ASCUS (atypical squamous cells of undetermined significance) on Pap smear   . Opioid dependence (HCC)   . Trichomonas 04/17/2011    Diagnosed 04/02/11 during hospitalization, treated with Flagyl 2g, patient instructed to have partner treated (must follow-up)    Past Surgical History  Procedure Laterality Date  . Cesarean section  1984  . Salpingectomy Left March 2010    ectopic pregnancy  . Dilation and curettage of uterus    . I&d extremity  10/18/2011    Procedure: IRRIGATION AND DEBRIDEMENT EXTREMITY;  Surgeon: Sharma CovertFred W Ortmann, MD;  [facility];  Service: Orthopedics;  Laterality: Left;  . I&d extremity  10/21/2011    Procedure: IRRIGATION AND DEBRIDEMENT EXTREMITY;  Surgeon: Sharma CovertFred W Ortmann, MD;  Location: [facility] OR;  Service: Orthopedics;  Laterality: Left;   OB History    No data available     Social History   Social History  . Marital Status: Married    Spouse Name: N/A  . Number of Children: 1  . Years of Education: 7th grade   Occupational History  . Not on file.   Social  History Main Topics  . Smoking status: Current Every Day Smoker -- 1.00 packs/day for 20 years  . Smokeless tobacco: Never Used  . Alcohol Use: No  . Drug Use: No  . Sexual Activity: Yes    Birth Control/ Protection: None   Other Topics Concern  . Not on file   Social History Narrative   [place]   Currently Unemployeed    Objective:  BP 166/97 mmHg  Pulse 92  Temp(Src) 98.4 F (36.9 C) (Oral)  Wt 148 lb 11.2 oz (67.45 kg) Vitals and nursing note reviewed  General: NAD Cardio/pulm: normal WOB Skin: sparse, erythematous papular lesions noted over back, legs and arms, Excoriations noted over arms and legs Neuro: alert and oriented, no focal deficits   Assessment & Plan:    Body lice Rash with concern for body lice - Will treat with topical permethrin with repeat in 1 week if no improvement - Given precautions to wash all bed linens and clothes is hot water after treating - hydroxyzine for pruritis      Alyssa A. Kennon RoundsHaney MD, MS Family Medicine Resident PGY-1 Pager [phone]

## 2015-08-08 ENCOUNTER — Encounter (HOSPITAL_COMMUNITY): Payer: Self-pay

## 2015-08-08 ENCOUNTER — Emergency Department (HOSPITAL_COMMUNITY)
Admission: EM | Admit: 2015-08-08 | Discharge: 2015-08-08 | Discharge status: Against Medical Advice | Payer: No Typology Code available for payment source | Attending: Emergency Medicine | Admitting: Emergency Medicine

## 2015-08-08 DIAGNOSIS — L299 Pruritus, unspecified: Secondary | ICD-10-CM | POA: Insufficient documentation

## 2015-08-08 DIAGNOSIS — Z8611 Personal history of tuberculosis: Secondary | ICD-10-CM | POA: Insufficient documentation

## 2015-08-08 DIAGNOSIS — Z8619 Personal history of other infectious and parasitic diseases: Secondary | ICD-10-CM | POA: Insufficient documentation

## 2015-08-08 DIAGNOSIS — F17200 Nicotine dependence, unspecified, uncomplicated: Secondary | ICD-10-CM | POA: Insufficient documentation

## 2015-08-08 DIAGNOSIS — I10 Essential (primary) hypertension: Secondary | ICD-10-CM | POA: Insufficient documentation

## 2015-08-08 DIAGNOSIS — Z79891 Long term (current) use of opiate analgesic: Secondary | ICD-10-CM | POA: Insufficient documentation

## 2015-08-08 DIAGNOSIS — Z79899 Other long term (current) drug therapy: Secondary | ICD-10-CM | POA: Insufficient documentation

## 2015-08-08 NOTE — ED Notes (Signed)
Pa into room. patient walked out without signing out or checking in with RN. The patient appeared upset and loudly states "how do I get out of here"

## 2015-08-08 NOTE — ED Provider Notes (Signed)
CSN: [id number]     Arrival date & time 08/08/15  2023 History  By signing my name below, I, Doreatha Martin, attest that this documentation has been prepared under the direction and in the presence of Sanaai Doane, PA-C.  Electronically Signed: Doreatha Martin, ED Scribe. 08/08/2015. 8:59 PM.    Chief Complaint  Patient presents with  . Rash   The history is provided by the patient. No language interpreter was used.    HPI Comments: Ariana Jensen is a 49 y.o. female who presents to the Emergency Department complaining of a moderate, intermittent rash to the extremities and torso for 3 weeks. Pt states that she is being treated by her PCP with permethrine and  Hydroxyzine for possible lice. She states little to no relief with this treatment. Pt is adamant that there are bugs on her and under her skin. Pt states she has dogs at home and treated them for fleas when she noticed the itching. She also notes that she has cleaned all her linens and is sleeping on a new air mattress. Pt reports that she also had her home checked for bed bugs. She denies fever.    Past Medical History  Diagnosis Date  . Tuberculosis     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 age 27, now s/p 1-yr of multidrug treatment (dates unknown)  . Hypertension   . Hepatitis C antibody test positive   . ASCUS (atypical squamous cells of undetermined significance) on Pap smear   . Opioid dependence (HCC)   . Trichomonas 04/17/2011    Diagnosed 04/02/11 during hospitalization, treated with Flagyl 2g, patient instructed to have partner treated (must follow-up)    Past Surgical History  Procedure Laterality Date  . Cesarean section  1984  . Salpingectomy Left March 2010    ectopic pregnancy  . Dilation and curettage of uterus    . I&d extremity  10/18/2011    Procedure: IRRIGATION AND DEBRIDEMENT EXTREMITY;  Surgeon: Sharma Covert, MD;  Location: [facility] OR;  Service: Orthopedics;  Laterality: Left;  . I&d extremity  10/21/2011     Procedure: IRRIGATION AND DEBRIDEMENT EXTREMITY;  Surgeon: Sharma Covert, MD;  Location: [facility] OR;  Service: Orthopedics;  Laterality: Left;   Family History  Problem Relation Age of Onset  . Heart attack Mother   . Heart attack Father   . Heart attack Paternal Grandmother   . Heart attack Paternal Grandfather   . Cirrhosis Brother    Social History  Substance Use Topics  . Smoking status: Current Every Day Smoker -- 1.00 packs/day for 20 years  . Smokeless tobacco: Never Used  . Alcohol Use: No   OB History    No data available     Review of Systems  Constitutional: Negative for fever.  Skin: Positive for rash.   Allergies  Review of patient's allergies indicates no known allergies.  Home Medications   Prior to Admission medications   Medication Sig Start Date End Date Taking? Authorizing Provider  Aspirin-Acetaminophen (GOODY BODY PAIN) 500-325 MG PACK Take 1 packet by mouth every 8 (eight) hours as needed (pain).    Historical Provider, MD  esomeprazole (NEXIUM) 40 MG capsule Take 1 capsule (40 mg total) by mouth daily before breakfast. 06/25/14   Ardith Dark, MD  hydrochlorothiazide (HYDRODIURIL) 25 MG tablet TAKE ONE TABLET BY MOUTH ONCE DAILY 04/10/15   Ardith Dark, MD  HYDROcodone-acetaminophen (NORCO/VICODIN) 5-325 MG per tablet Take 1 tablet by mouth every 6 (six) hours  as needed for moderate pain. 09/23/14   Charlestine Nighthristopher Lawyer, PA-C  hydrOXYzine (ATARAX/VISTARIL) 10 MG tablet Take 1 tablet (10 mg total) by mouth 3 (three) times daily as needed. 08/05/15   Bonney AidAlyssa A Haney, MD  ibuprofen (ADVIL,MOTRIN) 800 MG tablet TAKE ONE TABLET BY MOUTH EVERY 8 HOURS AS NEEDED 06/24/15   Ardith Darkaleb M Parker, MD  lisinopril (PRINIVIL,ZESTRIL) 10 MG tablet Take 0.5 tablets (5 mg total) by mouth daily. Patient taking differently: Take 10 mg by mouth daily.  03/07/15   Ardith Darkaleb M Parker, MD  methadone (DOLOPHINE) 5 MG/5ML solution Take 50 mg by mouth every morning.     Historical Provider, MD   permethrin (PERMETHRIN LICE TREATMENT) 1 % lotion Apply 1 application topically once. Apply to affected areas. Rinse after 10 min; repeat in 1 week if needed 08/05/15   Alyssa A Haney, MD   BP 157/142 mmHg  Pulse 116  Temp(Src) 98.7 F (37.1 C) (Oral)  Resp 20  SpO2 97% Physical Exam  Constitutional: She is oriented to person, place, and time. She appears well-developed and well-nourished.  HENT:  Head: Normocephalic and atraumatic.  Eyes: Conjunctivae and EOM are normal. Pupils are equal, round, and reactive to light.  Neck: Normal range of motion. Neck supple.  Cardiovascular: Normal rate.   Pulmonary/Chest: Effort normal. No respiratory distress.  Abdominal: She exhibits no distension.  Musculoskeletal: Normal range of motion.  Neurological: She is alert and oriented to person, place, and time.  Skin: Skin is warm and dry.  No obvious rash. Multiple excoriations to the neck and upper arms.  Psychiatric: She has a normal mood and affect. Her behavior is normal.  Nursing note and vitals reviewed.  ED Course  Procedures (including critical care time) DIAGNOSTIC STUDIES: Oxygen Saturation is 97% on RA, normal by my interpretation.    COORDINATION OF CARE: 8:57 PM Discussed treatment plan with pt at bedside and pt agreed to plan.  MDM   Final diagnoses:  Itching    Patient emergency department complaining of a rash that is "all over." She states that she feels like there is bulbous crawling out of her skin and she has been ripping them out. She has had multiple prescriptions for permethrin, and she brought one of them with her. She states she is using it with no relief. She states she got rid of her sheets, mattress, treated her house for fleas, but feels like bugs are still crawling under her skin. I do not see any obvious rash, excoriations are noted. She is pointing to freckles that are on her skin saying "you see those things, those are bugs under my skin." She is using  hydroxyzine for itching with no relief. I advised her to continue to use hydroxyzine for itching, continue permethrin, follow-up with dermatology. Patient became upset, after I explained to her that I thought those spots on her body were just freckles. I then explained to her that although I think these are freckles, if she believes she has a rash she will need to see a specialist and that we are unable to take skin samples here. She became more angry that we are not willing to help and walked out of the emergency department.  Filed Vitals:   08/08/15 2032  BP: 157/142  Pulse: 116  Temp: 98.7 F (37.1 C)  TempSrc: Oral  Resp: 20  SpO2: 97%   I personally performed the services described in this documentation, which was scribed in my presence. The recorded information has  been reviewed and is accurate.   Jaynie Crumble, PA-C 08/09/15 1610  Gwyneth Sprout, MD 08/12/15 2024

## 2015-08-08 NOTE — ED Notes (Signed)
Pt has been treated for lice and bed bugs, she states that she is still itching and burning all over, she has areas on her that look like bites but i do not see any bugs on her, she states that she has gone through several tubes of cream

## 2015-08-10 ENCOUNTER — Encounter (HOSPITAL_COMMUNITY): Payer: Self-pay | Admitting: *Deleted

## 2015-08-10 ENCOUNTER — Emergency Department (HOSPITAL_COMMUNITY)
Admission: EM | Admit: 2015-08-10 | Discharge: 2015-08-10 | Disposition: A | Discharge status: Home / Self Care | Payer: No Typology Code available for payment source | Attending: Emergency Medicine | Admitting: Emergency Medicine

## 2015-08-10 DIAGNOSIS — Z8611 Personal history of tuberculosis: Secondary | ICD-10-CM | POA: Insufficient documentation

## 2015-08-10 DIAGNOSIS — Z7982 Long term (current) use of aspirin: Secondary | ICD-10-CM | POA: Insufficient documentation

## 2015-08-10 DIAGNOSIS — I10 Essential (primary) hypertension: Secondary | ICD-10-CM | POA: Insufficient documentation

## 2015-08-10 DIAGNOSIS — Z79899 Other long term (current) drug therapy: Secondary | ICD-10-CM | POA: Insufficient documentation

## 2015-08-10 DIAGNOSIS — L299 Pruritus, unspecified: Secondary | ICD-10-CM

## 2015-08-10 DIAGNOSIS — Z8619 Personal history of other infectious and parasitic diseases: Secondary | ICD-10-CM | POA: Insufficient documentation

## 2015-08-10 DIAGNOSIS — F17200 Nicotine dependence, unspecified, uncomplicated: Secondary | ICD-10-CM | POA: Insufficient documentation

## 2015-08-10 MED ORDER — HYDROCORTISONE 1 % EX CREA: 1.0000 "application " | TOPICAL_CREAM | Freq: Two times a day (BID) | CUTANEOUS | Status: DC

## 2015-08-10 NOTE — ED Notes (Signed)
Declined W/C at D/C and was escorted to lobby by RN. 

## 2015-08-10 NOTE — ED Notes (Signed)
Pt reports rash and itching to back of head/neck and on her back. Reports going to md and given meds for itching and also used lice treatment with no relief. Reports having very small clear bugs on her.

## 2015-08-10 NOTE — ED Provider Notes (Signed)
CSN: [id number]     Arrival date & time 08/10/15  [id number] History  By signing my name below, I, Placido SouLogan Joldersma, attest that this documentation has been prepared under the direction and in the presence of [employer] [place] Souter, PA-C. Electronically Signed: Placido SouLogan Joldersma, ED Scribe. 08/10/2015. 10:12 AM.   Chief Complaint  Patient presents with  . Rash   The history is provided by the patient. No language interpreter was used.   HPI Comments: Shearon BaloSamantha Beverlin is a 49 y.o. female with a hx of opioid dependence who was seen 2 days ago for a diffuse rash who presents to the Emergency Department complaining of a diffuse rash with onset 3 weeks ago. Pt notes having been feeding a neighbor's cat who she says has been "having bug issues". She notes having been using Rid lice treatment as well as her prescribed hydroxyzine and permethrin which she says has provided no relief and requests something to "get them off of her body". She notes specifically feeling the bugs on her posterior neck, diffuse back, and posterior legs. Pt notes associated, moderate, itchiness that she describes as a burning. She denies any other associated symptoms at this time.   Past Medical History  Diagnosis Date  . Tuberculosis     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 age 49, now s/p 1-yr of multidrug treatment (dates unknown)  . Hypertension   . Hepatitis C antibody test positive   . ASCUS (atypical squamous cells of undetermined significance) on Pap smear   . Opioid dependence (HCC)   . Trichomonas 04/17/2011    Diagnosed 04/02/11 during hospitalization, treated with Flagyl 2g, patient instructed to have partner treated (must follow-up)    Past Surgical History  Procedure Laterality Date  . Cesarean section  1984  . Salpingectomy Left March 2010    ectopic pregnancy  . Dilation and curettage of uterus    . I&d extremity  10/18/2011    Procedure: IRRIGATION AND DEBRIDEMENT EXTREMITY;  Surgeon: Sharma CovertFred W Ortmann, MD;  [facility];  Service: Orthopedics;  Laterality: Left;  . I&d extremity  10/21/2011    Procedure: IRRIGATION AND DEBRIDEMENT EXTREMITY;  Surgeon: Sharma CovertFred W Ortmann, MD;  Location: [facility] OR;  Service: Orthopedics;  Laterality: Left;   Family History  Problem Relation Age of Onset  . Heart attack Mother   . Heart attack Father   . Heart attack Paternal Grandmother   . Heart attack Paternal Grandfather   . Cirrhosis Brother    Social History  Substance Use Topics  . Smoking status: Current Every Day Smoker -- 1.00 packs/day for 20 years  . Smokeless tobacco: Never Used  . Alcohol Use: No   OB History    No data available     Review of Systems  All other systems reviewed and are negative.  Allergies  Review of patient's allergies indicates no known allergies.  Home Medications   Prior to Admission medications   Medication Sig Start Date End Date Taking? Authorizing Provider  Aspirin-Acetaminophen (GOODY BODY PAIN) 500-325 MG PACK Take 1 packet by mouth every 8 (eight) hours as needed (pain).    Historical Provider, MD  esomeprazole (NEXIUM) 40 MG capsule Take 1 capsule (40 mg total) by mouth daily before breakfast. 06/25/14   Ardith Darkaleb M Parker, MD  hydrochlorothiazide (HYDRODIURIL) 25 MG tablet TAKE ONE TABLET BY MOUTH ONCE DAILY 04/10/15   Ardith Darkaleb M Parker, MD  HYDROcodone-acetaminophen (NORCO/VICODIN) 5-325 MG per tablet Take 1 tablet by mouth every 6 (six) hours as  needed for moderate pain. 09/23/14   Charlestine Night, PA-C  hydrOXYzine (ATARAX/VISTARIL) 10 MG tablet Take 1 tablet (10 mg total) by mouth 3 (three) times daily as needed. 08/05/15   Bonney Aid, MD  ibuprofen (ADVIL,MOTRIN) 800 MG tablet TAKE ONE TABLET BY MOUTH EVERY 8 HOURS AS NEEDED 06/24/15   Ardith Dark, MD  lisinopril (PRINIVIL,ZESTRIL) 10 MG tablet Take 0.5 tablets (5 mg total) by mouth daily. Patient taking differently: Take 10 mg by mouth daily.  03/07/15   Ardith Dark, MD  methadone (DOLOPHINE) 5 MG/5ML solution Take  50 mg by mouth every morning.     Historical Provider, MD  permethrin (PERMETHRIN LICE TREATMENT) 1 % lotion Apply 1 application topically once. Apply to affected areas. Rinse after 10 min; repeat in 1 week if needed 08/05/15   Alyssa A Haney, MD   BP 147/102 mmHg  Pulse 79  Temp(Src) 98.7 F (37.1 C) (Oral)  Resp 18  SpO2 99% Physical Exam  Constitutional: She is oriented to person, place, and time. She appears well-developed and well-nourished.  HENT:  Head: Normocephalic and atraumatic.  Mouth/Throat: No oropharyngeal exudate.  Neck: Normal range of motion. No tracheal deviation present.  Cardiovascular: Normal rate.   Pulmonary/Chest: Effort normal. No respiratory distress.  Abdominal: Soft. There is no tenderness.  Musculoskeletal: Normal range of motion.  Neurological: She is alert and oriented to person, place, and time.  Skin: Skin is warm and dry. She is not diaphoretic.  No rashes, burrows, warmth. Excoriated skin to upper back and forearms with no surrounding cellulitis or discharge. No lesions on the palms, soles or mucous membranes.  Psychiatric:  Agitated  Nursing note and vitals reviewed.  ED Course  Procedures  DIAGNOSTIC STUDIES: Oxygen Saturation is 97% on RA, normal by my interpretation.    COORDINATION OF CARE: 10:09 AM Pt presents today due to diffuse itchiness. Discussed next steps with pt. Pt agreed to plan.  Labs Review Labs Reviewed - No data to display  Imaging Review No results found.   EKG Interpretation None      MDM   Final diagnoses:  Pruritus    Filed Vitals:   08/10/15 0940  BP: 147/102  Pulse: 79  Temp: 98.7 F (37.1 C)  TempSrc: Oral  Resp: 18  SpO2: 99%    Kaleah Hagemeister is 49 y.o. female presenting with complaints of bugs on her body. She produces a particulate which she describes as above. She is in the process of having an exterminator to her house. This does not appear to be scabies, no overt bug bites or rashes,  no jaundice to suggest hyperbilirubinemia. Unclear if this is psychogenic formication. She's had multiple treatments with permethrin and is taking Vistaril. Recommend she follow closely with primary care and make appointment with dermatology that was given earlier.  Evaluation does not show pathology that would require ongoing emergent intervention or inpatient treatment. Pt is hemodynamically stable and mentating appropriately. Discussed findings and plan with patient/guardian, who agrees with care plan. All questions answered. Return precautions discussed and outpatient follow up given.   Discharge Medication List as of 08/10/2015 10:15 AM    START taking these medications   Details  hydrocortisone cream 1 % Apply 1 application topically 2 (two) times daily. Do not apply to face, Starting 08/10/2015, Until Discontinued, Print        I personally performed the services described in this documentation, which was scribed in my presence. The recorded information has been  reviewed and is accurate.    Wynetta Emery, PA-C 08/10/15 1206  Wynetta Emery, PA-C 08/10/15 0865  Vanetta Mulders, MD 08/11/15 [phone]

## 2015-08-10 NOTE — Discharge Instructions (Signed)
Please follow with your primary care doctor in the next 2 days for a check-up. They must obtain records for further management.   Do not hesitate to return to the Emergency Department for any new, worsening or concerning symptoms.    Pruritus Pruritus is an itching feeling. There are many different conditions and factors that can make your skin itchy. Dry skin is one of the most common causes of itching. Most cases of itching do not require medical attention. Itchy skin can turn into a rash.  HOME CARE INSTRUCTIONS  Watch your pruritus for any changes. Take these steps to help with your condition:  Skin Care  Moisturize your skin as needed. A moisturizer that contains petroleum jelly is best for keeping moisture in your skin.  Take or apply medicines only as directed by your health care provider. This may include:  Corticosteroid cream.  Anti-itch lotions.  Oral anti-histamines.  Apply cool compresses to the affected areas.  Try taking a bath with:  Epsom salts. Follow the instructions on the packaging. You can get these at your local pharmacy or grocery store.  Baking soda. Pour a small amount into the bath as directed by your health care provider.  Colloidal oatmeal. Follow the instructions on the packaging. You can get this at your local pharmacy or grocery store.  Try applying baking soda paste to your skin. Stir water into baking soda until it reaches a paste-like consistency.   Do not scratch your skin.  Avoid hot showers or baths, which can make itching worse. A cold shower may help with itching as long as you use a moisturizer after.  Avoid scented soaps, detergents, and perfumes. Use gentle soaps, detergents, perfumes, and other cosmetic products. General Instructions  Avoid wearing tight clothes.  Keep a journal to help track what causes your itch. Write down:  What you eat.  What cosmetic products you use.  What you drink.  What you wear. This includes  jewelry.  Use a humidifier. This keeps the air moist, which helps to prevent dry skin. SEEK MEDICAL CARE IF:  The itching does not go away after several days.  You sweat at night.  You have weight loss.  You are unusually thirsty.  You urinate more than normal.  You are more tired than normal.  You have abdominal pain.  Your skin tingles.  You feel weak.  Your skin or the whites of your eyes look yellow (jaundice).  Your skin feels numb.   This information is not intended to replace advice given to you by your health care provider. Make sure you discuss any questions you have with your health care provider.   Document Released: 05/20/2011 Document Revised: 01/22/2015 Document Reviewed: 09/03/2014 Elsevier Interactive Patient Education [employer]2016 [employer] Inc.

## 2015-08-17 ENCOUNTER — Encounter (HOSPITAL_COMMUNITY): Payer: Self-pay | Admitting: Emergency Medicine

## 2015-08-17 ENCOUNTER — Emergency Department (HOSPITAL_COMMUNITY)
Admission: EM | Admit: 2015-08-17 | Discharge: 2015-08-17 | Disposition: A | Discharge status: Home / Self Care | Payer: No Typology Code available for payment source | Attending: Emergency Medicine | Admitting: Emergency Medicine

## 2015-08-17 DIAGNOSIS — Z8611 Personal history of tuberculosis: Secondary | ICD-10-CM | POA: Insufficient documentation

## 2015-08-17 DIAGNOSIS — Z8619 Personal history of other infectious and parasitic diseases: Secondary | ICD-10-CM | POA: Insufficient documentation

## 2015-08-17 DIAGNOSIS — Z79899 Other long term (current) drug therapy: Secondary | ICD-10-CM | POA: Insufficient documentation

## 2015-08-17 DIAGNOSIS — I10 Essential (primary) hypertension: Secondary | ICD-10-CM | POA: Insufficient documentation

## 2015-08-17 DIAGNOSIS — R21 Rash and other nonspecific skin eruption: Secondary | ICD-10-CM | POA: Insufficient documentation

## 2015-08-17 DIAGNOSIS — F17200 Nicotine dependence, unspecified, uncomplicated: Secondary | ICD-10-CM | POA: Insufficient documentation

## 2015-08-17 DIAGNOSIS — L299 Pruritus, unspecified: Secondary | ICD-10-CM

## 2015-08-17 MED ORDER — HYDROXYZINE HCL 25 MG PO TABS: 25.0000 mg | ORAL_TABLET | Freq: Four times a day (QID) | ORAL | Status: DC

## 2015-08-17 NOTE — ED Notes (Signed)
[facility]. Pt was told to get lice medication from store and was given Vistaril. Pt sts that she has not had relief. Pt sts that she is "going crazy" because of itching. Pt is A&O and in NAD

## 2015-08-17 NOTE — Discharge Instructions (Signed)
Take you medication as prescribed. I also recommend applying Aquaphor to your arms, legs and back for relief.  Follow up with Dermatology next week. Please return to the Emergency Department if symptoms worsen or new onset of fever, lesions in your mouth, difficulty breathing, swelling, abdominal pain, nausea, vomiting, urinary symptoms, numbness, tingling, weakness.

## 2015-08-17 NOTE — ED Provider Notes (Signed)
CSN: [id number]     Arrival date & time 08/17/15  1352 History   First MD Initiated Contact with Patient 08/17/15 1410     Chief Complaint  Patient presents with  . Pruritis     (Consider location/radiation/quality/duration/timing/severity/associated sxs/prior Treatment) HPI   Patient is a 49 year old female with history of heroin abuse (she reports she has been clean for the past 2 years and is currently on Methadone) who presents the ED with complaint of pruritus. He shouldn't reports she has had itching over her entire body including her scalp for the past 2 weeks and reports feeling like there are bugs on her. Denies any other complaints. She notes she was initially seen by her PCP and was given Vistaril and permethrin for possible lice. Patient reports she is used 5 treatments for lice with no improvement. She reports she was seen in the ED 11/18, was advised to continue using hydroxyzine and permethrin for itching and was advised to follow-up with a dermatologist. She reports she has not seen a dermatologist but states she then came back to the ED on 11/19, given hydrocortisone cream and advised to follow up with PCP. Pt reports no relief with any of the txs and notes the itching has worsened since onset. Denies any changes in soaps, lotions, detergents, medications of new exposures/irritants. She reports she has run out of all of her sheets and her mattress and additionally has had her home checked for bugs.  Past Medical History  Diagnosis Date  . Tuberculosis     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 age 49, now s/p 1-yr of multidrug treatment (dates unknown)  . Hypertension   . Hepatitis C antibody test positive   . ASCUS (atypical squamous cells of undetermined significance) on Pap smear   . Opioid dependence (HCC)   . Trichomonas 04/17/2011    Diagnosed 04/02/11 during hospitalization, treated with Flagyl 2g, patient instructed to have partner treated (must follow-up)    Past  Surgical History  Procedure Laterality Date  . Cesarean section  1984  . Salpingectomy Left March 2010    ectopic pregnancy  . Dilation and curettage of uterus    . I&d extremity  10/18/2011    Procedure: IRRIGATION AND DEBRIDEMENT EXTREMITY;  Surgeon: Sharma CovertFred W Ortmann, MD;  [facility];  Service: Orthopedics;  Laterality: Left;  . I&d extremity  10/21/2011    Procedure: IRRIGATION AND DEBRIDEMENT EXTREMITY;  Surgeon: Sharma CovertFred W Ortmann, MD;  Location: [facility] OR;  Service: Orthopedics;  Laterality: Left;   Family History  Problem Relation Age of Onset  . Heart attack Mother   . Heart attack Father   . Heart attack Paternal Grandmother   . Heart attack Paternal Grandfather   . Cirrhosis Brother    Social History  Substance Use Topics  . Smoking status: Current Every Day Smoker -- 1.00 packs/day for 20 years  . Smokeless tobacco: Never Used  . Alcohol Use: No   OB History    No data available     Review of Systems  Skin: Positive for rash (pruritis).  All other systems reviewed and are negative.     Allergies  Review of patient's allergies indicates no known allergies.  Home Medications   Prior to Admission medications   Medication Sig Start Date End Date Taking? Authorizing Provider  esomeprazole (NEXIUM) 40 MG capsule Take 1 capsule (40 mg total) by mouth daily before breakfast. 06/25/14  Yes Ardith Darkaleb M Parker, MD  hydrochlorothiazide (HYDRODIURIL) 25 MG tablet  TAKE ONE TABLET BY MOUTH ONCE DAILY 04/10/15  Yes Ardith Dark, MD  hydrocortisone cream 1 % Apply 1 application topically 2 (two) times daily. Do not apply to face 08/10/15  Yes Nicole Pisciotta, PA-C  ibuprofen (ADVIL,MOTRIN) 800 MG tablet TAKE ONE TABLET BY MOUTH EVERY 8 HOURS AS NEEDED 06/24/15  Yes Ardith Dark, MD  lisinopril (PRINIVIL,ZESTRIL) 10 MG tablet Take 0.5 tablets (5 mg total) by mouth daily. Patient taking differently: Take 10 mg by mouth daily.  03/07/15  Yes Ardith Dark, MD  methadone (DOLOPHINE) 5  MG/5ML solution Take 50 mg by mouth every morning.    Yes Historical Provider, MD  Aspirin-Acetaminophen (GOODY BODY PAIN) 500-325 MG PACK Take 1 packet by mouth every 8 (eight) hours as needed (pain).    Historical Provider, MD  HYDROcodone-acetaminophen (NORCO/VICODIN) 5-325 MG per tablet Take 1 tablet by mouth every 6 (six) hours as needed for moderate pain. Patient not taking: Reported on 08/17/2015 09/23/14   Charlestine Night, PA-C  hydrOXYzine (ATARAX/VISTARIL) 25 MG tablet Take 1 tablet (25 mg total) by mouth every 6 (six) hours. 08/17/15   Barrett Henle, PA-C  permethrin (PERMETHRIN LICE TREATMENT) 1 % lotion Apply 1 application topically once. Apply to affected areas. Rinse after 10 min; repeat in 1 week if needed Patient not taking: Reported on 08/17/2015 08/05/15   Alyssa A Haney, MD   BP 205/104 mmHg  Pulse 70  Resp 16  SpO2 99% Physical Exam  Constitutional: She is oriented to person, place, and time. She appears well-developed and well-nourished. No distress.  HENT:  Head: Normocephalic and atraumatic.  Right Ear: Tympanic membrane normal.  Left Ear: Tympanic membrane normal.  Nose: Nose normal.  Mouth/Throat: Uvula is midline, oropharynx is clear and moist and mucous membranes are normal. No oral lesions. No oropharyngeal exudate, posterior oropharyngeal edema or posterior oropharyngeal erythema.  Eyes: Conjunctivae and EOM are normal. Right eye exhibits no discharge. Left eye exhibits no discharge. No scleral icterus.  Neck: Normal range of motion. Neck supple.  Cardiovascular: Normal rate, regular rhythm, normal heart sounds and intact distal pulses.   Pulmonary/Chest: Effort normal and breath sounds normal. No respiratory distress. She has no wheezes. She has no rales. She exhibits no tenderness.  Abdominal: Soft. Bowel sounds are normal. She exhibits no distension and no mass. There is no tenderness. There is no rebound and no guarding.  Musculoskeletal: Normal  range of motion. She exhibits no edema.  Lymphadenopathy:    She has no cervical adenopathy.  Neurological: She is alert and oriented to person, place, and time.  Skin: Skin is warm and dry.  No rash. Multiple excoriations noted to the back, bilateral arms, bilateral legs, upper chest, forehead and bilateral ears with no surrounding erythema or drainage. No burrows, vesicles, papules, pustules. No lesions noted to the palms, soles and mucous membranes.  Nursing note and vitals reviewed.   ED Course  Procedures (including critical care time) Labs Review Labs Reviewed - No data to display  Imaging Review No results found. I have personally reviewed and evaluated these images and lab results as part of my medical decision-making.    MDM   Final diagnoses:  Pruritus    Patient presents with rash and itching over her entire body. Denies any new irritants or known exposures, no change in medications. She reports she feels like there are bugs on her body. She has been seen by her PCP and in the ED twice, she has not  f/u with dermatology as advised. She has been using permethrin and Vistaril and hydrocortisone cream with no relief. VSS. Exam revealed multiple excoriations over the entire body, no obvious rash or signs of infection. I discussed with patient that this presentation does not appear to be scabies or bug bites. Presentation and symptoms does not suggest liver disease. Plan to discharge patient home. Advised patient to continue using vistaril and to also use aquaphor for relief. Advised pt to follow up with dermatology.   Evaluation does not show pathology requring ongoing emergent intervention or admission. Pt is hemodynamically stable and mentating appropriately. Discussed findings/results and plan with patient/guardian, who agrees with plan. All questions answered. Return precautions discussed and outpatient follow up given.      Satira Sark [place], [place] 08/17/15  1624  [name], MD 08/17/15 1626

## 2015-08-19 ENCOUNTER — Encounter (HOSPITAL_COMMUNITY): Payer: Self-pay | Admitting: Emergency Medicine

## 2015-08-19 ENCOUNTER — Emergency Department (INDEPENDENT_AMBULATORY_CARE_PROVIDER_SITE_OTHER)
Admission: EM | Admit: 2015-08-19 | Discharge: 2015-08-19 | Disposition: A | Discharge status: Home / Self Care | Payer: Self-pay | Source: Home / Self Care

## 2015-08-19 DIAGNOSIS — L299 Pruritus, unspecified: Secondary | ICD-10-CM

## 2015-08-19 NOTE — Discharge Instructions (Signed)
Pruritus °Pruritus is an itching feeling. There are many different conditions and factors that can make your skin itchy. Dry skin is one of the most common causes of itching. Most cases of itching do not require medical attention. Itchy skin can turn into a rash.  °HOME CARE INSTRUCTIONS  °Watch your pruritus for any changes. Take these steps to help with your condition:  °Skin Care °· Moisturize your skin as needed. A moisturizer that contains petroleum jelly is best for keeping moisture in your skin. °· Take or apply medicines only as directed by your health care provider. This may include: °¨ Corticosteroid cream. °¨ Anti-itch lotions. °¨ Oral anti-histamines. °· Apply cool compresses to the affected areas. °· Try taking a bath with: °¨ Epsom salts. Follow the instructions on the packaging. You can get these at your local pharmacy or grocery store. °¨ Baking soda. Pour a small amount into the bath as directed by your health care provider. °¨ Colloidal oatmeal. Follow the instructions on the packaging. You can get this at your local pharmacy or grocery store. °· Try applying baking soda paste to your skin. Stir water into baking soda until it reaches a paste-like consistency.   °· Do not scratch your skin. °· Avoid hot showers or baths, which can make itching worse. A cold shower may help with itching as long as you use a moisturizer after. °· Avoid scented soaps, detergents, and perfumes. Use gentle soaps, detergents, perfumes, and other cosmetic products. °General Instructions °· Avoid wearing tight clothes. °· Keep a journal to help track what causes your itch. Write down: °¨ What you eat. °¨ What cosmetic products you use. °¨ What you drink. °¨ What you wear. This includes jewelry. °· Use a humidifier. This keeps the air moist, which helps to prevent dry skin. °SEEK MEDICAL CARE IF: °· The itching does not go away after several days. °· You sweat at night. °· You have weight loss. °· You are unusually  thirsty. °· You urinate more than normal. °· You are more tired than normal. °· You have abdominal pain. °· Your skin tingles. °· You feel weak. °· Your skin or the whites of your eyes look yellow (jaundice). °· Your skin feels numb. °  °This information is not intended to replace advice given to you by your health care provider. Make sure you discuss any questions you have with your health care provider. °  °Document Released: 05/20/2011 Document Revised: 01/22/2015 Document Reviewed: 09/03/2014 °Elsevier Interactive Patient Education ©2016 [employer] ° °

## 2015-08-19 NOTE — ED Notes (Addendum)
Pt here with progressive rash with itching all over body. Pt has been seen multiple times in ER and visit with PCP for rash Instructed to buy lice medicine OTC without relief Open small red marks all over with oozing  Outbreak started 2 weeks ago Blood pressure elevated @ 217/102 71, taking Lisinopril and HCTZ

## 2015-08-20 NOTE — ED Provider Notes (Signed)
CSN: [id number]     Arrival date & time 08/19/15  1306 History   None    Chief Complaint  Patient presents with  . Pruritis  . Hypertension   (Consider location/radiation/quality/duration/timing/severity/associated sxs/prior Treatment) HPI History obtained from patient:   LOCATION: skin arms SEVERITY:no pain just itch DURATION:several weeks CONTEXT:initially treated for scabies X2, eczema not any better QUALITY: MODIFYING FACTORS:multiple creams, lotions ASSOCIATED SYMPTOMS:now has black bugs in skin TIMING:constant OCCUPATION:disabled   Past Medical History  Diagnosis Date  . Tuberculosis     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 age 49, now s/p 1-yr of multidrug treatment (dates unknown)  . Hypertension   . Hepatitis C antibody test positive   . ASCUS (atypical squamous cells of undetermined significance) on Pap smear   . Opioid dependence (HCC)   . Trichomonas 04/17/2011    Diagnosed 04/02/11 during hospitalization, treated with Flagyl 2g, patient instructed to have partner treated (must follow-up)    Past Surgical History  Procedure Laterality Date  . Cesarean section  1984  . Salpingectomy Left March 2010    ectopic pregnancy  . Dilation and curettage of uterus    . I&d extremity  10/18/2011    Procedure: IRRIGATION AND DEBRIDEMENT EXTREMITY;  Surgeon: Sharma CovertFred W Ortmann, MD;  [facility];  Service: Orthopedics;  Laterality: Left;  . I&d extremity  10/21/2011    Procedure: IRRIGATION AND DEBRIDEMENT EXTREMITY;  Surgeon: Sharma CovertFred W Ortmann, MD;  Location: [facility] OR;  Service: Orthopedics;  Laterality: Left;   Family History  Problem Relation Age of Onset  . Heart attack Mother   . Heart attack Father   . Heart attack Paternal Grandmother   . Heart attack Paternal Grandfather   . Cirrhosis Brother    Social History  Substance Use Topics  . Smoking status: Current Every Day Smoker -- 1.00 packs/day for 20 years  . Smokeless tobacco: Never Used  . Alcohol Use: No   OB  History    No data available     Review of Systems ROS +'ve itching skin  Denies: HEADACHE, NAUSEA, ABDOMINAL PAIN, CHEST PAIN, CONGESTION, DYSURIA, SHORTNESS OF BREATH  Allergies  Review of patient's allergies indicates no known allergies.  Home Medications   Prior to Admission medications   Medication Sig Start Date End Date Taking? Authorizing Provider  Aspirin-Acetaminophen (GOODY BODY PAIN) 500-325 MG PACK Take 1 packet by mouth every 8 (eight) hours as needed (pain).    Historical Provider, MD  esomeprazole (NEXIUM) 40 MG capsule Take 1 capsule (40 mg total) by mouth daily before breakfast. 06/25/14   Ardith Darkaleb M Parker, MD  hydrochlorothiazide (HYDRODIURIL) 25 MG tablet TAKE ONE TABLET BY MOUTH ONCE DAILY 04/10/15   Ardith Darkaleb M Parker, MD  HYDROcodone-acetaminophen (NORCO/VICODIN) 5-325 MG per tablet Take 1 tablet by mouth every 6 (six) hours as needed for moderate pain. Patient not taking: Reported on 08/17/2015 09/23/14   Charlestine Nighthristopher Lawyer, PA-C  hydrocortisone cream 1 % Apply 1 application topically 2 (two) times daily. Do not apply to face 08/10/15   [facility] CenterNicole Pisciotta, PA-C  hydrOXYzine (ATARAX/VISTARIL) 25 MG tablet Take 1 tablet (25 mg total) by mouth every 6 (six) hours. 08/17/15   Barrett HenleNicole Elizabeth Nadeau, PA-C  ibuprofen (ADVIL,MOTRIN) 800 MG tablet TAKE ONE TABLET BY MOUTH EVERY 8 HOURS AS NEEDED 06/24/15   Ardith Darkaleb M Parker, MD  lisinopril (PRINIVIL,ZESTRIL) 10 MG tablet Take 0.5 tablets (5 mg total) by mouth daily. Patient taking differently: Take 10 mg by mouth daily.  03/07/15  Ardith Dark, MD  methadone (DOLOPHINE) 5 MG/5ML solution Take 50 mg by mouth every morning.     Historical Provider, MD  permethrin (PERMETHRIN LICE TREATMENT) 1 % lotion Apply 1 application topically once. Apply to affected areas. Rinse after 10 min; repeat in 1 week if needed Patient not taking: Reported on 08/17/2015 08/05/15   Bonney Aid, MD   Meds Ordered and Administered this Visit  Medications  - No data to display  BP 217/102 mmHg  Pulse 71  Temp(Src) 97.6 F (36.4 C) (Oral)  SpO2 100%  LMP 08/19/2015 No data found.   Physical Exam  Neurological: She is alert.  Skin: Skin is dry. Rash noted.  Dry excoriated areas both arms. Minimal trunk involvement no signs of cellulitis or scabies infection.   Psychiatric: Her mood appears anxious. Her speech is rapid and/or pressured.    ED Course  Procedures (including critical care time)  Labs Review Labs Reviewed - No data to display  Imaging Review No results found.   Visual Acuity Review  Right Eye Distance:   Left Eye Distance:   Bilateral Distance:    Right Eye Near:   Left Eye Near:    Bilateral Near:         MDM   1. Pruritic dermatitis    Pt's skin looks very much like this is a psychogenic pruritic issue.  Pt feels she has little black insects embedded in her skin. I have looked at the black "insects" under magnification of the fundoscope, and they appear to be dried dermis "scabs". Pt will not be talked out of her belief and has asked for referral to skin specialist.      Tharon Aquas, PA 08/20/15 [phone]

## 2015-08-21 ENCOUNTER — Ambulatory Visit (INDEPENDENT_AMBULATORY_CARE_PROVIDER_SITE_OTHER): Payer: Self-pay | Admitting: Family Medicine

## 2015-08-21 ENCOUNTER — Encounter: Payer: Self-pay | Admitting: Family Medicine

## 2015-08-21 VITALS — BP 176/114 | HR 90 | Temp 98.3°F | Ht 63.0 in | Wt 141.6 lb

## 2015-08-21 DIAGNOSIS — F4322 Adjustment disorder with anxiety: Secondary | ICD-10-CM

## 2015-08-21 DIAGNOSIS — F4323 Adjustment disorder with mixed anxiety and depressed mood: Secondary | ICD-10-CM

## 2015-08-21 DIAGNOSIS — Z206 Contact with and (suspected) exposure to human immunodeficiency virus [HIV]: Secondary | ICD-10-CM

## 2015-08-21 DIAGNOSIS — I10 Essential (primary) hypertension: Secondary | ICD-10-CM

## 2015-08-21 DIAGNOSIS — R21 Rash and other nonspecific skin eruption: Secondary | ICD-10-CM

## 2015-08-21 DIAGNOSIS — L299 Pruritus, unspecified: Secondary | ICD-10-CM

## 2015-08-21 MED ORDER — DESONIDE 0.05 % EX CREA: TOPICAL_CREAM | Freq: Two times a day (BID) | CUTANEOUS | Status: DC

## 2015-08-21 NOTE — Assessment & Plan Note (Signed)
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 R/O Metabolic vs endocrine disorder. TSH, CMET  And HIV ordered. Urine toxicology also done based on her anxiety. Low potency topical steroid prescribed. Also continue benadryl as needed.

## 2015-08-21 NOTE — Progress Notes (Signed)
Subjective:     Patient ID: Ariana Jensen, female   DOB: 1966-04-14, 49 y.o.   MRN: [id number]  Rash This is a new problem. Episode onset: Feels like bugs on her skin for 2 wks. The problem has been gradually worsening since onset. The rash is diffuse. The rash is characterized by burning, dryness, itchiness and redness. She was exposed to nothing. Pertinent negatives include no fatigue, fever, joint pain, nail changes, sore throat or vomiting. Treatments tried: She tried permethrin for lice. The treatment provided no relief.  Patient stated she feels like there are bugs on her body. She called the pest control who came to clean her house up. She changed her bed sheet. This has never happened to her before. The itching is stressing her up. She had been to the ED and urgent care multiple times. She is currently on benadryl prn with no improvement. HTN: Currently on HCTZ 25 mg PO and Lisinopril 10 mg daily. She stated she took her medications already this morning. She stated since she started itching her BP had been running high due to stress. She denies any neurologic symptoms. Other than her itching she is feeling well.  Current Outpatient Prescriptions on File Prior to Visit  Medication Sig Dispense Refill  . hydrochlorothiazide (HYDRODIURIL) 25 MG tablet TAKE ONE TABLET BY MOUTH ONCE DAILY 90 tablet 0  . lisinopril (PRINIVIL,ZESTRIL) 10 MG tablet Take 0.5 tablets (5 mg total) by mouth daily. (Patient taking differently: Take 10 mg by mouth daily. ) 90 tablet 0  . Aspirin-Acetaminophen (GOODY BODY PAIN) 500-325 MG PACK Take 1 packet by mouth every 8 (eight) hours as needed (pain).    Marland Kitchen esomeprazole (NEXIUM) 40 MG capsule Take 1 capsule (40 mg total) by mouth daily before breakfast. 90 capsule 3  . HYDROcodone-acetaminophen (NORCO/VICODIN) 5-325 MG per tablet Take 1 tablet by mouth every 6 (six) hours as needed for moderate pain. (Patient not taking: Reported on 08/17/2015) 15 tablet 0  .  hydrocortisone cream 1 % Apply 1 application topically 2 (two) times daily. Do not apply to face 42 g 0  . hydrOXYzine (ATARAX/VISTARIL) 25 MG tablet Take 1 tablet (25 mg total) by mouth every 6 (six) hours. 12 tablet 0  . ibuprofen (ADVIL,MOTRIN) 800 MG tablet TAKE ONE TABLET BY MOUTH EVERY 8 HOURS AS NEEDED 30 tablet 0  . methadone (DOLOPHINE) 5 MG/5ML solution Take 50 mg by mouth every morning.     . permethrin (PERMETHRIN LICE TREATMENT) 1 % lotion Apply 1 application topically once. Apply to affected areas. Rinse after 10 min; repeat in 1 week if needed (Patient not taking: Reported on 08/17/2015) 120 mL 0   Current Facility-Administered Medications on File Prior to Visit  Medication Dose Route Frequency Provider Last Rate Last Dose  . hepatitis A virus (PF) vaccine (HAVRIX (PF)) 1440 EL U/ML injection 1,440 Units  1 mL Intramuscular Once Shelva Majestic, MD       Past Medical History  Diagnosis Date  . Tuberculosis     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 age 30, now s/p 1-yr of multidrug treatment (dates unknown)  . Hypertension   . Hepatitis C antibody test positive   . ASCUS (atypical squamous cells of undetermined significance) on Pap smear   . Opioid dependence (HCC)   . Trichomonas 04/17/2011    Diagnosed 04/02/11 during hospitalization, treated with Flagyl 2g, patient instructed to have partner treated (must follow-up)       Review of Systems  Constitutional: Negative  for fever and fatigue.  HENT: Negative for sore throat.   Respiratory: Negative.   Cardiovascular: Negative.   Gastrointestinal: Negative for vomiting.  Musculoskeletal: Negative for joint pain.  Skin: Positive for rash. Negative for nail changes.  Neurological: Negative.   Psychiatric/Behavioral: The patient is nervous/anxious.   All other systems reviewed and are negative.      Filed Vitals:   08/21/15 1424 08/21/15 1501  BP: 166/108 176/114  Pulse: 90   Temp: 98.3 F (36.8 C)   TempSrc: Oral    Height: 5\' 3"  (1.6 m)   Weight: 141 lb 9 oz (64.212 kg)     Objective:   Physical Exam  Constitutional: She appears well-developed. No distress.  HENT:  Head: Normocephalic.  Skin: Rash noted.     Psychiatric: Her mood appears anxious. Her speech is not rapid and/or pressured. She expresses no homicidal and no suicidal ideation.  Nursing note and vitals reviewed.        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

## 2015-08-21 NOTE — Assessment & Plan Note (Signed)
Likely poor compliance vs anxiety. I advised her to see her PCP sometime this week or next to reassess her BP. In the mean time continue current BP regimen.

## 2015-08-21 NOTE — Patient Instructions (Signed)

## 2015-08-22 ENCOUNTER — Telehealth: Payer: Self-pay | Admitting: Family Medicine

## 2015-08-22 ENCOUNTER — Telehealth: Payer: Self-pay | Admitting: *Deleted

## 2015-08-22 LAB — COMPLETE METABOLIC PANEL WITHOUT GFR
ALT: 12 U/L (ref 6–29)
AST: 22 U/L (ref 10–35)
Albumin: 3.9 g/dL (ref 3.6–5.1)
Alkaline Phosphatase: 65 U/L (ref 33–115)
BUN: 14 mg/dL (ref 7–25)
CO2: 23 mmol/L (ref 20–31)
Calcium: 9.4 mg/dL (ref 8.6–10.2)
Chloride: 100 mmol/L (ref 98–110)
Creat: 0.85 mg/dL (ref 0.50–1.10)
GFR, Est African American: >89 mL/min
GFR, Est Non African American: 82 mL/min
Glucose, Bld: 93 mg/dL (ref 65–99)
Potassium: 4 mmol/L (ref 3.5–5.3)
Sodium: 135 mmol/L (ref 135–146)
Total Bilirubin: 0.3 mg/dL (ref 0.2–1.2)
Total Protein: 7.9 g/dL (ref 6.1–8.1)

## 2015-08-22 LAB — DRUG SCREEN, URINE
AMPHETAMINE SCRN UR: NEGATIVE
BENZODIAZEPINES.: NEGATIVE
Barbiturate Quant, Ur: NEGATIVE
CREATININE, U: 122.71 mg/dL
Cocaine Metabolites: POSITIVE — AB
MARIJUANA METABOLITE: NEGATIVE
METHADONE: POSITIVE — AB
Opiates: NEGATIVE
PHENCYCLIDINE (PCP): NEGATIVE
PROPOXYPHENE: NEGATIVE

## 2015-08-22 LAB — HIV ANTIBODY (ROUTINE TESTING W REFLEX): HIV 1&2 Ab, 4th Generation: NONREACTIVE

## 2015-08-22 LAB — TSH: TSH: 0.807 u[IU]/mL (ref 0.350–4.500)

## 2015-08-22 NOTE — Telephone Encounter (Signed)
Spoke with "Neville RouteDray" from [facility] stating that prescription for desonide cream 0.05% ordered on 08/21/15  would be $100 out of pocket. Ointment would be more expensive as would westcort 0.02%. Spoke with Dr. Lum BabeEniola and received verbal order for triamcinolone cream 0.5% 15 g NR. Fredderick SeveranceUCATTE, LAURENZE L, RN

## 2015-08-22 NOTE — Telephone Encounter (Addendum)
I left a message for her to call back for test result discussion. The actual test result was not discussed.   Plan to discuss: Normal Bmet, TSH and HIV report.                            UDS positive for cocaine and opioid.                            Her itching and feeling of bugs on her skin is likely related to cocaine. Cocaine induced pruritus.                            I will route message to her PCP to follow-up on this as well.

## 2015-08-24 ENCOUNTER — Encounter (HOSPITAL_COMMUNITY): Payer: Self-pay | Admitting: Emergency Medicine

## 2015-08-24 ENCOUNTER — Emergency Department (HOSPITAL_COMMUNITY)
Admission: EM | Admit: 2015-08-24 | Discharge: 2015-08-24 | Disposition: A | Discharge status: Home / Self Care | Payer: No Typology Code available for payment source | Attending: Emergency Medicine | Admitting: Emergency Medicine

## 2015-08-24 DIAGNOSIS — I10 Essential (primary) hypertension: Secondary | ICD-10-CM | POA: Insufficient documentation

## 2015-08-24 DIAGNOSIS — Z8619 Personal history of other infectious and parasitic diseases: Secondary | ICD-10-CM | POA: Insufficient documentation

## 2015-08-24 DIAGNOSIS — F419 Anxiety disorder, unspecified: Secondary | ICD-10-CM | POA: Insufficient documentation

## 2015-08-24 DIAGNOSIS — Z8611 Personal history of tuberculosis: Secondary | ICD-10-CM | POA: Insufficient documentation

## 2015-08-24 DIAGNOSIS — L309 Dermatitis, unspecified: Secondary | ICD-10-CM | POA: Insufficient documentation

## 2015-08-24 DIAGNOSIS — Z79899 Other long term (current) drug therapy: Secondary | ICD-10-CM | POA: Insufficient documentation

## 2015-08-24 DIAGNOSIS — F17200 Nicotine dependence, unspecified, uncomplicated: Secondary | ICD-10-CM | POA: Insufficient documentation

## 2015-08-24 DIAGNOSIS — Z7952 Long term (current) use of systemic steroids: Secondary | ICD-10-CM | POA: Insufficient documentation

## 2015-08-24 MED ORDER — HYDROXYZINE HCL 25 MG PO TABS
25.0000 mg | ORAL_TABLET | Freq: Once | ORAL | Status: AC
  Administered 2015-08-24: 25 mg via ORAL
  Filled 2015-08-24: qty 1

## 2015-08-24 MED ORDER — DESONIDE 0.05 % EX CREA: TOPICAL_CREAM | Freq: Two times a day (BID) | CUTANEOUS | Status: DC

## 2015-08-24 MED ORDER — HYDROCORTISONE 0.5 % EX CREA
TOPICAL_CREAM | Freq: Three times a day (TID) | CUTANEOUS | Status: DC
  Filled 2015-08-24: qty 28.35

## 2015-08-24 MED ORDER — HYDROXYZINE HCL 25 MG PO TABS: 25.0000 mg | ORAL_TABLET | Freq: Four times a day (QID) | ORAL | Status: DC

## 2015-08-24 NOTE — ED Notes (Signed)
Per pt, states she has trees that carry bugs that bury themselves in skin-states she first noticed symptoms 2 weeks ago-saw Dermalogist and was given med that brings them to surface of skin-states now out of cream, states suffering from rash, bugs

## 2015-08-24 NOTE — Discharge Instructions (Signed)
As we discussed, your rash is most likely due to your cocaine and other drug use. If you would like support in quitting, please contact [facility] (I have given you their phone number in this packet). In the mean time you may use the hydrocortisone cream to help with symptoms. Take the benadryl you have at home as needed for itching as well.  Also, your blood pressure was high in the emergency room today. It appears that your blood pressure is often elevated. Please talk to your primary care provider for ongoing management of your blood pressure.

## 2015-08-24 NOTE — ED Provider Notes (Signed)
CSN: [id number]     Arrival date & time 08/24/15  [id number] History   First MD Initiated Contact with Patient 08/24/15 [phone]     Chief Complaint  Patient presents with  . Rash    HPI  [name] is an 49 y.o. female with history of hep C, HTN who presents to the ED for evaluation of rash. She has been seen in the ED multiple times for same complaint. Most recently she saw Dr. Lum BabeEniola (family med) who prescribed desonide. Pt reports that it initially relieved her symptoms but she is back because she is out of the cream and she feels like there are small clear hard objects coming out of her skin. Pt states that these objects are all over her backyard. She states she has had her house "de-bugged" and has cleaned her sheets and clothes thoroughly. States she has tried benadryl and lice shampoo with no relief. She states she is here because she wants more cream but cannot afford it at the pharmacy. Of note, pt denies all drug use to me but UDS was positive for cocaine and opioids at family medicine two days ago.   Past Medical History  Diagnosis Date  . Tuberculosis     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 age 49, now s/p 1-yr of multidrug treatment (dates unknown)  . Hypertension   . Hepatitis C antibody test positive   . ASCUS (atypical squamous cells of undetermined significance) on Pap smear   . Opioid dependence (HCC)   . Trichomonas 04/17/2011    Diagnosed 04/02/11 during hospitalization, treated with Flagyl 2g, patient instructed to have partner treated (must follow-up)    Past Surgical History  Procedure Laterality Date  . Cesarean section  1984  . Salpingectomy Left March 2010    ectopic pregnancy  . Dilation and curettage of uterus    . I&d extremity  10/18/2011    Procedure: IRRIGATION AND DEBRIDEMENT EXTREMITY;  Surgeon: Sharma CovertFred W Ortmann, MD;  [facility];  Service: Orthopedics;  Laterality: Left;  . I&d extremity  10/21/2011    Procedure: IRRIGATION AND DEBRIDEMENT EXTREMITY;   Surgeon: Sharma CovertFred W Ortmann, MD;  Location: [facility] OR;  Service: Orthopedics;  Laterality: Left;   Family History  Problem Relation Age of Onset  . Heart attack Mother   . Heart attack Father   . Heart attack Paternal Grandmother   . Heart attack Paternal Grandfather   . Cirrhosis Brother    Social History  Substance Use Topics  . Smoking status: Current Every Day Smoker -- 1.00 packs/day for 20 years  . Smokeless tobacco: Never Used  . Alcohol Use: No   OB History    No data available     Review of Systems  All other systems reviewed and are negative.     Allergies  Review of patient's allergies indicates no known allergies.  Home Medications   Prior to Admission medications   Medication Sig Start Date End Date Taking? Authorizing Provider  Aspirin-Acetaminophen (GOODY BODY PAIN) 500-325 MG PACK Take 1 packet by mouth every 8 (eight) hours as needed (pain).    Historical Provider, MD  desonide (DESOWEN) 0.05 % cream Apply topically 2 (two) times daily. 08/21/15   Doreene ElandKehinde T Eniola, MD  esomeprazole (NEXIUM) 40 MG capsule Take 1 capsule (40 mg total) by mouth daily before breakfast. 06/25/14   Ardith Darkaleb M Parker, MD  hydrochlorothiazide (HYDRODIURIL) 25 MG tablet TAKE ONE TABLET BY MOUTH ONCE DAILY 04/10/15   Ardith Darkaleb M Parker,  MD  HYDROcodone-acetaminophen (NORCO/VICODIN) 5-325 MG per tablet Take 1 tablet by mouth every 6 (six) hours as needed for moderate pain. Patient not taking: Reported on 08/17/2015 09/23/14   Charlestine Night, PA-C  hydrocortisone cream 1 % Apply 1 application topically 2 (two) times daily. Do not apply to face 08/10/15   [facility], PA-C  hydrOXYzine (ATARAX/VISTARIL) 25 MG tablet Take 1 tablet (25 mg total) by mouth every 6 (six) hours. 08/17/15   Barrett Henle, PA-C  ibuprofen (ADVIL,MOTRIN) 800 MG tablet TAKE ONE TABLET BY MOUTH EVERY 8 HOURS AS NEEDED 06/24/15   Ardith Dark, MD  lisinopril (PRINIVIL,ZESTRIL) 10 MG tablet Take 0.5 tablets (5 mg  total) by mouth daily. Patient taking differently: Take 10 mg by mouth daily.  03/07/15   Ardith Dark, MD  methadone (DOLOPHINE) 5 MG/5ML solution Take 50 mg by mouth every morning.     Historical Provider, MD  permethrin (PERMETHRIN LICE TREATMENT) 1 % lotion Apply 1 application topically once. Apply to affected areas. Rinse after 10 min; repeat in 1 week if needed Patient not taking: Reported on 08/17/2015 08/05/15   Arlyss Repress A Haney, MD   BP 211/115 mmHg  Pulse 87  Temp(Src) 98.1 F (36.7 C) (Oral)  Resp 18  SpO2 100%  LMP 08/19/2015 Physical Exam  Constitutional: She is oriented to person, place, and time.  HENT:  Right Ear: External ear normal.  Left Ear: External ear normal.  Nose: Nose normal.  Mouth/Throat: Oropharynx is clear and moist.  Eyes: Conjunctivae and EOM are normal. Pupils are equal, round, and reactive to light.  Neck: Normal range of motion. Neck supple.  Cardiovascular: Normal rate, regular rhythm and normal heart sounds.   Pulmonary/Chest: Effort normal and breath sounds normal. No respiratory distress.  Abdominal: Soft. Bowel sounds are normal. She exhibits no distension. There is no tenderness.  Musculoskeletal: She exhibits no edema.  Neurological: She is alert and oriented to person, place, and time. No cranial nerve deficit.  Skin: Skin is warm and dry.  Multiple diffuse excoriation marks and scabbed over lesions on arms, torso, and legs. No wounds, insect bites, papules or vesicles noted. No foreign bodies.   Psychiatric: Her mood appears anxious. Her speech is rapid and/or pressured.  Nursing note and vitals reviewed.   ED Course  Procedures (including critical care time) Labs Review Labs Reviewed - No data to display  Imaging Review No results found. I have personally reviewed and evaluated these images and lab results as part of my medical decision-making.   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 I tried to talk to pt about drug use but she vehemently denies drug use today. Her vital signs and physical exam are otherwise unremarkable. She insists that we give her more desonide cream here. I will order 0.5% hydrocortisone and give vistaril here for itching. Pt states she has benadryl still at home and does not need more.   Upon discharge pt does want rx for desonide and vistaril. Encouraged PCP follow-up.    Carlene Coria, PA-C 08/24/15 1953  Gerhard Munch, MD 08/25/15 [phone]

## 2015-08-30 ENCOUNTER — Telehealth: Payer: Self-pay | Admitting: *Deleted

## 2015-08-30 MED ORDER — TRIAMCINOLONE ACETONIDE 0.1 % EX CREA: 1.0000 "application " | TOPICAL_CREAM | Freq: Two times a day (BID) | CUTANEOUS | Status: DC

## 2015-08-30 NOTE — Telephone Encounter (Signed)
[facility]  Called [facility] and gave verbally. Ariana Jensen, Ariana RochesterJessica Jensen

## 2015-08-30 NOTE — Telephone Encounter (Signed)
Pharmac[facility] from [facility] called requesting to change Triamcinolone 0.5% cream to 0.1% cream.  The 0.5% cream is not working for the patient.  Clovis PuMartin, Valla Pacey L, RN

## 2015-08-30 NOTE — Telephone Encounter (Signed)
New rx sent in.   Ariana Degreealeb M. Jimmey RalphParker, MD [facility] Resident PGY-2 08/30/2015 12:06 PM

## 2015-09-02 ENCOUNTER — Ambulatory Visit: Payer: No Typology Code available for payment source

## 2015-09-10 ENCOUNTER — Encounter: Payer: Self-pay | Admitting: Family Medicine

## 2015-09-10 ENCOUNTER — Ambulatory Visit (INDEPENDENT_AMBULATORY_CARE_PROVIDER_SITE_OTHER): Payer: Self-pay | Admitting: Family Medicine

## 2015-09-10 VITALS — BP 194/91 | HR 77 | Temp 98.0°F | Ht 63.0 in | Wt 139.7 lb

## 2015-09-10 DIAGNOSIS — R21 Rash and other nonspecific skin eruption: Secondary | ICD-10-CM

## 2015-09-10 DIAGNOSIS — F1910 Other psychoactive substance abuse, uncomplicated: Secondary | ICD-10-CM

## 2015-09-10 DIAGNOSIS — I10 Essential (primary) hypertension: Secondary | ICD-10-CM

## 2015-09-10 DIAGNOSIS — L299 Pruritus, unspecified: Secondary | ICD-10-CM

## 2015-09-10 MED ORDER — LISINOPRIL 20 MG PO TABS: 20.0000 mg | ORAL_TABLET | Freq: Every day | ORAL | Status: DC

## 2015-09-10 MED ORDER — CLONIDINE HCL 0.1 MG PO TABS
0.1000 mg | ORAL_TABLET | Freq: Once | ORAL | Status: AC
  Administered 2015-09-10: 0.1 mg via ORAL

## 2015-09-10 MED ORDER — DESONIDE 0.05 % EX CREA: TOPICAL_CREAM | Freq: Two times a day (BID) | CUTANEOUS | Status: DC

## 2015-09-10 MED ORDER — HYDROXYZINE HCL 25 MG PO TABS: 25.0000 mg | ORAL_TABLET | Freq: Three times a day (TID) | ORAL | Status: DC | PRN

## 2015-09-10 MED ORDER — GABAPENTIN 100 MG PO CAPS: 100.0000 mg | ORAL_CAPSULE | Freq: Three times a day (TID) | ORAL | Status: DC

## 2015-09-10 NOTE — Progress Notes (Signed)
Subjective:    Patient ID: Ariana Jensen, female    DOB: Oct 31, 1965, 49 y.o.   MRN: [id number]  Ariana Jensen is a 49 y.o. female presenting on 09/10/2015 for itching all over  Patient presents for a same day appointment.  HPI  RASH, PRURITUS, BUG BITES: - Reports symptoms started 2 months ago around October with generalized itching, found bug bites all over. Treated OTC lice therapy permerthin, then arrived 11/14 treated for possible lice, ED multiple times x 4, then scheduled with [facility] Derm clinic. She was advised that this could be "Psychogenic Pruritus" by several providers. - in interval history she had pest control come out to treat apartment they saw them but didn't know what it is, saw "little clear bugs", moved at end of November 2016, threw everything away, bed clothes, mattress. Moved to new apartment new clothing. Still has bugs in same apartment by her report. - Today she reviews current symptoms with persistent "feeling of bugs on her skin", rash with scabs on arms, legs, behind ears itches and she continues to scratch it, associated with burning and pain, difficulty sleeping. - Treated with multiple topical steroids including Desonide which helped most, also taking VIstarail for itching with some relief, out today requesting refills - Of note, last visit to [facility] 11/30 Derm clinic, further lab work-up done with normal BMET, TSH, HIV, but UDS was positive for cocaine, and she was advised that this could cause the symptom of itching and feeling bugs. She states last use cocaine was 1 week ago but still has symptoms.  CHRONIC HTN: Reports history of uncontrolled BP recently has been elevated in recent visits over past few weeks to 150 to 190s, up to 217/102 on 11/28, she admits to stress and pain from current situation. Current Meds - HCTZ  daily, LIsinopril  daily   Reports good compliance, took meds today. Tolerating well, w/o complaints. Denies CP, dyspnea, HA,  edema, dizziness / lightheadedness  Currently does not have insurance, but awaiting renewal of orange card, cone financial assistance, ran out last week, bring in tax form Friday to get renewed.   Past Medical History  Diagnosis Date  . Tuberculosis     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 age 49, now s/p 1-yr of multidrug treatment (dates unknown)  . Hypertension   . Hepatitis C antibody test positive   . ASCUS (atypical squamous cells of undetermined significance) on Pap smear   . Opioid dependence (HCC)   . Trichomonas 04/17/2011    Diagnosed 04/02/11 during hospitalization, treated with Flagyl 2g, patient instructed to have partner treated (must follow-up)     Social History   Social History  . Marital Status: Married    Spouse Name: N/A  . Number of Children: 1  . Years of Education: 7th grade   Occupational History  . Not on file.   Social History Main Topics  . Smoking status: Current Every Day Smoker -- 1.00 packs/day for 20 years  . Smokeless tobacco: Never Used  . Alcohol Use: No  . Drug Use: No  . Sexual Activity: Yes    Birth Control/ Protection: None   Other Topics Concern  . Not on file   Social History Narrative   Lives in [place]   Currently Unemployeed    Current Outpatient Prescriptions on File Prior to Visit  Medication Sig  . Aspirin-Acetaminophen (GOODY BODY PAIN) 500-325 MG PACK Take 1 packet by mouth every 8 (eight) hours as needed (pain).  Marland Kitchen esomeprazole (NEXIUM)  40 MG capsule Take 1 capsule (40 mg total) by mouth daily before breakfast.  . hydrochlorothiazide (HYDRODIURIL) 25 MG tablet TAKE ONE TABLET BY MOUTH ONCE DAILY  . hydrocortisone cream 1 % Apply 1 application topically 2 (two) times daily. Do not apply to face  . methadone (DOLOPHINE) 5 MG/5ML solution Take 50 mg by mouth every morning.    Current Facility-Administered Medications on File Prior to Visit  Medication  . hepatitis A virus (PF) vaccine (HAVRIX (PF)) 1440 EL U/ML  injection 1,440 Units    Review of Systems Per HPI unless specifically indicated above     Objective:    BP 194/91 mmHg  Pulse 77  Temp(Src) 98 F (36.7 C) (Oral)  Ht 5\' 3"  (1.6 m)  Wt 139 lb 11.2 oz (63.368 kg)  BMI 24.75 kg/m2  LMP 08/19/2015  Wt Readings from Last 3 Encounters:  09/10/15 139 lb 11.2 oz (63.368 kg)  08/21/15 141 lb 9 oz (64.212 kg)  08/05/15 148 lb 11.2 oz (67.45 kg)    Physical Exam  Constitutional: She is oriented to person, place, and time. She appears well-developed and well-nourished. No distress.  Anxious, chronically ill appearing older than stated age  HENT:  Head: Normocephalic and atraumatic.  Mouth/Throat: Oropharynx is clear and moist.  Neck: Neck supple.  Cardiovascular: Normal rate, regular rhythm, normal heart sounds and intact distal pulses.   No murmur heard. Musculoskeletal: Normal range of motion. She exhibits no edema or tenderness.  Neurological: She is alert and oriented to person, place, and time.  Skin: Skin is warm and dry. Rash noted. She is not diaphoretic.  Scattered multiple 0.5 to 1cm irregular scabs with scratch marks on bilateral arms, legs, face and behind ears. Various stages of healing, no surrounding erythema. No lesions on hands or feet.  Psychiatric: Thought content normal.  Anxious and pressured speech, occasionally tearful.  Nursing note and vitals reviewed.  Results for orders placed or performed in visit on 08/21/15  HIV antibody (with reflex)  Result Value Ref Range   HIV 1&2 Ab, 4th Generation NONREACTIVE NONREACTIVE  COMPLETE METABOLIC PANEL WITH GFR  Result Value Ref Range   Sodium 135 135 - 146 mmol/L   Potassium 4.0 3.5 - 5.3 mmol/L   Chloride 100 98 - 110 mmol/L   CO2 23 20 - 31 mmol/L   Glucose, Bld 93 65 - 99 mg/dL   BUN 14 7 - 25 mg/dL   Creat 1.610.85 0.960.50 - 0.451.10 mg/dL   Total Bilirubin 0.3 0.2 - 1.2 mg/dL   Alkaline Phosphatase 65 33 - 115 U/L   AST 22 10 - 35 U/L   ALT 12 6 - 29 U/L   Total  Protein 7.9 6.1 - 8.1 g/dL   Albumin 3.9 3.6 - 5.1 g/dL   Calcium 9.4 8.6 - 40.910.2 mg/dL   GFR, Est African American >89 >=60 mL/min   GFR, Est Non African American 82 >=60 mL/min  TSH  Result Value Ref Range   TSH 0.807 0.350 - 4.500 uIU/mL  Drug Screen, Urine  Result Value Ref Range   Benzodiazepines. NEG Negative   Phencyclidine (PCP) NEG Negative   Cocaine Metabolites POS (A) Negative   Amphetamine Screen, Ur NEG Negative   Marijuana Metabolite NEG Negative   Opiates NEG Negative   Barbiturate Quant, Ur NEG Negative   Methadone POS (A) Negative   Propoxyphene NEG Negative   Creatinine,U 122.71 mg/dL      Assessment & Plan:   Problem List  Items Addressed This Visit    HTN (hypertension)    Uncontrolled HTN, BP today initially elevated on electronic check to >200/100. Manual re-check SBP 190. Clinically non-focal neuro intact, without headache, stable from multiple prior elevated readings in past few weeks, with pain and stress. - Cocaine Positive UDS last visit No complications   Plan:  1. Given clonidine 0.1mg  x 1 in office, improved blood pressure to 194/91 2. Increased Lisinopril to  daily (sent new rx), continue HCTZ  daily 3. Recent labs checked stable Cr 4. Discontinue Cocaine - likely contributing to elevated BP 5. 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 Rash appears to be healing scratches. Home environment changed completely with new clothes, and previously had exterminator, also multiple treatments permethrin, unlikely athropod / bug bites at this time. - Improved on desnoide and vistaril  Plan: 1. Counseled on discontinue cocaine to reduce itching / bug sensation 2.  Refill Desonide topical, refilled Vistaril 3. Referral to Dermatology for re-evaluation given persistence and repeat ED evaluations for same problem, pending orange card renewal      Relevant Medications   hydrOXYzine (ATARAX/VISTARIL) 25 MG tablet   gabapentin (NEURONTIN) 100 MG capsule   desonide (DESOWEN) 0.05 % cream   Other Relevant Orders   Ambulatory referral to Dermatology   Rash and nonspecific skin eruption   Relevant Medications   desonide (DESOWEN) 0.05 % cream   Other Relevant Orders   Ambulatory referral to Dermatology      Meds ordered this encounter  Medications  . cloNIDine (CATAPRES) tablet 0.1 mg    Sig:   . hydrOXYzine (ATARAX/VISTARIL) 25 MG tablet    Sig: Take 1 tablet (25 mg total) by mouth every 8 (eight) hours as needed for itching.    Dispense:  60 tablet    Refill:  1  . DISCONTD: desonide (DESOWEN) 0.05 % cream    Sig: Apply topically 2 (two) times daily.    Dispense:  30 g    Refill:  1  . lisinopril (PRINIVIL,ZESTRIL) 20 MG tablet    Sig: Take 1 tablet (20 mg total) by mouth daily.    Dispense:  90 tablet    Refill:  0  . gabapentin (NEURONTIN) 100 MG capsule    Sig: Take 1 capsule (100 mg total) by mouth 3 (three) times daily.    Dispense:  90 capsule    Refill:  0  . desonide (DESOWEN) 0.05 % cream    Sig: Apply topically 2 (two) times daily.    Dispense:  30 g    Refill:  1      Follow up plan: Return in about 1 week (around 09/17/2015) for blood pressure.  Saralyn Pilar, DO [facility], PGY-3

## 2015-09-10 NOTE — Patient Instructions (Addendum)
Your blood pressure is elevated still >190/90 You received one extra BP pill today to helped a little Stress and anxiety will raise your BP. Also concern with drug use this can raise pressure. Sent new rx to increase Lisinopril to 20mg  daily - sent to pharmacy, take once a day Check BP outside of office at drug store and write down numbers Keep taking Hydrochlorothiazide every day 25mg   For rash and bug bites, refilled Desonide and refilled Vistaril / hydroxyzine. - Referral sent to Dermatologist (outside this office, [facility] before this can be processed) - Start Gabapentin 100mg  capsules nightly for few nights then go to 3 times a day for burning / itching sensations, may make you drowsy  Please schedule a follow-up appointment with Dr Jimmey RalphParker in 1 week for BP follow-up if you can't make this appointment, then recommend a Nurse Visit to check BP.  You will be called by Dermatologist within next few weeks to month. If you have not heard you need to follow-up or contact us within 1 month for follow-up on Rash and itching.  If you have any other questions or concerns, please feel free to call the clinic to contact me. You may also schedule an earlier appointment if necessary.  However, if your symptoms get significantly worse, please go to the Emergency Department to seek immediate medical attention.  Saralyn PilarAlexander Jabree Rebert, DO [facility]

## 2015-09-11 NOTE — Assessment & Plan Note (Signed)
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 Rash appears to be healing scratches. Home environment changed completely with new clothes, and previously had exterminator, also multiple treatments permethrin, unlikely athropod / bug bites at this time. - Improved on desnoide and vistaril  Plan: 1. Counseled on discontinue cocaine to reduce itching / bug sensation 2. Refill Desonide topical, refilled Vistaril 3. Referral to Dermatology for re-evaluation given persistence and repeat ED evaluations for same problem, pending orange card renewal

## 2015-09-11 NOTE — Assessment & Plan Note (Signed)
Uncontrolled HTN, BP today initially elevated on electronic check to >200/100. Manual re-check SBP 190. Clinically non-focal neuro intact, without headache, stable from multiple prior elevated readings in past few weeks, with pain and stress. - Cocaine Positive UDS last visit No complications   Plan:  1. Given clonidine 0.1mg  x 1 in office, improved blood pressure to 194/91 2. Increased Lisinopril to 20mg  daily (sent new rx), continue HCTZ 25mg  daily 3. Recent labs checked stable Cr 4. Discontinue Cocaine - likely contributing to elevated BP 5. Follow-up 1 week for BP re-check

## 2015-09-17 ENCOUNTER — Ambulatory Visit: Payer: No Typology Code available for payment source

## 2015-09-20 ENCOUNTER — Ambulatory Visit: Payer: No Typology Code available for payment source

## 2015-10-03 ENCOUNTER — Ambulatory Visit: Payer: No Typology Code available for payment source | Admitting: Family Medicine

## 2015-10-04 ENCOUNTER — Encounter: Payer: Self-pay | Admitting: Student

## 2015-10-04 ENCOUNTER — Ambulatory Visit (INDEPENDENT_AMBULATORY_CARE_PROVIDER_SITE_OTHER): Payer: Self-pay | Admitting: Student

## 2015-10-04 VITALS — BP 160/56 | HR 80 | Temp 98.3°F | Resp 18 | Ht 63.0 in | Wt 133.0 lb

## 2015-10-04 DIAGNOSIS — R110 Nausea: Secondary | ICD-10-CM

## 2015-10-04 DIAGNOSIS — F1910 Other psychoactive substance abuse, uncomplicated: Secondary | ICD-10-CM

## 2015-10-04 DIAGNOSIS — L299 Pruritus, unspecified: Secondary | ICD-10-CM

## 2015-10-04 DIAGNOSIS — F1410 Cocaine abuse, uncomplicated: Secondary | ICD-10-CM

## 2015-10-04 DIAGNOSIS — R112 Nausea with vomiting, unspecified: Secondary | ICD-10-CM | POA: Insufficient documentation

## 2015-10-04 MED ORDER — ONDANSETRON HCL 4 MG PO TABS: 4.0000 mg | ORAL_TABLET | Freq: Three times a day (TID) | ORAL | Status: DC | PRN

## 2015-10-04 MED ORDER — HYDROXYZINE HCL 25 MG PO TABS: 25.0000 mg | ORAL_TABLET | Freq: Three times a day (TID) | ORAL | Status: DC | PRN

## 2015-10-04 NOTE — Assessment & Plan Note (Signed)
Now resolved nausea for the last 3-4 days. Unclear whether she has had associated emesis due to the patient changing her history. No fevers or diarrhea making gastro enteritis less likely, less likey ingestion given

## 2015-10-04 NOTE — Assessment & Plan Note (Signed)
[facility] per report, however she continues to use cocaine. UDS attempted today but could not be analyzed due to too small of a sample left - continue to address cocaine use and encourage cessation

## 2015-10-04 NOTE — Assessment & Plan Note (Signed)
Rash with open sore from chronic picking, Concern for psychogenic cause of pruritis as well as likely contribution from cocaine use.  - Will continue vistaril for itching - a derm referral has been made for her at previous visit and is pending orange card activation - UDS was attempted but sample that was left was too small for evaluation

## 2015-10-04 NOTE — Progress Notes (Signed)
   Subjective:    Patient ID: Ariana Jensen, female    DOB: 29-May-1966, 50 y.o.   MRN: [id number]   CC: itching over arms, nausea for the last 3-4 days  HPI: 50 y/o presenting for pruritis, nausea over the last 3-4 days   Of note pt is a poor historian, changing details regarding her symptoms several times during the visit   Nausea - Pt reports nausea for the last 3-4 days.  - She initially denies emesis, then stated she has emesis 1-2 times per day, typically after a meal, but not after every meal - She denies fever, diarrhea - she has been overall able to tolerate normal PO - denies having any nausea today - can think of no inciting factors to cause her nausea  Itching - Has been present for several months and has been seen in this office for this twice before this visit  - She reports she continues to have worse pruritis at night - continues to seeclear lice on bed linens, despite changing rooms, bed linens - is unsure if any one else in the group where she lives has had itching or complaints of lice as she does not speak to them regularly  Cocaine Use - - She has had a UDS + for cocaine in 07/2015 - She initially stated she last used cocaine 1 week ago then after further questioning about this, she stated she had not used cocaine since prior to her last visit on 12/20. - she then stated she is not a regular user, only uses it occasionally  Review of Systems ROS  Per HPI else denies headache, changes in vision, chest pain, SOB, weakness  Past Medical, Surgical, Social, and Family History Reviewed & Updated per EMR.   Objective:  BP 160/56 mmHg  Pulse 80  Temp(Src) 98.3 F (36.8 C) (Oral)  Resp 18  Ht 5\' 3"  (1.6 m)  Wt 133 lb (60.328 kg)  BMI 23.57 kg/m2  SpO2 100%  LMP 09/27/2014 Vitals and nursing note reviewed  General: anxious appearing, otherwise NAD Cardiac: RRR,  Respiratory: CTAB, normal effort Skin:  Scattered open lesions each approx 1 cm in  greatest diameter over bilateral arms, few on back else no lesions appreciated  Neuro: alert and oriented, no focal deficits   Assessment & Plan:    Nausea Now resolved nausea for the last 3-4 days. Unclear whether she has had associated emesis due to the patient changing her history. No fevers or diarrhea making gastro enteritis less likely, less likey ingestion given   Pruritic dermatitis Rash with open sore from chronic picking, Concern for psychogenic cause of pruritis as well as likely contribution from cocaine use.  - Will continue vistaril for itching - a derm referral has been made for her at previous visit and is pending orange card activation - UDS was attempted but sample that was left was too small for evaluation   HTN (hypertension) Blood presure initially elevated to 160/106 but on repeat went to 160/56.Lisinopril increased at last visit to 20 mg qD - Will follow with PCP for hypertension   Polysubstance abuse Pt on [facility] per report, however she continues to use cocaine. UDS attempted today but could not be analyzed due to too small of a sample left - continue to address cocaine use and encourage cessation     Alyssa A. Kennon RoundsHaney MD, MS Family Medicine Resident PGY-2 Pager [phone]

## 2015-10-04 NOTE — Patient Instructions (Signed)
Follow up with PCP in 1 months A referral was made to dermatology for your rash Please schedule a dentist appointment as soon as possible  If you have any questions or concerns, please call the office at [phone]

## 2015-10-04 NOTE — Assessment & Plan Note (Addendum)
Blood presure initially elevated to 160/106 but on repeat went to 160/56.Lisinopril increased at last visit to 20 mg qD - Will follow with PCP for hypertension

## 2015-10-09 ENCOUNTER — Ambulatory Visit: Payer: Self-pay

## 2015-10-10 ENCOUNTER — Other Ambulatory Visit: Payer: Self-pay | Admitting: Family Medicine

## 2015-10-10 ENCOUNTER — Telehealth: Payer: Self-pay | Admitting: Family Medicine

## 2015-10-10 MED ORDER — IBUPROFEN 800 MG PO TABS: 800.0000 mg | ORAL_TABLET | Freq: Three times a day (TID) | ORAL | Status: DC | PRN

## 2015-10-10 NOTE — Telephone Encounter (Signed)
Pt would like 800 mg Ibuprofen for menstrual cramps. Sadie Reynolds, ASA

## 2015-10-10 NOTE — Telephone Encounter (Signed)
Pt calling to check the status of her dermatology referral. Please fu with pt. Ariana Jensen, ASA

## 2015-10-10 NOTE — Telephone Encounter (Signed)
Spoke with patient and informed her that we were waiting for her to complete for OC application.  Patient states that she came in yesterday and was able to renew it then.  Will forward to referral coordinator. Cassadi Purdie,CMA

## 2015-10-10 NOTE — Telephone Encounter (Signed)
Rx filled.  Ariana M. JimmeyKatina Degreeph, MD [facility] Resident PGY-2 10/10/2015 11:02 AM

## 2015-10-10 NOTE — Telephone Encounter (Signed)
Pt renewed. OC 10/09/15 TO 04/07/16. [facility] referral form completed and will be sent to [facility] in February. Pt is aware she will receive appt from Stacy at [facility]

## 2015-10-28 ENCOUNTER — Ambulatory Visit (INDEPENDENT_AMBULATORY_CARE_PROVIDER_SITE_OTHER): Payer: No Typology Code available for payment source | Admitting: Family Medicine

## 2015-10-28 VITALS — BP 186/94 | HR 78 | Temp 98.5°F | Ht 63.0 in | Wt 138.2 lb

## 2015-10-28 DIAGNOSIS — K429 Umbilical hernia without obstruction or gangrene: Secondary | ICD-10-CM

## 2015-10-28 DIAGNOSIS — I10 Essential (primary) hypertension: Secondary | ICD-10-CM

## 2015-10-28 DIAGNOSIS — R0600 Dyspnea, unspecified: Secondary | ICD-10-CM

## 2015-10-28 NOTE — Progress Notes (Signed)
Patient ID: Ariana Jensen, female   DOB: Aug 30, 1966, 50 y.o.   MRN: [id number]   Subjective:  This history was provided by the patient.  Ariana Jensen is a 50 y.o. female who  has a past medical history of Tuberculosis; Hypertension; Hepatitis C antibody test positive; ASCUS (atypical squamous cells of undetermined significance) on Pap smear; Opioid dependence (HCC); and Trichomonas (04/17/2011).    Patient presents for "lump on belly button."  Patient states she first noticed a small bump on the side of her umbilicus approximately 1 week ago.  Patient states over this past weekend, the area grew progressively larger.  Patient denies pain or drainage from area.  Patient also complains of occasional LLQ pain that is worse with palpation and certain movements.    Patient also states she has noticed intermittent shortness of breath with exertion over past week.  Patient states she has no history of same.  Patient endorses occasional non-productive cough and states, "I think that is because I smoke."  Patient denies chest pain, weakness, diaphoresis and dizziness with exertion.  Patient denies fever, vaginal bleeding/discharge, dysuria, hematuria, dizziness, N/V/D, but endorses constipation.  Patient states she has not had a productive BM in 3 days.  She has taken OTC laxatives with no relief, but has not treated SOB or mass on umbilicus.  Review of Systems:  Per HPI. All other systems reviewed and are negative.   PMH, PSH, Medications, Allergies, and FmHx reviewed and updated in EMR.  Social History: Current every day smoker  Objective:  BP 186/94 mmHg  Pulse 78  Temp(Src) 98.5 F (36.9 C) (Oral)  Ht  (1.6 m)  Wt 138 lb 3.2 oz (62.687 kg)  BMI 24.49 kg/m2  LMP 10/21/2014 (Approximate)  Gen:  50 y.o. female in NAD.  Pleasant and interactive, non-toxic in appearance.   CV: RRR, S1/S2, no murmur, rub or gallop, no JVD.  +2 radial and dorsalis pedis pulses bilaterally Resp:  Non-labored, no wheezes, crackles or rales noted, occasional rhonchi that clear with cough. No dyspnea or tachypnea noted.  Ambulatory without dyspnea in clinic. Abd: Soft, not distended.  Mild tenderness to LLQ palpation.  Small (3 cm) umbilical hernia palpated to right lateral umbilicus.  Not painful to palpation.  Hernia is easily reducible and not red or purple in color,  consistent with patient's skin tone.  Hernia does not become more prominent with valsalva maneuver.   Neuro: Alert and oriented, speech normal     Assessment & Plan:     Ariana Jensen is a 50 y.o. female here for mass along umbilical wall that is suggestive of an umbilical hernia.  Hernia is easily reduced and patient denies pain around hernia, fever and N/V.  Doubt incarceration.  Patient's exertional dyspnea is puzzling as she is afebrile and respiratory exam is unremarkable.  Patient is a daily smoker with history of TB.  Will continue to monitor and have patient return as needed.  Patient's BP in clinic was elevated at 186/94 despite using her anti-hypertensive medications as prescribed.  Discussed this with patient and she is to schedule a follow-up appointment with her PCP for 2 weeks from today to have BP rechecked.  1. Umbilical hernia without obstruction and without gangrene - Ambulatory referral to General Surgery  2.  Exertional Dyspnea - Encouraged patient to pursue smoking cessation to improve respiratory status.  Patient is to follow-up with PCP in 2 weeks regarding blood pressure, will reassess respiratory status then.  If patient's dyspnea  worsens, she develops a fever >100.4, a productive cough or hemoptysis, she should seek follow-up care sooner than 2 weeks.  3.  Hypertension - Follow-up with PCP in 2 weeks to have BP rechecked.  Until then, patient encouraged to take her anti-hypertensive medications as prescribed.    Nelly Rout, NP Student [facility] 10/28/2015 4:58 PM   RESIDENT  ADDENDUM I have separately seen and examined the patient. I have discussed the findings and exam with NP student and agree with the above note. Additionally I have outlined my exam and assessment/plan below:  PE: General: no apparent distress  CV: normal rate, regular rhythm, no murmurs, rubs or gallop. Resp: clear to auscultation bilaterally, normal effort. No wheezes, rhonchi or crackles  Abd: obese, soft. There is a small right sided umbilical hernia, reducible Skin: stretch marks over abdomen  Limited bedside U/S seems to be consistent with umbilical hernia  A/P: Umbilical hernia - small and reducible. referral to General Surgery as patient would like to have surgical options discussed Dyspnea - reassuring she has had cardiology evaluation with stress testing in past few months. Lung and heart exam is normal today. Probable smoking and deconditioning component. She additionally says she has had a cough so may have mild bronchitis/viral infection. Recommended follow up if not improving or worsens over next week Hypertension - uncontrolled in clinic today. Stated she did take her medicines. Asked her to schedule appointment with PCP for additional management.  Tawni Carnes, MD 10/28/2015, 11:43 PM PGY-3, [facility] FPTS Intern Pager: [phone], text pages welcome

## 2015-10-28 NOTE — Patient Instructions (Signed)
We will make a referral to the general surgeons so you can speak with them about the possible umbilical hernia.  Your blood pressure was very high today. Please make sure you take your medicines every single day and schedule a follow up appointment with Dr. [name] for sometime in the next 1-2 weeks.

## 2015-11-01 ENCOUNTER — Encounter (HOSPITAL_COMMUNITY): Payer: Self-pay | Admitting: Oncology

## 2015-11-01 ENCOUNTER — Emergency Department (HOSPITAL_COMMUNITY)
Admission: EM | Admit: 2015-11-01 | Discharge: 2015-11-02 | Disposition: A | Discharge status: Home / Self Care | Payer: No Typology Code available for payment source | Attending: Emergency Medicine | Admitting: Emergency Medicine

## 2015-11-01 ENCOUNTER — Emergency Department (HOSPITAL_COMMUNITY): Payer: No Typology Code available for payment source

## 2015-11-01 DIAGNOSIS — R079 Chest pain, unspecified: Secondary | ICD-10-CM | POA: Insufficient documentation

## 2015-11-01 DIAGNOSIS — R0602 Shortness of breath: Secondary | ICD-10-CM | POA: Insufficient documentation

## 2015-11-01 DIAGNOSIS — Z8619 Personal history of other infectious and parasitic diseases: Secondary | ICD-10-CM | POA: Insufficient documentation

## 2015-11-01 DIAGNOSIS — F419 Anxiety disorder, unspecified: Secondary | ICD-10-CM | POA: Insufficient documentation

## 2015-11-01 DIAGNOSIS — F17200 Nicotine dependence, unspecified, uncomplicated: Secondary | ICD-10-CM | POA: Insufficient documentation

## 2015-11-01 DIAGNOSIS — I10 Essential (primary) hypertension: Secondary | ICD-10-CM | POA: Insufficient documentation

## 2015-11-01 DIAGNOSIS — R51 Headache: Secondary | ICD-10-CM | POA: Insufficient documentation

## 2015-11-01 DIAGNOSIS — Z8611 Personal history of tuberculosis: Secondary | ICD-10-CM | POA: Insufficient documentation

## 2015-11-01 DIAGNOSIS — Z7952 Long term (current) use of systemic steroids: Secondary | ICD-10-CM | POA: Insufficient documentation

## 2015-11-01 DIAGNOSIS — Z79899 Other long term (current) drug therapy: Secondary | ICD-10-CM | POA: Insufficient documentation

## 2015-11-01 NOTE — ED Notes (Signed)
Pt has c/o Shob, chest pain and waking up seeing, "little white bugs"  All over her.  Pt is anxious in triage.  Dr. Fredderick Phenix at bedside to assess pt.

## 2015-11-01 NOTE — ED Provider Notes (Signed)
CSN: [id number]     Arrival date & time 11/01/15  2313 History  By signing my name below, I, [name], attest that this documentation has been prepared under the direction and in the presence of [name], MD. Electronically Signed: [name], ED Scribe. 11/01/2015. 12:14 AM.   Chief Complaint  Patient presents with  . Chest Pain    Ariana Jensen   The history is provided by the patient. No language interpreter was used.   HPI Comments: Ariana Jensen is a 50 y.o. female who presents to the Emergency Department complaining of sudden onset, worsening, SOB onset with waking up PTA. Pt associates the feeling that her throat is closing up and upper mid chest pain onset 9 hours ago. The pt also reports waking up to small, clear insects in her bed, nose, and mouth. She was seen by a dermatologist 1 day ago who prescribed triamcinolone for a pruritic rash.  She has also been taking benadryl over the course of the pruritic rash, which she took this evening without relief. She denies experiencing panic attacks in the past, h/o respiratory issues, or cardiac history.  She feels like the bugs are crawling in her mouth and nose leading to the SOB and throat swelling.   Past Medical History  Diagnosis Date  . Tuberculosis     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 age 28, now s/p 1-yr of multidrug treatment (dates unknown)  . Hypertension   . Hepatitis C antibody test positive   . ASCUS (atypical squamous cells of undetermined significance) on Pap smear   . Opioid dependence (HCC)   . Trichomonas 04/17/2011    Diagnosed 04/02/11 during hospitalization, treated with Flagyl 2g, patient instructed to have partner treated (must follow-up)    Past Surgical History  Procedure Laterality Date  . Cesarean section  1984  . Salpingectomy Left March 2010    ectopic pregnancy  . Dilation and curettage of uterus    . I&d extremity  10/18/2011    Procedure: IRRIGATION AND DEBRIDEMENT EXTREMITY;   Surgeon: Sharma Covert, MD;  Location: [facility] OR;  Service: Orthopedics;  Laterality: Left;  . I&d extremity  10/21/2011    Procedure: IRRIGATION AND DEBRIDEMENT EXTREMITY;  Surgeon: Sharma Covert, MD;  Location: [facility] OR;  Service: Orthopedics;  Laterality: Left;   Family History  Problem Relation Age of Onset  . Heart attack Mother   . Heart attack Father   . Heart attack Paternal Grandmother   . Heart attack Paternal Grandfather   . Cirrhosis Brother    Social History  Substance Use Topics  . Smoking status: Current Every Day Smoker -- 0.50 packs/day for 20 years  . Smokeless tobacco: Never Used  . Alcohol Use: No   OB History    No data available     Review of Systems  Constitutional: Negative for fever, chills, diaphoresis and fatigue.  HENT: Negative for congestion, rhinorrhea and sneezing.   Eyes: Negative.   Respiratory: Positive for shortness of breath. Negative for cough and chest tightness.   Cardiovascular: Positive for chest pain. Negative for leg swelling.  Gastrointestinal: Negative for nausea, vomiting, abdominal pain, diarrhea and blood in stool.  Genitourinary: Negative for frequency, hematuria, flank pain and difficulty urinating.  Musculoskeletal: Negative for back pain and arthralgias.  Skin: Positive for rash.  Neurological: Negative for dizziness, speech difficulty, weakness, numbness and headaches.      Allergies  Review of patient's allergies indicates no known allergies.  Home Medications  Prior to Admission medications   Medication Sig Start Date End Date Taking? Authorizing Provider  gabapentin (NEURONTIN) 100 MG capsule Take 1 capsule (100 mg total) by mouth 3 (three) times daily. 09/10/15  Yes Alexander J Karamalegos, DO  hydrochlorothiazide (HYDRODIURIL) 25 MG tablet TAKE ONE TABLET BY MOUTH ONCE DAILY 04/10/15  Yes Ardith Dark, MD  hydrOXYzine (ATARAX/VISTARIL) 25 MG tablet Take 1 tablet (25 mg total) by mouth every 8 (eight) hours as needed  for itching. 10/04/15  Yes Alyssa A Kennon Rounds, MD  ibuprofen (ADVIL,MOTRIN) 800 MG tablet Take 1 tablet (800 mg total) by mouth every 8 (eight) hours as needed. 10/10/15  Yes Ardith Dark, MD  lisinopril (PRINIVIL,ZESTRIL) 20 MG tablet Take 1 tablet (20 mg total) by mouth daily. 09/10/15  Yes Smitty Cords, DO  methadone (DOLOPHINE) 5 MG/5ML solution Take 50 mg by mouth every morning.    Yes Historical Provider, MD  ondansetron (ZOFRAN) 4 MG tablet Take 1 tablet (4 mg total) by mouth every 8 (eight) hours as needed for nausea or vomiting. 10/04/15  Yes Alyssa A Haney, MD  triamcinolone cream (KENALOG) 0.1 % Apply 1 application topically 2 (two) times daily.   Yes Historical Provider, MD  desonide (DESOWEN) 0.05 % cream Apply topically 2 (two) times daily. Patient not taking: Reported on 11/01/2015 09/10/15   Smitty Cords, DO  esomeprazole (NEXIUM) 40 MG capsule Take 1 capsule (40 mg total) by mouth daily before breakfast. Patient not taking: Reported on 11/01/2015 06/25/14   Ardith Dark, MD  hydrocortisone cream 1 % Apply 1 application topically 2 (two) times daily. Do not apply to face Patient not taking: Reported on 11/01/2015 08/10/15   Joni Reining Pisciotta, PA-C   BP 160/90 mmHg  LMP 10/21/2014 (Approximate) Physical Exam  Constitutional: She is oriented to person, place, and time. She appears well-developed and well-nourished.  Appears anxious.   HENT:  Head: Normocephalic and atraumatic.  No visible airway swelling or angioedema.  Eyes: Pupils are equal, round, and reactive to light.  Neck: Normal range of motion. Neck supple.  Cardiovascular: Normal rate, regular rhythm and normal heart sounds.   Pulmonary/Chest: Effort normal and breath sounds normal. No respiratory distress. She has no wheezes. She has no rales. She exhibits no tenderness.  No stridor.   Abdominal: Soft. Bowel sounds are normal. There is no tenderness. There is no rebound and no guarding.   Musculoskeletal: Normal range of motion. She exhibits no edema.  No edema or calf tenderness.   Lymphadenopathy:    She has no cervical adenopathy.  Neurological: She is alert and oriented to person, place, and time.  Skin: Skin is warm and dry. No rash noted.  Psychiatric: She has a normal mood and affect.   ED Course  Procedures  DIAGNOSTIC STUDIES:   COORDINATION OF CARE: 12:12 AM Discussed treatment plan  with pt. Pt acknowledges and agrees to plan.   Labs Review Labs Reviewed  BASIC METABOLIC PANEL - Abnormal; Notable for the following:    Glucose, Bld 132 (*)    BUN 24 (*)    All other components within normal limits  CBC - Abnormal; Notable for the following:    Hemoglobin 10.2 (*)    HCT 33.3 (*)    MCH 25.2 (*)    RDW 18.1 (*)    All other components within normal limits  I-STAT TROPOININ, ED  Rosezena Sensor, ED    Imaging Review Dg Chest 2 View  11/01/2015  CLINICAL  DATA:  Sudden onset mid chest pain and shortness of breath since this afternoon. History of hypertension. History of tuberculosis as a child. EXAM: CHEST  2 VIEW COMPARISON:  Chest x-ray dated 02/21/2015. FINDINGS: Heart size is normal. Overall cardiomediastinal silhouette is stable in size and configuration. Lungs remain clear. No pleural effusions seen. No pneumothorax seen. Osseous and soft tissue structures about the chest are unremarkable. IMPRESSION: Stable chest x-ray. Lungs are clear and there is no evidence of acute cardiopulmonary abnormality. Electronically Signed   By: Bary Richard M.D.   On: 11/01/2015 23:50   I have personally reviewed and evaluated these images and lab results as part of my medical decision-making.   EKG Interpretation   Date/Time:  Friday November 01 2015 23:28:14 EST Ventricular Rate:  78 PR Interval:  153 QRS Duration: 85 QT Interval:  487 QTC Calculation: 555 R Axis:   59 Text Interpretation:  Sinus rhythm Multiple ventricular premature  complexes Left  atrial enlargement Probable left ventricular hypertrophy  Prolonged QT interval since last tracing no significant change Confirmed  by Asalee Barrette  MD, Doron Shake ([id number]) on 11/02/2015 12:21:02 AM      MDM   Final diagnoses:  Chest pain, unspecified chest pain type    Patient presented with chest pain and anxiety with the sensation that her throat was swelling after the feeling that she had bugs crawling all over her. I did not visualize any bugs. She had a Ziploc bag with what she says was bugs but appears to be just dry skin. She is alert and oriented and communicating appropriately. Her EKG does not show any ischemic changes. Her troponin is negative. I ordered a repeat troponin however patient left prior to this completion. I initially saw her in triage and she was sent back to the lobby.  She left AMA without my knowledge.  I personally performed the services described in this documentation, which was scribed in my presence.  The recorded information has been reviewed and considered.    [name], MD 11/02/15 [phone]

## 2015-11-02 ENCOUNTER — Encounter (HOSPITAL_COMMUNITY): Payer: Self-pay | Admitting: Emergency Medicine

## 2015-11-02 ENCOUNTER — Emergency Department (HOSPITAL_COMMUNITY)
Admission: EM | Admit: 2015-11-02 | Discharge: 2015-11-02 | Disposition: A | Discharge status: Home / Self Care | Payer: No Typology Code available for payment source | Attending: Emergency Medicine | Admitting: Emergency Medicine

## 2015-11-02 DIAGNOSIS — L299 Pruritus, unspecified: Secondary | ICD-10-CM

## 2015-11-02 DIAGNOSIS — Z79899 Other long term (current) drug therapy: Secondary | ICD-10-CM | POA: Insufficient documentation

## 2015-11-02 DIAGNOSIS — Z8611 Personal history of tuberculosis: Secondary | ICD-10-CM | POA: Insufficient documentation

## 2015-11-02 DIAGNOSIS — F17200 Nicotine dependence, unspecified, uncomplicated: Secondary | ICD-10-CM | POA: Insufficient documentation

## 2015-11-02 DIAGNOSIS — Z8619 Personal history of other infectious and parasitic diseases: Secondary | ICD-10-CM | POA: Insufficient documentation

## 2015-11-02 DIAGNOSIS — R079 Chest pain, unspecified: Secondary | ICD-10-CM

## 2015-11-02 DIAGNOSIS — F419 Anxiety disorder, unspecified: Secondary | ICD-10-CM | POA: Insufficient documentation

## 2015-11-02 DIAGNOSIS — Z79891 Long term (current) use of opiate analgesic: Secondary | ICD-10-CM | POA: Insufficient documentation

## 2015-11-02 DIAGNOSIS — I10 Essential (primary) hypertension: Secondary | ICD-10-CM | POA: Insufficient documentation

## 2015-11-02 DIAGNOSIS — Z7952 Long term (current) use of systemic steroids: Secondary | ICD-10-CM | POA: Insufficient documentation

## 2015-11-02 LAB — BASIC METABOLIC PANEL
ANION GAP: 13 (ref 5–15)
Anion gap: 10 (ref 5–15)
BUN: 20 mg/dL (ref 6–20)
BUN: 24 mg/dL — AB (ref 6–20)
CALCIUM: 9.7 mg/dL (ref 8.9–10.3)
CALCIUM: 9.3 mg/dL (ref 8.9–10.3)
CO2: 22 mmol/L (ref 22–32)
CO2: 23 mmol/L (ref 22–32)
CREATININE: 0.71 mg/dL (ref 0.44–1.00)
Chloride: 100 mmol/L — ABNORMAL LOW (ref 101–111)
Chloride: 104 mmol/L (ref 101–111)
Creatinine, Ser: 0.83 mg/dL (ref 0.44–1.00)
GFR calc Af Amer: >60 mL/min (ref >60)
GFR calc Af Amer: >60 mL/min (ref >60)
GFR calc non Af Amer: >60 mL/min (ref >60)
GFR calc non Af Amer: >60 mL/min (ref >60)
GLUCOSE: 103 mg/dL — AB (ref 65–99)
GLUCOSE: 132 mg/dL — AB (ref 65–99)
POTASSIUM: 3.5 mmol/L (ref 3.5–5.1)
Potassium: 3.5 mmol/L (ref 3.5–5.1)
Sodium: 135 mmol/L (ref 135–145)
Sodium: 137 mmol/L (ref 135–145)

## 2015-11-02 LAB — CBC
HCT: 33.3 % — ABNORMAL LOW (ref 36.0–46.0)
HEMATOCRIT: 34.8 % — AB (ref 36.0–46.0)
HEMOGLOBIN: 10.6 g/dL — AB (ref 12.0–15.0)
HEMOGLOBIN: 10.2 g/dL — AB (ref 12.0–15.0)
MCH: 24.4 pg — AB (ref 26.0–34.0)
MCH: 25.2 pg — AB (ref 26.0–34.0)
MCHC: 30.5 g/dL (ref 30.0–36.0)
MCHC: 30.6 g/dL (ref 30.0–36.0)
MCV: 80.2 fL (ref 78.0–100.0)
MCV: 82.4 fL (ref 78.0–100.0)
PLATELETS: 245 10*3/uL (ref 150–400)
Platelets: 294 10*3/uL (ref 150–400)
RBC: 4.04 MIL/uL (ref 3.87–5.11)
RBC: 4.34 MIL/uL (ref 3.87–5.11)
RDW: 18 % — ABNORMAL HIGH (ref 11.5–15.5)
RDW: 18.1 % — AB (ref 11.5–15.5)
WBC: 13 10*3/uL — ABNORMAL HIGH (ref 4.0–10.5)
WBC: 9.6 10*3/uL (ref 4.0–10.5)

## 2015-11-02 LAB — I-STAT TROPONIN, ED
TROPONIN I, POC: 0 ng/mL (ref 0.00–0.08)
Troponin i, poc: 0 ng/mL (ref 0.00–0.08)

## 2015-11-02 MED ORDER — OMEPRAZOLE 20 MG PO CPDR: 20.0000 mg | DELAYED_RELEASE_CAPSULE | Freq: Every day | ORAL | Status: DC

## 2015-11-02 MED ORDER — HYDROXYZINE HCL 25 MG PO TABS: 25.0000 mg | ORAL_TABLET | Freq: Three times a day (TID) | ORAL | Status: DC | PRN

## 2015-11-02 MED ORDER — HYDROXYZINE HCL 25 MG PO TABS
25.0000 mg | ORAL_TABLET | Freq: Once | ORAL | Status: AC
  Administered 2015-11-02: 25 mg via ORAL
  Filled 2015-11-02: qty 1

## 2015-11-02 NOTE — ED Notes (Signed)
Pt wheeled to front by this RN in nad and with rx and all belongings.

## 2015-11-02 NOTE — ED Notes (Signed)
Patient called   No answer

## 2015-11-02 NOTE — ED Notes (Signed)
Pt c/o center chest pain onset yesterday and feeling like her throat is on fire.

## 2015-11-02 NOTE — ED Notes (Signed)
Pt noted to be hyperventilating and states white bugs all over her. No bugs noted and pt breathing coached. Pt states cp gets worse with rapid breathing.

## 2015-11-02 NOTE — ED Provider Notes (Signed)
CSN: [id number]     Arrival date & time 11/02/15  1052 History   First MD Initiated Contact with Patient 11/02/15 1124     Chief Complaint  Patient presents with  . Chest Pain   (Consider location/radiation/quality/duration/timing/severity/associated sxs/prior Treatment) HPI 50 y.o. female presents to the Emergency Department today complaining of sudden onset of chest pain/SOB that started yesterday. Pt notes that her throat is closing up around her upper chest. Notes pain is a burning sensation and 8/10 pain. Pt also states that there are small, white insects in her bed and around her legs/arms. She was seen by a dermatologist 2 days ago due to the multiple pruritic lesions and given triamcinolone. Seen last night at [facility] for same complaint. Negative trop, EKG. Left prior to completion of second trop AMA. Denies any MI in the past, or hx panic attack/anxiety/depression. No risk factors for MI. No N/V/D. No ABD pain. No fevers.    Past Medical History  Diagnosis Date  . Tuberculosis     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 age 57, now s/p 1-yr of multidrug treatment (dates unknown)  . Hypertension   . Hepatitis C antibody test positive   . ASCUS (atypical squamous cells of undetermined significance) on Pap smear   . Opioid dependence (HCC)   . Trichomonas 04/17/2011    Diagnosed 04/02/11 during hospitalization, treated with Flagyl 2g, patient instructed to have partner treated (must follow-up)    Past Surgical History  Procedure Laterality Date  . Cesarean section  1984  . Salpingectomy Left March 2010    ectopic pregnancy  . Dilation and curettage of uterus    . I&d extremity  10/18/2011    Procedure: IRRIGATION AND DEBRIDEMENT EXTREMITY;  Surgeon: Sharma Covert, MD;  Location: [facility] OR;  Service: Orthopedics;  Laterality: Left;  . I&d extremity  10/21/2011    Procedure: IRRIGATION AND DEBRIDEMENT EXTREMITY;  Surgeon: Sharma Covert, MD;  Location: [facility] OR;  Service: Orthopedics;   Laterality: Left;   Family History  Problem Relation Age of Onset  . Heart attack Mother   . Heart attack Father   . Heart attack Paternal Grandmother   . Heart attack Paternal Grandfather   . Cirrhosis Brother    Social History  Substance Use Topics  . Smoking status: Current Every Day Smoker -- 0.50 packs/day for 20 years  . Smokeless tobacco: Never Used  . Alcohol Use: No   OB History    No data available     Review of Systems ROS reviewed and all are negative for acute change except as noted in the HPI.  Allergies  Review of patient's allergies indicates no known allergies.  Home Medications   Prior to Admission medications   Medication Sig Start Date End Date Taking? Authorizing Provider  desonide (DESOWEN) 0.05 % cream Apply topically 2 (two) times daily. Patient not taking: Reported on 11/01/2015 09/10/15   Smitty Cords, DO  esomeprazole (NEXIUM) 40 MG capsule Take 1 capsule (40 mg total) by mouth daily before breakfast. Patient not taking: Reported on 11/01/2015 06/25/14   Ardith Dark, MD  gabapentin (NEURONTIN) 100 MG capsule Take 1 capsule (100 mg total) by mouth 3 (three) times daily. 09/10/15   Smitty Cords, DO  hydrochlorothiazide (HYDRODIURIL) 25 MG tablet TAKE ONE TABLET BY MOUTH ONCE DAILY 04/10/15   Ardith Dark, MD  hydrocortisone cream 1 % Apply 1 application topically 2 (two) times daily. Do not apply to face Patient not  taking: Reported on 11/01/2015 08/10/15   Joni Reining Pisciotta, PA-C  hydrOXYzine (ATARAX/VISTARIL) 25 MG tablet Take 1 tablet (25 mg total) by mouth every 8 (eight) hours as needed for itching. 10/04/15   Bonney Aid, MD  ibuprofen (ADVIL,MOTRIN) 800 MG tablet Take 1 tablet (800 mg total) by mouth every 8 (eight) hours as needed. 10/10/15   Ardith Dark, MD  lisinopril (PRINIVIL,ZESTRIL) 20 MG tablet Take 1 tablet (20 mg total) by mouth daily. 09/10/15   Smitty Cords, DO  methadone (DOLOPHINE) 5 MG/5ML  solution Take 50 mg by mouth every morning.     Historical Provider, MD  ondansetron (ZOFRAN) 4 MG tablet Take 1 tablet (4 mg total) by mouth every 8 (eight) hours as needed for nausea or vomiting. 10/04/15   Alyssa A Haney, MD  triamcinolone cream (KENALOG) 0.1 % Apply 1 application topically 2 (two) times daily.    Historical Provider, MD   BP 167/94 mmHg  Pulse 86  Temp(Src) 97.4 F (36.3 C) (Oral)  Resp 22  Ht 5\' 3"  (1.6 m)  Wt 62.681 kg  BMI 24.48 kg/m2  SpO2 100%  LMP 10/21/2014 (Approximate)   Physical Exam  Constitutional: She is oriented to person, place, and time. She appears well-developed and well-nourished.  Pt very anxious on exam  HENT:  Head: Normocephalic and atraumatic.  No visible airway swelling/occlusion. No angioedema noted. No Stridor.   Eyes: EOM are normal.  Cardiovascular: Normal rate, regular rhythm and normal heart sounds.   Pulmonary/Chest: Effort normal and breath sounds normal.  Abdominal: Soft. There is no tenderness.  Musculoskeletal: Normal range of motion.  Neurological: She is alert and oriented to person, place, and time.  Skin: Skin is warm and dry.  Psychiatric: She has a normal mood and affect. Her behavior is normal. Thought content normal.  Nursing note and vitals reviewed.  ED Course  Procedures (including critical care time) Labs Review Labs Reviewed  BASIC METABOLIC PANEL - Abnormal; Notable for the following:    Chloride 100 (*)    Glucose, Bld 103 (*)    All other components within normal limits  CBC - Abnormal; Notable for the following:    WBC 13.0 (*)    Hemoglobin 10.6 (*)    HCT 34.8 (*)    MCH 24.4 (*)    RDW 18.0 (*)    All other components within normal limits  I-STAT TROPOININ, ED    Imaging Review Dg Chest 2 View  11/01/2015  CLINICAL DATA:  Sudden onset mid chest pain and shortness of breath since this afternoon. History of hypertension. History of tuberculosis as a child. EXAM: CHEST  2 VIEW COMPARISON:   Chest x-ray dated 02/21/2015. FINDINGS: Heart size is normal. Overall cardiomediastinal silhouette is stable in size and configuration. Lungs remain clear. No pleural effusions seen. No pneumothorax seen. Osseous and soft tissue structures about the chest are unremarkable. IMPRESSION: Stable chest x-ray. Lungs are clear and there is no evidence of acute cardiopulmonary abnormality. Electronically Signed   By: Bary Richard M.D.   On: 11/01/2015 23:50   I have personally reviewed and evaluated these images and lab results as part of my medical decision-making.   EKG Interpretation   Date/Time:  Saturday November 02 2015 10:58:40 EST Ventricular Rate:  86 PR Interval:  144 QRS Duration: 74 QT Interval:  406 QTC Calculation: 485 R Axis:   64 Text Interpretation:  Normal sinus rhythm Biatrial enlargement Nonspecific  ST abnormality Prolonged QT Abnormal ECG  Confirmed by Lincoln Brigham [phone]) on  11/02/2015 12:21:10 PM      MDM  I have reviewed relevant laboratory values. I have reviewed relevant imaging studies. I personally interpreted the relevant EKG. I have reviewed the relevant previous healthcare records. I obtained HPI from historian. Patient discussed with supervising physician  ED Course:  Assessment: 5y F here for chest pain and sensation that her throat is swelling as well as bugs crawling all over her body. Seen last night in [facility] ED for same complaint. Trops negative. EKG unremarkable. Has triamcinolone for pruritic rashes. On exam, Lungs CTA. Heart RRR. Pt non-toxic appearing. Pt anxious due to suspected bugs on skin. I did no visualize any bugs. Pt pointed to dry skin on body and stated they were insects. Trop here neg. EKG unremarkable. Given hydroxyzine in ED. Symptoms improved. Will give Rx for Hydroxyzine and PPI. Will DC with follow up to PCP for further management.      Disposition/Plan:  DC Home Additional Verbal discharge instructions given and discussed with patient.  Pt  Instructed to f/u with PCP in the next 48 hours for evaluation and treatment of symptoms. Return precautions given Pt acknowledges and agrees with plan  Supervising Physician Tilden Fossa, MD   Final diagnoses:  Chest pain, unspecified chest pain type     Audry Pili, PA-C 11/02/15 1345  Tilden Fossa, MD 11/03/15 [phone]

## 2015-11-02 NOTE — Discharge Instructions (Signed)
Please read and follow all provided instructions.  Your diagnoses today include:  1. Chest pain, unspecified chest pain type   2. Pruritic dermatitis    Tests performed today include:  An EKG of your heart  A chest x-ray  Cardiac enzymes - a blood test for heart muscle damage  Blood counts and electrolytes  Vital signs. See below for your results today.   Medications prescribed:   Take any prescribed medications only as directed.  Follow-up instructions: Please follow-up with your primary care provider as soon as you can for further evaluation of your symptoms.   Return instructions:  SEEK IMMEDIATE MEDICAL ATTENTION IF:  You have severe chest pain, especially if the pain is crushing or pressure-like and spreads to the arms, back, neck, or jaw, or if you have sweating, nausea (feeling sick to your stomach), or shortness of breath. THIS IS AN EMERGENCY. Don't wait to see if the pain will go away. Get medical help at once. Call 911 or 0 (operator). DO NOT drive yourself to the hospital.   Your chest pain gets worse and does not go away with rest.   You have an attack of chest pain lasting longer than usual, despite rest and treatment with the medications your caregiver has prescribed.   You wake from sleep with chest pain or shortness of breath.  You feel dizzy or faint.  You have chest pain not typical of your usual pain for which you originally saw your caregiver.   You have any other emergent concerns regarding your health.  Additional Information: Chest pain comes from many different causes. Your caregiver has diagnosed you as having chest pain that is not specific for one problem, but does not require admission.  You are at low risk for an acute heart condition or other serious illness.   Your vital signs today were: BP 169/105 mmHg   Pulse 74   Temp(Src) 97.4 F (36.3 C) (Oral)   Resp 14   Ht  (1.6 m)   Wt 62.681 kg   BMI 24.48 kg/m2   SpO2 97%   LMP 10/21/2014  (Approximate) If your blood pressure (BP) was elevated above 135/85 this visit, please have this repeated by your doctor within one month. --------------

## 2015-11-05 ENCOUNTER — Telehealth: Payer: Self-pay | Admitting: Family Medicine

## 2015-11-05 NOTE — Telephone Encounter (Signed)
Pt calling to check on status of referral. Ariana Jensen, ASA

## 2015-11-05 NOTE — Telephone Encounter (Signed)
Tried calling patient but mailbox is full so I was unable to leave a voicemail.  Please relay to patient when she calls back that her referral has been faxed to [facility] and they will contact her with an appt for general surgery once they have an opening.  It could be a few months wait. Jazmin Hartsell,CMA

## 2015-11-12 ENCOUNTER — Encounter: Payer: Self-pay | Admitting: Family Medicine

## 2015-11-12 ENCOUNTER — Ambulatory Visit (INDEPENDENT_AMBULATORY_CARE_PROVIDER_SITE_OTHER): Payer: Self-pay | Admitting: Family Medicine

## 2015-11-12 VITALS — BP 94/58 | HR 87 | Temp 98.3°F | Wt 132.0 lb

## 2015-11-12 DIAGNOSIS — K5903 Drug induced constipation: Secondary | ICD-10-CM

## 2015-11-12 DIAGNOSIS — T402X5A Adverse effect of other opioids, initial encounter: Secondary | ICD-10-CM

## 2015-11-12 MED ORDER — POLYETHYLENE GLYCOL 3350 17 GM/SCOOP PO POWD
34.0000 g | Freq: Two times a day (BID) | ORAL | Status: DC | PRN
Start: 2015-11-12 — End: 2018-06-21

## 2015-11-12 MED ORDER — LACTULOSE 10 GM/15ML PO SOLN: 10.0000 g | Freq: Two times a day (BID) | ORAL | Status: DC | PRN

## 2015-11-12 MED ORDER — DOCUSATE SODIUM 100 MG PO CAPS
100.0000 mg | ORAL_CAPSULE | Freq: Two times a day (BID) | ORAL | Status: DC
Start: 2015-11-12 — End: 2016-07-06

## 2015-11-12 NOTE — Progress Notes (Signed)
Patient ID: Ariana Jensen, female   DOB: March 06, 1966, 50 y.o.   MRN: [id number]   [facility] Yolande Jolly, MD Phone: [phone]  Subjective:   ABDOMINAL PAIN  Pain began 14 days ago Medications tried: prune juice, water, sodas.  Similar pain before: No Prior abdominal surgeries: None. Though to have umbilical hernia at her last visit one week ago and referred to general surgery, but she has not been seen yet.    Symptoms Nausea/vomiting: Feels nauseated with eating, cannot take food, but is not actively having nausea or vomiting.  Diarrhea: none.  Constipation: Constipated for two weeks now. She is on methadone, and has had issues with this in the past.  Blood in stool: None.  Blood in vomit: none.  Fever: None. Dysuria: None.  Loss of appetite: Yes, mostly because eating makes her stomach feel bloated and painful.  Weight loss: none.  Passing gas.   Vaginal Bleeding: None.  Missed menstrual period: No.   Review of Symptoms - see HPI PMH - Smoking status noted.    All relevant systems were reviewed and were negative unless otherwise noted in the HPI  Past Medical History Reviewed problem list.  Medications- reviewed and updated Current Outpatient Prescriptions  Medication Sig Dispense Refill  . docusate sodium (COLACE) 100 MG capsule Take 1 capsule (100 mg total) by mouth 2 (two) times daily. 30 capsule 0  . gabapentin (NEURONTIN) 100 MG capsule Take 1 capsule (100 mg total) by mouth 3 (three) times daily. 90 capsule 0  . hydrochlorothiazide (HYDRODIURIL) 25 MG tablet TAKE ONE TABLET BY MOUTH ONCE DAILY 90 tablet 0  . hydrOXYzine (ATARAX/VISTARIL) 25 MG tablet Take 1 tablet (25 mg total) by mouth every 8 (eight) hours as needed for itching. 20 tablet 0  . ibuprofen (ADVIL,MOTRIN) 800 MG tablet Take 1 tablet (800 mg total) by mouth every 8 (eight) hours as needed. 30 tablet 0  . lactulose (CHRONULAC) 10 GM/15ML solution Take 15 mLs (10 g  total) by mouth 2 (two) times daily as needed for mild constipation. Take with the miralax if you do not experience relief on day 1. 240 mL 0  . lisinopril (PRINIVIL,ZESTRIL) 20 MG tablet Take 1 tablet (20 mg total) by mouth daily. 90 tablet 0  . methadone (DOLOPHINE) 5 MG/5ML solution Take 50 mg by mouth every morning.     Marland Kitchen omeprazole (PRILOSEC) 20 MG capsule Take 1 capsule (20 mg total) by mouth daily. 20 capsule 0  . ondansetron (ZOFRAN) 4 MG tablet Take 1 tablet (4 mg total) by mouth every 8 (eight) hours as needed for nausea or vomiting. 20 tablet 0  . polyethylene glycol powder (GLYCOLAX/MIRALAX) powder Take 34 g by mouth 2 (two) times daily as needed. Take until you have a bowel movement. 3350 g 1  . triamcinolone cream (KENALOG) 0.1 % Apply 1 application topically 2 (two) times daily.     Current Facility-Administered Medications  Medication Dose Route Frequency Provider Last Rate Last Dose  . hepatitis A virus (PF) vaccine (HAVRIX (PF)) 1440 EL U/ML injection 1,440 Units  1 mL Intramuscular Once Shelva Majestic, MD       Chief complaint-noted No additions to family history Social history- patient is a non smoker  Objective: BP 94/58 mmHg  Pulse 87  Wt 132 lb (59.875 kg)  LMP 10/21/2014 (Approximate) Gen: NAD, alert, cooperative with exam HEENT: NCAT, EOMI, PERRL Neck: FROM, supple CV: RRR, good S1/S2, no murmur Resp: CTABL, no wheezes,  non-labored Abd: SNTND, no palpable hernia, BS decreased, no guarding or organomegaly  Ext: No edema, warm, normal tone, moves UE/LE spontaneously Neuro: Alert and oriented, No gross deficits Skin: no rashes no lesions  Assessment/Plan:  Constipation in the setting of Opioid use - pt. On methadone chronically. Has been constipated for 2 weeks. Abdomen soft to exam and nontender. No active nausea and vomiting. Not completely obstructed as she is passing gas and having small BM periodically. - placed on miralax 2 cap fulls BID until bowel  movement.  - Colace - Sorbitol to be taken twice daily if miralax doesn't work on day 1.  - Drink plenty of water - Return precautions reviewed including, worsening of nausea, vomiting, abdominal pain, fever, or chills.  - follow up with PCP as needed.  - Needs follow up with surgery for ? Hernia. None palpable today.

## 2015-11-12 NOTE — Patient Instructions (Signed)
Thanks for coming in today.   You need to take 2 cap fulls of the miralax medication twice a day until you have a bowel movement.   You should also take the colace one pill twice daily as well.   If you develop ongoing nausea, vomiting, and abdominal pain.   Thanks for letting us take care of you.   Sincerely, Devota Pace, MD Family Medicine - PGY 2  Constipation, Adult Constipation is when a person has fewer than three bowel movements a week, has difficulty having a bowel movement, or has stools that are dry, hard, or larger than normal. As people grow older, constipation is more common. A low-fiber diet, not taking in enough fluids, and taking certain medicines may make constipation worse.  CAUSES   Certain medicines, such as antidepressants, pain medicine, iron supplements, antacids, and water pills.   Certain diseases, such as diabetes, irritable bowel syndrome (IBS), thyroid disease, or depression.   Not drinking enough water.   Not eating enough fiber-rich foods.   Stress or travel.   Lack of physical activity or exercise.   Ignoring the urge to have a bowel movement.   Using laxatives too much.  SIGNS AND SYMPTOMS   Having fewer than three bowel movements a week.   Straining to have a bowel movement.   Having stools that are hard, dry, or larger than normal.   Feeling full or bloated.   Pain in the lower abdomen.   Not feeling relief after having a bowel movement.  DIAGNOSIS  Your health care provider will take a medical history and perform a physical exam. Further testing may be done for severe constipation. Some tests may include:  A barium enema X-ray to examine your rectum, colon, and, sometimes, your small intestine.   A sigmoidoscopy to examine your lower colon.   A colonoscopy to examine your entire colon. TREATMENT  Treatment will depend on the severity of your constipation and what is causing it. Some dietary treatments include  drinking more fluids and eating more fiber-rich foods. Lifestyle treatments may include regular exercise. If these diet and lifestyle recommendations do not help, your health care provider may recommend taking over-the-counter laxative medicines to help you have bowel movements. Prescription medicines may be prescribed if over-the-counter medicines do not work.  HOME CARE INSTRUCTIONS   Eat foods that have a lot of fiber, such as fruits, vegetables, whole grains, and beans.  Limit foods high in fat and processed sugars, such as french fries, hamburgers, cookies, candies, and soda.   A fiber supplement may be added to your diet if you cannot get enough fiber from foods.   Drink enough fluids to keep your urine clear or pale yellow.   Exercise regularly or as directed by your health care provider.   Go to the restroom when you have the urge to go. Do not hold it.   Only take over-the-counter or prescription medicines as directed by your health care provider. Do not take other medicines for constipation without talking to your health care provider first.  SEEK IMMEDIATE MEDICAL CARE IF:   You have bright red blood in your stool.   Your constipation lasts for more than 4 days or gets worse.   You have abdominal or rectal pain.   You have thin, pencil-like stools.   You have unexplained weight loss. MAKE SURE YOU:   Understand these instructions.  Will watch your condition.  Will get help right away if you are not doing  well or get worse.   This information is not intended to replace advice given to you by your health care provider. Make sure you discuss any questions you have with your health care provider.   Document Released: 06/05/2004 Document Revised: 09/28/2014 Document Reviewed: 06/19/2013 Elsevier Interactive Patient Education [employer].

## 2015-11-17 ENCOUNTER — Other Ambulatory Visit: Payer: Self-pay | Admitting: Family Medicine

## 2015-11-17 ENCOUNTER — Emergency Department (HOSPITAL_COMMUNITY)
Admission: EM | Admit: 2015-11-17 | Discharge: 2015-11-17 | Disposition: A | Discharge status: Home / Self Care | Payer: No Typology Code available for payment source | Attending: Emergency Medicine | Admitting: Emergency Medicine

## 2015-11-17 ENCOUNTER — Encounter (HOSPITAL_COMMUNITY): Payer: Self-pay

## 2015-11-17 DIAGNOSIS — L299 Pruritus, unspecified: Secondary | ICD-10-CM

## 2015-11-17 DIAGNOSIS — Z8619 Personal history of other infectious and parasitic diseases: Secondary | ICD-10-CM | POA: Insufficient documentation

## 2015-11-17 DIAGNOSIS — Z8611 Personal history of tuberculosis: Secondary | ICD-10-CM | POA: Insufficient documentation

## 2015-11-17 DIAGNOSIS — L308 Other specified dermatitis: Secondary | ICD-10-CM

## 2015-11-17 DIAGNOSIS — I10 Essential (primary) hypertension: Secondary | ICD-10-CM | POA: Insufficient documentation

## 2015-11-17 DIAGNOSIS — Z79899 Other long term (current) drug therapy: Secondary | ICD-10-CM | POA: Insufficient documentation

## 2015-11-17 DIAGNOSIS — F17200 Nicotine dependence, unspecified, uncomplicated: Secondary | ICD-10-CM | POA: Insufficient documentation

## 2015-11-17 MED ORDER — PERMETHRIN 5 % EX CREA: 1.0000 "application " | TOPICAL_CREAM | Freq: Once | CUTANEOUS | Status: DC

## 2015-11-17 MED ORDER — TRIAMCINOLONE ACETONIDE 0.1 % EX CREA: 1.0000 "application " | TOPICAL_CREAM | Freq: Two times a day (BID) | CUTANEOUS | Status: DC

## 2015-11-17 MED ORDER — HYDROXYZINE HCL 25 MG PO TABS: 25.0000 mg | ORAL_TABLET | Freq: Four times a day (QID) | ORAL | Status: DC

## 2015-11-17 NOTE — Discharge Instructions (Signed)
Pruritus Pruritus is an itching feeling. There are many different conditions and factors that can make your skin itchy. Dry skin is one of the most common causes of itching. Most cases of itching do not require medical attention. Itchy skin can turn into a rash.  HOME CARE INSTRUCTIONS  Watch your pruritus for any changes. Take these steps to help with your condition:  Skin Care  Moisturize your skin as needed. A moisturizer that contains petroleum jelly is best for keeping moisture in your skin.  Take or apply medicines only as directed by your health care provider. This may include:  Corticosteroid cream.  Anti-itch lotions.  Oral anti-histamines.  Apply cool compresses to the affected areas.  Try taking a bath with:  Epsom salts. Follow the instructions on the packaging. You can get these at your local pharmacy or grocery store.  Baking soda. Pour a small amount into the bath as directed by your health care provider.  Colloidal oatmeal. Follow the instructions on the packaging. You can get this at your local pharmacy or grocery store.  Try applying baking soda paste to your skin. Stir water into baking soda until it reaches a paste-like consistency.   Do not scratch your skin.  Avoid hot showers or baths, which can make itching worse. A cold shower may help with itching as long as you use a moisturizer after.  Avoid scented soaps, detergents, and perfumes. Use gentle soaps, detergents, perfumes, and other cosmetic products. General Instructions  Avoid wearing tight clothes.  Keep a journal to help track what causes your itch. Write down:  What you eat.  What cosmetic products you use.  What you drink.  What you wear. This includes jewelry.  Use a humidifier. This keeps the air moist, which helps to prevent dry skin. SEEK MEDICAL CARE IF:  The itching does not go away after several days.  You sweat at night.  You have weight loss.  You are unusually  thirsty.  You urinate more than normal.  You are more tired than normal.  You have abdominal pain.  Your skin tingles.  You feel weak.  Your skin or the whites of your eyes look yellow (jaundice).  Your skin feels numb.   This information is not intended to replace advice given to you by your health care provider. Make sure you discuss any questions you have with your health care provider.   Follow-up with your dermatologist as indicated. Apply steroid cream and take Vistaril as needed for itch. Return to the emergency department if you experience severe worsening of your symptoms, spread of rash, redness or swelling around your sores, fever, chills, difficulty breathing or swallowing, facial or lip swelling.

## 2015-11-17 NOTE — ED Provider Notes (Signed)
CSN: [id number]     Arrival date & time 11/17/15  1151 History  By signing my name below, I, Linna Darner, attest that this documentation has been prepared under the direction and in the presence of non-physician practitioner, Gaylyn Rong, PA-C. Electronically Signed: Linna Darner, Scribe. 11/17/2015. 12:53 PM.    Chief Complaint  Patient presents with  . Pruritis    The history is provided by the patient. No language interpreter was used.     HPI Comments: Ariana Jensen is a 50 y.o. female with h/o pruritic dermatitis who presents to the Emergency Department complaining of sudden onset, constant, body pruritis for the last couple of weeks. She states that she was diagnosed with scabies two weeks ago; she used all of her permethrin cream and reports itching has come back since she ran out of permethrin. She states that permethrin cream works with significant relief. Pt is also taking vistaril and has used OTC anti-itch medications. Pt reports that she has experienced body pruritis for the last six months. She denies facial swelling, trouble breathing, or any other associated symptoms at this time.    Past Medical History  Diagnosis Date  . Tuberculosis     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 age 84, now s/p 1-yr of multidrug treatment (dates unknown)  . Hypertension   . Hepatitis C antibody test positive   . ASCUS (atypical squamous cells of undetermined significance) on Pap smear   . Opioid dependence (HCC)   . Trichomonas 04/17/2011    Diagnosed 04/02/11 during hospitalization, treated with Flagyl 2g, patient instructed to have partner treated (must follow-up)    Past Surgical History  Procedure Laterality Date  . Cesarean section  1984  . Salpingectomy Left March 2010    ectopic pregnancy  . Dilation and curettage of uterus    . I&d extremity  10/18/2011    Procedure: IRRIGATION AND DEBRIDEMENT EXTREMITY;  Surgeon: Sharma Covert, MD;  Location: [facility] OR;  Service:  Orthopedics;  Laterality: Left;  . I&d extremity  10/21/2011    Procedure: IRRIGATION AND DEBRIDEMENT EXTREMITY;  Surgeon: Sharma Covert, MD;  Location: [facility] OR;  Service: Orthopedics;  Laterality: Left;   Family History  Problem Relation Age of Onset  . Heart attack Mother   . Heart attack Father   . Heart attack Paternal Grandmother   . Heart attack Paternal Grandfather   . Cirrhosis Brother    Social History  Substance Use Topics  . Smoking status: Current Every Day Smoker -- 0.50 packs/day for 20 years  . Smokeless tobacco: Never Used  . Alcohol Use: No   OB History    No data available     Review of Systems  All other systems reviewed and are negative.     Allergies  Review of patient's allergies indicates no known allergies.  Home Medications   Prior to Admission medications   Medication Sig Start Date End Date Taking? Authorizing Provider  docusate sodium (COLACE) 100 MG capsule Take 1 capsule (100 mg total) by mouth 2 (two) times daily. Patient taking differently: Take 100 mg by mouth 2 (two) times daily as needed for mild constipation.  11/12/15  Yes Yolande Jolly, MD  gabapentin (NEURONTIN) 100 MG capsule Take 1 capsule (100 mg total) by mouth 3 (three) times daily. 09/10/15  Yes Alexander Fredric Mare, DO  hydrochlorothiazide (HYDRODIURIL) 25 MG tablet TAKE ONE TABLET BY MOUTH ONCE DAILY 04/10/15  Yes Ardith Dark, MD  hydrOXYzine (ATARAX/VISTARIL) 25 MG  tablet Take 1 tablet (25 mg total) by mouth every 8 (eight) hours as needed for itching. 11/02/15  Yes Audry Pili, PA-C  ibuprofen (ADVIL,MOTRIN) 800 MG tablet Take 1 tablet (800 mg total) by mouth every 8 (eight) hours as needed. Patient taking differently: Take 800 mg by mouth every 8 (eight) hours as needed for mild pain or moderate pain.  10/10/15  Yes Ardith Dark, MD  lactulose (CHRONULAC) 10 GM/15ML solution Take 15 mLs (10 g total) by mouth 2 (two) times daily as needed for mild constipation. Take with  the miralax if you do not experience relief on day 1. 11/12/15  Yes Yolande Jolly, MD  lisinopril (PRINIVIL,ZESTRIL) 20 MG tablet Take 1 tablet (20 mg total) by mouth daily. 09/10/15  Yes Smitty Cords, DO  methadone (DOLOPHINE) 5 MG/5ML solution Take 50 mg by mouth every morning.    Yes Historical Provider, MD  omeprazole (PRILOSEC) 20 MG capsule Take 1 capsule (20 mg total) by mouth daily. Patient taking differently: Take 20 mg by mouth daily as needed (heartburn/acid reflux).  11/02/15  Yes Audry Pili, PA-C  ondansetron (ZOFRAN) 4 MG tablet Take 1 tablet (4 mg total) by mouth every 8 (eight) hours as needed for nausea or vomiting. 10/04/15  Yes Alyssa A Haney, MD  permethrin (ELIMITE) 5 % cream Apply 1 application topically once.   Yes Historical Provider, MD  polyethylene glycol powder (GLYCOLAX/MIRALAX) powder Take 34 g by mouth 2 (two) times daily as needed. Take until you have a bowel movement. 11/12/15  Yes Hillery Hunter Melancon, MD   BP 151/86 mmHg  Pulse 73  Temp(Src) 97.8 F (36.6 C) (Oral)  Resp 18  SpO2 100%  LMP 10/21/2014 (Approximate) Physical Exam  Constitutional: She is oriented to person, place, and time. She appears well-developed and well-nourished. No distress.  HENT:  Head: Normocephalic and atraumatic.  Eyes: Conjunctivae are normal. Right eye exhibits no discharge. Left eye exhibits no discharge. No scleral icterus.  Cardiovascular: Normal rate.   Pulmonary/Chest: Effort normal.  Neurological: She is alert and oriented to person, place, and time. Coordination normal.  Skin: Skin is warm and dry. No rash noted. She is not diaphoretic. No erythema. No pallor.  Diffuse macular papular rash on all four extremities with severe excoriations Different healing stages of all sores No surrounding erythema or warmth No lip or tongue swelling  Psychiatric: She has a normal mood and affect. Her behavior is normal.  Nursing note and vitals reviewed.   ED Course   Procedures (including critical care time)  DIAGNOSTIC STUDIES: Oxygen Saturation is 100% on RA, normal by my interpretation.    COORDINATION OF CARE: 12:53 PM Will discharge with permethrin 5% cream, vistaril 25 mg tablet, and kenalog 0.1%.  Discussed treatment plan with pt at bedside and pt agreed to plan.   Labs Review Labs Reviewed - No data to display  Imaging Review No results found.   EKG Interpretation None      MDM   Final diagnoses:  Pruritic dermatitis    Pt presents with diffuse body rash and itching. Pt states she was diagnosed with scabies 2 weeks ago and used a tube of premethrin cream which greatly improved her symptoms. She is out and is requesting another tube as well as a refill on her Vistaril. Upon review of pts records, pt has had ongoing  pruiritic dermatitis for several months and has been seen by both her PCP and dermatology and was diagnosed with Psychogenic  pruritis. Pt had complete work up including HIV, Hep A which was all negative. Pt has also been seen in ED several times for this. Pt has a long standing history of polysubstance abuse and per PCP notes, it is felt this may be causing pts issue. Rash today is not consistent with scabies. No burrowing. Will d/c home with vistaril and triamcinolone. Pt adamantly requesting prescription for premethrin, will refill although doubt scabies. Per up to date, 2 treatments of premethrin is safe. Pt will follow up with dermatology. Return precautions outlined in patient discharge instructions.    I personally performed the services described in this documentation, which was scribed in my presence. The recorded information has been reviewed and is accurate.      Camree Wigington Pringle, PA-C 11/17/15 1551  Geoffery Lyons, MD 11/18/15 [phone]

## 2015-11-17 NOTE — ED Notes (Signed)
Pt dx with scabies.  Finished treatment.  Coming back d/t itching continues.

## 2015-11-18 NOTE — Telephone Encounter (Signed)
Rx filled.  Ariana Jensen. [name], MD [facility] Family Medicine Resident PGY-2 11/18/2015 8:26 AM

## 2015-11-25 ENCOUNTER — Other Ambulatory Visit: Payer: Self-pay | Admitting: Family Medicine

## 2015-11-25 NOTE — Telephone Encounter (Signed)
VM full.  Please ask patient to make an appt to be seen this week before we can refill this cream.  Thanks Jazmin Hartsell,CMA

## 2015-11-25 NOTE — Telephone Encounter (Signed)
Need refill on permethrin cream to [facility] on [address]

## 2015-11-25 NOTE — Telephone Encounter (Signed)
Patient has received 2 previous prescriptions for this. Notes report unlikely scabies dx, possible psychogenic cause. If patient's itching persists then office visit would likely be of more benefit than blind prescriptions.  -Ariana SpryIan

## 2015-11-26 ENCOUNTER — Ambulatory Visit (INDEPENDENT_AMBULATORY_CARE_PROVIDER_SITE_OTHER): Payer: No Typology Code available for payment source | Admitting: Student

## 2015-11-26 ENCOUNTER — Encounter: Payer: Self-pay | Admitting: Student

## 2015-11-26 VITALS — BP 168/100 | HR 74 | Temp 97.8°F | Wt 136.0 lb

## 2015-11-26 DIAGNOSIS — F1410 Cocaine abuse, uncomplicated: Secondary | ICD-10-CM

## 2015-11-26 DIAGNOSIS — F1490 Cocaine use, unspecified, uncomplicated: Secondary | ICD-10-CM

## 2015-11-26 DIAGNOSIS — L299 Pruritus, unspecified: Secondary | ICD-10-CM

## 2015-11-26 MED ORDER — PERMETHRIN 5 % EX CREA: 1.0000 "application " | TOPICAL_CREAM | Freq: Once | CUTANEOUS | Status: DC

## 2015-11-26 NOTE — Patient Instructions (Signed)
Follow-up with PCP in 2 weeks for itching If you have questions or concerns please call the office at [phone]

## 2015-11-26 NOTE — Assessment & Plan Note (Addendum)
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 She feels that permethrin has helped her itching. When asking if she would be interested in seeking other causes of her itching including psychogenic causes she became mildly defensive. - Given concern that cocaine is contributing to her itching will collect yesterday  -will refill permethrin as requested  -will need to be evaluated for psychogenic causes of pruritus and will follow with PCP for this -

## 2015-11-26 NOTE — Assessment & Plan Note (Signed)
Blood pressure initially elevated to 189/109, on repeat blood pressure blood pressure improved to 160/100. Patient reports compliance with blood pressure medication is not having symptoms at this time -Patient strongly counseled to take blood pressure medications as prescribed -On follow-up will adjust blood pressure regimen as needed.

## 2015-11-26 NOTE — Progress Notes (Signed)
   Subjective:    Patient ID: Ariana BaloSamantha Bedel, female    DOB: 01-09-1966, 50 y.o.   MRN: [id number]   CC: Itching  HPI: 50 year old female presenting for concern for scabies desire for refill of permethrin cream.  Skin itching - Patient reports [facility] where she was told she had scabies and given permethrin cream - She reports that ever permethrin cream has resolved her itching and desires repeat treatment.  -She reports that her rash has improved -Of note she reports that she continues to use cocaine with her last use approximately 1 week ago  Review of Systems ROS per history of present illness otherwise she denies recent illnesses, fevers, nausea, vomiting, diarrhea, chest pain, shortness of breath, headache  Past Medical, Surgical, Social, and Family History Reviewed & Updated per EMR.   Objective:  BP 168/100 mmHg  Pulse 74  Temp(Src) 97.8 F (36.6 C) (Oral)  Wt 136 lb (61.689 kg)  LMP 10/21/2014 (Approximate) Vitals and nursing note reviewed  General: NAD, agitated appearing Cardiac: RRR, normal heart sounds,  Respiratory: CTAB, normal effort Abdomen: obese, soft, nontender, nondistended, bowel sounds present Extremities: no edema or cyanosis. WWP. 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 She feels that permethrin has helped her itching. When asking if she would be interested in seeking other causes of her itching including psychogenic causes she became mildly defensive. - Given concern that cocaine is contributing to her itching will collect yesterday  -will refill permethrin as  requested  -will need to be evaluated for psychogenic causes of pruritus and will follow with PCP for this -   HTN (hypertension) Blood pressure initially elevated to 189/109, on repeat blood pressure blood pressure improved to 160/100. Patient reports compliance with blood pressure medication is not having symptoms at this time -Patient strongly counseled to take blood pressure medications as prescribed -On follow-up will adjust blood pressure regimen as needed.     Alyssa A. Kennon RoundsHaney MD, MS Family Medicine Resident PGY-2 Pager [phone]

## 2015-12-02 ENCOUNTER — Other Ambulatory Visit: Payer: Self-pay | Admitting: Family Medicine

## 2015-12-02 NOTE — Telephone Encounter (Signed)
Rx filled.  Ariana Degreealeb M. Jimmey RalphParker, MD [facility] Family Medicine Resident PGY-2 12/02/2015 2:41 PM

## 2015-12-09 ENCOUNTER — Ambulatory Visit: Payer: Self-pay | Admitting: Surgery

## 2015-12-09 ENCOUNTER — Telehealth: Payer: Self-pay | Admitting: Family Medicine

## 2015-12-09 DIAGNOSIS — T85848D Pain due to other internal prosthetic devices, implants and grafts, subsequent encounter: Secondary | ICD-10-CM

## 2015-12-09 NOTE — H&P (Signed)
History of Present Illness Ariana Jensen. Ariana Racey MD; 12/09/2015 11:31 AM) Patient words: hernia.  The patient is a 50 year old female who presents with an umbilical hernia. Referred by Dr. Tawni Carnes for umbilical hernia  This is a 50 year old female who presents with several months of an enlarging umbilical hernia. Discloses some discomfort. He remains reducible. It has enlarged slightly since she first discovered it. She does not remember any initiating event.  The patient has some history of substance abuse and is on chronic methadone. She states that she has been clean. She does smoke cigarettes but does not drink any alcohol.   Other Problems (Ariana Eversole, LPN; 03/20/5283 13:24 AM) Depression Gastroesophageal Reflux Disease Hemorrhoids Hepatitis  Past Surgical History (Ariana Eversole, LPN; 12/21/270 53:66 AM) Cesarean Section - 1  Diagnostic Studies History (Ariana Eversole, LPN; 4/40/3474 25:95 AM) Pap Smear >5 years ago  Allergies (Ariana Eversole, LPN; 6/38/7564 33:29 AM) No Known Drug Allergies 12/09/2015  Medication History (Ariana Eversole, LPN; 02/06/8415 60:63 AM) Vistaril (  Capsule, Oral) Active. HydroCHLOROthiazide (  Tablet, Oral) Active. Lactulose (10GM/15ML Solution, Oral) Active. Lisinopril (  Tablet, Oral) Active. Methadone HCl ( /5ML Solution, Oral) Active. PriLOSEC (  Capsule DR, Oral) Active. MiraLax (Oral) Active. Medications Reconciled  Social History (Ariana Eversole, LPN; 0/16/0109 32:35 AM) Alcohol use Occasional alcohol use. Caffeine use Carbonated beverages, Coffee. Illicit drug use Remotely quit drug use. Tobacco use Former smoker.  Family History (Ariana Pilling, LPN; 5/73/2202 54:27 AM) Alcohol Abuse Father. Cancer Mother. Depression Mother. Heart Disease Mother. Heart disease in female family member before age 32 Heart disease in female family member before age 87 Hypertension  Mother.  Pregnancy / Birth History Ariana Pilling, LPN; 0/62/3762 83:15 AM) Age at menarche 12 years. Contraceptive History Depo-provera. Gravida 2 Irregular periods Maternal age <15 Para 1     Review of Systems (Ariana Eversole LPN; 1/76/1607 37:10 AM) General Present- Appetite Loss and Fatigue. Not Present- Chills, Fever, Night Sweats, Weight Gain and Weight Loss. HEENT Present- Nose Bleed, Oral Ulcers and Wears glasses/contact lenses. Not Present- Earache, Hearing Loss, Hoarseness, Ringing in the Ears, Seasonal Allergies, Sinus Pain, Sore Throat, Visual Disturbances and Yellow Eyes. Respiratory Present- Difficulty Breathing. Not Present- Bloody sputum, Chronic Cough, Snoring and Wheezing. Cardiovascular Present- Difficulty Breathing Lying Down and Swelling of Extremities. Not Present- Chest Pain, Leg Cramps, Palpitations, Rapid Heart Rate and Shortness of Breath. Gastrointestinal Present- Abdominal Pain, Bloating, Change in Bowel Habits and Constipation. Not Present- Bloody Stool, Chronic diarrhea, Difficulty Swallowing, Excessive gas, Gets full quickly at meals, Hemorrhoids, Indigestion, Nausea, Rectal Pain and Vomiting. Female Genitourinary Present- Frequency and Nocturia. Not Present- Painful Urination, Pelvic Pain and Urgency. Neurological Present- Weakness. Not Present- Decreased Memory, Fainting, Headaches, Numbness, Seizures, Tingling, Tremor and Trouble walking. Psychiatric Present- Anxiety, Change in Sleep Pattern and Depression. Not Present- Bipolar, Fearful and Frequent crying. Endocrine Present- Hot flashes. Not Present- Cold Intolerance, Excessive Hunger, Hair Changes, Heat Intolerance and New Diabetes. Hematology Present- Easy Bruising. Not Present- Excessive bleeding, Gland problems, HIV and Persistent Infections.  Vitals (Ariana Eversole LPN; 03/16/9484 46:27 AM) 12/09/2015 11:07 AM Weight: 131.8 lb Height: 63in Body Surface Area: 1.62 m Body Mass Index: 23.35  kg/m  Temp.: 52F(Oral)  Pulse: 86 (Regular)  BP: 142/88 (Sitting, Left Arm, Standard)      Physical Exam Ariana Jensen K. Lacinda Curvin MD; 12/09/2015 11:31 AM)  The physical exam findings are as follows: Note:WDWN in NAD HEENT: EOMI, sclera anicteric Neck: No masses, no thyromegaly Lungs: CTA bilaterally; normal respiratory effort CV: Regular rate  and rhythm; no murmurs Abd: +bowel sounds, soft, non-tender, protruding umbilical hernia on the right side of the umbilicus - reducible Ext: Well-perfused; no edema Skin: Warm, dry; no sign of jaundice    Assessment & Plan Ariana Jensen(Beronica Lansdale K. Nolene Rocks MD; 12/09/2015 11:22 AM)  UMBILICAL HERNIA WITHOUT OBSTRUCTION OR GANGRENE (K42.9)  Current Plans Pt Education - Pamphlet Given - Hernia Surgery: discussed with patient and provided information. Schedule for Surgery - Umbilical hernia repair with mesh. The surgical procedure has been discussed with the patient. Potential risks, benefits, alternative treatments, and expected outcomes have been explained. All of the patient's questions at this time have been answered. The likelihood of reaching the patient's treatment goal is good. The patient understand the proposed surgical procedure and wishes to proceed.   Ariana ArmsMatthew K. Corliss Skainssuei, MD, [facility]  General/ Trauma Surgery  12/09/2015 11:32 AM

## 2015-12-09 NOTE — Telephone Encounter (Signed)
Pt called because she needs [facility] new referral to [facility] on [address]. She has a new number and also the orange card and we need to send them a referral before they will see her. She has been there before but the referral expired. jw

## 2015-12-10 NOTE — Telephone Encounter (Signed)
Referral placed.  Katina Degreealeb M. Jimmey RalphParker, MD [facility] Family Medicine Resident PGY-2 12/10/2015 5:17 PM

## 2016-01-02 ENCOUNTER — Other Ambulatory Visit: Payer: Self-pay | Admitting: Family Medicine

## 2016-01-02 ENCOUNTER — Other Ambulatory Visit: Payer: Self-pay | Admitting: *Deleted

## 2016-01-02 DIAGNOSIS — L299 Pruritus, unspecified: Secondary | ICD-10-CM

## 2016-01-02 MED ORDER — PERMETHRIN 5 % EX CREA: 1.0000 "application " | TOPICAL_CREAM | Freq: Once | CUTANEOUS | Status: DC

## 2016-01-02 NOTE — Addendum Note (Signed)
Addended by: Henri MedalHARTSELL, Rosaisela Jamroz M on: 01/02/2016 12:24 PM   Modules accepted: Orders

## 2016-01-02 NOTE — Telephone Encounter (Signed)
Patient called back and stated that script went to the wrong pharmacy.  Resent script to pharmacy requested by patient. Akeylah Hendel,CMA

## 2016-01-02 NOTE — Telephone Encounter (Signed)
Will forward to MD to advise.  Patient was seen in early March and was advised then by Dr. Kennon RoundsHaney to follow up with PCP in 2 weeks to discuss other treatment options for itching. Will forward to MD to confirm next steps for patient. Ariana Jensen,CMA

## 2016-01-02 NOTE — Telephone Encounter (Signed)
Rx filled.  Katina Degreealeb M. Jimmey RalphParker, MD [facility] Family Medicine Resident PGY-2 01/02/2016 9:51 AM

## 2016-01-02 NOTE — Telephone Encounter (Signed)
[facility] ref[facility] sent [facility] [address]

## 2016-01-02 NOTE — Telephone Encounter (Signed)
I sent in the permethrin cream. Patient needs to be evaluated soon if she thinks she has scabies or is still having significant itching.  Katina Degreealeb M. Jimmey RalphParker, MD [facility] Family Medicine Resident PGY-2 01/02/2016 10:51 AM

## 2016-01-02 NOTE — Telephone Encounter (Signed)
Pt informed and made appointment to see Dr. Pollie MeyerMcIntyre on Monday. Add Dinapoli, Maryjo RochesterJessica Dawn, CMA

## 2016-01-03 MED ORDER — HYDROXYZINE HCL 25 MG PO TABS: 25.0000 mg | ORAL_TABLET | Freq: Three times a day (TID) | ORAL | Status: DC | PRN

## 2016-01-03 NOTE — Telephone Encounter (Signed)
Rx filled.  Katina Degreealeb M. Jimmey RalphParker, MD [facility] Family Medicine Resident PGY-2 01/03/2016 11:11 AM

## 2016-01-06 ENCOUNTER — Ambulatory Visit: Payer: No Typology Code available for payment source | Admitting: Family Medicine

## 2016-01-07 ENCOUNTER — Encounter (HOSPITAL_BASED_OUTPATIENT_CLINIC_OR_DEPARTMENT_OTHER): Payer: Self-pay | Admitting: *Deleted

## 2016-01-09 ENCOUNTER — Encounter (HOSPITAL_BASED_OUTPATIENT_CLINIC_OR_DEPARTMENT_OTHER)
Admission: RE | Admit: 2016-01-09 | Discharge: 2016-01-09 | Disposition: A | Discharge status: Home / Self Care | Payer: No Typology Code available for payment source | Source: Ambulatory Visit | Attending: Surgery | Admitting: Surgery

## 2016-01-09 DIAGNOSIS — I10 Essential (primary) hypertension: Secondary | ICD-10-CM | POA: Insufficient documentation

## 2016-01-09 DIAGNOSIS — Z01812 Encounter for preprocedural laboratory examination: Secondary | ICD-10-CM | POA: Insufficient documentation

## 2016-01-09 LAB — BASIC METABOLIC PANEL
Anion gap: 10 (ref 5–15)
BUN: 24 mg/dL — AB (ref 6–20)
CALCIUM: 8.8 mg/dL — AB (ref 8.9–10.3)
CO2: 23 mmol/L (ref 22–32)
CREATININE: 0.69 mg/dL (ref 0.44–1.00)
Chloride: 106 mmol/L (ref 101–111)
GFR calc Af Amer: >60 mL/min (ref >60)
GLUCOSE: 68 mg/dL (ref 65–99)
POTASSIUM: 3.8 mmol/L (ref 3.5–5.1)
SODIUM: 139 mmol/L (ref 135–145)

## 2016-01-31 ENCOUNTER — Other Ambulatory Visit: Payer: Self-pay | Admitting: Family Medicine

## 2016-01-31 ENCOUNTER — Other Ambulatory Visit: Payer: Self-pay | Admitting: *Deleted

## 2016-01-31 NOTE — Telephone Encounter (Signed)
Patient is aware that script was denied.  I offered her an appt to be seen with us next week but she is unable to wait til then.  Patient was advised to be seen at the urgent care if the itching is too bad. Bennie Chirico,CMA

## 2016-01-31 NOTE — Telephone Encounter (Signed)
Will not fill Rx. Patient should not have scabies infection for this long - she has gotten 4 treatments this year already. Patient needs to be seen for evaluation.   Katina Degreealeb M. Jimmey RalphParker, MD [facility] Resident PGY-2 01/31/2016 2:19 PM

## 2016-02-01 ENCOUNTER — Encounter (HOSPITAL_COMMUNITY): Payer: Self-pay | Admitting: Emergency Medicine

## 2016-02-01 ENCOUNTER — Emergency Department (HOSPITAL_COMMUNITY)
Admission: EM | Admit: 2016-02-01 | Discharge: 2016-02-01 | Disposition: A | Discharge status: Home / Self Care | Payer: No Typology Code available for payment source | Attending: Emergency Medicine | Admitting: Emergency Medicine

## 2016-02-01 DIAGNOSIS — F17200 Nicotine dependence, unspecified, uncomplicated: Secondary | ICD-10-CM | POA: Insufficient documentation

## 2016-02-01 DIAGNOSIS — Z791 Long term (current) use of non-steroidal anti-inflammatories (NSAID): Secondary | ICD-10-CM | POA: Insufficient documentation

## 2016-02-01 DIAGNOSIS — Z79899 Other long term (current) drug therapy: Secondary | ICD-10-CM | POA: Insufficient documentation

## 2016-02-01 DIAGNOSIS — I10 Essential (primary) hypertension: Secondary | ICD-10-CM | POA: Insufficient documentation

## 2016-02-01 DIAGNOSIS — B86 Scabies: Secondary | ICD-10-CM | POA: Insufficient documentation

## 2016-02-01 DIAGNOSIS — L299 Pruritus, unspecified: Secondary | ICD-10-CM

## 2016-02-01 DIAGNOSIS — K219 Gastro-esophageal reflux disease without esophagitis: Secondary | ICD-10-CM | POA: Insufficient documentation

## 2016-02-01 MED ORDER — PERMETHRIN 5 % EX CREA: 1.0000 "application " | TOPICAL_CREAM | Freq: Once | CUTANEOUS | Status: DC

## 2016-02-01 MED ORDER — HYDROXYZINE HCL 25 MG PO TABS
25.0000 mg | ORAL_TABLET | Freq: Three times a day (TID) | ORAL | Status: DC | PRN
Start: 2016-02-01 — End: 2016-07-27

## 2016-02-01 NOTE — ED Notes (Signed)
Schlossman aware of pt blood pressure prior to discharge and states continue to discharge as ordered/planned.

## 2016-02-01 NOTE — ED Notes (Signed)
Pt complaint of generalized rash. Pt states "boyfriend has scabies again and I know that is what it is."

## 2016-02-01 NOTE — ED Notes (Signed)
Schlossman at bedside. 

## 2016-02-01 NOTE — ED Notes (Signed)
[facility] aware of pt status and complaint. 

## 2016-02-01 NOTE — Discharge Instructions (Signed)

## 2016-02-02 NOTE — ED Provider Notes (Signed)
CSN: [id number]     Arrival date & time 02/01/16  1404 History   First MD Initiated Contact with Patient 02/01/16 1434     Chief Complaint  Patient presents with  . Scabies      (Consider location/radiation/quality/duration/timing/severity/associated sxs/prior Treatment) HPI   50 year old female with history of scabies actually 4 months ago, presents with concern for return of scabies with severe pruritus and rash. Reports her boyfriend has similar symptoms and was diagnosed with scabies.  Rash worse around bilateral wrists and hands, however extend the arms, located over back, chest and legs. Vistaril has worked for itching in past. Tried benadryl this time with minimal relief.   Past Medical History  Diagnosis Date  . Tuberculosis     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 age 50, now s/p 1-yr of multidrug treatment (dates unknown)  . Hypertension   . Hepatitis C antibody test positive   . ASCUS (atypical squamous cells of undetermined significance) on Pap smear   . Opioid dependence (HCC)   . Trichomonas 04/17/2011    Diagnosed 04/02/11 during hospitalization, treated with Flagyl 2g, patient instructed to have partner treated (must follow-up)   . Hepatitis     Hep C  . GERD (gastroesophageal reflux disease)   . Cocaine abuse     crack cocaine   Past Surgical History  Procedure Laterality Date  . Cesarean section  1984  . Salpingectomy Left March 2010    ectopic pregnancy  . Dilation and curettage of uterus    . I&d extremity  10/18/2011    Procedure: IRRIGATION AND DEBRIDEMENT EXTREMITY;  Surgeon: Sharma CovertFred W Ortmann, MD;  [facility];  Service: Orthopedics;  Laterality: Left;  . I&d extremity  10/21/2011    Procedure: IRRIGATION AND DEBRIDEMENT EXTREMITY;  Surgeon: Sharma CovertFred W Ortmann, MD;  Location: [facility] OR;  Service: Orthopedics;  Laterality: Left;   Family History  Problem Relation Age of Onset  . Heart attack Mother   . Heart attack Father   . Heart attack Paternal  Grandmother   . Heart attack Paternal Grandfather   . Cirrhosis Brother    Social History  Substance Use Topics  . Smoking status: Current Every Day Smoker -- 1.00 packs/day for 20 years  . Smokeless tobacco: Never Used  . Alcohol Use: 0.0 oz/week    0 Standard drinks or equivalent per week     Comment: social   OB History    No data available     Review of Systems  Constitutional: Negative for fever.  HENT: Negative for sore throat.   Eyes: Negative for visual disturbance.  Respiratory: Negative for cough and shortness of breath.   Cardiovascular: Negative for chest pain.  Gastrointestinal: Negative for abdominal pain.  Genitourinary: Negative for difficulty urinating.  Musculoskeletal: Negative for back pain and neck pain.  Skin: Positive for rash.  Neurological: Negative for syncope and headaches.      Allergies  Review of patient's allergies indicates no known allergies.  Home Medications   Prior to Admission medications   Medication Sig Start Date End Date Taking? Authorizing Provider  docusate sodium (COLACE) 100 MG capsule Take 1 capsule (100 mg total) by mouth 2 (two) times daily. Patient taking differently: Take 100 mg by mouth 2 (two) times daily as needed for mild constipation.  11/12/15   Yolande Jollyaleb G Melancon, MD  gabapentin (NEURONTIN) 100 MG capsule Take 1 capsule (100 mg total) by mouth 3 (three) times daily. 09/10/15   Smitty CordsAlexander J Karamalegos, DO  hydrochlorothiazide (HYDRODIURIL) 25 MG tablet TAKE ONE TABLET BY MOUTH ONCE DAILY 04/10/15   Ardith Dark, MD  hydrOXYzine (ATARAX/VISTARIL) 25 MG tablet Take 1 tablet (25 mg total) by mouth every 8 (eight) hours as needed for itching. 02/01/16   Alvira Monday, MD  ibuprofen (ADVIL,MOTRIN) 800 MG tablet Take 1 tablet (800 mg total) by mouth every 8 (eight) hours as needed. Patient taking differently: Take 800 mg by mouth every 8 (eight) hours as needed for mild pain or moderate pain.  10/10/15   Ardith Dark, MD   lactulose (CHRONULAC) 10 GM/15ML solution Take 15 mLs (10 g total) by mouth 2 (two) times daily as needed for mild constipation. Take with the miralax if you do not experience relief on day 1. 11/12/15   Yolande Jolly, MD  lisinopril (PRINIVIL,ZESTRIL) 20 MG tablet Take 1 tablet (20 mg total) by mouth daily. 09/10/15   Smitty Cords, DO  methadone (DOLOPHINE) 5 MG/5ML solution Take 80 mg by mouth every morning.     Historical Provider, MD  omeprazole (PRILOSEC) 20 MG capsule Take 1 capsule (20 mg total) by mouth daily. Patient taking differently: Take 20 mg by mouth daily as needed (heartburn/acid reflux).  11/02/15   Audry Pili, PA-C  permethrin (ELIMITE) 5 % cream Apply 1 application topically once. Thoroughly massage cream from head to soles of feet, leave on for 8-14 hours before washing off with water, repeat application in two weeks as needed for continued symptoms. 02/01/16   Alvira Monday, MD  polyethylene glycol powder (GLYCOLAX/MIRALAX) powder Take 34 g by mouth 2 (two) times daily as needed. Take until you have a bowel movement. 11/12/15   Hillery Hunter Melancon, MD  triamcinolone cream (KENALOG) 0.1 % Apply 1 application topically 2 (two) times daily. 11/17/15   Tyger Tripp Dowless, PA-C   BP 189/123 mmHg  Pulse 82  Temp(Src) 98 F (36.7 C) (Oral)  Resp 20  SpO2 97%  LMP 12/06/2015 Physical Exam  Constitutional: She is oriented to person, place, and time. She appears well-developed and well-nourished. No distress.  HENT:  Head: Normocephalic and atraumatic.  Eyes: Conjunctivae and EOM are normal.  Neck: Normal range of motion.  Cardiovascular: Normal rate, regular rhythm, normal heart sounds and intact distal pulses.  Exam reveals no gallop and no friction rub.   No murmur heard. Pulmonary/Chest: Effort normal and breath sounds normal. No respiratory distress. She has no wheezes. She has no rales.  Abdominal: Soft. She exhibits no distension. There is no tenderness.  There is no guarding.  Musculoskeletal: She exhibits no edema or tenderness.  Neurological: She is alert and oriented to person, place, and time.  Skin: Skin is warm and dry. No rash noted. She is not diaphoretic. No erythema.  Small papules over hands near web-spaces, and over forearms and chest, areas with excoriations, no surrounding erythema   Nursing note and vitals reviewed.   ED Course  Procedures (including critical care time) Labs Review Labs Reviewed - No data to display  Imaging Review No results found. I have personally reviewed and evaluated these images and lab results as part of my medical decision-making.   EKG Interpretation None      MDM   Final diagnoses:  Scabies   49 year old female with history of scabies actually 4 months ago, presents with concern for return of scabies with severe pruritus and rash. Reports her boyfriend has similar symptoms and was diagnosed with scabies. Exam with excoriations, and small papules which  may be consistent with scabies. No sign of surrounding cellulitis, no abscess, no sign of staph scalded skin syndrome.  Patient given prescription for permethrin and Vistaril. Discussed scabies care. Patient discharged in stable condition with understanding of reasons to return.    Alvira Monday, MD 02/02/16 [phone]

## 2016-02-11 ENCOUNTER — Ambulatory Visit: Payer: No Typology Code available for payment source | Admitting: Family Medicine

## 2016-02-12 ENCOUNTER — Ambulatory Visit (HOSPITAL_BASED_OUTPATIENT_CLINIC_OR_DEPARTMENT_OTHER)
Admission: RE | Admit: 2016-02-12 | Payer: No Typology Code available for payment source | Source: Ambulatory Visit | Admitting: Surgery

## 2016-02-12 HISTORY — DX: Gastro-esophageal reflux disease without esophagitis: K21.9

## 2016-02-12 HISTORY — DX: Cocaine abuse, uncomplicated: F14.10

## 2016-02-12 SURGERY — REPAIR, HERNIA, UMBILICAL, ADULT
Anesthesia: General

## 2016-03-03 ENCOUNTER — Encounter: Payer: Self-pay | Admitting: Family Medicine

## 2016-03-03 ENCOUNTER — Ambulatory Visit (HOSPITAL_COMMUNITY)
Admission: RE | Admit: 2016-03-03 | Discharge: 2016-03-03 | Disposition: A | Discharge status: Home / Self Care | Payer: No Typology Code available for payment source | Source: Ambulatory Visit | Attending: Obstetrics and Gynecology | Admitting: Obstetrics and Gynecology

## 2016-03-03 ENCOUNTER — Ambulatory Visit (INDEPENDENT_AMBULATORY_CARE_PROVIDER_SITE_OTHER): Payer: No Typology Code available for payment source | Admitting: Family Medicine

## 2016-03-03 VITALS — BP 158/107 | HR 73 | Temp 97.9°F | Wt 133.0 lb

## 2016-03-03 DIAGNOSIS — R079 Chest pain, unspecified: Secondary | ICD-10-CM | POA: Insufficient documentation

## 2016-03-03 DIAGNOSIS — M6248 Contracture of muscle, other site: Secondary | ICD-10-CM

## 2016-03-03 DIAGNOSIS — R9431 Abnormal electrocardiogram [ECG] [EKG]: Secondary | ICD-10-CM | POA: Insufficient documentation

## 2016-03-03 DIAGNOSIS — M62838 Other muscle spasm: Secondary | ICD-10-CM

## 2016-03-03 MED ORDER — TIZANIDINE HCL 2 MG PO CAPS: 2.0000 mg | ORAL_CAPSULE | Freq: Three times a day (TID) | ORAL | Status: DC | PRN

## 2016-03-03 NOTE — Patient Instructions (Signed)
The neck pain is most likely a muscle spasm. This can happen for a variety of reasons and may take a while to go away. The three things you need to primarily focus on: STRETCHING, MASSAGE, WARM COMPRESSES. Only use the muscle relaxer sparingly, when pain is really severe, and when unable to do the above 3.   Your EKG was okay today. This does not rule out your heart as the cause of the chest pain. You need to go to the ED immediately if this worsens.  Cocaine use can put you at risk for developing chest pains and causing heart attacks. If you continue to use this you are putting yourself at even more risk.  Schedule an appointment with Dr. Jimmey RalphParker to discuss your blood pressure. It is better today but still not at goal. This also puts you at very high risk for developing heart disease.

## 2016-03-03 NOTE — Progress Notes (Signed)
   Subjective:    Patient ID: Ariana Jensen, female    DOB: [birth year], 50 y.o.   MRN: [id number]  HPI  Patient presents for Same Day Appointment  CC: chest pains  # Chest pains:  Started about 1 week ago, much worse in last 3 days  Center of chest, feels like something is hitting her in the chest, a pressure.  Started radiating up to the left neck 3 days ago, which is still there. The neck pain is what is bothering her the most  Pain has only ever gone away for about an hour after taking "6 ibuprofen"  Feels like she is very fatigued  Laying down makes the neck pain worse.   Still smoking  Has not been sleeping well  Recently went down to methadone 75mg  once a week yesterday, has been on 80mg  for past 2 years)  ROS: feels short of breath, very minor headache near neck pain  Social Hx: current smoker. Current cocaine use  Review of Systems   See HPI for ROS.   Past medical history, surgical, family, and social history reviewed and updated in the EMR as appropriate.  Objective:  BP 158/107 mmHg  Pulse 73  Temp(Src) 97.9 F (36.6 C) (Oral)  Wt 133 lb (60.328 kg)  SpO2 99%  LMP 10/23/2015 Vitals and nursing note reviewed  General: no apparent distress, does appear a little anxious Neck: has tender to palpation left trapezius going into the neck with spasm noted CV: normal rate, regular rhythm, no murmurs, rubs or gallop  Resp: mildly coarse breath sounds bilaterally, normal effort and rate  Assessment & Plan:  1. Chest pain, unspecified chest pain type She has significant risk factors including strong family history, smoking, cocaine use. Current chest pain is not as concerning to her as her neck pain, says it is not very bad and is not made worse with exertion. She underwent initial cardiac work up last fall with an intermediate risk stress test but this was determined to be likely an artifact after repeating an echo which was normal. She was released from  cardiology follow up at that time with return as needed. Her EKG today appears unchanged (still has prolonged QT). She endorses cocaine use in the past 4 weeks but not in past week, discussed this is a risk factor for heart attacks. Her BP is also elevated but actually better than it typically is, she says usually in 200s systolic. Discussed return precautions and go to ED immediately for worsening chest pain.  - EKG 12-Lead  2. Muscle spasms of neck Discussed mainstay treatment being stretching, massage, warm compresses. Will give a short supply of muscle relaxer to use only when very severe and unable to do the above.   Return in about 2 weeks (around 03/17/2016). with PCP for HTN follow up

## 2016-03-14 ENCOUNTER — Other Ambulatory Visit: Payer: Self-pay | Admitting: Family Medicine

## 2016-03-16 NOTE — Telephone Encounter (Signed)
Rx filled.  Katina Degreealeb M. Jimmey RalphParker, MD [facility] Resident PGY-2 03/16/2016 10:27 AM

## 2016-04-02 ENCOUNTER — Other Ambulatory Visit: Payer: Self-pay | Admitting: *Deleted

## 2016-04-02 DIAGNOSIS — I10 Essential (primary) hypertension: Secondary | ICD-10-CM

## 2016-04-02 MED ORDER — HYDROCHLOROTHIAZIDE 25 MG PO TABS: 25.0000 mg | ORAL_TABLET | Freq: Every day | ORAL | Status: DC

## 2016-04-02 MED ORDER — LISINOPRIL 20 MG PO TABS: 20.0000 mg | ORAL_TABLET | Freq: Every day | ORAL | Status: DC

## 2016-04-02 NOTE — Telephone Encounter (Signed)
Rx filled.  Ariana Degreealeb M. Jimmey RalphParker, MD [facility] Resident PGY-3 04/02/2016 2:32 PM

## 2016-04-10 ENCOUNTER — Ambulatory Visit: Payer: No Typology Code available for payment source

## 2016-04-20 ENCOUNTER — Ambulatory Visit: Payer: No Typology Code available for payment source | Admitting: Family Medicine

## 2016-04-27 ENCOUNTER — Other Ambulatory Visit: Payer: Self-pay | Admitting: Family Medicine

## 2016-05-07 ENCOUNTER — Ambulatory Visit: Payer: No Typology Code available for payment source | Admitting: Family Medicine

## 2016-05-12 ENCOUNTER — Ambulatory Visit: Payer: No Typology Code available for payment source

## 2016-05-14 ENCOUNTER — Ambulatory Visit: Payer: No Typology Code available for payment source | Admitting: Student

## 2016-05-18 ENCOUNTER — Ambulatory Visit: Payer: No Typology Code available for payment source | Attending: Internal Medicine

## 2016-06-08 ENCOUNTER — Ambulatory Visit: Payer: Self-pay | Admitting: Surgery

## 2016-06-08 NOTE — H&P (Signed)
  History of Present Illness Wilmon Arms(Bates Collington K. Paisli Silfies MD; 06/08/2016 10:33 AM) The patient is a 50 year old female who presents with an umbilical hernia. This is a 50 year old female who presents with several months of an enlarging umbilical hernia. Discloses some discomfort. He remains reducible. It has enlarged slightly since she first discovered it. She does not remember any initiating event.  The patient has some history of substance abuse and is on chronic methadone. She states that she has been clean. She does smoke cigarettes but does not drink any alcohol.  She was originally seen for this problem back in March but failed to schedule her surgery for various reasons. The hernia has become slightly larger and she is beginning to have occasional digestive difficulties. It remains reducible.   Problem List/Past Medical Wynona Luna(Zorana Brockwell K Dania Marsan, MD; 06/08/2016 10:33 AM) UMBILICAL HERNIA WITHOUT OBSTRUCTION OR GANGRENE (K42.9)  Other Problems Wynona Luna(Ples Trudel K Elissia Spiewak, MD; 06/08/2016 10:33 AM) Hepatitis Hemorrhoids Depression Gastroesophageal Reflux Disease  Past Surgical History Wynona Luna(Marcianne Ozbun K August Gosser, MD; 06/08/2016 10:33 AM) Cesarean Section - 1  Diagnostic Studies History Wynona Luna(Kyann Heydt K Nazeer Romney, MD; 06/08/2016 10:33 AM) Pap Smear >5 years ago  Allergies Lamar Laundry(Sonya Bynum, CMA; 06/08/2016 10:14 AM) No Known Drug Allergies 12/09/2015  Medication History (Sonya Bynum, CMA; 06/08/2016 10:14 AM) Vistaril (25MG  Capsule, Oral) Active. HydroCHLOROthiazide (25MG  Tablet, Oral) Active. Lactulose (10GM/15ML Solution, Oral) Active. Lisinopril (20MG  Tablet, Oral) Active. Methadone HCl (5MG /5ML Solution, Oral) Active. PriLOSEC (20MG  Capsule DR, Oral) Active. MiraLax (Oral) Active. Medications Reconciled  Social History Wynona Luna(Suellyn Meenan K Dock Baccam, MD; 06/08/2016 10:33 AM) Illicit drug use Remotely quit drug use. Tobacco use Former smoker. Alcohol use Occasional alcohol use. Caffeine use Carbonated beverages,  Coffee.  Family History Wynona Luna(Aarion Metzgar K Madix Blowe, MD; 06/08/2016 10:33 AM) Cancer Mother. Depression Mother. Alcohol Abuse Father. Heart Disease Mother. Hypertension Mother. Heart disease in female family member before age 49 Heart disease in female family member before age 49  Pregnancy / Birth History Wynona Luna(Batoul Limes K Dayon Witt, MD; 06/08/2016 10:33 AM) Age at menarche 12 years. Maternal age <15 Para 1 Contraceptive History Depo-provera. Gravida 2 Irregular periods    Vitals (Sonya Bynum CMA; 06/08/2016 10:14 AM) 06/08/2016 10:13 AM Weight: 147 lb Height: 63in Body Surface Area: 1.7 m Body Mass Index: 26.04 kg/m  Temp.: 97.78F(Temporal)  Pulse: 81 (Regular)  BP: 160/96 (Sitting, Left Arm, Standard)      Physical Exam Molli Hazard(Neo Yepiz K. Kaisha Wachob MD; 06/08/2016 10:36 AM)  The physical exam findings are as follows: Note:WDWN in NAD Eyes: Pupils equal, round; sclera anicteric HENT: Oral mucosa moist; good dentition Neck: No masses palpated, no thyromegaly Lungs: CTA bilaterally; normal respiratory effort CV: Regular rate and rhythm; no murmurs; extremities well-perfused with no edema Abd: +bowel sounds, soft, non-tender, no palpable organomegaly; visible palpable umbilical hernia - reducible when supine Skin: Warm, dry; no sign of jaundice Psychiatric - alert and orientated x 4; calm mood and affect    Assessment & Plan Molli Hazard(Khyler Eschmann K. Nilton Lave MD; 06/08/2016 10:23 AM)  UMBILICAL HERNIA WITHOUT OBSTRUCTION OR GANGRENE (K42.9)  Current Plans Schedule for Surgery - Umbilical hernia repair with mesh. The surgical procedure has been discussed with the patient. Potential risks, benefits, alternative treatments, and expected outcomes have been explained. All of the patient's questions at this time have been answered. The likelihood of reaching the patient's treatment goal is good. The patient understand the proposed surgical procedure and wishes to proceed.  Wilmon ArmsMatthew K. Corliss Skainssuei, MD,  [facility]  General/ Trauma Surgery  06/08/2016 10:36 AM

## 2016-06-15 ENCOUNTER — Encounter (HOSPITAL_COMMUNITY): Payer: Self-pay | Admitting: Emergency Medicine

## 2016-06-15 ENCOUNTER — Emergency Department (HOSPITAL_COMMUNITY): Payer: No Typology Code available for payment source

## 2016-06-15 ENCOUNTER — Emergency Department (HOSPITAL_COMMUNITY)
Admission: EM | Admit: 2016-06-15 | Discharge: 2016-06-15 | Disposition: A | Discharge status: Home / Self Care | Payer: No Typology Code available for payment source | Attending: Dermatology | Admitting: Dermatology

## 2016-06-15 DIAGNOSIS — Z5321 Procedure and treatment not carried out due to patient leaving prior to being seen by health care provider: Secondary | ICD-10-CM | POA: Insufficient documentation

## 2016-06-15 DIAGNOSIS — R1013 Epigastric pain: Secondary | ICD-10-CM | POA: Insufficient documentation

## 2016-06-15 DIAGNOSIS — F17200 Nicotine dependence, unspecified, uncomplicated: Secondary | ICD-10-CM | POA: Insufficient documentation

## 2016-06-15 DIAGNOSIS — F1490 Cocaine use, unspecified, uncomplicated: Secondary | ICD-10-CM | POA: Insufficient documentation

## 2016-06-15 DIAGNOSIS — F1110 Opioid abuse, uncomplicated: Secondary | ICD-10-CM | POA: Insufficient documentation

## 2016-06-15 DIAGNOSIS — I10 Essential (primary) hypertension: Secondary | ICD-10-CM | POA: Insufficient documentation

## 2016-06-15 DIAGNOSIS — Z79899 Other long term (current) drug therapy: Secondary | ICD-10-CM | POA: Insufficient documentation

## 2016-06-15 LAB — BASIC METABOLIC PANEL
ANION GAP: 11 (ref 5–15)
BUN: 14 mg/dL (ref 6–20)
CHLORIDE: 105 mmol/L (ref 101–111)
CO2: 22 mmol/L (ref 22–32)
Calcium: 9.1 mg/dL (ref 8.9–10.3)
Creatinine, Ser: 0.68 mg/dL (ref 0.44–1.00)
GFR calc Af Amer: >60 mL/min (ref >60)
GLUCOSE: 76 mg/dL (ref 65–99)
POTASSIUM: 3.8 mmol/L (ref 3.5–5.1)
Sodium: 138 mmol/L (ref 135–145)

## 2016-06-15 LAB — I-STAT TROPONIN, ED: TROPONIN I, POC: 0.01 ng/mL (ref 0.00–0.08)

## 2016-06-15 LAB — CBC
HEMATOCRIT: 36.6 % (ref 36.0–46.0)
HEMOGLOBIN: 11.8 g/dL — AB (ref 12.0–15.0)
MCH: 28.8 pg (ref 26.0–34.0)
MCHC: 32.2 g/dL (ref 30.0–36.0)
MCV: 89.3 fL (ref 78.0–100.0)
Platelets: 257 10*3/uL (ref 150–400)
RBC: 4.1 MIL/uL (ref 3.87–5.11)
RDW: 17.5 % — ABNORMAL HIGH (ref 11.5–15.5)
WBC: 9.9 10*3/uL (ref 4.0–10.5)

## 2016-06-15 NOTE — ED Notes (Signed)
Not found in lobby  

## 2016-06-15 NOTE — ED Triage Notes (Signed)
Patient c/o epigastric pain for over onth that is intermittent.  Patient states when she lies flat or sits up forward she gets SOB. Patient doesn't kow if it's related to her umbilical hernia that she is scheduled to have surgery on in Oct.

## 2016-06-16 ENCOUNTER — Ambulatory Visit: Payer: No Typology Code available for payment source | Admitting: Student

## 2016-06-19 ENCOUNTER — Ambulatory Visit: Payer: No Typology Code available for payment source | Admitting: Obstetrics and Gynecology

## 2016-06-19 ENCOUNTER — Encounter (HOSPITAL_BASED_OUTPATIENT_CLINIC_OR_DEPARTMENT_OTHER): Payer: Self-pay | Admitting: *Deleted

## 2016-06-22 ENCOUNTER — Encounter (HOSPITAL_COMMUNITY): Payer: Self-pay | Admitting: *Deleted

## 2016-06-22 ENCOUNTER — Ambulatory Visit (HOSPITAL_COMMUNITY)
Admission: EM | Admit: 2016-06-22 | Discharge: 2016-06-22 | Disposition: A | Discharge status: Home / Self Care | Payer: No Typology Code available for payment source | Attending: Internal Medicine | Admitting: Internal Medicine

## 2016-06-22 ENCOUNTER — Encounter (HOSPITAL_BASED_OUTPATIENT_CLINIC_OR_DEPARTMENT_OTHER): Payer: Self-pay | Admitting: *Deleted

## 2016-06-22 DIAGNOSIS — B86 Scabies: Secondary | ICD-10-CM

## 2016-06-22 MED ORDER — PREDNISONE 50 MG PO TABS: 50.0000 mg | ORAL_TABLET | Freq: Every day | ORAL | 0 refills | Status: DC

## 2016-06-22 MED ORDER — PERMETHRIN 5 % EX CREA: 1.0000 "application " | TOPICAL_CREAM | Freq: Once | CUTANEOUS | 0 refills | Status: AC

## 2016-06-22 NOTE — ED Provider Notes (Signed)
[facility]    CSN: [id number] Arrival date & time: 06/22/16  1410     History   Chief Complaint Chief Complaint  Patient presents with  . Exposure to STD    HPI Ariana BaloSamantha Jensen is a 50 y.o. female. She presents today with itching all over, particularly in her groins, pubic area, under her arms, under her breasts, and around her hairline in the back of her neck. She lives in a boarding house, and states that this is a problem widespread in the boarding house. She is scheduled to have hernia surgery later this week. No unusual vaginal discharge or bleeding, no dysuria, no other symptoms to report.  HPI  Past Medical History:  Diagnosis Date  . ASCUS (atypical squamous cells of undetermined significance) on Pap smear   . Cocaine abuse    crack cocaine  . GERD (gastroesophageal reflux disease)   . Hepatitis    Hep C  . Hepatitis C antibody test positive   . Hypertension   . Opioid dependence (HCC)   . Trichomonas 04/17/2011   Diagnosed 04/02/11 during hospitalization, treated with Flagyl 2g, patient instructed to have partner treated (must follow-up)   . Tuberculosis    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 age 50, now s/p 1-yr of multidrug treatment (dates unknown)    Patient Active Problem List   Diagnosis Date Noted  . Nausea 10/04/2015  . Pruritic dermatitis 09/10/2015  . Rash and nonspecific skin eruption 08/21/2015  . Body lice 08/05/2015  . Prolonged QT interval 07/18/2015  . Dysmenorrhea 04/10/2015  . Trichimoniasis 04/10/2015  . Chest pain 03/07/2015  . Healthcare maintenance 03/07/2015  . RLQ abdominal pain 02/18/2012  . GERD (gastroesophageal reflux disease) 01/28/2012  . Menorrhagia 01/25/2012  . Weakness generalized 12/05/2011  . Tobacco abuse 11/06/2011  . HTN (hypertension) 10/22/2011  . Abscess and cellulitis 10/22/2011  . Hepatitis C, chronic (HCC) 10/22/2011  . History of TB (tuberculosis) 10/22/2011  . Opioid dependence (HCC)   .  Polysubstance abuse 04/17/2011    Past Surgical History:  Procedure Laterality Date  . CESAREAN SECTION  1984  . DILATION AND CURETTAGE OF UTERUS    . I&D EXTREMITY  10/18/2011   Procedure: IRRIGATION AND DEBRIDEMENT EXTREMITY;  Surgeon: Sharma CovertFred W Ortmann, MD;  Location: [facility] OR;  Service: Orthopedics;  Laterality: Left;  . I&D EXTREMITY  10/21/2011   Procedure: IRRIGATION AND DEBRIDEMENT EXTREMITY;  Surgeon: Sharma CovertFred W Ortmann, MD;  Location: [facility] OR;  Service: Orthopedics;  Laterality: Left;  . SALPINGECTOMY Left March 2010   ectopic pregnancy     Home Medications    Prior to Admission medications   Medication Sig Start Date End Date Taking? Authorizing Provider  hydrochlorothiazide (HYDRODIURIL) 25 MG tablet Take 1 tablet (25 mg total) by mouth daily. 04/02/16  Yes Ardith Darkaleb M Parker, MD  hydrOXYzine (ATARAX/VISTARIL) 25 MG tablet Take 1 tablet (25 mg total) by mouth every 8 (eight) hours as needed for itching. 02/01/16  Yes Alvira MondayErin Schlossman, MD  lactulose (CHRONULAC) 10 GM/15ML solution Take 15 mLs (10 g total) by mouth 2 (two) times daily as needed for mild constipation. Take with the miralax if you do not experience relief on day 1. 11/12/15  Yes Yolande Jollyaleb G Melancon, MD  lisinopril (PRINIVIL,ZESTRIL) 20 MG tablet Take 1 tablet (20 mg total) by mouth daily. 04/02/16  Yes Ardith Darkaleb M Parker, MD  methadone (DOLOPHINE) 5 MG/5ML solution Take 80 mg by mouth every morning.    Yes Historical Provider, MD  docusate  sodium (COLACE) 100 MG capsule Take 1 capsule (100 mg total) by mouth 2 (two) times daily. Patient taking differently: Take 100 mg by mouth 2 (two) times daily as needed for mild constipation.  11/12/15   Yolande Jolly, MD  ibuprofen (ADVIL,MOTRIN) 800 MG tablet Take 1 tablet (800 mg total) by mouth every 8 (eight) hours as needed. Patient taking differently: Take 800 mg by mouth every 8 (eight) hours as needed for mild pain or moderate pain.  10/10/15   Ardith Dark, MD  permethrin (ELIMITE) 5 % cream  Apply 1 application topically once. Thoroughly massage cream from head to soles of feet, leave on for 8-14 hours before washing off with water, repeat application in two weeks as needed for continued symptoms. 06/22/16 06/22/16  Eustace Moore, MD  polyethylene glycol powder (GLYCOLAX/MIRALAX) powder Take 34 g by mouth 2 (two) times daily as needed. Take until you have a bowel movement. 11/12/15   Yolande Jolly, MD  predniSONE (DELTASONE) 50 MG tablet Take 1 tablet (50 mg total) by mouth daily. 06/22/16   Eustace Moore, MD  tizanidine (ZANAFLEX) 2 MG capsule Take 1 capsule (2 mg total) by mouth 3 (three) times daily as needed for muscle spasms. 03/03/16   Nani Ravens, MD  triamcinolone cream (KENALOG) 0.1 % Apply 1 application topically 2 (two) times daily. 11/17/15   Dub Mikes, PA-C    Family History Family History  Problem Relation Age of Onset  . Heart attack Mother   . Heart attack Father   . Heart attack Paternal Grandmother   . Heart attack Paternal Grandfather   . Cirrhosis Brother     Social History Social History  Substance Use Topics  . Smoking status: Current Every Day Smoker    Packs/day: 1.00    Years: 20.00  . Smokeless tobacco: Never Used  . Alcohol use 0.0 oz/week     Comment: social     Allergies   Review of patient's allergies indicates no known allergies.   Review of Systems Review of Systems  All other systems reviewed and are negative.    Physical Exam Triage Vital Signs ED Triage Vitals [06/22/16 1457]  Enc Vitals Group     BP 127/91     Pulse Rate 81     Resp 12     Temp 98.1 F (36.7 C)     Temp Source Oral     SpO2 100 %     Weight      Height    Updated Vital Signs BP 127/91 (BP Location: Left Arm)   Pulse 81   Temp 98.1 F (36.7 C) (Oral)   Resp 12   LMP 10/23/2015   SpO2 100%  Physical Exam  Constitutional: She is oriented to person, place, and time. No distress.  Alert, nicely groomed  HENT:  Head: Atraumatic.   Eyes:  Conjugate gaze, no eye redness/drainage  Neck: Neck supple.  Cardiovascular: Normal rate.   Pulmonary/Chest: No respiratory distress.  Abdominal: She exhibits no distension.  Musculoskeletal: Normal range of motion.  No leg swelling  Neurological: She is alert and oriented to person, place, and time.  Skin: Skin is warm and dry.  No cyanosis  Nursing note and vitals reviewed.  UC Treatments / Results   Procedures Procedures (including critical care time)      None today  Final Clinical Impressions(s) / UC Diagnoses   Final diagnoses:  Scabies   Use elimite (permethrin) lotion  as directed.  If you need to take the prednisone for itching, let your hernia surgeon know so that the hernia surgery can be rescheduled.  Recheck or followup with primary care provider, Jacquiline Doe, as needed.    New Prescriptions Discharge Medication List as of 06/22/2016  3:58 PM    START taking these medications   Details  predniSONE (DELTASONE) 50 MG tablet Take 1 tablet (50 mg total) by mouth daily., Starting Mon 06/22/2016, Normal    elimite cream apply as directed     Eustace Moore, MD 06/23/16 1231

## 2016-06-22 NOTE — Discharge Instructions (Addendum)
Use elimite (permethrin) lotion as directed.  If you need to take the prednisone for itching, let your hernia surgeon know so that the hernia surgery can be rescheduled.  Recheck or followup with primary care provider, Jacquiline Doealeb Parker, as needed.

## 2016-06-22 NOTE — ED Triage Notes (Signed)
Patient reports pubic crabs found at boarding house she stays at, reports itching. Came today for treatment.

## 2016-06-25 ENCOUNTER — Ambulatory Visit (HOSPITAL_BASED_OUTPATIENT_CLINIC_OR_DEPARTMENT_OTHER)
Admission: RE | Admit: 2016-06-25 | Payer: No Typology Code available for payment source | Source: Ambulatory Visit | Admitting: Surgery

## 2016-06-25 SURGERY — REPAIR, HERNIA, UMBILICAL, ADULT
Anesthesia: General

## 2016-06-25 MED ORDER — DEXAMETHASONE SODIUM PHOSPHATE 10 MG/ML IJ SOLN
INTRAMUSCULAR | Status: AC
  Filled 2016-06-25: qty 1

## 2016-06-25 MED ORDER — ONDANSETRON HCL 4 MG/2ML IJ SOLN
INTRAMUSCULAR | Status: AC
  Filled 2016-06-25: qty 2

## 2016-06-25 MED ORDER — ARTIFICIAL TEARS OP OINT
TOPICAL_OINTMENT | OPHTHALMIC | Status: AC
  Filled 2016-06-25: qty 3.5

## 2016-06-25 MED ORDER — FENTANYL CITRATE (PF) 100 MCG/2ML IJ SOLN
INTRAMUSCULAR | Status: AC
  Filled 2016-06-25: qty 2

## 2016-06-25 MED ORDER — MIDAZOLAM HCL 2 MG/2ML IJ SOLN
INTRAMUSCULAR | Status: AC
  Filled 2016-06-25: qty 2

## 2016-06-25 MED ORDER — LIDOCAINE 2% (20 MG/ML) 5 ML SYRINGE
INTRAMUSCULAR | Status: AC
  Filled 2016-06-25: qty 5

## 2016-06-25 MED ORDER — GLYCOPYRROLATE 0.2 MG/ML IV SOSY
PREFILLED_SYRINGE | INTRAVENOUS | Status: AC
  Filled 2016-06-25: qty 3

## 2016-07-06 ENCOUNTER — Ambulatory Visit: Payer: No Typology Code available for payment source | Admitting: Family Medicine

## 2016-07-07 ENCOUNTER — Other Ambulatory Visit (HOSPITAL_COMMUNITY): Payer: Self-pay | Admitting: *Deleted

## 2016-07-07 ENCOUNTER — Encounter (HOSPITAL_COMMUNITY): Payer: Self-pay | Admitting: Emergency Medicine

## 2016-07-07 NOTE — Pre-Procedure Instructions (Signed)
Ariana Jensen  07/07/2016    Your procedure is scheduled on Tuesday, July 14, 2016 at 11:00 AM.   Report to [facility] Entrance "A" Admitting Office at 9:00 AM.   Call this number if you have problems the morning of surgery: [phone]   Questions prior to day of surgery, please call [phone] between 8 & 4 PM.   Remember:  Do not eat food or drink liquids after midnight Monday, 07/13/16.   Take these medicines the morning of surgery with A SIP OF WATER: Methodone, Ranitidine (Zantac)   Do not wear jewelry, make-up or nail polish.  Do not wear lotions, powders, or perfumes.  Do not shave 48 hours prior to surgery.    Do not bring valuables to the hospital.  [facility] is not responsible for any belongings or valuables.  Contacts, dentures or bridgework may not be worn into surgery.  Leave your suitcase in the car.  After surgery it may be brought to your room.  For patients admitted to the hospital, discharge time will be determined by your treatment team.  Patients discharged the day of surgery will not be allowed to drive home.   Special instructions:  [facility] - Preparing for Surgery  Before surgery, you can play an important role.  Because skin is not sterile, your skin needs to be as free of germs as possible.  You can reduce the number of germs on you skin by washing with CHG (chlorahexidine gluconate) soap before surgery.  CHG is an antiseptic cleaner which kills germs and bonds with the skin to continue killing germs even after washing.  Please DO NOT use if you have an allergy to CHG or antibacterial soaps.  If your skin becomes reddened/irritated stop using the CHG and inform your nurse when you arrive at Short Stay.  Do not shave (including legs and underarms) for at least 48 hours prior to the first CHG shower.  You may shave your face.  Please follow these instructions carefully:   1.  Shower with CHG Soap the night before surgery and  the                    morning of Surgery.  2.  If you choose to wash your hair, wash your hair first as usual with your       normal shampoo.  3.  After you shampoo, rinse your hair and body thoroughly to remove the shampoo.  4.  Use CHG as you would any other liquid soap.  You can apply chg directly       to the skin and wash gently with scrungie or a clean washcloth.  5.  Apply the CHG Soap to your body ONLY FROM THE NECK DOWN.        Do not use on open wounds or open sores.  Avoid contact with your eyes, ears, mouth and genitals (private parts).  Wash genitals (private parts) with your normal soap.  6.  Wash thoroughly, paying special attention to the area where your surgery        will be performed.  7.  Thoroughly rinse your body with warm water from the neck down.  8.  DO NOT shower/wash with your normal soap after using and rinsing off       the CHG Soap.  9.  Pat yourself dry with a clean towel.            10.  Wear  clean pajamas.            11.  Place clean sheets on your bed the night of your first shower and do not        sleep with pets.  Day of Surgery  Do not apply any lotions the morning of surgery.  Please wear clean clothes to the hospital.   Please read over the following fact sheets that you were given. Pain Booklet, Coughing and Deep Breathing and Surgical Site Infection Prevention

## 2016-07-08 ENCOUNTER — Inpatient Hospital Stay (HOSPITAL_COMMUNITY)
Admission: RE | Admit: 2016-07-08 | Discharge: 2016-07-08 | Disposition: A | Discharge status: Home / Self Care | Payer: No Typology Code available for payment source | Source: Ambulatory Visit

## 2016-07-08 NOTE — Progress Notes (Signed)
Pt has not arrived for 8:00 AM appt. Called pt and left message on her voicemail.

## 2016-07-09 ENCOUNTER — Ambulatory Visit: Payer: No Typology Code available for payment source | Admitting: Family Medicine

## 2016-07-09 ENCOUNTER — Ambulatory Visit (HOSPITAL_COMMUNITY)
Admission: EM | Admit: 2016-07-09 | Discharge: 2016-07-09 | Disposition: A | Discharge status: Home / Self Care | Payer: No Typology Code available for payment source | Attending: Family Medicine | Admitting: Family Medicine

## 2016-07-09 ENCOUNTER — Encounter (HOSPITAL_COMMUNITY): Payer: Self-pay | Admitting: Emergency Medicine

## 2016-07-09 DIAGNOSIS — B850 Pediculosis due to Pediculus humanus capitis: Secondary | ICD-10-CM

## 2016-07-09 MED ORDER — LINDANE 1 % EX SHAM: 1.0000 "application " | MEDICATED_SHAMPOO | Freq: Once | CUTANEOUS | 1 refills | Status: AC

## 2016-07-09 NOTE — ED Triage Notes (Signed)
Recently treated for scabies, but has noticed insect bites itching around hairline.

## 2016-07-09 NOTE — Progress Notes (Signed)
I spoke with Toniann FailWendy, triage nurse at [facility] and informed her that Ms Mia CreekLocklear was been seen at Urgent care today for Lice. Toniann FailWendy said she will inform Dr Harlon Florseui.

## 2016-07-09 NOTE — ED Provider Notes (Signed)
[facility]    CSN: [id number] Arrival date & time: 07/09/16  1125     History   Chief Complaint Chief Complaint  Patient presents with  . Insect Bite    HPI Ariana Jensen is a 50 y.o. female.   The history is provided by the patient.  Rash  Location:  Head/neck Head/neck rash location:  Scalp Quality: dryness and itchiness   Severity:  Mild Onset quality:  Gradual Duration:  1 week Progression:  Unchanged Chronicity:  New Context: insect bite/sting   Relieved by:  Antibiotic cream Worsened by:  Nothing Ineffective treatments:  None tried Associated symptoms: no fever     Past Medical History:  Diagnosis Date  . ASCUS (atypical squamous cells of undetermined significance) on Pap smear   . Cocaine abuse    crack cocaine  . GERD (gastroesophageal reflux disease)   . Hepatitis    Hep C  . Hepatitis C antibody test positive   . Hypertension   . Opioid dependence (HCC)   . Trichomonas 04/17/2011   Diagnosed 04/02/11 during hospitalization, treated with Flagyl 2g, patient instructed to have partner treated (must follow-up)   . Tuberculosis    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 age 69, now s/p 1-yr of multidrug treatment (dates unknown)    Patient Active Problem List   Diagnosis Date Noted  . Nausea 10/04/2015  . Pruritic dermatitis 09/10/2015  . Rash and nonspecific skin eruption 08/21/2015  . Body lice 08/05/2015  . Prolonged QT interval 07/18/2015  . Dysmenorrhea 04/10/2015  . Trichimoniasis 04/10/2015  . Chest pain 03/07/2015  . Healthcare maintenance 03/07/2015  . RLQ abdominal pain 02/18/2012  . GERD (gastroesophageal reflux disease) 01/28/2012  . Menorrhagia 01/25/2012  . Weakness generalized 12/05/2011  . Tobacco abuse 11/06/2011  . HTN (hypertension) 10/22/2011  . Abscess and cellulitis 10/22/2011  . Hepatitis C, chronic (HCC) 10/22/2011  . History of TB (tuberculosis) 10/22/2011  . Opioid dependence (HCC)   . Polysubstance  abuse 04/17/2011    Past Surgical History:  Procedure Laterality Date  . CESAREAN SECTION  1984  . DILATION AND CURETTAGE OF UTERUS    . I&D EXTREMITY  10/18/2011   Procedure: IRRIGATION AND DEBRIDEMENT EXTREMITY;  Surgeon: Sharma Covert, MD;  Location: [facility] OR;  Service: Orthopedics;  Laterality: Left;  . I&D EXTREMITY  10/21/2011   Procedure: IRRIGATION AND DEBRIDEMENT EXTREMITY;  Surgeon: Sharma Covert, MD;  Location: [facility] OR;  Service: Orthopedics;  Laterality: Left;  . SALPINGECTOMY Left March 2010   ectopic pregnancy    OB History    No data available       Home Medications    Prior to Admission medications   Medication Sig Start Date End Date Taking? Authorizing Provider  hydrochlorothiazide (HYDRODIURIL) 25 MG tablet Take 1 tablet (25 mg total) by mouth daily. 04/02/16   Ardith Dark, MD  hydrOXYzine (ATARAX/VISTARIL) 10 MG tablet Take 10 mg by mouth 3 (three) times daily as needed (for mild itching.).    Historical Provider, MD  hydrOXYzine (ATARAX/VISTARIL) 25 MG tablet Take 1 tablet (25 mg total) by mouth every 8 (eight) hours as needed for itching. Patient taking differently: Take 25 mg by mouth every 8 (eight) hours as needed for itching (for severe itching.).  02/01/16   Alvira Monday, MD  ibuprofen (ADVIL,MOTRIN) 200 MG tablet Take 400-600 mg by mouth every 6 (six) hours as needed (for pain.).    Historical Provider, MD  lisinopril (PRINIVIL,ZESTRIL) 20 MG tablet Take  1 tablet (20 mg total) by mouth daily. 04/02/16   Ardith Darkaleb M Parker, MD  methadone (DOLOPHINE) 5 MG/5ML solution Take 80 mg by mouth every morning.     Historical Provider, MD  polyethylene glycol powder (GLYCOLAX/MIRALAX) powder Take 34 g by mouth 2 (two) times daily as needed. Take until you have a bowel movement. Patient taking differently: Take 17-34 g by mouth 2 (two) times daily as needed (for constipation.).  11/12/15   Yolande Jollyaleb G Melancon, MD  predniSONE (DELTASONE) 50 MG tablet Take 1 tablet (50 mg total)  by mouth daily. Patient not taking: Reported on 07/06/2016 06/22/16   Eustace MooreLaura W Murray, MD  ranitidine (ZANTAC) 150 MG tablet Take 150 mg by mouth daily.    Historical Provider, MD  tizanidine (ZANAFLEX) 2 MG capsule Take 1 capsule (2 mg total) by mouth 3 (three) times daily as needed for muscle spasms. Patient not taking: Reported on 07/06/2016 03/03/16   Nani RavensAndrew M Wight, MD  triamcinolone cream (KENALOG) 0.1 % Apply 1 application topically 2 (two) times daily. Patient taking differently: Apply 1 application topically 2 (two) times daily as needed (for itching.).  11/17/15   Dub MikesSamantha Tripp Dowless, PA-C    Family History Family History  Problem Relation Age of Onset  . Heart attack Mother   . Heart attack Father   . Heart attack Paternal Grandmother   . Heart attack Paternal Grandfather   . Cirrhosis Brother     Social History Social History  Substance Use Topics  . Smoking status: Current Every Day Smoker    Packs/day: 1.00    Years: 20.00  . Smokeless tobacco: Never Used  . Alcohol use 0.0 oz/week     Comment: social     Allergies   Review of patient's allergies indicates no known allergies.   Review of Systems Review of Systems  Constitutional: Negative for fever.  Skin: Positive for rash.  All other systems reviewed and are negative.    Physical Exam Triage Vital Signs ED Triage Vitals [07/09/16 1241]  Enc Vitals Group     BP (!) 148/105     Pulse Rate 94     Resp 16     Temp 98.2 F (36.8 C)     Temp Source Oral     SpO2 98 %     Weight      Height      Head Circumference      Peak Flow      Pain Score      Pain Loc      Pain Edu?      Excl. in GC?    No data found.   Updated Vital Signs BP (!) 148/105   Pulse 94   Temp 98.2 F (36.8 C) (Oral)   Resp 16   LMP 10/23/2015   SpO2 98%   Visual Acuity Right Eye Distance:   Left Eye Distance:   Bilateral Distance:    Right Eye Near:   Left Eye Near:    Bilateral Near:     Physical Exam    Constitutional: She is oriented to person, place, and time. She appears well-developed and well-nourished.  Neurological: She is alert and oriented to person, place, and time.  Skin: Skin is dry. Rash noted.  Nursing note and vitals reviewed.    UC Treatments / Results  Labs (all labs ordered are listed, but only abnormal results are displayed) Labs Reviewed - No data to display  EKG  EKG Interpretation None  Radiology No results found.  Procedures Procedures (including critical care time)  Medications Ordered in UC Medications - No data to display   Initial Impression / Assessment and Plan / UC Course  I have reviewed the triage vital signs and the nursing notes.  Pertinent labs & imaging results that were available during my care of the patient were reviewed by me and considered in my medical decision making (see chart for details).  Clinical Course      Final Clinical Impressions(s) / UC Diagnoses   Final diagnoses:  None    New Prescriptions New Prescriptions   No medications on file     Linna Hoff, MD 07/09/16 1325

## 2016-07-14 ENCOUNTER — Encounter (HOSPITAL_COMMUNITY): Admission: RE | Payer: Self-pay | Source: Ambulatory Visit

## 2016-07-14 ENCOUNTER — Ambulatory Visit (HOSPITAL_COMMUNITY)
Admission: RE | Admit: 2016-07-14 | Payer: No Typology Code available for payment source | Source: Ambulatory Visit | Admitting: Surgery

## 2016-07-14 SURGERY — REPAIR, HERNIA, UMBILICAL, ADULT
Anesthesia: General

## 2016-07-20 ENCOUNTER — Ambulatory Visit: Payer: No Typology Code available for payment source | Admitting: Family Medicine

## 2016-07-23 ENCOUNTER — Ambulatory Visit: Payer: No Typology Code available for payment source | Admitting: Family Medicine

## 2016-07-27 ENCOUNTER — Encounter: Payer: Self-pay | Admitting: Family Medicine

## 2016-07-27 ENCOUNTER — Ambulatory Visit (INDEPENDENT_AMBULATORY_CARE_PROVIDER_SITE_OTHER): Payer: Self-pay | Admitting: Family Medicine

## 2016-07-27 ENCOUNTER — Emergency Department (HOSPITAL_COMMUNITY)
Admission: EM | Admit: 2016-07-27 | Discharge: 2016-07-27 | Disposition: A | Discharge status: Home / Self Care | Payer: No Typology Code available for payment source | Attending: Emergency Medicine | Admitting: Emergency Medicine

## 2016-07-27 ENCOUNTER — Encounter (HOSPITAL_COMMUNITY): Payer: Self-pay | Admitting: Emergency Medicine

## 2016-07-27 VITALS — BP 160/112 | HR 85 | Ht 63.0 in | Wt 146.0 lb

## 2016-07-27 DIAGNOSIS — I10 Essential (primary) hypertension: Secondary | ICD-10-CM | POA: Insufficient documentation

## 2016-07-27 DIAGNOSIS — X58XXXA Exposure to other specified factors, initial encounter: Secondary | ICD-10-CM | POA: Insufficient documentation

## 2016-07-27 DIAGNOSIS — L299 Pruritus, unspecified: Secondary | ICD-10-CM

## 2016-07-27 DIAGNOSIS — Y999 Unspecified external cause status: Secondary | ICD-10-CM | POA: Insufficient documentation

## 2016-07-27 DIAGNOSIS — F17200 Nicotine dependence, unspecified, uncomplicated: Secondary | ICD-10-CM | POA: Insufficient documentation

## 2016-07-27 DIAGNOSIS — F1910 Other psychoactive substance abuse, uncomplicated: Secondary | ICD-10-CM

## 2016-07-27 DIAGNOSIS — Z79899 Other long term (current) drug therapy: Secondary | ICD-10-CM | POA: Insufficient documentation

## 2016-07-27 DIAGNOSIS — Y939 Activity, unspecified: Secondary | ICD-10-CM | POA: Insufficient documentation

## 2016-07-27 DIAGNOSIS — Y929 Unspecified place or not applicable: Secondary | ICD-10-CM | POA: Insufficient documentation

## 2016-07-27 DIAGNOSIS — S46812A Strain of other muscles, fascia and tendons at shoulder and upper arm level, left arm, initial encounter: Secondary | ICD-10-CM | POA: Insufficient documentation

## 2016-07-27 MED ORDER — CETIRIZINE HCL 10 MG PO TABS: 10.0000 mg | ORAL_TABLET | Freq: Every day | ORAL | 11 refills | Status: DC

## 2016-07-27 MED ORDER — HYDROXYZINE HCL 25 MG PO TABS: 25.0000 mg | ORAL_TABLET | Freq: Three times a day (TID) | ORAL | 2 refills | Status: DC | PRN

## 2016-07-27 MED ORDER — METHOCARBAMOL 500 MG PO TABS: 500.0000 mg | ORAL_TABLET | Freq: Two times a day (BID) | ORAL | 0 refills | Status: DC

## 2016-07-27 MED ORDER — AMLODIPINE BESYLATE 10 MG PO TABS: 10.0000 mg | ORAL_TABLET | Freq: Every day | ORAL | 3 refills | Status: DC

## 2016-07-27 MED ORDER — CAMPHOR-MENTHOL 0.5-0.5 % EX LOTN: 1.0000 "application " | TOPICAL_LOTION | CUTANEOUS | 0 refills | Status: DC | PRN

## 2016-07-27 MED ORDER — PREDNISONE 50 MG PO TABS: 50.0000 mg | ORAL_TABLET | Freq: Every day | ORAL | 0 refills | Status: DC

## 2016-07-27 NOTE — Discharge Instructions (Signed)
Heating pad, ice, ibuprofen or tylenol for pain. Robaxin for spasms. Follow up with primary care doctor.

## 2016-07-27 NOTE — ED Provider Notes (Signed)
[facility] Provider Note   CSN: [id number] Arrival date & time: 07/27/16  0759     History   Chief Complaint Chief Complaint  Patient presents with  . Insect Bite    HPI Ariana Jensen is a 50 y.o. female.  HPI Ariana Jensen is a 50 y.o. female with hx of hepatitis, htn, tb, presents to ED with complaint of possible "spider bite" to the left shoulder. Pt states she woke up this morning with severe pain to the left shoulder radiating into the neck. Pt states she has a skin Condition with multiple scabbed lesions to her body, for which she sees dermatology. Patient has an appointment with dermatologist this afternoon at 2:30. Patient states that she has not injured herself. She states she is not sure what happened but states she feels like maybe she has a spider bite. No fever or chills. No nausea or vomiting. She has tried a heating pad and ibuprofen which did not help.  Past Medical History:  Diagnosis Date  . ASCUS (atypical squamous cells of undetermined significance) on Pap smear   . Cocaine abuse    crack cocaine  . GERD (gastroesophageal reflux disease)   . Hepatitis    Hep C  . Hepatitis C antibody test positive   . Hypertension   . Opioid dependence (HCC)   . Trichomonas 04/17/2011   Diagnosed 04/02/11 during hospitalization, treated with Flagyl 2g, patient instructed to have partner treated (must follow-up)   . Tuberculosis    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 age 50, now s/p 1-yr of multidrug treatment (dates unknown)    Patient Active Problem List   Diagnosis Date Noted  . Nausea 10/04/2015  . Pruritic dermatitis 09/10/2015  . Rash and nonspecific skin eruption 08/21/2015  . Body lice 08/05/2015  . Prolonged QT interval 07/18/2015  . Dysmenorrhea 04/10/2015  . Trichimoniasis 04/10/2015  . Chest pain 03/07/2015  . Healthcare maintenance 03/07/2015  . RLQ abdominal pain 02/18/2012  . GERD (gastroesophageal reflux disease) 01/28/2012  .  Menorrhagia 01/25/2012  . Weakness generalized 12/05/2011  . Tobacco abuse 11/06/2011  . HTN (hypertension) 10/22/2011  . Abscess and cellulitis 10/22/2011  . Hepatitis C, chronic (HCC) 10/22/2011  . History of TB (tuberculosis) 10/22/2011  . Opioid dependence (HCC)   . Polysubstance abuse 04/17/2011    Past Surgical History:  Procedure Laterality Date  . CESAREAN SECTION  1984  . DILATION AND CURETTAGE OF UTERUS    . I&D EXTREMITY  10/18/2011   Procedure: IRRIGATION AND DEBRIDEMENT EXTREMITY;  Surgeon: Sharma CovertFred W Ortmann, MD;  Location: [facility] OR;  Service: Orthopedics;  Laterality: Left;  . I&D EXTREMITY  10/21/2011   Procedure: IRRIGATION AND DEBRIDEMENT EXTREMITY;  Surgeon: Sharma CovertFred W Ortmann, MD;  Location: [facility] OR;  Service: Orthopedics;  Laterality: Left;  . SALPINGECTOMY Left March 2010   ectopic pregnancy    OB History    No data available       Home Medications    Prior to Admission medications   Medication Sig Start Date End Date Taking? Authorizing Provider  hydrochlorothiazide (HYDRODIURIL) 25 MG tablet Take 1 tablet (25 mg total) by mouth daily. 04/02/16   Ardith Darkaleb M Parker, MD  hydrOXYzine (ATARAX/VISTARIL) 10 MG tablet Take 10 mg by mouth 3 (three) times daily as needed (for mild itching.).    Historical Provider, MD  hydrOXYzine (ATARAX/VISTARIL) 25 MG tablet Take 1 tablet (25 mg total) by mouth every 8 (eight) hours as needed for itching. Patient taking differently: Take 25  mg by mouth every 8 (eight) hours as needed for itching (for severe itching.).  02/01/16   Alvira MondayErin Schlossman, MD  ibuprofen (ADVIL,MOTRIN) 200 MG tablet Take 400-600 mg by mouth every 6 (six) hours as needed (for pain.).    Historical Provider, MD  lisinopril (PRINIVIL,ZESTRIL) 20 MG tablet Take 1 tablet (20 mg total) by mouth daily. 04/02/16   Ardith Darkaleb M Parker, MD  methadone (DOLOPHINE) 5 MG/5ML solution Take 80 mg by mouth every morning.     Historical Provider, MD  polyethylene glycol powder (GLYCOLAX/MIRALAX)  powder Take 34 g by mouth 2 (two) times daily as needed. Take until you have a bowel movement. Patient taking differently: Take 17-34 g by mouth 2 (two) times daily as needed (for constipation.).  11/12/15   Yolande Jollyaleb G Melancon, MD  predniSONE (DELTASONE) 50 MG tablet Take 1 tablet (50 mg total) by mouth daily. Patient not taking: Reported on 07/06/2016 06/22/16   Eustace MooreLaura W Murray, MD  ranitidine (ZANTAC) 150 MG tablet Take 150 mg by mouth daily.    Historical Provider, MD  tizanidine (ZANAFLEX) 2 MG capsule Take 1 capsule (2 mg total) by mouth 3 (three) times daily as needed for muscle spasms. Patient not taking: Reported on 07/06/2016 03/03/16   Nani RavensAndrew M Wight, MD  triamcinolone cream (KENALOG) 0.1 % Apply 1 application topically 2 (two) times daily. Patient taking differently: Apply 1 application topically 2 (two) times daily as needed (for itching.).  11/17/15   Dub MikesSamantha Tripp Dowless, PA-C    Family History Family History  Problem Relation Age of Onset  . Heart attack Mother   . Heart attack Father   . Heart attack Paternal Grandmother   . Heart attack Paternal Grandfather   . Cirrhosis Brother     Social History Social History  Substance Use Topics  . Smoking status: Current Every Day Smoker    Packs/day: 1.00    Years: 20.00  . Smokeless tobacco: Never Used  . Alcohol use 0.0 oz/week     Comment: social     Allergies   Patient has no known allergies.   Review of Systems Review of Systems  Constitutional: Negative for chills and fever.  Respiratory: Negative for cough, chest tightness and shortness of breath.   Cardiovascular: Negative for chest pain, palpitations and leg swelling.  Gastrointestinal: Negative for abdominal pain, diarrhea, nausea and vomiting.  Musculoskeletal: Positive for arthralgias and neck pain. Negative for myalgias and neck stiffness.  Skin: Positive for wound. Negative for rash.  Neurological: Negative for dizziness, weakness and headaches.  All  other systems reviewed and are negative.    Physical Exam Updated Vital Signs BP 157/90 (BP Location: Left Arm)   Pulse 89   Temp 98.1 F (36.7 C) (Oral)   Resp 16   LMP 10/23/2015   SpO2 100%   Physical Exam  Constitutional: She appears well-developed and well-nourished. No distress.  HENT:  Head: Normocephalic.  Eyes: Conjunctivae are normal.  Neck: Neck supple.  Cardiovascular: Normal rate, regular rhythm and normal heart sounds.   Pulmonary/Chest: Effort normal and breath sounds normal. No respiratory distress. She has no wheezes. She has no rales.  Musculoskeletal: She exhibits no edema.  Tenderness to palpation over left trapezius extending from the left shoulder into the neck and down to the scapula. No irregular tenderness. No tenderness in the shoulder or before meals joint. Full range of motion of the neck with no meningismus. No obvious swelling, erythema. Pt with diffuse scabbed and excoriated lesions to  the upper back, arms, legs, with no signs of cellulitis.   Neurological: She is alert.  Skin: Skin is warm and dry.  Psychiatric: She has a normal mood and affect. Her behavior is normal.  Nursing note and vitals reviewed.    ED Treatments / Results  Labs (all labs ordered are listed, but only abnormal results are displayed) Labs Reviewed - No data to display  EKG  EKG Interpretation None       Radiology No results found.  Procedures Procedures (including critical care time)  Medications Ordered in ED Medications - No data to display   Initial Impression / Assessment and Plan / ED Course  I have reviewed the triage vital signs and the nursing notes.  Pertinent labs & imaging results that were available during my care of the patient were reviewed by me and considered in my medical decision making (see chart for details).  Clinical Course     Patient with pain to the left trapezius, she will use she was bitten by a spider. I do not see any  lesions, no erythema, no tissue induration or swelling. I did use bedside ultrasound to see if there is may be a deeper infection/abscess. I did not see anything abnormal on the bedside ultrasound. Most likely trapezius spasms or strain. Will add a muscle relaxant. Patient has a follow-up with dermatology this afternoon, instructed to mention that to them.   Vitals:   07/27/16 0807  BP: 157/90  Pulse: 89  Resp: 16  Temp: 98.1 F (36.7 C)  TempSrc: Oral  SpO2: 100%    Final Clinical Impressions(s) / ED Diagnoses   Final diagnoses:  Trapezius strain, left, initial encounter    New Prescriptions New Prescriptions   No medications on file     Jaynie Crumble, PA-C 07/27/16 1608    Laurence Spates, MD 07/27/16 1650

## 2016-07-27 NOTE — Progress Notes (Signed)
    Subjective:  Ariana Jensen is a 50 y.o. female who [facility] with a chief complaint of pruritic dermatitis.   HPI:  Pruritic Dermatitis Patient with a chronic history of severe pruritis. She has been evaluated several times in the past with the most likely explanation of it being primarily psychogenic and related to her cocaine abuse. She has been empirically treated with permethrin several times in the past without significant relief. She was recently given a course of prednisone by the ED which seemed to help her symptoms. Hydroxyzine also seems to help her symptoms. She has not noticed anything that otherwise makes the symptoms better or worse. No fevers or chills. Patient is concerned because she has several scars all over her body from picking and scratching. Patient says that she is having a hernia repair and that she needs to be cleared by a dermatologist before she can have the surgery.   Hypertension BP Readings from Last 3 Encounters:  07/27/16 (!) 160/112  07/27/16 157/90  07/09/16 (!) 148/105   Home BP monitoring-Yes Compliant with medications-yes, without side effects ROS-Denies any CP, HA, SOB, blurry vision, LE edema, transient weakness, orthopnea, PND.   ROS: Per HPI  PMH: Smoking history reviewed.    Objective:  Physical Exam: BP (!) 160/112   Pulse 85   Ht 5\' 3"  (1.6 m)   Wt 146 lb (66.2 kg)   LMP 10/23/2015   BMI 25.86 kg/m   Gen: NAD, resting comfortably CV: RRR with no murmurs appreciated Pulm: NWOB, CTAB with no crackles, wheezes, or rhonchi MSK: no edema, cyanosis, or clubbing noted Skin: Several excoriated lesions across upper extremities, upper back, and lower extremities. No other areas of erythema noted. No drainage or tenderness noted at excoriated lesions. Various stages of healing. No lesions on hands or feet. No lesions on lower back.  Neuro: grossly normal, moves all extremities Psych: Pressured speech, anxious appearing.    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 Patient's methadone may also be playing a role.   Will treat symptomatically with zyrtec, hydroxyzine, and sarna lotion. Will also give 5 day course of prednisone as this as seemed to help in the past. Referral to dermatology placed per patient request. Can consider punch biopsy at future visit to rule out any other organic etiologies.   HTN (hypertension) Significantly elevated to 209/115 today initially. Improved to 160/112 with clonidine. Patient with several consecutive readings above goal. Will start amlodipine 10mg  daily today. Follow up later this week with nurse clinic for BP check.   Polysubstance abuse Check UDS today.    Katina Degreealeb M. Jimmey RalphParker, MD [facility] Resident PGY-3 07/27/2016 4:00 PM

## 2016-07-27 NOTE — Assessment & Plan Note (Addendum)
Significantly elevated to 209/115 today initially. Improved to 160/112 with clonidine. Patient with several consecutive readings above goal. Will start amlodipine 10mg  daily today. Follow up later this week with nurse clinic for BP check.

## 2016-07-27 NOTE — Assessment & Plan Note (Signed)
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 Patient's methadone may also be playing a role.   Will treat symptomatically with zyrtec, hydroxyzine, and sarna lotion. Will also give 5 day course of prednisone as this as seemed to help in the past. Referral to dermatology placed per patient request. Can consider punch biopsy at future visit to rule out any other organic etiologies.

## 2016-07-27 NOTE — Assessment & Plan Note (Signed)
-   Check UDS today 

## 2016-07-27 NOTE — Patient Instructions (Addendum)
We will send in the referral to dermatology. Please start the zyrtec. Take the prednisone for 5 days. Please us the sarna lotion.  Your blood pressure was high today. We will start amlodipine today. We want your blood pressure to be less than 140/90.   Come back in later this week for a recheck.  Katina Degreealeb M. Jimmey RalphParker, MD [facility] Resident PGY-3 07/27/2016 3:24 PM

## 2016-07-27 NOTE — ED Triage Notes (Signed)
Pt c/o possible insect bite on L shoulder x 2 days ago. Pt sts pain has become unbearable, c/o neck and shoulder stiffness, severe tenderness to palpation. Upon assessment, this RN doesn't see any puncture wounds on L shoulder or notice any skin changes, swelling, etc. Where pt c/o tenderness. Some scabbing noted to middle of upper back but pt sts she is being treated for a separate skin condition. Denies fever, chills, fatigue. A&Ox4 and ambulatory

## 2016-08-01 LAB — PAIN MGMT, PROFILE 5 W/O MEDMATCH U
ALPHAHYDROXYMIDAZOLAM: NEGATIVE ng/mL (ref <50)
ALPHAHYDROXYTRIAZOLAM: NEGATIVE ng/mL (ref <50)
AMPHETAMINES: NEGATIVE ng/mL (ref <500)
Alphahydroxyalprazolam: NEGATIVE ng/mL (ref <25)
Aminoclonazepam: NEGATIVE ng/mL (ref <25)
Barbiturates: NEGATIVE ng/mL (ref <300)
Benzodiazepines: NEGATIVE ng/mL (ref <100)
Benzoylecgonine: 18944 ng/mL — ABNORMAL HIGH (ref <100)
Cocaine Metabolite: POSITIVE ng/mL — AB (ref <150)
Creatinine: 146.4 mg/dL (ref >=20.0)
EDDP: 30679 ng/mL — ABNORMAL HIGH (ref <100)
HYDROXYETHYLFLURAZEPAM: NEGATIVE ng/mL (ref <50)
LORAZEPAM: NEGATIVE ng/mL (ref <50)
METHADONE METABOLITE: POSITIVE ng/mL — AB (ref <100)
Marijuana Metabolite: NEGATIVE ng/mL (ref <20)
Methadone: 7015 ng/mL — ABNORMAL HIGH (ref <100)
NORDIAZEPAM: NEGATIVE ng/mL (ref <50)
OPIATES: NEGATIVE ng/mL (ref <100)
OXYCODONE: NEGATIVE ng/mL (ref <100)
Oxazepam: NEGATIVE ng/mL (ref <50)
Oxidant: NEGATIVE ug/mL (ref <200)
TEMAZEPAM: NEGATIVE ng/mL (ref <50)
pH: 7.67 (ref 4.5–9.0)

## 2016-08-15 ENCOUNTER — Encounter (HOSPITAL_COMMUNITY): Payer: Self-pay | Admitting: Emergency Medicine

## 2016-08-15 ENCOUNTER — Emergency Department (HOSPITAL_COMMUNITY)
Admission: EM | Admit: 2016-08-15 | Discharge: 2016-08-15 | Disposition: A | Discharge status: Home / Self Care | Payer: No Typology Code available for payment source | Attending: Emergency Medicine | Admitting: Emergency Medicine

## 2016-08-15 DIAGNOSIS — L281 Prurigo nodularis: Secondary | ICD-10-CM

## 2016-08-15 DIAGNOSIS — I10 Essential (primary) hypertension: Secondary | ICD-10-CM | POA: Insufficient documentation

## 2016-08-15 DIAGNOSIS — Z79899 Other long term (current) drug therapy: Secondary | ICD-10-CM | POA: Insufficient documentation

## 2016-08-15 DIAGNOSIS — R238 Other skin changes: Secondary | ICD-10-CM | POA: Insufficient documentation

## 2016-08-15 DIAGNOSIS — F17200 Nicotine dependence, unspecified, uncomplicated: Secondary | ICD-10-CM | POA: Insufficient documentation

## 2016-08-15 MED ORDER — TRIAMCINOLONE ACETONIDE 0.1 % EX CREA: 1.0000 "application " | TOPICAL_CREAM | Freq: Two times a day (BID) | CUTANEOUS | 0 refills | Status: DC

## 2016-08-15 MED ORDER — PREDNISONE 20 MG PO TABS: 40.0000 mg | ORAL_TABLET | Freq: Every day | ORAL | 0 refills | Status: DC

## 2016-08-15 NOTE — ED Provider Notes (Signed)
[facility]-EMERGENCY DEPT Provider Note   CSN: [id number] Arrival date & time: 08/15/16  1239     History   Chief Complaint Chief Complaint  Patient presents with  . Anxiety    HPI Ariana Jensen is a 50 y.o. female who presents with a skin problem. PMH significant for anxiety and substance abuse (cocaine and opioids). She has been seen and evaluated multiple times for this problem. She states no one will listen to her or "really look" at her skin. She has been put on steroids in the past which have helped and Atarax which has not helped. She states she continues to pick her skin and found "maggots" in several of the open areas on her skin. Denies fever or drainage. She states she has not seen a dermatologist because she does not have insurance or money to pay. She denies changes in soaps, lotions, detergents. She denies having any bugs in her home.  HPI  Past Medical History:  Diagnosis Date  . ASCUS (atypical squamous cells of undetermined significance) on Pap smear   . Cocaine abuse    crack cocaine  . GERD (gastroesophageal reflux disease)   . Hepatitis    Hep C  . Hepatitis C antibody test positive   . Hypertension   . Opioid dependence (HCC)   . Trichomonas 04/17/2011   Diagnosed 04/02/11 during hospitalization, treated with Flagyl 2g, patient instructed to have partner treated (must follow-up)   . Tuberculosis    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 age 50, now s/p 1-yr of multidrug treatment (dates unknown)    Patient Active Problem List   Diagnosis Date Noted  . Nausea 10/04/2015  . Pruritic dermatitis 09/10/2015  . Rash and nonspecific skin eruption 08/21/2015  . Body lice 08/05/2015  . Prolonged QT interval 07/18/2015  . Dysmenorrhea 04/10/2015  . Trichimoniasis 04/10/2015  . Chest pain 03/07/2015  . Healthcare maintenance 03/07/2015  . RLQ abdominal pain 02/18/2012  . GERD (gastroesophageal reflux disease) 01/28/2012  . Menorrhagia 01/25/2012  . Weakness  generalized 12/05/2011  . Tobacco abuse 11/06/2011  . HTN (hypertension) 10/22/2011  . Abscess and cellulitis 10/22/2011  . Hepatitis C, chronic (HCC) 10/22/2011  . History of TB (tuberculosis) 10/22/2011  . Opioid dependence (HCC)   . Polysubstance abuse 04/17/2011    Past Surgical History:  Procedure Laterality Date  . CESAREAN SECTION  1984  . DILATION AND CURETTAGE OF UTERUS    . I&D EXTREMITY  10/18/2011   Procedure: IRRIGATION AND DEBRIDEMENT EXTREMITY;  Surgeon: Sharma CovertFred W Ortmann, MD;  Location: [facility] OR;  Service: Orthopedics;  Laterality: Left;  . I&D EXTREMITY  10/21/2011   Procedure: IRRIGATION AND DEBRIDEMENT EXTREMITY;  Surgeon: Sharma CovertFred W Ortmann, MD;  Location: [facility] OR;  Service: Orthopedics;  Laterality: Left;  . SALPINGECTOMY Left March 2010   ectopic pregnancy    OB History    No data available       Home Medications    Prior to Admission medications   Medication Sig Start Date End Date Taking? Authorizing Provider  amLODipine (NORVASC) 10 MG tablet Take 1 tablet (10 mg total) by mouth daily. 07/27/16   Ardith Darkaleb M Parker, MD  camphor-menthol [facility]) lotion Apply 1 application topically as needed for itching. 07/27/16   Ardith Darkaleb M Parker, MD  cetirizine (ZYRTEC) 10 MG tablet Take 1 tablet (10 mg total) by mouth daily. 07/27/16   Ardith Darkaleb M Parker, MD  hydrochlorothiazide (HYDRODIURIL) 25 MG tablet Take 1 tablet (25 mg total) by mouth daily. 04/02/16  Ardith Dark, MD  hydrOXYzine (ATARAX/VISTARIL) 10 MG tablet Take 10 mg by mouth 3 (three) times daily as needed (for mild itching.).    Historical Provider, MD  hydrOXYzine (ATARAX/VISTARIL) 25 MG tablet Take 1 tablet (25 mg total) by mouth every 8 (eight) hours as needed for itching (for severe itching.). 07/27/16   Ardith Dark, MD  ibuprofen (ADVIL,MOTRIN) 200 MG tablet Take 400-600 mg by mouth every 6 (six) hours as needed (for pain.).    Historical Provider, MD  lisinopril (PRINIVIL,ZESTRIL) 20 MG tablet Take 1 tablet (20 mg total)  by mouth daily. 04/02/16   Ardith Dark, MD  methadone (DOLOPHINE) 5 MG/5ML solution Take 80 mg by mouth every morning.     Historical Provider, MD  methocarbamol (ROBAXIN) 500 MG tablet Take 1 tablet (500 mg total) by mouth 2 (two) times daily. 07/27/16   Tatyana Kirichenko, PA-C  polyethylene glycol powder (GLYCOLAX/MIRALAX) powder Take 34 g by mouth 2 (two) times daily as needed. Take until you have a bowel movement. Patient taking differently: Take 17-34 g by mouth 2 (two) times daily as needed (for constipation.).  11/12/15   Yolande Jolly, MD  predniSONE (DELTASONE) 20 MG tablet Take 2 tablets (40 mg total) by mouth daily. 08/15/16   Bethel Born, PA-C  ranitidine (ZANTAC) 150 MG tablet Take 150 mg by mouth daily.    Historical Provider, MD  triamcinolone cream (KENALOG) 0.1 % Apply 1 application topically 2 (two) times daily. 08/15/16   Bethel Born, PA-C    Family History Family History  Problem Relation Age of Onset  . Heart attack Mother   . Heart attack Father   . Heart attack Paternal Grandmother   . Heart attack Paternal Grandfather   . Cirrhosis Brother     Social History Social History  Substance Use Topics  . Smoking status: Current Every Day Smoker    Packs/day: 1.00    Years: 20.00  . Smokeless tobacco: Never Used  . Alcohol use 0.0 oz/week     Comment: social     Allergies   Patient has no known allergies.   Review of Systems Review of Systems  Constitutional: Negative for fever.  Skin: Positive for color change and wound.     Physical Exam Updated Vital Signs BP 148/94 (BP Location: Right Arm)   Pulse 72   Temp 98.5 F (36.9 C) (Oral)   Resp 18   LMP 10/23/2015   SpO2 99%   Physical Exam  Constitutional: She is oriented to person, place, and time. She appears well-developed and well-nourished. She appears distressed.  Upset about her skin  HENT:  Head: Normocephalic and atraumatic.  Eyes: Conjunctivae are normal. Pupils are  equal, round, and reactive to light. Right eye exhibits no discharge. Left eye exhibits no discharge. No scleral icterus.  Neck: Normal range of motion.  Cardiovascular: Normal rate.   Pulmonary/Chest: Effort normal. No respiratory distress.  Abdominal: She exhibits no distension.  Neurological: She is alert and oriented to person, place, and time.  Skin: Skin is warm and dry.  Multiple excoriated and scabbed over nodules on the bilateral arms, legs, face, and upper back. She is actively picking and scratching in the room. No signs of secondary infection.  Psychiatric: She has a normal mood and affect. Her behavior is normal.  Nursing note and vitals reviewed.    ED Treatments / Results  Labs (all labs ordered are listed, but only abnormal results are displayed) Labs  Reviewed - No data to display  EKG  EKG Interpretation None       Radiology No results found.  Procedures Procedures (including critical care time)  Medications Ordered in ED Medications - No data to display   Initial Impression / Assessment and Plan / ED Course  I have reviewed the triage vital signs and the nursing notes.  Pertinent labs & imaging results that were available during my care of the patient were reviewed by me and considered in my medical decision making (see chart for details).  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 Patient is afebrile, not tachycardic or tachypneic, and not hypoxic. She is upset and frustrated because her problem isn't being "fixed" however I explained that we do not do emergent biopsies in the ER. I advised she will need a dermatologist if symptoms are worsening but she has to stop picking. Rx given for another course of steroids and steroid cream. Continue Atarax prn. I do not see any evidence of secondary infection or maggots.   Final Clinical Impressions(s) / ED Diagnoses   Final diagnoses:  Picker's papule    New  Prescriptions Discharge Medication List as of 08/15/2016  2:55 PM       Bethel BornKelly Marie Kaspar Albornoz, PA-C 08/15/16 1531    Pricilla LovelessScott Goldston, MD 08/20/16 2356

## 2016-08-15 NOTE — ED Notes (Signed)
Patient is being seen for a chronic skin issues that she wants to be biopsied.  Patient shows signs of anxiety over this issue but is not in any distress.

## 2016-08-15 NOTE — Discharge Instructions (Signed)
Do not pick! Use steroid cream along with pills to help reduce inflammation and itching Continue taking Hydroxyzine to help with itching

## 2016-08-15 NOTE — ED Triage Notes (Signed)
Patient here from home with complaints of SOB and anxiety. 98% on room air. Lungs clear. Smoker.

## 2016-08-15 NOTE — ED Notes (Signed)
Patient is alert and oriented x3.  She was given DC instructions and follow up visit instructions.  Patient gave verbal understanding. She was DC ambulatory under her own power to home.  V/S stable.  He was not showing any signs of distress on DC 

## 2016-08-17 ENCOUNTER — Encounter (HOSPITAL_COMMUNITY): Payer: Self-pay | Admitting: Emergency Medicine

## 2016-08-17 ENCOUNTER — Inpatient Hospital Stay (HOSPITAL_COMMUNITY)
Admission: EM | Admit: 2016-08-17 | Discharge: 2016-08-19 | DRG: 897 | Disposition: A | Discharge status: Home / Self Care | Payer: No Typology Code available for payment source | Attending: Internal Medicine | Admitting: Internal Medicine

## 2016-08-17 ENCOUNTER — Other Ambulatory Visit: Payer: Self-pay

## 2016-08-17 DIAGNOSIS — F1410 Cocaine abuse, uncomplicated: Principal | ICD-10-CM | POA: Diagnosis present

## 2016-08-17 DIAGNOSIS — F17210 Nicotine dependence, cigarettes, uncomplicated: Secondary | ICD-10-CM | POA: Diagnosis present

## 2016-08-17 DIAGNOSIS — R339 Retention of urine, unspecified: Secondary | ICD-10-CM | POA: Diagnosis present

## 2016-08-17 DIAGNOSIS — B86 Scabies: Secondary | ICD-10-CM | POA: Diagnosis present

## 2016-08-17 DIAGNOSIS — I10 Essential (primary) hypertension: Secondary | ICD-10-CM | POA: Diagnosis present

## 2016-08-17 DIAGNOSIS — Z8249 Family history of ischemic heart disease and other diseases of the circulatory system: Secondary | ICD-10-CM

## 2016-08-17 DIAGNOSIS — F1910 Other psychoactive substance abuse, uncomplicated: Secondary | ICD-10-CM | POA: Diagnosis present

## 2016-08-17 DIAGNOSIS — K219 Gastro-esophageal reflux disease without esophagitis: Secondary | ICD-10-CM | POA: Diagnosis present

## 2016-08-17 DIAGNOSIS — F419 Anxiety disorder, unspecified: Secondary | ICD-10-CM | POA: Diagnosis present

## 2016-08-17 DIAGNOSIS — F329 Major depressive disorder, single episode, unspecified: Secondary | ICD-10-CM | POA: Diagnosis present

## 2016-08-17 DIAGNOSIS — F1120 Opioid dependence, uncomplicated: Secondary | ICD-10-CM | POA: Diagnosis present

## 2016-08-17 DIAGNOSIS — M6282 Rhabdomyolysis: Secondary | ICD-10-CM | POA: Diagnosis present

## 2016-08-17 DIAGNOSIS — Z79899 Other long term (current) drug therapy: Secondary | ICD-10-CM

## 2016-08-17 DIAGNOSIS — L0292 Furuncle, unspecified: Secondary | ICD-10-CM | POA: Diagnosis present

## 2016-08-17 DIAGNOSIS — M797 Fibromyalgia: Secondary | ICD-10-CM | POA: Diagnosis present

## 2016-08-17 DIAGNOSIS — I4581 Long QT syndrome: Secondary | ICD-10-CM | POA: Diagnosis present

## 2016-08-17 DIAGNOSIS — N179 Acute kidney failure, unspecified: Secondary | ICD-10-CM | POA: Diagnosis present

## 2016-08-17 DIAGNOSIS — L308 Other specified dermatitis: Secondary | ICD-10-CM | POA: Diagnosis present

## 2016-08-17 DIAGNOSIS — R443 Hallucinations, unspecified: Secondary | ICD-10-CM | POA: Diagnosis present

## 2016-08-17 DIAGNOSIS — Z720 Tobacco use: Secondary | ICD-10-CM | POA: Diagnosis present

## 2016-08-17 DIAGNOSIS — R9431 Abnormal electrocardiogram [ECG] [EKG]: Secondary | ICD-10-CM | POA: Diagnosis present

## 2016-08-17 DIAGNOSIS — R112 Nausea with vomiting, unspecified: Secondary | ICD-10-CM | POA: Diagnosis present

## 2016-08-17 DIAGNOSIS — E860 Dehydration: Secondary | ICD-10-CM | POA: Diagnosis present

## 2016-08-17 HISTORY — DX: Low back pain, unspecified: M54.50

## 2016-08-17 HISTORY — DX: Depression, unspecified: F32.A

## 2016-08-17 HISTORY — DX: Other chronic pain: G89.29

## 2016-08-17 HISTORY — DX: Unspecified convulsions: R56.9

## 2016-08-17 HISTORY — DX: Unspecified viral hepatitis C without hepatic coma: B19.20

## 2016-08-17 HISTORY — DX: Fibromyalgia: M79.7

## 2016-08-17 HISTORY — DX: Major depressive disorder, single episode, unspecified: F32.9

## 2016-08-17 HISTORY — DX: Anxiety disorder, unspecified: F41.9

## 2016-08-17 HISTORY — DX: Low back pain: M54.5

## 2016-08-17 LAB — CBC WITH DIFFERENTIAL/PLATELET
Basophils Absolute: 0 10*3/uL (ref 0.0–0.1)
Basophils Relative: 0 %
EOS PCT: 1 %
Eosinophils Absolute: 0.1 10*3/uL (ref 0.0–0.7)
HEMATOCRIT: 35.5 % — AB (ref 36.0–46.0)
Hemoglobin: 11.6 g/dL — ABNORMAL LOW (ref 12.0–15.0)
LYMPHS ABS: 2 10*3/uL (ref 0.7–4.0)
LYMPHS PCT: 19 %
MCH: 29.1 pg (ref 26.0–34.0)
MCHC: 32.7 g/dL (ref 30.0–36.0)
MCV: 89.2 fL (ref 78.0–100.0)
MONO ABS: 0.7 10*3/uL (ref 0.1–1.0)
MONOS PCT: 6 %
NEUTROS ABS: 7.6 10*3/uL (ref 1.7–7.7)
Neutrophils Relative %: 74 %
PLATELETS: 264 10*3/uL (ref 150–400)
RBC: 3.98 MIL/uL (ref 3.87–5.11)
RDW: 15.4 % (ref 11.5–15.5)
WBC: 10.4 10*3/uL (ref 4.0–10.5)

## 2016-08-17 LAB — COMPREHENSIVE METABOLIC PANEL
ALT: 20 U/L (ref 14–54)
AST: 31 U/L (ref 15–41)
Albumin: 3.7 g/dL (ref 3.5–5.0)
Alkaline Phosphatase: 77 U/L (ref 38–126)
Anion gap: 11 (ref 5–15)
BILIRUBIN TOTAL: 0.7 mg/dL (ref 0.3–1.2)
BUN: 24 mg/dL — ABNORMAL HIGH (ref 6–20)
CHLORIDE: 100 mmol/L — AB (ref 101–111)
CO2: 24 mmol/L (ref 22–32)
CREATININE: 1.38 mg/dL — AB (ref 0.44–1.00)
Calcium: 8.9 mg/dL (ref 8.9–10.3)
GFR, EST AFRICAN AMERICAN: 51 mL/min — AB (ref >60)
GFR, EST NON AFRICAN AMERICAN: 44 mL/min — AB (ref >60)
Glucose, Bld: 79 mg/dL (ref 65–99)
POTASSIUM: 3.5 mmol/L (ref 3.5–5.1)
Sodium: 135 mmol/L (ref 135–145)
TOTAL PROTEIN: 7.3 g/dL (ref 6.5–8.1)

## 2016-08-17 LAB — RAPID URINE DRUG SCREEN, HOSP PERFORMED
Amphetamines: NOT DETECTED
BARBITURATES: NOT DETECTED
BENZODIAZEPINES: NOT DETECTED
COCAINE: POSITIVE — AB
OPIATES: NOT DETECTED
Tetrahydrocannabinol: NOT DETECTED

## 2016-08-17 LAB — URINE MICROSCOPIC-ADD ON: RBC / HPF: NONE SEEN RBC/hpf (ref 0–5)

## 2016-08-17 LAB — CK: CK TOTAL: 949 U/L — AB (ref 38–234)

## 2016-08-17 LAB — RAPID HIV SCREEN (HIV 1/2 AB+AG)
HIV 1/2 Antibodies: NONREACTIVE
HIV-1 P24 Antigen - HIV24: NONREACTIVE

## 2016-08-17 LAB — URINALYSIS, ROUTINE W REFLEX MICROSCOPIC
Bilirubin Urine: NEGATIVE
GLUCOSE, UA: NEGATIVE mg/dL
HGB URINE DIPSTICK: NEGATIVE
Ketones, ur: NEGATIVE mg/dL
Nitrite: NEGATIVE
PH: 5.5 (ref 5.0–8.0)
Protein, ur: NEGATIVE mg/dL
SPECIFIC GRAVITY, URINE: 1.015 (ref 1.005–1.030)

## 2016-08-17 LAB — ETHANOL

## 2016-08-17 MED ORDER — SODIUM CHLORIDE 0.9 % IV BOLUS (SEPSIS)
2000.0000 mL | Freq: Once | INTRAVENOUS | Status: AC
  Administered 2016-08-17: 2000 mL via INTRAVENOUS

## 2016-08-17 NOTE — ED Notes (Signed)
[place] [employer] [employer] deputy at bedside serving [employer] paperwork; pt calm and cooperated and verbalizes understanding of same

## 2016-08-17 NOTE — ED Provider Notes (Signed)
[facility]-EMERGENCY DEPT Provider Note   CSN: [id number] Arrival date & time: 08/17/16  1900  Level V caveat  patient hallucinating   History   Chief Complaint Chief Complaint  Patient presents with  . Hallucinations  . Abscess    HPI Ariana Jensen is a 50 y.o. female.Patient has brought a bottle of dishwashing liquid with her stating that there are objects living in the dishwashing liquid which are invading her body and trying to kill her. She states has been going on for several years. Patient reports that she is seeing a psychiatrist some years ago. She also complains of sores diffusely over her body for the past 2 years. Unchanged. She admits to using cocaine several days ago. Denies any IV drug use.  HPI  Past Medical History:  Diagnosis Date  . ASCUS (atypical squamous cells of undetermined significance) on Pap smear   . Cocaine abuse    crack cocaine  . GERD (gastroesophageal reflux disease)   . Hepatitis    Hep C  . Hepatitis C antibody test positive   . Hypertension   . Opioid dependence (HCC)   . Trichomonas 04/17/2011   Diagnosed 04/02/11 during hospitalization, treated with Flagyl 2g, patient instructed to have partner treated (must follow-up)   . Tuberculosis    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 age 65, now s/p 1-yr of multidrug treatment (dates unknown)    Patient Active Problem List   Diagnosis Date Noted  . Nausea 10/04/2015  . Pruritic dermatitis 09/10/2015  . Rash and nonspecific skin eruption 08/21/2015  . Body lice 08/05/2015  . Prolonged QT interval 07/18/2015  . Dysmenorrhea 04/10/2015  . Trichimoniasis 04/10/2015  . Chest pain 03/07/2015  . Healthcare maintenance 03/07/2015  . RLQ abdominal pain 02/18/2012  . GERD (gastroesophageal reflux disease) 01/28/2012  . Menorrhagia 01/25/2012  . Weakness generalized 12/05/2011  . Tobacco abuse 11/06/2011  . HTN (hypertension) 10/22/2011  . Abscess and cellulitis 10/22/2011  . Hepatitis C,  chronic (HCC) 10/22/2011  . History of TB (tuberculosis) 10/22/2011  . Opioid dependence (HCC)   . Polysubstance abuse 04/17/2011    Past Surgical History:  Procedure Laterality Date  . CESAREAN SECTION  1984  . DILATION AND CURETTAGE OF UTERUS    . I&D EXTREMITY  10/18/2011   Procedure: IRRIGATION AND DEBRIDEMENT EXTREMITY;  Surgeon: Sharma Covert, MD;  Location: [facility] OR;  Service: Orthopedics;  Laterality: Left;  . I&D EXTREMITY  10/21/2011   Procedure: IRRIGATION AND DEBRIDEMENT EXTREMITY;  Surgeon: Sharma Covert, MD;  Location: [facility] OR;  Service: Orthopedics;  Laterality: Left;  . SALPINGECTOMY Left March 2010   ectopic pregnancy    OB History    No data available       Home Medications    Prior to Admission medications   Medication Sig Start Date End Date Taking? Authorizing Provider  amLODipine (NORVASC) 10 MG tablet Take 1 tablet (10 mg total) by mouth daily. 07/27/16  Yes Ardith Dark, MD  camphor-menthol [facility]) lotion Apply 1 application topically as needed for itching. 07/27/16  Yes Ardith Dark, MD  cetirizine (ZYRTEC) 10 MG tablet Take 1 tablet (10 mg total) by mouth daily. Patient taking differently: Take 10 mg by mouth daily as needed for allergies.  07/27/16  Yes Ardith Dark, MD  cloNIDine (CATAPRES) 0.1 MG tablet Take 0.1 mg by mouth at bedtime.   Yes Historical Provider, MD  cyclobenzaprine (FLEXERIL) 10 MG tablet Take 10 mg by mouth daily.   Yes  Historical Provider, MD  ferrous sulfate 325 (65 FE) MG tablet Take 325 mg by mouth daily with breakfast.   Yes Historical Provider, MD  furosemide (LASIX) 20 MG tablet Take 20 mg by mouth daily.   Yes Historical Provider, MD  gabapentin (NEURONTIN) 300 MG capsule Take 300 mg by mouth 2 (two) times daily.   Yes Historical Provider, MD  hydrochlorothiazide (HYDRODIURIL) 25 MG tablet Take 1 tablet (25 mg total) by mouth daily. 04/02/16  Yes Ardith Darkaleb M Parker, MD  hydrOXYzine (ATARAX/VISTARIL) 10 MG tablet Take 10 mg by mouth  3 (three) times daily as needed (for mild itching.).   Yes Historical Provider, MD  hydrOXYzine (ATARAX/VISTARIL) 25 MG tablet Take 1 tablet (25 mg total) by mouth every 8 (eight) hours as needed for itching (for severe itching.). Patient taking differently: Take 25 mg by mouth 4 (four) times daily.  07/27/16  Yes Ardith Darkaleb M Parker, MD  ibuprofen (ADVIL,MOTRIN) 200 MG tablet Take 400-600 mg by mouth every 6 (six) hours as needed (for pain.).   Yes Historical Provider, MD  lisinopril (PRINIVIL,ZESTRIL) 20 MG tablet Take 1 tablet (20 mg total) by mouth daily. 04/02/16  Yes Ardith Darkaleb M Parker, MD  methadone (DOLOPHINE) 5 MG/5ML solution Take 80 mg by mouth every morning.    Yes Historical Provider, MD  methocarbamol (ROBAXIN) 500 MG tablet Take 1 tablet (500 mg total) by mouth 2 (two) times daily. Patient taking differently: Take 500 mg by mouth 2 (two) times daily as needed for muscle spasms.  07/27/16  Yes Tatyana Kirichenko, PA-C  montelukast (SINGULAIR) 10 MG tablet Take 10 mg by mouth at bedtime.   Yes Historical Provider, MD  polyethylene glycol powder (GLYCOLAX/MIRALAX) powder Take 34 g by mouth 2 (two) times daily as needed. Take until you have a bowel movement. Patient taking differently: Take 17-34 g by mouth 2 (two) times daily as needed (for constipation.).  11/12/15  Yes Yolande Jollyaleb G Melancon, MD  ranitidine (ZANTAC) 150 MG tablet Take 150 mg by mouth daily.   Yes Historical Provider, MD  predniSONE (DELTASONE) 20 MG tablet Take 2 tablets (40 mg total) by mouth daily. Patient not taking: Reported on 08/17/2016 08/15/16   Bethel BornKelly Marie Gekas, PA-C  triamcinolone cream (KENALOG) 0.1 % Apply 1 application topically 2 (two) times daily. Patient not taking: Reported on 08/17/2016 08/15/16   Bethel BornKelly Marie Gekas, PA-C    Family History Family History  Problem Relation Age of Onset  . Heart attack Mother   . Heart attack Father   . Heart attack Paternal Grandmother   . Heart attack Paternal Grandfather   .  Cirrhosis Brother     Social History Social History  Substance Use Topics  . Smoking status: Current Every Day Smoker    Packs/day: 1.00    Years: 20.00  . Smokeless tobacco: Never Used  . Alcohol use 0.0 oz/week     Comment: social     Allergies   Patient has no known allergies.   Review of Systems Review of Systems  Unable to perform ROS: Psychiatric disorder  Skin: Positive for rash.     Physical Exam Updated Vital Signs BP 122/80   Pulse 105   Temp 98 F (36.7 C) (Oral)   Resp 17   Ht 5\' 3"  (1.6 m)   Wt 146 lb (66.2 kg)   LMP 10/23/2015   SpO2 (!) 85%   BMI 25.86 kg/m   Physical Exam  Constitutional: She is oriented to person, place, and time.  She appears distressed.  Unkempt. Anxious appearing  HENT:  Head: Normocephalic and atraumatic.  No mucosal lesion  Eyes: Conjunctivae are normal. Pupils are equal, round, and reactive to light.  Neck: Neck supple. No tracheal deviation present. No thyromegaly present.  Cardiovascular: Regular rhythm.   No murmur heard. Mildly tachycardic  Pulmonary/Chest: Effort normal and breath sounds normal.  Abdominal: Soft. Bowel sounds are normal. She exhibits no distension. There is no tenderness.  Musculoskeletal: Normal range of motion. She exhibits no edema or tenderness.  Neurological: She is alert and oriented to person, place, and time. Coordination normal.  Skin: Skin is warm and dry. Rash noted.  Tiny scabbed lesions on extremities and upper back. Not involving palms or soles not involving trunk other than upper back  Psychiatric: She has a normal mood and affect.  Nursing note and vitals reviewed.    ED Treatments / Results  Labs (all labs ordered are listed, but only abnormal results are displayed) Labs Reviewed  RAPID URINE DRUG SCREEN, HOSP PERFORMED - Abnormal; Notable for the following:       Result Value   Cocaine POSITIVE (*)    All other components within normal limits  URINALYSIS, ROUTINE W  REFLEX MICROSCOPIC (NOT AT [facility]) - Abnormal; Notable for the following:    Color, Urine STRAW (*)    APPearance CLOUDY (*)    Leukocytes, UA TRACE (*)    All other components within normal limits  CBC WITH DIFFERENTIAL/PLATELET - Abnormal; Notable for the following:    Hemoglobin 11.6 (*)    HCT 35.5 (*)    All other components within normal limits  COMPREHENSIVE METABOLIC PANEL - Abnormal; Notable for the following:    Chloride 100 (*)    BUN 24 (*)    Creatinine, Ser 1.38 (*)    GFR calc non Af Amer 44 (*)    GFR calc Af Amer 51 (*)    All other components within normal limits  URINE MICROSCOPIC-ADD ON - Abnormal; Notable for the following:    Squamous Epithelial / LPF 6-30 (*)    Bacteria, UA MANY (*)    Casts HYALINE CASTS (*)    All other components within normal limits  ETHANOL    EKG  EKG Interpretation None       Radiology No results found.  Procedures Procedures (including critical care time)  Medications Ordered in ED Medications - No data to display  Results for orders placed or performed during the hospital encounter of 08/17/16  Ethanol  Result Value Ref Range   Alcohol, Ethyl (B) <5 <5 mg/dL  Rapid urine drug screen (hospital performed)  Result Value Ref Range   Opiates NONE DETECTED NONE DETECTED   Cocaine POSITIVE (A) NONE DETECTED   Benzodiazepines NONE DETECTED NONE DETECTED   Amphetamines NONE DETECTED NONE DETECTED   Tetrahydrocannabinol NONE DETECTED NONE DETECTED   Barbiturates NONE DETECTED NONE DETECTED  Urinalysis, Routine w reflex microscopic (not at [facility])  Result Value Ref Range   Color, Urine STRAW (A) YELLOW   APPearance CLOUDY (A) CLEAR   Specific Gravity, Urine 1.015 1.005 - 1.030   pH 5.5 5.0 - 8.0   Glucose, UA NEGATIVE NEGATIVE mg/dL   Hgb urine dipstick NEGATIVE NEGATIVE   Bilirubin Urine NEGATIVE NEGATIVE   Ketones, ur NEGATIVE NEGATIVE mg/dL   Protein, ur NEGATIVE NEGATIVE mg/dL   Nitrite NEGATIVE NEGATIVE    Leukocytes, UA TRACE (A) NEGATIVE  CBC with Differential  Result Value Ref Range  WBC 10.4 4.0 - 10.5 K/uL   RBC 3.98 3.87 - 5.11 MIL/uL   Hemoglobin 11.6 (L) 12.0 - 15.0 g/dL   HCT 16.1 (L) 09.6 - 04.5 %   MCV 89.2 78.0 - 100.0 fL   MCH 29.1 26.0 - 34.0 pg   MCHC 32.7 30.0 - 36.0 g/dL   RDW 40.9 81.1 - 91.4 %   Platelets 264 150 - 400 K/uL   Neutrophils Relative % 74 %   Neutro Abs 7.6 1.7 - 7.7 K/uL   Lymphocytes Relative 19 %   Lymphs Abs 2.0 0.7 - 4.0 K/uL   Monocytes Relative 6 %   Monocytes Absolute 0.7 0.1 - 1.0 K/uL   Eosinophils Relative 1 %   Eosinophils Absolute 0.1 0.0 - 0.7 K/uL   Basophils Relative 0 %   Basophils Absolute 0.0 0.0 - 0.1 K/uL  Comprehensive metabolic panel  Result Value Ref Range   Sodium 135 135 - 145 mmol/L   Potassium 3.5 3.5 - 5.1 mmol/L   Chloride 100 (L) 101 - 111 mmol/L   CO2 24 22 - 32 mmol/L   Glucose, Bld 79 65 - 99 mg/dL   BUN 24 (H) 6 - 20 mg/dL   Creatinine, Ser 7.82 (H) 0.44 - 1.00 mg/dL   Calcium 8.9 8.9 - 95.6 mg/dL   Total Protein 7.3 6.5 - 8.1 g/dL   Albumin 3.7 3.5 - 5.0 g/dL   AST 31 15 - 41 U/L   ALT 20 14 - 54 U/L   Alkaline Phosphatase 77 38 - 126 U/L   Total Bilirubin 0.7 0.3 - 1.2 mg/dL   GFR calc non Af Amer 44 (L) >60 mL/min   GFR calc Af Amer 51 (L) >60 mL/min   Anion gap 11 5 - 15  Urine microscopic-add on  Result Value Ref Range   Squamous Epithelial / LPF 6-30 (A) NONE SEEN   WBC, UA 6-30 0 - 5 WBC/hpf   RBC / HPF NONE SEEN 0 - 5 RBC/hpf   Bacteria, UA MANY (A) NONE SEEN   Casts HYALINE CASTS (A) NEGATIVE   No results found. Initial Impression / Assessment and Plan / ED Course  I have reviewed the triage vital signs and the nursing notes.  Pertinent labs & imaging results that were available during my care of the patient were reviewed by me and considered in my medical decision making (see chart for details).  Clinical Course     Patient involuntarily committed for psychiatric evaluation by me. IVC  affidavit and first exam form filled out by me. She does not clear medically due to mild rhabdomyolysis with acute kidney injury. Plan intravenous hydration. I consulted Dr.Niu. Plan 23 hour observation, telemetry. She will require psychiatric evaluation once stable from a medical standpoint.  Final Clinical Impressions(s) / ED Diagnoses  Diagnoses #1 delusional behavior #2 acute kidney injury #3 rhabdomyolysis #4 cocaine abuse Final diagnoses:  None    New Prescriptions New Prescriptions   No medications on file     Doug Sou, MD 08/17/16 2355

## 2016-08-17 NOTE — H&P (Signed)
History and Physical    Ariana Jensen ZOX:[id number] DOB: 07/01/1966 DOA: 08/17/2016  Referring MD/Jensen/PA:   PCP: Ariana BarnaclePLACEY,Ariana Jensen   Patient coming from:  The patient is coming from home.  At baseline, pt is independent for most of ADL.   Chief Complaint: Hallucination and urinary retention  HPI: Ariana BaloSamantha Jensen is a 50 y.o. female with medical history significant of hypertension, GERD, TB, polysubstance abuse (tobacco, opioid dependence, cocaine), HCV antibody positive, dCHF, chronic skin boils, who presents with hallucination and urinary retention.  Per EDP, patient has brought a bottle of dishwashing liquid with her, stating that there are objects living in the dishwashing liquid which are invading her body and trying to kill her. She states has been going on for several years. She reports mild suprapubic abdominal pain and difficulty urinating. No dysuria or burning on urination. No fever or chills. She states that she nausea and vomited twice without blood in the vomitus. No diarrhea. She does not have cough, chest pain. She states that she has shortness of breath sometimes. She admits to using cocaine several days ago. Denies any IV drug use. Not drinking alcohol. Pt has chronic skin boils all over the body. She denies suicidal or homicidal ideations.   ED Course: pt was found to have positive UDS for cocaine, urinalysis trace amount of leukocytes, AKI with Cre 1.38, temperature normal, transient tachycardia, O2 saturation 85-->98% on RA. CK 949, negative HIV screen, alcohol level less than 5. Bladder scan showed 355 ml of urine. Foley cath is placed. Patient is placed on telemetry bed for observation.   Review of Systems:   General: no fevers, chills, no changes in body weight, has fatigue HEENT: no blurry vision, hearing changes or sore throat Respiratory: has dyspnea, no coughing, wheezing CV: no chest pain, no palpitations GI: has nausea, vomiting, no abdominal pain,  diarrhea, constipation GU: no dysuria, burning on urination, increased urinary frequency, hematuria. Has urinary retention  Ext: no leg edema Neuro: no unilateral weakness, numbness, or tingling, no vision change or hearing loss Skin: Has skin boils MSK: No muscle spasm, no deformity, no limitation of range of movement in spin Heme: No easy bruising.  Travel history: No recent long distant travel.  Allergy: No Known Allergies  Past Medical History:  Diagnosis Date  . ASCUS (atypical squamous cells of undetermined significance) on Pap smear   . Cocaine abuse    crack cocaine  . GERD (gastroesophageal reflux disease)   . Hepatitis    Hep C  . Hepatitis C antibody test positive   . Hypertension   . Opioid dependence (HCC)   . Trichomonas 04/17/2011   Diagnosed 04/02/11 during hospitalization, treated with Flagyl 2g, patient instructed to have partner treated (must follow-up)   . Tuberculosis    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 age 50, now s/p 1-yr of multidrug treatment (dates unknown)    Past Surgical History:  Procedure Laterality Date  . CESAREAN SECTION  1984  . DILATION AND CURETTAGE OF UTERUS    . I&D EXTREMITY  10/18/2011   Procedure: IRRIGATION AND DEBRIDEMENT EXTREMITY;  Surgeon: Sharma CovertFred W Ortmann, MD;  Location: [facility] OR;  Service: Orthopedics;  Laterality: Left;  . I&D EXTREMITY  10/21/2011   Procedure: IRRIGATION AND DEBRIDEMENT EXTREMITY;  Surgeon: Sharma CovertFred W Ortmann, MD;  Location: [facility] OR;  Service: Orthopedics;  Laterality: Left;  . SALPINGECTOMY Left March 2010   ectopic pregnancy    Social History:  reports that she has been smoking.  She  has a 20.00 pack-year smoking history. She has never used smokeless tobacco. She reports that she drinks alcohol. She reports that she uses drugs, including "Crack" cocaine and Oxycodone.  Family History:  Family History  Problem Relation Age of Onset  . Heart attack Mother   . Heart attack Father   . Heart attack Paternal  Grandmother   . Heart attack Paternal Grandfather   . Cirrhosis Brother      Prior to Admission medications   Medication Sig Start Date End Date Taking? Authorizing Provider  amLODipine (NORVASC) 10 MG tablet Take 1 tablet (10 mg total) by mouth daily. 07/27/16  Yes Ardith Darkaleb M Parker, MD  camphor-menthol [facility](SARNA) lotion Apply 1 application topically as needed for itching. 07/27/16  Yes Ardith Darkaleb M Parker, MD  cetirizine (ZYRTEC) 10 MG tablet Take 1 tablet (10 mg total) by mouth daily. Patient taking differently: Take 10 mg by mouth daily as needed for allergies.  07/27/16  Yes Ardith Darkaleb M Parker, MD  cloNIDine (CATAPRES) 0.1 MG tablet Take 0.1 mg by mouth at bedtime.   Yes Historical Provider, MD  cyclobenzaprine (FLEXERIL) 10 MG tablet Take 10 mg by mouth daily.   Yes Historical Provider, MD  ferrous sulfate 325 (65 FE) MG tablet Take 325 mg by mouth daily with breakfast.   Yes Historical Provider, MD  furosemide (LASIX) 20 MG tablet Take 20 mg by mouth daily.   Yes Historical Provider, MD  gabapentin (NEURONTIN) 300 MG capsule Take 300 mg by mouth 2 (two) times daily.   Yes Historical Provider, MD  hydrochlorothiazide (HYDRODIURIL) 25 MG tablet Take 1 tablet (25 mg total) by mouth daily. 04/02/16  Yes Ardith Darkaleb M Parker, MD  hydrOXYzine (ATARAX/VISTARIL) 10 MG tablet Take 10 mg by mouth 3 (three) times daily as needed (for mild itching.).   Yes Historical Provider, MD  hydrOXYzine (ATARAX/VISTARIL) 25 MG tablet Take 1 tablet (25 mg total) by mouth every 8 (eight) hours as needed for itching (for severe itching.). Patient taking differently: Take 25 mg by mouth 4 (four) times daily.  07/27/16  Yes Ardith Darkaleb M Parker, MD  ibuprofen (ADVIL,MOTRIN) 200 MG tablet Take 400-600 mg by mouth every 6 (six) hours as needed (for pain.).   Yes Historical Provider, MD  lisinopril (PRINIVIL,ZESTRIL) 20 MG tablet Take 1 tablet (20 mg total) by mouth daily. 04/02/16  Yes Ardith Darkaleb M Parker, MD  methadone (DOLOPHINE) 5 MG/5ML solution Take  80 mg by mouth every morning.    Yes Historical Provider, MD  methocarbamol (ROBAXIN) 500 MG tablet Take 1 tablet (500 mg total) by mouth 2 (two) times daily. Patient taking differently: Take 500 mg by mouth 2 (two) times daily as needed for muscle spasms.  07/27/16  Yes Tatyana Kirichenko, PA-C  montelukast (SINGULAIR) 10 MG tablet Take 10 mg by mouth at bedtime.   Yes Historical Provider, MD  polyethylene glycol powder (GLYCOLAX/MIRALAX) powder Take 34 g by mouth 2 (two) times daily as needed. Take until you have a bowel movement. Patient taking differently: Take 17-34 g by mouth 2 (two) times daily as needed (for constipation.).  11/12/15  Yes Yolande Jollyaleb G Melancon, MD  ranitidine (ZANTAC) 150 MG tablet Take 150 mg by mouth daily.   Yes Historical Provider, MD  predniSONE (DELTASONE) 20 MG tablet Take 2 tablets (40 mg total) by mouth daily. Patient not taking: Reported on 08/17/2016 08/15/16   Bethel BornKelly Marie Gekas, PA-C  triamcinolone cream (KENALOG) 0.1 % Apply 1 application topically 2 (two) times daily. Patient not taking:  Reported on 08/17/2016 08/15/16   Bethel Born, PA-C    Physical Exam: Vitals:   08/18/16 0000 08/18/16 0030 08/18/16 0100 08/18/16 0130  BP: 125/93 (!) 135/102 103/72 122/82  Pulse: 69 71 63 67  Resp: 11 13 10 11   Temp:      TempSrc:      SpO2: 95% 95% 93% 94%  Weight:      Height:       General: Not in acute distress HEENT:       Eyes: PERRL, EOMI, no scleral icterus.       ENT: No discharge from the ears and nose, no pharynx injection, no tonsillar enlargement.        Neck: No JVD, no bruit, no mass felt. Heme: No neck lymph node enlargement. Cardiac: S1/S2, RRR, No murmurs, No gallops or rubs. Respiratory:  No rales, wheezing, rhonchi or rubs. GI: Soft, nondistended, mild tenderness in suprapubic area, no rebound pain, no organomegaly, BS present. GU: No hematuria Ext: No pitting leg edema bilaterally. 2+DP/PT pulse bilaterally. Musculoskeletal: No joint  deformities, No joint redness or warmth, no limitation of ROM in spin. Skin: has skin boils all over the body. Neuro: Alert, oriented X3, cranial nerves II-XII grossly intact, moves all extremities normally.  Psych: No suicidal or hemocidal ideation. Has hallucination.  Labs on Admission: I have personally reviewed following labs and imaging studies  CBC:  Recent Labs Lab 08/17/16 1924  WBC 10.4  NEUTROABS 7.6  HGB 11.6*  HCT 35.5*  MCV 89.2  PLT 264   Basic Metabolic Panel:  Recent Labs Lab 08/17/16 1924  NA 135  K 3.5  CL 100*  CO2 24  GLUCOSE 79  BUN 24*  CREATININE 1.38*  CALCIUM 8.9   GFR: Estimated Creatinine Clearance: 45.6 mL/min (by C-G formula based on SCr of 1.38 mg/dL (H)). Liver Function Tests:  Recent Labs Lab 08/17/16 1924  AST 31  ALT 20  ALKPHOS 77  BILITOT 0.7  PROT 7.3  ALBUMIN 3.7    Recent Labs Lab 08/17/16 1924  LIPASE 21   No results for input(s): AMMONIA in the last 168 hours. Coagulation Profile: No results for input(s): INR, PROTIME in the last 168 hours. Cardiac Enzymes:  Recent Labs Lab 08/17/16 2212  CKTOTAL 949*   BNP (last 3 results) No results for input(s): PROBNP in the last 8760 hours. HbA1C: No results for input(s): HGBA1C in the last 72 hours. CBG: No results for input(s): GLUCAP in the last 168 hours. Lipid Profile: No results for input(s): CHOL, HDL, LDLCALC, TRIG, CHOLHDL, LDLDIRECT in the last 72 hours. Thyroid Function Tests: No results for input(s): TSH, T4TOTAL, FREET4, T3FREE, THYROIDAB in the last 72 hours. Anemia Panel: No results for input(s): VITAMINB12, FOLATE, FERRITIN, TIBC, IRON, RETICCTPCT in the last 72 hours. Urine analysis:    Component Value Date/Time   COLORURINE STRAW (A) 08/17/2016 1924   APPEARANCEUR CLOUDY (A) 08/17/2016 1924   LABSPEC 1.015 08/17/2016 1924   PHURINE 5.5 08/17/2016 1924   GLUCOSEU NEGATIVE 08/17/2016 1924   HGBUR NEGATIVE 08/17/2016 1924   BILIRUBINUR  NEGATIVE 08/17/2016 1924   KETONESUR NEGATIVE 08/17/2016 1924   PROTEINUR NEGATIVE 08/17/2016 1924   UROBILINOGEN 1.0 04/02/2015 0752   NITRITE NEGATIVE 08/17/2016 1924   LEUKOCYTESUR TRACE (A) 08/17/2016 1924   Sepsis Labs: @LABRCNTIP (procalcitonin:4,lacticidven:4) )No results found for this or any previous visit (from the past 240 hour(s)).   Radiological Exams on Admission: No results found.   EKG: Independently reviewed.  Sinus  rhythm, QTC 560, early R-wave progression   Assessment/Plan Principal Problem:   AKI (acute kidney injury) (HCC) Active Problems:   Polysubstance abuse   Opioid dependence (HCC)   HTN (hypertension)   Tobacco abuse   GERD (gastroesophageal reflux disease)   Prolonged QT interval   Nausea & vomiting   Hallucination   Acute kidney injury (HCC)   Boils   Rhabdomyolysis   Urinary retention   AKI (acute kidney injury) (HCC): Cre 1.38. Likely due to Combination of urinary retention and dehydration with continuation diuretics, ibuprofen and lisinopril. Pt has mild rhabdomyolysis with CK 949. -will place on tele bed for obs - IVF: received 2L NS in ED - will continue NS at 75 cc/h - Check  FeUrea - Follow up renal function by BMP - Hold HCTZ, lasix, ibuprofen and lisinopril -repeat CK in morning  Polysubstance abuse: Including tobacco, cocaine and opioid dependence -Did counseling about the importance of quitting substance use -nicotine patch -Consult to social work  Hallucination: Etiology is not clear. May be due to cocaine abuse, psychosis is also possible. -may consult to [facility] in AM  Opioid dependence (HCC): -continue home methodon  HTN:  -Hold HCTZ, lasix, ibuprofen and lisinopril as above -prn hydralazine -Continue clonidine, amlodipine  GERD (gastroesophageal reflux disease): pt is on Zantac -will not switch to pepcid due to QTC prolongation -When necessary Maalox  Nausea & vomiting: Etiology is not clear. May be due to  substance abuse -Check lipase -When necessary hydroxyzine  Chronic skin boils: -continue to wound care  Urinary retention: -Foley cath placed   DVT ppx: SQ Lovenox Code Status: Full code Family Communication: None at bed side.  Disposition Plan:  Anticipate discharge back to previous home environment Consults called:  none Admission status: Obs / tele     Date of Service 08/18/2016    Lorretta Harp [facility] Pager [phone]  If 7PM-7AM, please contact night-coverage [email or web address] Password [email or web address] 08/18/2016, 2:05 AM

## 2016-08-17 NOTE — ED Triage Notes (Signed)
Pt. reports visual hallucinations for several weeks , denies auditory hallucinations . No suicidal ideations. Pt. added chronic multiple generalized skin abscesses for several years . Denies fever or chills .

## 2016-08-17 NOTE — ED Notes (Signed)
Pt ambulatory to restroom with steady gait noted; pt able to toilet independently

## 2016-08-18 ENCOUNTER — Encounter (HOSPITAL_COMMUNITY): Payer: Self-pay | Admitting: General Practice

## 2016-08-18 DIAGNOSIS — L0292 Furuncle, unspecified: Secondary | ICD-10-CM | POA: Diagnosis present

## 2016-08-18 DIAGNOSIS — N179 Acute kidney failure, unspecified: Secondary | ICD-10-CM | POA: Diagnosis present

## 2016-08-18 DIAGNOSIS — R339 Retention of urine, unspecified: Secondary | ICD-10-CM | POA: Diagnosis present

## 2016-08-18 DIAGNOSIS — M6282 Rhabdomyolysis: Secondary | ICD-10-CM | POA: Diagnosis present

## 2016-08-18 LAB — BASIC METABOLIC PANEL
ANION GAP: 8 (ref 5–15)
BUN: 20 mg/dL (ref 6–20)
CHLORIDE: 108 mmol/L (ref 101–111)
CO2: 23 mmol/L (ref 22–32)
Calcium: 7.8 mg/dL — ABNORMAL LOW (ref 8.9–10.3)
Creatinine, Ser: 1.11 mg/dL — ABNORMAL HIGH (ref 0.44–1.00)
GFR calc Af Amer: >60 mL/min (ref >60)
GFR, EST NON AFRICAN AMERICAN: 58 mL/min — AB (ref >60)
GLUCOSE: 101 mg/dL — AB (ref 65–99)
POTASSIUM: 3.5 mmol/L (ref 3.5–5.1)
Sodium: 139 mmol/L (ref 135–145)

## 2016-08-18 LAB — CK: Total CK: 894 U/L — ABNORMAL HIGH (ref 38–234)

## 2016-08-18 LAB — LIPASE, BLOOD: LIPASE: 21 U/L (ref 11–51)

## 2016-08-18 LAB — CBC
HEMATOCRIT: 33.7 % — AB (ref 36.0–46.0)
HEMOGLOBIN: 11 g/dL — AB (ref 12.0–15.0)
MCH: 29.5 pg (ref 26.0–34.0)
MCHC: 32.6 g/dL (ref 30.0–36.0)
MCV: 90.3 fL (ref 78.0–100.0)
Platelets: 215 10*3/uL (ref 150–400)
RBC: 3.73 MIL/uL — ABNORMAL LOW (ref 3.87–5.11)
RDW: 15.6 % — ABNORMAL HIGH (ref 11.5–15.5)
WBC: 7.4 10*3/uL (ref 4.0–10.5)

## 2016-08-18 LAB — HCG, QUANTITATIVE, PREGNANCY: HCG, BETA CHAIN, QUANT, S: 6 m[IU]/mL — AB (ref <5)

## 2016-08-18 LAB — RPR: RPR Ser Ql: NONREACTIVE

## 2016-08-18 LAB — CREATININE, URINE, RANDOM: Creatinine, Urine: 133.06 mg/dL

## 2016-08-18 LAB — BRAIN NATRIURETIC PEPTIDE: B NATRIURETIC PEPTIDE 5: 8.1 pg/mL (ref 0.0–100.0)

## 2016-08-18 MED ORDER — PNEUMOCOCCAL VAC POLYVALENT 25 MCG/0.5ML IJ INJ
0.5000 mL | INJECTION | INTRAMUSCULAR | Status: DC
  Filled 2016-08-18: qty 0.5

## 2016-08-18 MED ORDER — SODIUM CHLORIDE 0.9% FLUSH
3.0000 mL | Freq: Two times a day (BID) | INTRAVENOUS | Status: DC
  Administered 2016-08-18: 3 mL via INTRAVENOUS

## 2016-08-18 MED ORDER — CAMPHOR-MENTHOL 0.5-0.5 % EX LOTN: 1.0000 "application " | TOPICAL_LOTION | CUTANEOUS | Status: DC | PRN

## 2016-08-18 MED ORDER — MONTELUKAST SODIUM 10 MG PO TABS
10.0000 mg | ORAL_TABLET | Freq: Every day | ORAL | Status: DC
  Administered 2016-08-18 (×2): 10 mg via ORAL
  Filled 2016-08-18 (×2): qty 1

## 2016-08-18 MED ORDER — FERROUS SULFATE 325 (65 FE) MG PO TABS
325.0000 mg | ORAL_TABLET | Freq: Every day | ORAL | Status: DC
Start: 2016-08-18 — End: 2016-08-19
  Administered 2016-08-18 – 2016-08-19 (×2): 325 mg via ORAL
  Filled 2016-08-18 (×2): qty 1

## 2016-08-18 MED ORDER — NICOTINE 21 MG/24HR TD PT24
21.0000 mg | MEDICATED_PATCH | Freq: Every day | TRANSDERMAL | Status: DC
  Administered 2016-08-18 – 2016-08-19 (×2): 21 mg via TRANSDERMAL
  Filled 2016-08-18 (×3): qty 1

## 2016-08-18 MED ORDER — POLYETHYLENE GLYCOL 3350 17 G PO PACK: 17.0000 g | PACK | Freq: Two times a day (BID) | ORAL | Status: DC | PRN

## 2016-08-18 MED ORDER — ALUM & MAG HYDROXIDE-SIMETH 200-200-20 MG/5ML PO SUSP: 30.0000 mL | Freq: Four times a day (QID) | ORAL | Status: DC | PRN

## 2016-08-18 MED ORDER — HYDRALAZINE HCL 20 MG/ML IJ SOLN: 5.0000 mg | INTRAMUSCULAR | Status: DC | PRN

## 2016-08-18 MED ORDER — CLONIDINE HCL 0.1 MG PO TABS
0.1000 mg | ORAL_TABLET | Freq: Every day | ORAL | Status: DC
  Administered 2016-08-18: 0.1 mg via ORAL
  Filled 2016-08-18 (×2): qty 1

## 2016-08-18 MED ORDER — METHOCARBAMOL 500 MG PO TABS
500.0000 mg | ORAL_TABLET | Freq: Two times a day (BID) | ORAL | Status: DC | PRN
  Administered 2016-08-18: 500 mg via ORAL
  Filled 2016-08-18: qty 1

## 2016-08-18 MED ORDER — METHADONE HCL 10 MG PO TABS
80.0000 mg | ORAL_TABLET | Freq: Every day | ORAL | Status: DC
  Administered 2016-08-18 – 2016-08-19 (×2): 80 mg via ORAL
  Filled 2016-08-18 (×2): qty 8

## 2016-08-18 MED ORDER — CYCLOBENZAPRINE HCL 10 MG PO TABS
10.0000 mg | ORAL_TABLET | Freq: Every day | ORAL | Status: DC
  Administered 2016-08-18 – 2016-08-19 (×2): 10 mg via ORAL
  Filled 2016-08-18 (×2): qty 1

## 2016-08-18 MED ORDER — GABAPENTIN 300 MG PO CAPS
300.0000 mg | ORAL_CAPSULE | Freq: Two times a day (BID) | ORAL | Status: DC
  Administered 2016-08-18 – 2016-08-19 (×4): 300 mg via ORAL
  Filled 2016-08-18 (×4): qty 1

## 2016-08-18 MED ORDER — ENOXAPARIN SODIUM 40 MG/0.4ML ~~LOC~~ SOLN
40.0000 mg | Freq: Every day | SUBCUTANEOUS | Status: DC
  Filled 2016-08-18 (×3): qty 0.4

## 2016-08-18 MED ORDER — HYDROXYZINE HCL 25 MG PO TABS
25.0000 mg | ORAL_TABLET | Freq: Four times a day (QID) | ORAL | Status: DC | PRN
  Administered 2016-08-18 – 2016-08-19 (×2): 25 mg via ORAL
  Filled 2016-08-18 (×2): qty 1

## 2016-08-18 MED ORDER — BACITRACIN-NEOMYCIN-POLYMYXIN OINTMENT TUBE
TOPICAL_OINTMENT | Freq: Three times a day (TID) | CUTANEOUS | Status: DC
  Administered 2016-08-18 (×2): via TOPICAL
  Administered 2016-08-19 (×2): 1 via TOPICAL
  Filled 2016-08-18 (×2): qty 15

## 2016-08-18 MED ORDER — ALBUTEROL SULFATE (2.5 MG/3ML) 0.083% IN NEBU: 2.5000 mg | INHALATION_SOLUTION | RESPIRATORY_TRACT | Status: DC | PRN

## 2016-08-18 MED ORDER — SODIUM CHLORIDE 0.9 % IV SOLN
INTRAVENOUS | Status: DC
  Administered 2016-08-18: 01:00:00 via INTRAVENOUS

## 2016-08-18 MED ORDER — AMLODIPINE BESYLATE 10 MG PO TABS
10.0000 mg | ORAL_TABLET | Freq: Every day | ORAL | Status: DC
  Administered 2016-08-18 – 2016-08-19 (×2): 10 mg via ORAL
  Filled 2016-08-18: qty 1
  Filled 2016-08-18: qty 2

## 2016-08-18 MED ORDER — LORATADINE 10 MG PO TABS
10.0000 mg | ORAL_TABLET | Freq: Every day | ORAL | Status: DC
  Administered 2016-08-18 – 2016-08-19 (×2): 10 mg via ORAL
  Filled 2016-08-18 (×2): qty 1

## 2016-08-18 NOTE — ED Notes (Signed)
Pt requesting crushed ice and sprite at this time. Given these items. No other questions/concerns

## 2016-08-18 NOTE — ED Notes (Signed)
Called main lab to add on BNP, lipase, hcg

## 2016-08-18 NOTE — ED Notes (Signed)
A Heart Healthy Diet was ordered for patient.

## 2016-08-18 NOTE — ED Notes (Signed)
Bladder scan showing . Instructed pt to void and will measure again

## 2016-08-18 NOTE — Progress Notes (Signed)
PROGRESS NOTE    Ariana BaloSamantha Dioguardi  WJX:[id number] DOB: 01/19/1966 DOA: 08/17/2016 PCP: Jacklynn BarnaclePLACEY,MARY H, NP   Subjective: Patient complains of itching of skin sores and dry mouth. She denies hallucinations, homicidal ideation, suicidal ideation. Wants to go home.   Brief Narrative:  Ariana Jensen is a 50 y.o. female with medical history significant of hypertension, GERD, TB, polysubstance abuse (tobacco, opioid dependence, cocaine), HCV antibody positive, dCHF, chronic skin boils, who presents with hallucination and urinary retention. UA shows trace leukocytes. Bladder scan was 355mL. Foley cath now in place. UDS is positive for cocaine. Patient is seen daily at a methadone clinic for the past seven years. Of note, patient brought a bottle of dishwashing liquid with her, stating that there are objects living in the dishwashing liquid which are invading her body. Patient also has AKI. Cre 1.38 yesterday, now 1.11. CK 949, repeat CK 894.    Assessment & Plan:   Principal Problem:   AKI (acute kidney injury) (HCC) Active Problems:   Polysubstance abuse   Opioid dependence (HCC)   HTN (hypertension)   Tobacco abuse   GERD (gastroesophageal reflux disease)   Prolonged QT interval   Nausea & vomiting   Hallucination   Acute kidney injury (HCC)   Boils   Rhabdomyolysis   Urinary retention   AKI (acute kidney injury) (HCC):  - Cre 1.38 yesterday, now 1.11. Likely due to Combination of urinary retention and dehydration. Pt has mild rhabdomyolysis with CK 949, repeat CK 894. - Will continue NS  - Continue to hold HCTZ, lasix, ibuprofen and lisinopril  Polysubstance abuse:  - Including tobacco, cocaine and opioid dependence - Awaiting social work consult  Hallucination:  - Etiology is not clear. May be due to cocaine abuse - Patient is IVC - Awaiting psych consult prior to discharge  Opioid dependence [facility](HCC): - Continue treatment at methadone clinic  HTN:  - Hold HCTZ,  lasix, ibuprofen and lisinopril as above - Continue Hydralazine as needed - Continue clonidine, amlodipine  GERD (gastroesophageal reflux disease):  - Pt is on Zantac and prn Maalox  Nausea & vomiting:  - Etiology is not clear. May be due to substance abuse. Patient says this is improving. - Lipase 21 - When necessary hydroxyzine  Chronic skin boils: - Apply bacitracin to affected areas  Urinary retention: - Bladder scan 355mL. Foley cath placed - UA shows trace leukocytes, urine culture pending, try to pull the catheter in AM and start voiding trails.   DVT prophylaxis: Lovenox Code Status: Full code Family Communication: None at bedside Disposition Plan: Discharge after psych consults.   Consultants:   Behavioral Health  Procedures:   None  Antimicrobials:   Bacitracin ointment      Objective: Vitals:   08/18/16 0900 08/18/16 0930 08/18/16 0936 08/18/16 1000  BP: 147/98 128/95 128/95 140/85  Pulse: 62 65  64  Resp: (!) 9 12  10   Temp:      TempSrc:      SpO2: 95% 90%  94%  Weight:      Height:        Intake/Output Summary (Last 24 hours) at 08/18/16 1051 Last data filed at 08/18/16 0116  Gross per 24 hour  Intake             2000 ml  Output                0 ml  Net             2000  ml   Filed Weights   08/17/16 1917  Weight: 66.2 kg (146 lb)    Examination:  General exam: Appears uncomfortable but in no acute distress. No gross abnormalities Respiratory system: Clear to auscultation. Respiratory effort normal. Cardiovascular system: S1 & S2 heard, RRR. No JVD, murmurs, rubs, gallops or clicks. No pedal edema. Central nervous system: Alert and oriented. No focal neurological deficits. Skin: Boils on bilateral upper extremities, lower extremities, face and back.  Psychiatry: Patient is anxious that her house might be infested.     Data Reviewed: I have personally reviewed following labs and imaging studies  CBC:  Recent Labs Lab  08/17/16 1924 08/18/16 0214  WBC 10.4 7.4  NEUTROABS 7.6  --   HGB 11.6* 11.0*  HCT 35.5* 33.7*  MCV 89.2 90.3  PLT 264 215   Basic Metabolic Panel:  Recent Labs Lab 08/17/16 1924 08/18/16 0214  NA 135 139  K 3.5 3.5  CL 100* 108  CO2 24 23  GLUCOSE 79 101*  BUN 24* 20  CREATININE 1.38* 1.11*  CALCIUM 8.9 7.8*   GFR: Estimated Creatinine Clearance: 56.7 mL/min (by C-G formula based on SCr of 1.11 mg/dL (H)). Liver Function Tests:  Recent Labs Lab 08/17/16 1924  AST 31  ALT 20  ALKPHOS 77  BILITOT 0.7  PROT 7.3  ALBUMIN 3.7    Recent Labs Lab 08/17/16 1924  LIPASE 21   No results for input(s): AMMONIA in the last 168 hours. Coagulation Profile: No results for input(s): INR, PROTIME in the last 168 hours. Cardiac Enzymes:  Recent Labs Lab 08/17/16 2212 08/18/16 0214  CKTOTAL 949* 894*   BNP (last 3 results) No results for input(s): PROBNP in the last 8760 hours. HbA1C: No results for input(s): HGBA1C in the last 72 hours. CBG: No results for input(s): GLUCAP in the last 168 hours. Lipid Profile: No results for input(s): CHOL, HDL, LDLCALC, TRIG, CHOLHDL, LDLDIRECT in the last 72 hours. Thyroid Function Tests: No results for input(s): TSH, T4TOTAL, FREET4, T3FREE, THYROIDAB in the last 72 hours. Anemia Panel: No results for input(s): VITAMINB12, FOLATE, FERRITIN, TIBC, IRON, RETICCTPCT in the last 72 hours. Sepsis Labs: No results for input(s): PROCALCITON, LATICACIDVEN in the last 168 hours.  No results found for this or any previous visit (from the past 240 hour(s)).       Radiology Studies: No results found.      Scheduled Meds: . amLODipine  10 mg Oral Daily  . cloNIDine  0.1 mg Oral QHS  . cyclobenzaprine  10 mg Oral Daily  . enoxaparin (LOVENOX) injection  40 mg Subcutaneous Daily  . ferrous sulfate  325 mg Oral Q breakfast  . gabapentin  300 mg Oral BID  . loratadine  10 mg Oral Daily  . methadone  80 mg Oral Q breakfast    . montelukast  10 mg Oral QHS  . nicotine  21 mg Transdermal Daily  . sodium chloride flush  3 mL Intravenous Q12H   Continuous Infusions: . sodium chloride 100 mL/hr at 08/18/16 0117     LOS: 0 days    Time spent: 25    Randa LynnEmily Williams, PA-S  Wrote some of this note [facility] Pager [phone]  If 7PM-7AM, please contact night-coverage [email or web address] Password [email or web address] 08/18/2016, 10:52 AM

## 2016-08-18 NOTE — ED Notes (Signed)
After urination, still showing on bladder scan. Niu MD aware, will place order for foley catheter for AUR

## 2016-08-18 NOTE — Progress Notes (Signed)
Notified attending MD that patient is refusing lovenox injection and IV fluids

## 2016-08-18 NOTE — Consult Note (Signed)
WOC Nurse wound consult note Reason for Consult: excoriation and lesions  Patient with know drug use, but no IVD.  She reports last use of IVD was 7 years ago.  Presents with dried lesions over the arms, chest, legs (mainly below the knees), not on her back therefore I do not believe these to be beg bugs. Unclear etiology, topical steroids (which she has used many different ones) have not worked. She reports PO steroids improved the lesions but when she tapered off they came right back. Wound type: dry, scabbed lesions Measurement: most are less than 0.5cm in diameter Wound bed: clean, not infected, scabbed  Dressing procedure/placement/frequency: No topical wound care needed at this time.  Notified hospitalist of improvement per patient report with PO steroids.  Discussed POC with patient and bedside nurse.  Re consult if needed, will not follow at this time. Thanks  Tarig Zimmers M.D.C. [employer] MSN, RN,CWOCN, CNS [phone])

## 2016-08-19 DIAGNOSIS — Z79899 Other long term (current) drug therapy: Secondary | ICD-10-CM

## 2016-08-19 DIAGNOSIS — F14150 Cocaine abuse with cocaine-induced psychotic disorder with delusions: Secondary | ICD-10-CM

## 2016-08-19 DIAGNOSIS — Z8249 Family history of ischemic heart disease and other diseases of the circulatory system: Secondary | ICD-10-CM

## 2016-08-19 DIAGNOSIS — F17210 Nicotine dependence, cigarettes, uncomplicated: Secondary | ICD-10-CM

## 2016-08-19 DIAGNOSIS — F1910 Other psychoactive substance abuse, uncomplicated: Secondary | ICD-10-CM

## 2016-08-19 LAB — CK: Total CK: 399 U/L — ABNORMAL HIGH (ref 38–234)

## 2016-08-19 LAB — COMPREHENSIVE METABOLIC PANEL
ALK PHOS: 65 U/L (ref 38–126)
ALT: 22 U/L (ref 14–54)
AST: 35 U/L (ref 15–41)
Albumin: 3.2 g/dL — ABNORMAL LOW (ref 3.5–5.0)
Anion gap: 5 (ref 5–15)
BUN: 17 mg/dL (ref 6–20)
CALCIUM: 8.9 mg/dL (ref 8.9–10.3)
CO2: 23 mmol/L (ref 22–32)
CREATININE: 0.64 mg/dL (ref 0.44–1.00)
Chloride: 110 mmol/L (ref 101–111)
Glucose, Bld: 74 mg/dL (ref 65–99)
Potassium: 4.6 mmol/L (ref 3.5–5.1)
Sodium: 138 mmol/L (ref 135–145)
Total Bilirubin: 0.5 mg/dL (ref 0.3–1.2)
Total Protein: 6.3 g/dL — ABNORMAL LOW (ref 6.5–8.1)

## 2016-08-19 LAB — URINE CULTURE

## 2016-08-19 LAB — CBC
HCT: 34.8 % — ABNORMAL LOW (ref 36.0–46.0)
HEMOGLOBIN: 10.9 g/dL — AB (ref 12.0–15.0)
MCH: 28.8 pg (ref 26.0–34.0)
MCHC: 31.3 g/dL (ref 30.0–36.0)
MCV: 91.8 fL (ref 78.0–100.0)
Platelets: 200 10*3/uL (ref 150–400)
RBC: 3.79 MIL/uL — AB (ref 3.87–5.11)
RDW: 15.7 % — ABNORMAL HIGH (ref 11.5–15.5)
WBC: 6.3 10*3/uL (ref 4.0–10.5)

## 2016-08-19 LAB — UREA NITROGEN, URINE: UREA NITROGEN UR: 428 mg/dL

## 2016-08-19 MED ORDER — NICOTINE 21 MG/24HR TD PT24: 21.0000 mg | MEDICATED_PATCH | Freq: Every day | TRANSDERMAL | 0 refills | Status: DC

## 2016-08-19 MED ORDER — BACITRACIN-NEOMYCIN-POLYMYXIN OINTMENT TUBE: 1.0000 "application " | TOPICAL_OINTMENT | Freq: Three times a day (TID) | CUTANEOUS | 0 refills | Status: DC

## 2016-08-19 MED ORDER — PREDNISONE 10 MG PO TABS: ORAL_TABLET | ORAL | 0 refills | Status: DC

## 2016-08-19 NOTE — Consult Note (Signed)
[facility] Face-to-Face Psychiatry Consult   Reason for Consult:  Polysubstance abuse, delusions and hallucinations Referring Physician:  Dr. Andre Lefort Dr. Rito Ehrlich Patient Identification: Ariana Jensen MRN:  [id number] Principal Diagnosis: Polysubstance abuse Diagnosis:   Patient Active Problem List   Diagnosis Date Noted  . Acute kidney injury (HCC) [N17.9] 08/18/2016  . Boils [L02.92] 08/18/2016  . Rhabdomyolysis [M62.82] 08/18/2016  . Urinary retention [R33.9] 08/18/2016  . ARF (acute renal failure) (HCC) [N17.9] 08/18/2016  . AKI (acute kidney injury) (HCC) [N17.9] 08/17/2016  . Hallucination [R44.3] 08/17/2016  . Nausea & vomiting [R11.2] 10/04/2015  . Pruritic dermatitis [L29.9] 09/10/2015  . Rash and nonspecific skin eruption [R21] 08/21/2015  . Body lice [B85.1] 08/05/2015  . Prolonged QT interval [R94.31] 07/18/2015  . Dysmenorrhea [N94.6] 04/10/2015  . Trichimoniasis [A59.9] 04/10/2015  . Chest pain [R07.9] 03/07/2015  . Healthcare maintenance [Z00.00] 03/07/2015  . RLQ abdominal pain [R10.31] 02/18/2012  . GERD (gastroesophageal reflux disease) [K21.9] 01/28/2012  . Menorrhagia [N92.0] 01/25/2012  . Weakness generalized [R53.1] 12/05/2011  . Tobacco abuse [Z72.0] 11/06/2011  . HTN (hypertension) [I10] 10/22/2011  . Abscess and cellulitis [L03.90, L02.91] 10/22/2011  . Hepatitis C, chronic (HCC) [B18.2] 10/22/2011  . History of TB (tuberculosis) [Z86.11] 10/22/2011  . Opioid dependence (HCC) [F11.20]   . Polysubstance abuse [F19.10] 04/17/2011    Total Time spent with patient: 1 hour  Subjective:   Ariana Jensen is a 50 y.o. female patient admitted with hallucinations and bizarre delusions.  HPI:  Ariana Jensen a 50 y.o.femaleSeen, chart reviewed for this face-to-face psychiatric consultation and evaluation of recent bizarre delusions and acute hallucinations. Patient appeared sitting in her bed, calm, cooperative and pleasant during this visit. Patient  reported vague abdominal pain and some chronic skin infection secondary to scabies. Patient also endorses polysubstance abuse and recent snorting cocaine along with her associates in her neighborhood. Patient reported she has been snorting cocaine twice a month for several years but does not need to come to the treatment before. Patient also known for opioid dependence and receiving Methadone Treatment from [facility] methadone clinic. Patient denies current symptoms of depression, anxiety, mania, suicidal/homicidal ideation, intention or plans. Patient has no auditory/visual or tactile hallucinations during this evaluation. Patient stated she has a friend who has been supportive to her. Review of labs indicated urine drug screens positive for cocaine.  Medical history: Patient with medical history significant of hypertension, GERD, TB, polysubstance abuse (tobacco, opioid dependence, cocaine), HCV antibody positive, dCHF, chronic skin boils, who presents with hallucination and urinary retention. UA shows trace leukocytes. Bladder scan was . Foley cath now in place. UDS is positive for cocaine. Patient is seen daily at a methadone clinic for the past seven years. Of note, patient brought a bottle of dishwashing liquid with her,stating that there are objects living in the dishwashing liquid which are invading her body. Patient also has AKI. Cre 1.38 yesterday, now 1.11. CK 949, repeat CK 894.  Past Psychiatric History: Polysubstance abuse and no [facility] admissions.  Risk to Self: Is patient at risk for suicide?: No Risk to Others:   Prior Inpatient Therapy:   Prior Outpatient Therapy:    Past Medical History:  Past Medical History:  Diagnosis Date  . Anxiety   . ASCUS (atypical squamous cells of undetermined significance) on Pap smear   . Chronic lower back pain   . Cocaine abuse    crack cocaine  . Depression   . Fibromyalgia   . GERD (gastroesophageal reflux disease)   .  Hepatitis C   .  Hepatitis C antibody test positive   . Hypertension   . Opioid dependence ([place])   . Seizures ([place])    "don't know why; I've had 2 in the past year" (08/18/2016)  . Trichomonas 04/17/2011   Diagnosed 04/02/11 during hospitalization, treated with Flagyl 2g, patient instructed to have partner treated (must follow-up)   . Tuberculosis    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 age 51, now s/p 1-yr of multidrug treatment (dates unknown)    Past Surgical History:  Procedure Laterality Date  . CESAREAN SECTION  1984  . DILATION AND CURETTAGE OF UTERUS    . I&D EXTREMITY  10/18/2011   Procedure: IRRIGATION AND DEBRIDEMENT EXTREMITY;  Surgeon: Linna Hoff, MD;  Location: [place];  Service: Orthopedics;  Laterality: Left;  . I&D EXTREMITY  10/21/2011   Procedure: IRRIGATION AND DEBRIDEMENT EXTREMITY;  Surgeon: Linna Hoff, MD;  Location: [place];  Service: Orthopedics;  Laterality: Left;  . SALPINGECTOMY Left March 2010   ectopic pregnancy   Family History:  Family History  Problem Relation Age of Onset  . Heart attack Mother   . Heart attack Father   . Heart attack Paternal Grandmother   . Heart attack Paternal Grandfather   . Cirrhosis Brother    Family Psychiatric  History: Noncontributory Social History:  History  Alcohol Use  . 0.0 oz/week    Comment: 08/18/2016 "I'll drink on holidays"     History  Drug Use  . Types: Oxycodone, Heroin, Cocaine    Comment: 08/18/2016 "ex-heroin addict; clean; last snorted cocaine 2 wks ago    Social History   Social History  . Marital status: Widowed    Spouse name: N/A  . Number of children: 1  . Years of education: 7th grade   Social History Main Topics  . Smoking status: Current Every Day Smoker    Packs/day: 1.00    Years: 32.00    Types: Cigarettes  . Smokeless tobacco: Never Used  . Alcohol use 0.0 oz/week     Comment: 08/18/2016 "I'll drink on holidays"  . Drug use:     Types: Oxycodone, Heroin, Cocaine     Comment:  08/18/2016 "ex-heroin addict; clean; last snorted cocaine 2 wks ago  . Sexual activity: Not Currently    Birth control/ protection: None   Other Topics Concern  . None   Social History Narrative   Lives in [place]   Currently Unemployeed   Additional Social History:    Allergies:  No Known Allergies  Labs:  Results for orders placed or performed during the hospital encounter of 08/17/16 (from the past 48 hour(s))  Ethanol     Status: None   Collection Time: 08/17/16  7:24 PM  Result Value Ref Range   Alcohol, Ethyl (B) <5 <5 mg/dL    Comment:        LOWEST DETECTABLE LIMIT FOR SERUM ALCOHOL IS 5 mg/dL FOR MEDICAL PURPOSES ONLY   Rapid urine drug screen (hospital performed)     Status: Abnormal   Collection Time: 08/17/16  7:24 PM  Result Value Ref Range   Opiates NONE DETECTED NONE DETECTED   Cocaine POSITIVE (A) NONE DETECTED   Benzodiazepines NONE DETECTED NONE DETECTED   Amphetamines NONE DETECTED NONE DETECTED   Tetrahydrocannabinol NONE DETECTED NONE DETECTED   Barbiturates NONE DETECTED NONE DETECTED    Comment:        DRUG SCREEN FOR MEDICAL PURPOSES ONLY.  IF CONFIRMATION IS NEEDED  FOR ANY PURPOSE, NOTIFY LAB WITHIN 5 DAYS.        LOWEST DETECTABLE LIMITS FOR URINE DRUG SCREEN Drug Class       Cutoff (ng/mL) Amphetamine      1000 Barbiturate      200 Benzodiazepine   539 Tricyclics       767 Opiates          300 Cocaine          300 THC              50   Urinalysis, Routine w reflex microscopic (not at [facility])     Status: Abnormal   Collection Time: 08/17/16  7:24 PM  Result Value Ref Range   Color, Urine STRAW (A) YELLOW   APPearance CLOUDY (A) CLEAR   Specific Gravity, Urine 1.015 1.005 - 1.030   pH 5.5 5.0 - 8.0   Glucose, UA NEGATIVE NEGATIVE mg/dL   Hgb urine dipstick NEGATIVE NEGATIVE   Bilirubin Urine NEGATIVE NEGATIVE   Ketones, ur NEGATIVE NEGATIVE mg/dL   Protein, ur NEGATIVE NEGATIVE mg/dL   Nitrite NEGATIVE NEGATIVE   Leukocytes,  UA TRACE (A) NEGATIVE  CBC with Differential     Status: Abnormal   Collection Time: 08/17/16  7:24 PM  Result Value Ref Range   WBC 10.4 4.0 - 10.5 K/uL   RBC 3.98 3.87 - 5.11 MIL/uL   Hemoglobin 11.6 (L) 12.0 - 15.0 g/dL   HCT 35.5 (L) 36.0 - 46.0 %   MCV 89.2 78.0 - 100.0 fL   MCH 29.1 26.0 - 34.0 pg   MCHC 32.7 30.0 - 36.0 g/dL   RDW 15.4 11.5 - 15.5 %   Platelets 264 150 - 400 K/uL   Neutrophils Relative % 74 %   Neutro Abs 7.6 1.7 - 7.7 K/uL   Lymphocytes Relative 19 %   Lymphs Abs 2.0 0.7 - 4.0 K/uL   Monocytes Relative 6 %   Monocytes Absolute 0.7 0.1 - 1.0 K/uL   Eosinophils Relative 1 %   Eosinophils Absolute 0.1 0.0 - 0.7 K/uL   Basophils Relative 0 %   Basophils Absolute 0.0 0.0 - 0.1 K/uL  Comprehensive metabolic panel     Status: Abnormal   Collection Time: 08/17/16  7:24 PM  Result Value Ref Range   Sodium 135 135 - 145 mmol/L   Potassium 3.5 3.5 - 5.1 mmol/L   Chloride 100 (L) 101 - 111 mmol/L   CO2 24 22 - 32 mmol/L   Glucose, Bld 79 65 - 99 mg/dL   BUN 24 (H) 6 - 20 mg/dL   Creatinine, Ser 1.38 (H) 0.44 - 1.00 mg/dL   Calcium 8.9 8.9 - 10.3 mg/dL   Total Protein 7.3 6.5 - 8.1 g/dL   Albumin 3.7 3.5 - 5.0 g/dL   AST 31 15 - 41 U/L   ALT 20 14 - 54 U/L   Alkaline Phosphatase 77 38 - 126 U/L   Total Bilirubin 0.7 0.3 - 1.2 mg/dL   GFR calc non Af Amer 44 (L) >60 mL/min   GFR calc Af Amer 51 (L) >60 mL/min    Comment: (NOTE) The eGFR has been calculated using the CKD EPI equation. This calculation has not been validated in all clinical situations. eGFR's persistently <60 mL/min signify possible Chronic Kidney Disease.    Anion gap 11 5 - 15  Urine microscopic-add on     Status: Abnormal   Collection Time: 08/17/16  7:24 PM  Result  Value Ref Range   Squamous Epithelial / LPF 6-30 (A) NONE SEEN   WBC, UA 6-30 0 - 5 WBC/hpf   RBC / HPF NONE SEEN 0 - 5 RBC/hpf   Bacteria, UA MANY (A) NONE SEEN   Casts HYALINE CASTS (A) NEGATIVE  Brain natriuretic  peptide     Status: None   Collection Time: 08/17/16  7:24 PM  Result Value Ref Range   B Natriuretic Peptide 8.1 0.0 - 100.0 pg/mL  hCG, quantitative, pregnancy     Status: Abnormal   Collection Time: 08/17/16  7:24 PM  Result Value Ref Range   hCG, Beta Chain, Quant, S 6 (H) <5 mIU/mL    Comment:          GEST. AGE      CONC.  (mIU/mL)   <=1 WEEK        5 - 50     2 WEEKS       50 - 500     3 WEEKS       100 - 10,000     4 WEEKS     1,000 - 30,000     5 WEEKS     3,500 - 115,000   6-8 WEEKS     12,000 - 270,000    12 WEEKS     15,000 - 220,000        FEMALE AND NON-PREGNANT FEMALE:     LESS THAN 5 mIU/mL   Creatinine, urine, random     Status: None   Collection Time: 08/17/16  7:24 PM  Result Value Ref Range   Creatinine, Urine 133.06 mg/dL  Lipase, blood     Status: None   Collection Time: 08/17/16  7:24 PM  Result Value Ref Range   Lipase 21 11 - 51 U/L  Urine culture     Status: Abnormal   Collection Time: 08/17/16  7:24 PM  Result Value Ref Range   Specimen Description URINE, RANDOM    Special Requests NONE    Culture MULTIPLE SPECIES PRESENT, SUGGEST RECOLLECTION (A)    Report Status 08/19/2016 FINAL   Rapid HIV screen (HIV 1/2 Ab+Ag)     Status: None   Collection Time: 08/17/16 10:12 PM  Result Value Ref Range   HIV-1 P24 Antigen - HIV24 NON REACTIVE NON REACTIVE   HIV 1/2 Antibodies NON REACTIVE NON REACTIVE   Interpretation (HIV Ag Ab)      A non reactive test result means that HIV 1 or HIV 2 antibodies and HIV 1 p24 antigen were not detected in the specimen.  RPR     Status: None   Collection Time: 08/17/16 10:12 PM  Result Value Ref Range   RPR Ser Ql Non Reactive Non Reactive    Comment: (NOTE) Performed At: [facility] [address] [place], [place] [id number] Lindon Romp MD [id number]   CK     Status: Abnormal   Collection Time: 08/17/16 10:12 PM  Result Value Ref Range   Total CK 949 (H) 38 - 234 U/L  Urea nitrogen, urine      Status: None   Collection Time: 08/18/16 12:19 AM  Result Value Ref Range   Urea Nitrogen, Ur 428 Not Estab. mg/dL    Comment: (NOTE) Performed At: [facility] [place], [place] [id number] Lindon Romp MD [id number]   CK     Status: Abnormal   Collection Time: 08/18/16  2:14 AM  Result Value Ref Range  Total CK 894 (H) 38 - 234 U/L  Basic metabolic panel     Status: Abnormal   Collection Time: 08/18/16  2:14 AM  Result Value Ref Range   Sodium 139 135 - 145 mmol/L   Potassium 3.5 3.5 - 5.1 mmol/L   Chloride 108 101 - 111 mmol/L   CO2 23 22 - 32 mmol/L   Glucose, Bld 101 (H) 65 - 99 mg/dL   BUN 20 6 - 20 mg/dL   Creatinine, Ser 1.11 (H) 0.44 - 1.00 mg/dL   Calcium 7.8 (L) 8.9 - 10.3 mg/dL   GFR calc non Af Amer 58 (L) >60 mL/min   GFR calc Af Amer >60 >60 mL/min    Comment: (NOTE) The eGFR has been calculated using the CKD EPI equation. This calculation has not been validated in all clinical situations. eGFR's persistently <60 mL/min signify possible Chronic Kidney Disease.    Anion gap 8 5 - 15  CBC     Status: Abnormal   Collection Time: 08/18/16  2:14 AM  Result Value Ref Range   WBC 7.4 4.0 - 10.5 K/uL   RBC 3.73 (L) 3.87 - 5.11 MIL/uL   Hemoglobin 11.0 (L) 12.0 - 15.0 g/dL   HCT 33.7 (L) 36.0 - 46.0 %   MCV 90.3 78.0 - 100.0 fL   MCH 29.5 26.0 - 34.0 pg   MCHC 32.6 30.0 - 36.0 g/dL   RDW 15.6 (H) 11.5 - 15.5 %   Platelets 215 150 - 400 K/uL  CK     Status: Abnormal   Collection Time: 08/19/16  9:17 AM  Result Value Ref Range   Total CK 399 (H) 38 - 234 U/L  CBC     Status: Abnormal   Collection Time: 08/19/16  9:17 AM  Result Value Ref Range   WBC 6.3 4.0 - 10.5 K/uL   RBC 3.79 (L) 3.87 - 5.11 MIL/uL   Hemoglobin 10.9 (L) 12.0 - 15.0 g/dL   HCT 34.8 (L) 36.0 - 46.0 %   MCV 91.8 78.0 - 100.0 fL   MCH 28.8 26.0 - 34.0 pg   MCHC 31.3 30.0 - 36.0 g/dL   RDW 15.7 (H) 11.5 - 15.5 %   Platelets 200 150 - 400 K/uL  Comprehensive  metabolic panel     Status: Abnormal   Collection Time: 08/19/16  9:17 AM  Result Value Ref Range   Sodium 138 135 - 145 mmol/L   Potassium 4.6 3.5 - 5.1 mmol/L   Chloride 110 101 - 111 mmol/L   CO2 23 22 - 32 mmol/L   Glucose, Bld 74 65 - 99 mg/dL   BUN 17 6 - 20 mg/dL   Creatinine, Ser 0.64 0.44 - 1.00 mg/dL   Calcium 8.9 8.9 - 10.3 mg/dL   Total Protein 6.3 (L) 6.5 - 8.1 g/dL   Albumin 3.2 (L) 3.5 - 5.0 g/dL   AST 35 15 - 41 U/L   ALT 22 14 - 54 U/L   Alkaline Phosphatase 65 38 - 126 U/L   Total Bilirubin 0.5 0.3 - 1.2 mg/dL   GFR calc non Af Amer >60 >60 mL/min   GFR calc Af Amer >60 >60 mL/min    Comment: (NOTE) The eGFR has been calculated using the CKD EPI equation. This calculation has not been validated in all clinical situations. eGFR's persistently <60 mL/min signify possible Chronic Kidney Disease.    Anion gap 5 5 - 15    Current Facility-Administered Medications  Medication Dose Route Frequency Provider Last Rate Last Dose  . 0.9 %  sodium chloride infusion   Intravenous Continuous Ivor Costa, MD   Stopped at 08/18/16 1750  . albuterol (PROVENTIL) (2.5 MG/3ML) 0.083% nebulizer solution 2.5 mg  2.5 mg Nebulization Q4H PRN Ivor Costa, MD      . alum & mag hydroxide-simeth (MAALOX/MYLANTA) 200-200-20 MG/5ML suspension 30 mL  30 mL Oral Q6H PRN Ivor Costa, MD      . amLODipine (NORVASC) tablet 10 mg  10 mg Oral Daily Ivor Costa, MD   10 mg at 08/19/16 0903  . camphor-menthol (SARNA) lotion 1 application  1 application Topical PRN Ivor Costa, MD      . cloNIDine (CATAPRES) tablet 0.1 mg  0.1 mg Oral QHS Ivor Costa, MD   0.1 mg at 08/18/16 2152  . cyclobenzaprine (FLEXERIL) tablet 10 mg  10 mg Oral Daily Ivor Costa, MD   10 mg at 08/19/16 0903  . enoxaparin (LOVENOX) injection 40 mg  40 mg Subcutaneous Daily Ivor Costa, MD      . ferrous sulfate tablet 325 mg  325 mg Oral Q breakfast Ivor Costa, MD   325 mg at 08/19/16 0839  . gabapentin (NEURONTIN) capsule 300 mg  300 mg Oral  BID Ivor Costa, MD   300 mg at 08/19/16 0903  . hydrALAZINE (APRESOLINE) injection 5 mg  5 mg Intravenous Q2H PRN Ivor Costa, MD      . hydrOXYzine (ATARAX/VISTARIL) tablet 25 mg  25 mg Oral Q6H PRN Ivor Costa, MD   25 mg at 08/19/16 0938  . loratadine (CLARITIN) tablet 10 mg  10 mg Oral Daily Ivor Costa, MD   10 mg at 08/19/16 0903  . methadone (DOLOPHINE) tablet 80 mg  80 mg Oral Q breakfast Ivor Costa, MD   80 mg at 08/19/16 0903  . methocarbamol (ROBAXIN) tablet 500 mg  500 mg Oral BID PRN Ivor Costa, MD   500 mg at 08/18/16 0720  . montelukast (SINGULAIR) tablet 10 mg  10 mg Oral QHS Ivor Costa, MD   10 mg at 08/18/16 2152  . neomycin-bacitracin-polymyxin (NEOSPORIN) ointment   Topical TID Verlee Monte, MD   1 application at 95/09/32 0906  . nicotine (NICODERM CQ - dosed in mg/24 hours) patch 21 mg  21 mg Transdermal Daily Ivor Costa, MD   21 mg at 08/19/16 0844  . pneumococcal 23 valent vaccine (PNU-IMMUNE) injection 0.5 mL  0.5 mL Intramuscular Tomorrow-1000 Verlee Monte, MD      . polyethylene glycol (MIRALAX / GLYCOLAX) packet 17-34 g  17-34 g Oral BID PRN Ivor Costa, MD      . 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 No Fever-chills, No Headache, No changes with Vision or hearing, reports vertigo No problems swallowing food or Liquids, No Chest pain, Cough or Shortness of Breath, No Abdominal pain, No Nausea or Vommitting, Bowel movements are regular, No Blood in stool or Urine, No dysuria, No new skin rashes or bruises, No new joints pains-aches,  No new weakness, tingling, numbness in any extremity, No recent weight  gain or loss,  No polyuria, polydypsia or polyphagia,   A full 10 point Review of Systems was done, except as stated above, all other Review of Systems were negative.  Blood pressure (!) 144/92, pulse 68, temperature 97.8 F (36.6 C), temperature source Oral, resp. rate 19, height '5\' 3"'$  (1.6 m), weight 66.4 kg (146 lb 6.4 oz), last menstrual period 10/23/2015, SpO2 99 %.Body mass index is 25.93 kg/m.  General Appearance: Casual  Eye Contact:  Good  Speech:  Clear and Coherent  Volume:  Normal  Mood:  Euthymic  Affect:  Appropriate and Congruent  Thought Process:  Coherent and Goal Directed  Orientation:  Full (Time, Place, and Person)  Thought Content:  WDL  Suicidal Thoughts:  No  Homicidal Thoughts:  No  Memory:  Immediate;   Good Recent;   Fair Remote;   Fair  Judgement:  Impaired  Insight:  Fair  Psychomotor Activity:  Decreased  Concentration:  Concentration: Good and Attention Span: Good  Recall:  Good  Fund of Knowledge:  Good  Language:  Good  Akathisia:  Negative  Handed:  Right  AIMS (if indicated):     Assets:  Communication Skills Desire for Improvement Housing Leisure Time Resilience Social Support Transportation  ADL's:  Intact  Cognition:  WNL  Sleep:        Treatment Plan Summary: 50 years old female admitted with multiple medical problems and cocaine intoxication with cocaine-induced hallucinations/delusions. Patient has been free from cocaine-induced psychosis during my visit. Patient denies current symptoms of depression, anxiety, mania, psychosis and also denied suicidal/homicidal ideation.   Patient has no safety concerns and has intact cognitions Patient is psychiatrically cleared for the discharge  Cocaine-induced psychosis (tactile hallucinations versus delusions) which were resolved Cocaine abuse: Counseled to stay sober Appreciate psychiatric consultation and we sign off as of today Please contact [phone] or [phone] if needs further  assistance  Disposition: Patient will be referred to outpatient medication management at [facility] methadone clinic No evidence of imminent risk to self or others at present.   Supportive therapy provided about ongoing stressors.  Ambrose Finland, MD 08/19/2016 1:10 PM

## 2016-08-19 NOTE — Care Management Note (Signed)
Case Management Note  Patient Details  Name: Ariana Jensen MRN: [id number] Date of Birth: 05/30/1966  Subjective/Objective:                 Patient admitted from home, lives alone. Admitted with AKI, rhabdo, and hallucinations. Admits to cocaine, oxy, ETOH, tested +Cocaine on admission. DC pending psych eval. MD verified consult requested.  PCP [place]   Action/Plan:  Anticipate DC to home self care.  Expected Discharge Date:                  Expected Discharge Plan:  Home/Self Care  In-House Referral:  Clinical Social Work  Discharge planning Services  CM Consult  Post Acute Care Choice:    Choice offered to:     DME Arranged:    DME Agency:     HH Arranged:    HH Agency:     Status of Service:  Completed, signed off  If discussed at [employer] Meetings, dates discussed:    Additional Comments:  Lawerance SabalDebbie Birt Reinoso, RN 08/19/2016, 11:36 AM

## 2016-08-19 NOTE — Progress Notes (Signed)
Patient voided x 1 after Foley was removed. Will continue to monitor.

## 2016-08-19 NOTE — Progress Notes (Signed)
CSW consulted for transportation needs- no one can pick up patient until tomorrow.  Pt unable to afford taxi back home- CSW approved taxi voucher through supervisor and confirmed pt would have way to get into the home upon arrival.  CSW signing off  Burna SisJenna H. Jyllian Haynie, LCSW Clinical Social Worker [phone]

## 2016-08-19 NOTE — Progress Notes (Signed)
Patient was discharged home by MD order; discharged instructions review and give to patient with care notes and prescriptions; IV DIC; home medication gave back from pharmacy and all her belongings from security; patient will be escorted to the car by nurse tech via wheelchair.

## 2016-08-19 NOTE — Discharge Summary (Signed)
[facility]  Physician Discharge Summary   Patient ID: Ariana Jensen MRN: [id number] DOB/AGE: [birth year] 49 y.o.  Admit date: 08/17/2016 Discharge date: 08/19/2016  PCP: [facility]  DISCHARGE DIAGNOSES:  Principal Problem:   Polysubstance abuse Active Problems:   Opioid dependence (HCC)   HTN (hypertension)   Tobacco abuse   GERD (gastroesophageal reflux disease)   Prolonged QT interval   Nausea & vomiting   AKI (acute kidney injury) (HCC)   Hallucination   Acute kidney injury (HCC)   Boils   Rhabdomyolysis   Urinary retention   ARF (acute renal failure) (HCC)   RECOMMENDATIONS FOR OUTPATIENT FOLLOW UP: 1. Follow-up with PCP for further issues with nausea and problems urinating  DISCHARGE CONDITION: fair  Diet recommendation: As before  Filed Weights   08/17/16 1917 08/18/16 1455 08/19/16 0625  Weight: 66.2 kg (146 lb) 65.5 kg (144 lb 6.4 oz) 66.4 kg (146 lb 6.4 oz)    INITIAL HISTORY: Ariana Locklearis a 50 y.o.femalewith medical history significant of hypertension, GERD, TB, polysubstance abuse (tobacco, opioid dependence, cocaine), HCV antibody positive, dCHF, chronic skin boils, who presented with hallucinations and urinary retention. UDS is positive for cocaine. Patient is seen daily at a methadone clinic for the past seven years. Of note, patient brought a bottle of dishwashing liquid with her,stating that there are objects living in the dishwashing liquid which are invading her body. Patient also has AKI. And rhabdomyolysis.  Consultations:  Psychiatry  Procedures: None  HOSPITAL COURSE:   AKI (acute kidney injury)  Patient's renal failure was thought to be due to a combination of urinary retention and dehydration. She also had mild rhabdomyolysis with CK up to 949. Patient was given IV fluids. Her diuretics were held. Her NSAIDs were held. Renal function is now back to normal. CK is down to  399.  Polysubstance abuse:  Including tobacco, cocaine and opioid dependence. Seen by psychiatry. Counseled on cessation.  Hallucination Etiology is not clear. May be due to cocaine abuse. Psychiatry was consulted. Cleared by them.  Opioid dependence Continue treatment at methadone clinic  Essential hypertension Okay to resume home medications. Would avoid multiple diuretics.  GERD (gastroesophageal reflux disease):  Pt is on Zantac and prn Maalox  Nausea & vomiting: Hasn't had any further episodes of vomiting in the hospital. Abdomen is benign. She did some pain in the left lower quadrant. This morning, which started resolving on its own. She tolerated her lunch.  Chronic skin boils: Apply bacitracin to affected areas. Oral steroids was recently prescribed by her PCP. We will give her prescription for additional tablets per her request as it seemed to help. Apparently has been referred to dermatology.  Urinary retention: Probably drug related. Foley catheter removed this morning. She has voided without difficulty.  Patient very keen on going home. She will undergo home this morning. Now that she has been cleared by psychiatry, she states that she has transportation issues. We will request social worker to assist. Medically clear for discharge.   PERTINENT LABS:  The results of significant diagnostics from this hospitalization (including imaging, microbiology, ancillary and laboratory) are listed below for reference.    Microbiology: Recent Results (from the past 240 hour(s))  Urine culture     Status: Abnormal   Collection Time: 08/17/16  7:24 PM  Result Value Ref Range Status   Specimen Description URINE, RANDOM  Final   Special Requests NONE  Final   Culture MULTIPLE SPECIES PRESENT, SUGGEST RECOLLECTION (A)  Final  Report Status 08/19/2016 FINAL  Final     Labs: Basic Metabolic Panel:  Recent Labs Lab 08/17/16 1924 08/18/16 0214 08/19/16 0917  NA 135  139 138  K 3.5 3.5 4.6  CL 100* 108 110  CO2 24 23 23   GLUCOSE 79 101* 74  BUN 24* 20 17  CREATININE 1.38* 1.11* 0.64  CALCIUM 8.9 7.8* 8.9   Liver Function Tests:  Recent Labs Lab 08/17/16 1924 08/19/16 0917  AST 31 35  ALT 20 22  ALKPHOS 77 65  BILITOT 0.7 0.5  PROT 7.3 6.3*  ALBUMIN 3.7 3.2*    Recent Labs Lab 08/17/16 1924  LIPASE 21   CBC:  Recent Labs Lab 08/17/16 1924 08/18/16 0214 08/19/16 0917  WBC 10.4 7.4 6.3  NEUTROABS 7.6  --   --   HGB 11.6* 11.0* 10.9*  HCT 35.5* 33.7* 34.8*  MCV 89.2 90.3 91.8  PLT 264 215 200   Cardiac Enzymes:  Recent Labs Lab 08/17/16 2212 08/18/16 0214 08/19/16 0917  CKTOTAL 949* 894* 399*   BNP: BNP (last 3 results)  Recent Labs  08/17/16 1924  BNP 8.1    IMAGING STUDIES No results found.  DISCHARGE EXAMINATION: Vitals:   08/19/16 0625 08/19/16 0732 08/19/16 1135 08/19/16 1432  BP: (!) 147/79 (!) 165/92 (!) 144/92 120/70  Pulse: 69 74 68 77  Resp: 18 19    Temp: 97.8 F (36.6 C) 98.4 F (36.9 C) 97.8 F (36.6 C) 98.3 F (36.8 C)  TempSrc: Oral Oral Oral Oral  SpO2: 99% 98% 99% 93%  Weight: 66.4 kg (146 lb 6.4 oz)     Height:       General appearance: alert, cooperative, appears stated age and no distress Resp: clear to auscultation bilaterally Cardio: regular rate and rhythm, S1, S2 normal, no murmur, click, rub or gallop GI: soft, non-tender; bowel sounds normal; no masses,  no organomegaly Extremities: extremities normal, atraumatic, no cyanosis or edema  DISPOSITION: Home  Discharge Instructions    Call MD for:  difficulty breathing, headache or visual disturbances    Complete by:  As directed    Call MD for:  extreme fatigue    Complete by:  As directed    Call MD for:  persistant dizziness or light-headedness    Complete by:  As directed    Call MD for:  persistant nausea and vomiting    Complete by:  As directed    Call MD for:  severe uncontrolled pain    Complete by:  As  directed    Call MD for:  temperature >100.4    Complete by:  As directed    Diet - low sodium heart healthy    Complete by:  As directed    Discharge instructions    Complete by:  As directed    Please be sure to follow-up with her primary care provider within a week. Seek attention if your abdominal pain recurs or if you have difficulty urinating.  You were cared for by a hospitalist during your hospital stay. If you have any questions about your discharge medications or the care you received while you were in the hospital after you are discharged, you can call the unit and asked to speak with the hospitalist on call if the hospitalist that took care of you is not available. Once you are discharged, your primary care physician will handle any further medical issues. Please note that NO REFILLS for any discharge medications will be authorized once  you are discharged, as it is imperative that you return to your primary care physician (or establish a relationship with a primary care physician if you do not have one) for your aftercare needs so that they can reassess your need for medications and monitor your lab values. If you do not have a primary care physician, you can call [phone] for a physician referral.   Increase activity slowly    Complete by:  As directed       ALLERGIES: No Known Allergies   Current Discharge Medication List    START taking these medications   Details  neomycin-bacitracin-polymyxin (NEOSPORIN) OINT Apply 1 application topically 3 (three) times daily. Qty: 1 g, Refills: 0    nicotine (NICODERM CQ - DOSED IN MG/24 HOURS) 21 mg/24hr patch Place 1 patch (21 mg total) onto the skin daily. Qty: 28 patch, Refills: 0      CONTINUE these medications which have CHANGED   Details  predniSONE (DELTASONE) 10 MG tablet Take 2 tabs once daily for 5 days, then Take 1 tab once daily for 5 days, then STOP. Qty: 15 tablet, Refills: 0      CONTINUE these medications which  have NOT CHANGED   Details  amLODipine (NORVASC) 10 MG tablet Take 1 tablet (10 mg total) by mouth daily. Qty: 90 tablet, Refills: 3    camphor-menthol (SARNA) lotion Apply 1 application topically as needed for itching. Qty: 222 mL, Refills: 0    cetirizine (ZYRTEC) 10 MG tablet Take 1 tablet (10 mg total) by mouth daily. Qty: 30 tablet, Refills: 11    cloNIDine (CATAPRES) 0.1 MG tablet Take 0.1 mg by mouth at bedtime.    cyclobenzaprine (FLEXERIL) 10 MG tablet Take 10 mg by mouth daily.    ferrous sulfate 325 (65 FE) MG tablet Take 325 mg by mouth daily with breakfast.    gabapentin (NEURONTIN) 300 MG capsule Take 300 mg by mouth 2 (two) times daily.    hydrochlorothiazide (HYDRODIURIL) 25 MG tablet Take 1 tablet (25 mg total) by mouth daily. Qty: 90 tablet, Refills: 0    hydrOXYzine (ATARAX/VISTARIL) 25 MG tablet Take 1 tablet (25 mg total) by mouth every 8 (eight) hours as needed for itching (for severe itching.). Qty: 30 tablet, Refills: 2   Associated Diagnoses: Pruritic dermatitis    lisinopril (PRINIVIL,ZESTRIL) 20 MG tablet Take 1 tablet (20 mg total) by mouth daily. Qty: 90 tablet, Refills: 0   Associated Diagnoses: Essential hypertension    methadone (DOLOPHINE) 5 MG/5ML solution Take 80 mg by mouth every morning.     methocarbamol (ROBAXIN) 500 MG tablet Take 1 tablet (500 mg total) by mouth 2 (two) times daily. Qty: 20 tablet, Refills: 0    montelukast (SINGULAIR) 10 MG tablet Take 10 mg by mouth at bedtime.    polyethylene glycol powder (GLYCOLAX/MIRALAX) powder Take 34 g by mouth 2 (two) times daily as needed. Take until you have a bowel movement. Qty: 3350 g, Refills: 1   Associated Diagnoses: Constipation due to opioid therapy    ranitidine (ZANTAC) 150 MG tablet Take 150 mg by mouth daily.    triamcinolone cream (KENALOG) 0.1 % Apply 1 application topically 2 (two) times daily. Qty: 30 g, Refills: 0      STOP taking these medications     furosemide  (LASIX) 20 MG tablet      ibuprofen (ADVIL,MOTRIN) 200 MG tablet          Follow-up Information    Jacquiline Doealeb Parker, MD.  Schedule an appointment as soon as possible for a visit in 1 week(s).   Specialty:  Family Medicine Contact information: [address] [place] [address] [phone]           TOTAL DISCHARGE TIME: 35 minutes  [facility] HospitalKRISHNAN,Daylan Juhnke  [facility] Pager [phone]  08/19/2016, 5:25 PM

## 2016-11-09 ENCOUNTER — Ambulatory Visit: Payer: Self-pay | Admitting: Family Medicine

## 2016-11-20 ENCOUNTER — Telehealth: Payer: Self-pay | Admitting: Licensed Clinical Social Worker

## 2016-11-20 NOTE — Telephone Encounter (Signed)
Message received from the patient requesting a call back.   LCSWA called patient and left message for a return call.

## 2016-11-26 ENCOUNTER — Encounter (HOSPITAL_COMMUNITY): Payer: Self-pay | Admitting: Emergency Medicine

## 2016-11-26 ENCOUNTER — Emergency Department (HOSPITAL_COMMUNITY)
Admission: EM | Admit: 2016-11-26 | Discharge: 2016-11-26 | Disposition: A | Discharge status: Home / Self Care | Payer: No Typology Code available for payment source | Attending: Emergency Medicine | Admitting: Emergency Medicine

## 2016-11-26 ENCOUNTER — Ambulatory Visit: Payer: Self-pay

## 2016-11-26 DIAGNOSIS — F17210 Nicotine dependence, cigarettes, uncomplicated: Secondary | ICD-10-CM | POA: Insufficient documentation

## 2016-11-26 DIAGNOSIS — M79641 Pain in right hand: Secondary | ICD-10-CM | POA: Insufficient documentation

## 2016-11-26 DIAGNOSIS — Z79899 Other long term (current) drug therapy: Secondary | ICD-10-CM | POA: Insufficient documentation

## 2016-11-26 DIAGNOSIS — I10 Essential (primary) hypertension: Secondary | ICD-10-CM | POA: Insufficient documentation

## 2016-11-26 MED ORDER — NAPROXEN 500 MG PO TABS: 500.0000 mg | ORAL_TABLET | Freq: Two times a day (BID) | ORAL | 0 refills | Status: DC

## 2016-11-26 NOTE — Discharge Instructions (Signed)
Please follow-up with your primary care provider within one week regarding today's visit. Use cold compresses area. Take ibuprofen or naproxen as needed for pain. Please return immediately if he has orange color in urination, fevers, chills, changes in bowel movements.

## 2016-11-26 NOTE — ED Provider Notes (Signed)
[facility]-EMERGENCY DEPT Provider Note   CSN: [id number] Arrival date & time: 11/26/16  1140  By signing my name below, I, Ariana Jensen, attest that this documentation has been prepared under the direction and in the presence of [address] [place], [place]. Electronically Signed: Sonum Jensen, Neurosurgeon. 11/26/16. 1:22 PM.  History   Chief Complaint Chief Complaint  Patient presents with  . Arm Pain    The history is provided by the patient. No language interpreter was used.     HPI Comments: Ariana Jensen is a 51 y.o. female with hx of polysubstance abuse and rhabdomyolysis who presents to the Emergency Department complaining of persistent right arm and BLE pain that began 2 weeks ago. She notes associated intermittent right hand paresthesia and mild swelling. She rates her pain as 5/10, 8/10 at worst. She has tried ibuprofen with mild relief. She denies known injury or trauma to the affected area. She denies fever, chills, nausea, vomiting, diarrhea, changes in bowel or bladder function. She denies history of arthritis or carpal tunnel.   Past Medical History:  Diagnosis Date  . Anxiety   . ASCUS (atypical squamous cells of undetermined significance) on Pap smear   . Chronic lower back pain   . Cocaine abuse    crack cocaine  . Depression   . Fibromyalgia   . GERD (gastroesophageal reflux disease)   . Hepatitis C   . Hepatitis C antibody test positive   . Hypertension   . Opioid dependence (HCC)   . Seizures (HCC)    "don't know why; I've had 2 in the past year" (08/18/2016)  . Trichomonas 04/17/2011   Diagnosed 04/02/11 during hospitalization, treated with Flagyl 2g, patient instructed to have partner treated (must follow-up)   . Tuberculosis    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 age 72, now s/p 1-yr of multidrug treatment (dates unknown)    Patient Active Problem List   Diagnosis Date Noted  . Acute kidney injury (HCC) 08/18/2016  . Boils 08/18/2016  . Rhabdomyolysis  08/18/2016  . Urinary retention 08/18/2016  . ARF (acute renal failure) (HCC) 08/18/2016  . AKI (acute kidney injury) (HCC) 08/17/2016  . Hallucination 08/17/2016  . Nausea & vomiting 10/04/2015  . Pruritic dermatitis 09/10/2015  . Rash and nonspecific skin eruption 08/21/2015  . Body lice 08/05/2015  . Prolonged QT interval 07/18/2015  . Dysmenorrhea 04/10/2015  . Trichimoniasis 04/10/2015  . Chest pain 03/07/2015  . Healthcare maintenance 03/07/2015  . RLQ abdominal pain 02/18/2012  . GERD (gastroesophageal reflux disease) 01/28/2012  . Menorrhagia 01/25/2012  . Weakness generalized 12/05/2011  . Tobacco abuse 11/06/2011  . HTN (hypertension) 10/22/2011  . Abscess and cellulitis 10/22/2011  . Hepatitis C, chronic (HCC) 10/22/2011  . History of TB (tuberculosis) 10/22/2011  . Opioid dependence (HCC)   . Polysubstance abuse 04/17/2011    Past Surgical History:  Procedure Laterality Date  . CESAREAN SECTION  1984  . DILATION AND CURETTAGE OF UTERUS    . I&D EXTREMITY  10/18/2011   Procedure: IRRIGATION AND DEBRIDEMENT EXTREMITY;  Surgeon: Sharma Covert, MD;  Location: [facility] OR;  Service: Orthopedics;  Laterality: Left;  . I&D EXTREMITY  10/21/2011   Procedure: IRRIGATION AND DEBRIDEMENT EXTREMITY;  Surgeon: Sharma Covert, MD;  Location: [facility] OR;  Service: Orthopedics;  Laterality: Left;  . SALPINGECTOMY Left March 2010   ectopic pregnancy    OB History    No data available       Home Medications    Prior to  Admission medications   Medication Sig Start Date End Date Taking? Authorizing Provider  amLODipine (NORVASC) 10 MG tablet Take 1 tablet (10 mg total) by mouth daily. 07/27/16   Ardith Dark, MD  camphor-menthol [facility]) lotion Apply 1 application topically as needed for itching. 07/27/16   Ardith Dark, MD  cetirizine (ZYRTEC) 10 MG tablet Take 1 tablet (10 mg total) by mouth daily. Patient taking differently: Take 10 mg by mouth daily as needed for allergies.   07/27/16   Ardith Dark, MD  cloNIDine (CATAPRES) 0.1 MG tablet Take 0.1 mg by mouth at bedtime.    Historical Provider, MD  cyclobenzaprine (FLEXERIL) 10 MG tablet Take 10 mg by mouth daily.    Historical Provider, MD  ferrous sulfate 325 (65 FE) MG tablet Take 325 mg by mouth daily with breakfast.    Historical Provider, MD  gabapentin (NEURONTIN) 300 MG capsule Take 300 mg by mouth 2 (two) times daily.    Historical Provider, MD  hydrochlorothiazide (HYDRODIURIL) 25 MG tablet Take 1 tablet (25 mg total) by mouth daily. 04/02/16   Ardith Dark, MD  hydrOXYzine (ATARAX/VISTARIL) 25 MG tablet Take 1 tablet (25 mg total) by mouth every 8 (eight) hours as needed for itching (for severe itching.). Patient taking differently: Take 25 mg by mouth 4 (four) times daily.  07/27/16   Ardith Dark, MD  lisinopril (PRINIVIL,ZESTRIL) 20 MG tablet Take 1 tablet (20 mg total) by mouth daily. 04/02/16   Ardith Dark, MD  methadone (DOLOPHINE) 5 MG/5ML solution Take 80 mg by mouth every morning.     Historical Provider, MD  methocarbamol (ROBAXIN) 500 MG tablet Take 1 tablet (500 mg total) by mouth 2 (two) times daily. Patient taking differently: Take 500 mg by mouth 2 (two) times daily as needed for muscle spasms.  07/27/16   Tatyana Kirichenko, PA-C  montelukast (SINGULAIR) 10 MG tablet Take 10 mg by mouth at bedtime.    Historical Provider, MD  naproxen (NAPROSYN) 500 MG tablet Take 1 tablet (500 mg total) by mouth 2 (two) times daily. 11/26/16   Dilpreet Faires Manuel [place], [place]  neomycin-bacitracin-polymyxin (NEOSPORIN) OINT Apply 1 application topically 3 (three) times daily. 08/19/16   Osvaldo Shipper, MD  nicotine (NICODERM CQ - DOSED IN MG/24 HOURS) 21 mg/24hr patch Place 1 patch (21 mg total) onto the skin daily. 08/20/16   Osvaldo Shipper, MD  polyethylene glycol powder (GLYCOLAX/MIRALAX) powder Take 34 g by mouth 2 (two) times daily as needed. Take until you have a bowel movement. Patient taking differently:  Take 17-34 g by mouth 2 (two) times daily as needed (for constipation.).  11/12/15   Yolande Jolly, MD  predniSONE (DELTASONE) 10 MG tablet Take 2 tabs once daily for 5 days, then Take 1 tab once daily for 5 days, then STOP. 08/19/16   Osvaldo Shipper, MD  ranitidine (ZANTAC) 150 MG tablet Take 150 mg by mouth daily.    Historical Provider, MD  triamcinolone cream (KENALOG) 0.1 % Apply 1 application topically 2 (two) times daily. Patient not taking: Reported on 08/17/2016 08/15/16   Bethel Born, PA-C    Family History Family History  Problem Relation Age of Onset  . Heart attack Mother   . Heart attack Father   . Heart attack Paternal Grandmother   . Heart attack Paternal Grandfather   . Cirrhosis Brother     Social History Social History  Substance Use Topics  . Smoking status: Current Every Day Smoker  Packs/day: 0.50    Years: 32.00    Types: Cigarettes  . Smokeless tobacco: Never Used  . Alcohol use 0.0 oz/week     Comment: occ     Allergies   Patient has no known allergies.   Review of Systems Review of Systems  Constitutional: Negative for chills and fever.  Gastrointestinal: Negative for diarrhea, nausea and vomiting.  Musculoskeletal: Positive for arthralgias (right hand), joint swelling and myalgias.  Neurological: Negative for weakness.     Physical Exam Updated Vital Signs BP 135/97 (BP Location: Left Arm)   Pulse 66   Temp 97.7 F (36.5 C) (Oral)   Resp 20   Wt 64 kg   LMP 12/06/2015   SpO2 96%   BMI 24.98 kg/m   Physical Exam  Constitutional: She is oriented to person, place, and time. She appears well-developed and well-nourished.  HENT:  Head: Normocephalic and atraumatic.  Neck: Normal range of motion.  Cardiovascular: Normal rate and intact distal pulses.   Pulmonary/Chest: Effort normal.  Musculoskeletal: Normal range of motion. She exhibits tenderness. She exhibits no edema or deformity.  Right hand with full ROM. No redness  or swelling noted. BLE full ROM. No tenderness to palpation. No redness or swelling.   Neurological: She is alert and oriented to person, place, and time. No sensory deficit.  Sensation intact to all 4 extremities. Muscle strength 5/5 to all 4 extremities and against resistance to wrist, hand, finger flexion and extension.  Tinnel's and Phalens test negative.   Skin: Skin is warm and dry. Capillary refill takes less than 2 seconds. No erythema.  Small 2 cm lesion throughout BUE with no evidence of erythema or infection.   Psychiatric: She has a normal mood and affect.  Nursing note and vitals reviewed.    ED Treatments / Results  DIAGNOSTIC STUDIES: Oxygen Saturation is 96% on RA, adequate by my interpretation.    COORDINATION OF CARE: 1:21 PM Discussed treatment plan with pt at bedside and pt agreed to plan.   Labs (all labs ordered are listed, but only abnormal results are displayed) Labs Reviewed - No data to display  EKG  EKG Interpretation None       Radiology No results found.  Procedures Procedures (including critical care time)  Medications Ordered in ED Medications - No data to display   Initial Impression / Assessment and Plan / ED Course  I have reviewed the triage vital signs and the nursing notes.  Pertinent labs & imaging results that were available during my care of the patient were reviewed by me and considered in my medical decision making (see chart for details).    Pt here with likely musculoskeletal pain. Patient here with complaints of right hand pain and BLE. She has hx of polysubstance abuse and rhabdomyeololysis.  Patient is in no apparent distress, afebrile, hemodynamically stable. She has minimal tenderness to palpation on exam of the right hand. No swelling, redness, deformity. She has distal pulses intact in all 4 extremities. Sensation intact all extremities. Muscle strength 5/5 in all 4 extremities. Patient able to stand and walk with out  difficulty. Low suspicion for carpel tunnel, DVT, cellulitis, or rhabdomyolysis at this time. The patient is safe for discharge at this time. Patient encouraged to follow up with primary care provider in one week as needed. Patient given symptomatically treatment. Reasons to immediately return to the right scrotum discussed.   Final Clinical Impressions(s) / ED Diagnoses   Final diagnoses:  Right hand  pain    New Prescriptions Discharge Medication List as of 11/26/2016  1:55 PM    START taking these medications   Details  naproxen (NAPROSYN) 500 MG tablet Take 1 tablet (500 mg total) by mouth 2 (two) times daily., Starting Thu 11/26/2016, Print       I personally performed the services described in this documentation, which was scribed in my presence. The recorded information has been reviewed and is accurate.   [address] [place], [place] 11/26/16 1405    Nira Conn, MD 11/27/16 [phone]

## 2016-11-26 NOTE — ED Triage Notes (Signed)
Pt ambulating in hall, requesting discharge papers. PA advised

## 2016-11-26 NOTE — ED Triage Notes (Addendum)
Pt reports 2 weeks hx of r/arm pain. Denies trauma. Did not attempt OTC meds

## 2017-04-22 NOTE — Congregational Nurse Program (Signed)
Congregational Nurse Program Note  Date of Encounter: 04/02/2017  Past Medical History: Past Medical History:  Diagnosis Date  . Anxiety   . ASCUS (atypical squamous cells of undetermined significance) on Pap smear   . Chronic lower back pain   . Cocaine abuse    crack cocaine  . Depression   . Fibromyalgia   . GERD (gastroesophageal reflux disease)   . Hepatitis C   . Hepatitis C antibody test positive   . Hypertension   . Opioid dependence (HCC)   . Seizures (HCC)    "don't know why; I've had 2 in the past year" (08/18/2016)  . Trichomonas 04/17/2011   Diagnosed 04/02/11 during hospitalization, treated with Flagyl 2g, patient instructed to have partner treated (must follow-up)   . Tuberculosis    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 age 51, now s/p 1-yr of multidrug treatment (dates unknown)    Encounter Details:     CNP Questionnaire - 04/02/17 1607      Patient Demographics   Is this a new or existing patient? New   Patient is considered a/an Not Applicable   Race Caucasian/White     Patient Assistance   Location of Patient Assistance Not Applicable   Patient's financial/insurance status Low Income;Self-Pay (Uninsured)   Patient referred to apply for the following financial assistance [employer]   Food insecurities addressed Not Applicable   Transportation assistance No   Assistance securing medications No   Product/process development scientistducational health offerings Navigating the healthcare system     Encounter Details   Primary purpose of visit Navigating the Healthcare System   Was an Emergency Department visit averted? Not Applicable   Patient referred to Not Applicable   Was a mental health screening completed? (GAINS tool) No   Does patient have dental issues? No   Does patient have vision issues? No   Does your patient have an abnormal blood pressure today? No   Since previous encounter, have you referred patient for abnormal blood pressure that resulted in a new  diagnosis or medication change? No   Does your patient have an abnormal blood glucose today? No   Since previous encounter, have you referred patient for abnormal blood glucose that resulted in a new diagnosis or medication change? No   Was there a life-saving intervention made? No     Needed assistance with applying for orange card.  Provided application and homeless verification.

## 2017-05-12 ENCOUNTER — Encounter (HOSPITAL_COMMUNITY): Payer: Self-pay | Admitting: *Deleted

## 2017-05-12 ENCOUNTER — Inpatient Hospital Stay (HOSPITAL_COMMUNITY)
Admission: EM | Admit: 2017-05-12 | Discharge: 2017-05-13 | DRG: 894 | Discharge status: Against Medical Advice | Payer: No Typology Code available for payment source | Attending: Family Medicine | Admitting: Family Medicine

## 2017-05-12 ENCOUNTER — Emergency Department (HOSPITAL_COMMUNITY): Payer: Self-pay

## 2017-05-12 DIAGNOSIS — L03114 Cellulitis of left upper limb: Secondary | ICD-10-CM

## 2017-05-12 DIAGNOSIS — Z79899 Other long term (current) drug therapy: Secondary | ICD-10-CM

## 2017-05-12 DIAGNOSIS — B182 Chronic viral hepatitis C: Secondary | ICD-10-CM | POA: Diagnosis present

## 2017-05-12 DIAGNOSIS — G8929 Other chronic pain: Secondary | ICD-10-CM | POA: Diagnosis present

## 2017-05-12 DIAGNOSIS — R6889 Other general symptoms and signs: Secondary | ICD-10-CM | POA: Diagnosis present

## 2017-05-12 DIAGNOSIS — F1910 Other psychoactive substance abuse, uncomplicated: Secondary | ICD-10-CM

## 2017-05-12 DIAGNOSIS — M797 Fibromyalgia: Secondary | ICD-10-CM | POA: Diagnosis present

## 2017-05-12 DIAGNOSIS — F17210 Nicotine dependence, cigarettes, uncomplicated: Secondary | ICD-10-CM | POA: Diagnosis present

## 2017-05-12 DIAGNOSIS — F1410 Cocaine abuse, uncomplicated: Secondary | ICD-10-CM | POA: Diagnosis present

## 2017-05-12 DIAGNOSIS — F1123 Opioid dependence with withdrawal: Principal | ICD-10-CM | POA: Diagnosis present

## 2017-05-12 DIAGNOSIS — K219 Gastro-esophageal reflux disease without esophagitis: Secondary | ICD-10-CM | POA: Diagnosis present

## 2017-05-12 DIAGNOSIS — I10 Essential (primary) hypertension: Secondary | ICD-10-CM

## 2017-05-12 DIAGNOSIS — R112 Nausea with vomiting, unspecified: Secondary | ICD-10-CM

## 2017-05-12 DIAGNOSIS — Z8249 Family history of ischemic heart disease and other diseases of the circulatory system: Secondary | ICD-10-CM

## 2017-05-12 DIAGNOSIS — R001 Bradycardia, unspecified: Secondary | ICD-10-CM

## 2017-05-12 DIAGNOSIS — Z8611 Personal history of tuberculosis: Secondary | ICD-10-CM

## 2017-05-12 LAB — POC URINE PREG, ED: Preg Test, Ur: NEGATIVE

## 2017-05-12 LAB — RAPID URINE DRUG SCREEN, HOSP PERFORMED
AMPHETAMINES: NOT DETECTED
BARBITURATES: NOT DETECTED
Benzodiazepines: NOT DETECTED
Cocaine: POSITIVE — AB
Opiates: POSITIVE — AB
TETRAHYDROCANNABINOL: NOT DETECTED

## 2017-05-12 LAB — URINALYSIS, ROUTINE W REFLEX MICROSCOPIC
BILIRUBIN URINE: NEGATIVE
Glucose, UA: NEGATIVE mg/dL
HGB URINE DIPSTICK: NEGATIVE
KETONES UR: 5 mg/dL — AB
Leukocytes, UA: NEGATIVE
Nitrite: NEGATIVE
PH: 6 (ref 5.0–8.0)
Protein, ur: NEGATIVE mg/dL
Specific Gravity, Urine: 1.02 (ref 1.005–1.030)

## 2017-05-12 LAB — CBC
HEMATOCRIT: 45.8 % (ref 36.0–46.0)
HEMOGLOBIN: 15.3 g/dL — AB (ref 12.0–15.0)
MCH: 29.1 pg (ref 26.0–34.0)
MCHC: 33.4 g/dL (ref 30.0–36.0)
MCV: 87.2 fL (ref 78.0–100.0)
Platelets: 194 10*3/uL (ref 150–400)
RBC: 5.25 MIL/uL — AB (ref 3.87–5.11)
RDW: 14.1 % (ref 11.5–15.5)
WBC: 9 10*3/uL (ref 4.0–10.5)

## 2017-05-12 LAB — COMPREHENSIVE METABOLIC PANEL
ALBUMIN: 3.8 g/dL (ref 3.5–5.0)
ALT: 20 U/L (ref 14–54)
ANION GAP: 11 (ref 5–15)
AST: 23 U/L (ref 15–41)
Alkaline Phosphatase: 64 U/L (ref 38–126)
BILIRUBIN TOTAL: 0.6 mg/dL (ref 0.3–1.2)
BUN: 28 mg/dL — ABNORMAL HIGH (ref 6–20)
CALCIUM: 9.2 mg/dL (ref 8.9–10.3)
CHLORIDE: 106 mmol/L (ref 101–111)
CO2: 20 mmol/L — AB (ref 22–32)
CREATININE: 0.79 mg/dL (ref 0.44–1.00)
GFR calc Af Amer: >60 mL/min (ref >60)
GFR calc non Af Amer: >60 mL/min (ref >60)
Glucose, Bld: 89 mg/dL (ref 65–99)
POTASSIUM: 4.1 mmol/L (ref 3.5–5.1)
SODIUM: 137 mmol/L (ref 135–145)
Total Protein: 7.4 g/dL (ref 6.5–8.1)

## 2017-05-12 LAB — CK: Total CK: 32 U/L — ABNORMAL LOW (ref 38–234)

## 2017-05-12 LAB — TROPONIN I

## 2017-05-12 LAB — BRAIN NATRIURETIC PEPTIDE: B Natriuretic Peptide: 91.5 pg/mL (ref 0.0–100.0)

## 2017-05-12 LAB — MAGNESIUM: Magnesium: 2.2 mg/dL (ref 1.7–2.4)

## 2017-05-12 LAB — TSH: TSH: 3.074 u[IU]/mL (ref 0.350–4.500)

## 2017-05-12 LAB — PHOSPHORUS: PHOSPHORUS: 2.8 mg/dL (ref 2.5–4.6)

## 2017-05-12 LAB — LIPASE, BLOOD: Lipase: 25 U/L (ref 11–51)

## 2017-05-12 LAB — ETHANOL: Alcohol, Ethyl (B): <5 mg/dL (ref <5)

## 2017-05-12 MED ORDER — CLONIDINE HCL 0.2 MG PO TABS
0.2000 mg | ORAL_TABLET | Freq: Once | ORAL | Status: AC
  Administered 2017-05-12: 0.2 mg via ORAL
  Filled 2017-05-12: qty 1

## 2017-05-12 MED ORDER — DOXYCYCLINE HYCLATE 100 MG PO TABS
100.0000 mg | ORAL_TABLET | Freq: Two times a day (BID) | ORAL | Status: DC
  Administered 2017-05-12: 100 mg via ORAL
  Filled 2017-05-12 (×2): qty 1

## 2017-05-12 MED ORDER — METHADONE HCL 10 MG PO TABS
30.0000 mg | ORAL_TABLET | Freq: Every morning | ORAL | Status: DC
  Administered 2017-05-13: 30 mg via ORAL
  Filled 2017-05-12: qty 3

## 2017-05-12 MED ORDER — THIAMINE HCL 100 MG/ML IJ SOLN: 100.0000 mg | Freq: Every day | INTRAMUSCULAR | Status: DC

## 2017-05-12 MED ORDER — ENOXAPARIN SODIUM 40 MG/0.4ML ~~LOC~~ SOLN
40.0000 mg | SUBCUTANEOUS | Status: DC
  Filled 2017-05-12: qty 0.4

## 2017-05-12 MED ORDER — TETANUS-DIPHTH-ACELL PERTUSSIS 5-2.5-18.5 LF-MCG/0.5 IM SUSP
0.5000 mL | Freq: Once | INTRAMUSCULAR | Status: DC
  Filled 2017-05-12: qty 0.5

## 2017-05-12 MED ORDER — ACETAMINOPHEN 325 MG PO TABS
650.0000 mg | ORAL_TABLET | Freq: Once | ORAL | Status: AC
Start: 2017-05-12 — End: 2017-05-12
  Administered 2017-05-12: 650 mg via ORAL
  Filled 2017-05-12: qty 2

## 2017-05-12 MED ORDER — LORAZEPAM 2 MG/ML IJ SOLN
1.0000 mg | Freq: Once | INTRAMUSCULAR | Status: AC
  Administered 2017-05-12: 1 mg via INTRAVENOUS
  Filled 2017-05-12: qty 1

## 2017-05-12 MED ORDER — ACETAMINOPHEN 325 MG PO TABS
650.0000 mg | ORAL_TABLET | Freq: Four times a day (QID) | ORAL | Status: DC | PRN
  Administered 2017-05-12 – 2017-05-13 (×3): 650 mg via ORAL
  Filled 2017-05-12 (×3): qty 2

## 2017-05-12 MED ORDER — SODIUM CHLORIDE 0.9 % IV BOLUS (SEPSIS)
1000.0000 mL | Freq: Once | INTRAVENOUS | Status: AC
  Administered 2017-05-12: 1000 mL via INTRAVENOUS

## 2017-05-12 MED ORDER — SODIUM CHLORIDE 0.9% FLUSH
3.0000 mL | Freq: Two times a day (BID) | INTRAVENOUS | Status: DC
  Administered 2017-05-12: 3 mL via INTRAVENOUS

## 2017-05-12 MED ORDER — LORAZEPAM 2 MG/ML IJ SOLN: 1.0000 mg | Freq: Four times a day (QID) | INTRAMUSCULAR | Status: DC | PRN

## 2017-05-12 MED ORDER — VITAMIN B-1 100 MG PO TABS
100.0000 mg | ORAL_TABLET | Freq: Every day | ORAL | Status: DC
  Filled 2017-05-12: qty 1

## 2017-05-12 MED ORDER — SODIUM CHLORIDE 0.9 % IV SOLN
INTRAVENOUS | Status: DC
  Administered 2017-05-12 – 2017-05-13 (×2): via INTRAVENOUS

## 2017-05-12 MED ORDER — HYDRALAZINE HCL 20 MG/ML IJ SOLN: 2.0000 mg | Freq: Four times a day (QID) | INTRAMUSCULAR | Status: DC | PRN

## 2017-05-12 MED ORDER — IBUPROFEN 400 MG PO TABS
400.0000 mg | ORAL_TABLET | Freq: Once | ORAL | Status: AC
  Administered 2017-05-12: 400 mg via ORAL
  Filled 2017-05-12: qty 1

## 2017-05-12 MED ORDER — ADULT MULTIVITAMIN W/MINERALS CH
1.0000 | ORAL_TABLET | Freq: Every day | ORAL | Status: DC
  Filled 2017-05-12: qty 1

## 2017-05-12 MED ORDER — ACETAMINOPHEN 650 MG RE SUPP: 650.0000 mg | Freq: Four times a day (QID) | RECTAL | Status: DC | PRN

## 2017-05-12 MED ORDER — LORAZEPAM 1 MG PO TABS: 1.0000 mg | ORAL_TABLET | Freq: Four times a day (QID) | ORAL | Status: DC | PRN

## 2017-05-12 MED ORDER — GABAPENTIN 300 MG PO CAPS
300.0000 mg | ORAL_CAPSULE | Freq: Two times a day (BID) | ORAL | Status: DC | PRN
  Administered 2017-05-12 – 2017-05-13 (×2): 300 mg via ORAL
  Filled 2017-05-12 (×2): qty 1

## 2017-05-12 MED ORDER — FOLIC ACID 1 MG PO TABS
1.0000 mg | ORAL_TABLET | Freq: Every day | ORAL | Status: DC
  Filled 2017-05-12: qty 1

## 2017-05-12 MED ORDER — CLINDAMYCIN PHOSPHATE 600 MG/50ML IV SOLN
600.0000 mg | Freq: Once | INTRAVENOUS | Status: AC
  Administered 2017-05-12: 600 mg via INTRAVENOUS
  Filled 2017-05-12: qty 50

## 2017-05-12 NOTE — ED Notes (Signed)
MD at bedside. 

## 2017-05-12 NOTE — ED Notes (Signed)
MD informed of pts ongoing bradycardia

## 2017-05-12 NOTE — ED Provider Notes (Signed)
[facility]-EMERGENCY DEPT Provider Note   CSN: [id number] Arrival date & time: 05/12/17  7829     History   Chief Complaint Chief Complaint  Patient presents with  . Generalized Body Aches  . Withdrawal    HPI Ariana Jensen is a 51 y.o. female.  The history is provided by the patient and medical records.  Emesis   This is a new problem. The current episode started 12 to 24 hours ago. The problem occurs continuously. The problem has not changed since onset.The emesis has an appearance of stomach contents. There has been no fever. Associated symptoms include abdominal pain, diarrhea and sweats. Pertinent negatives include no chills, no cough, no fever, no headaches and no URI. Risk factors: concern for withdrawl.    Past Medical History:  Diagnosis Date  . Anxiety   . ASCUS (atypical squamous cells of undetermined significance) on Pap smear   . Chronic lower back pain   . Cocaine abuse    crack cocaine  . Depression   . Fibromyalgia   . GERD (gastroesophageal reflux disease)   . Hepatitis C   . Hepatitis C antibody test positive   . Hypertension   . Opioid dependence (HCC)   . Seizures (HCC)    "don't know why; I've had 2 in the past year" (08/18/2016)  . Trichomonas 04/17/2011   Diagnosed 04/02/11 during hospitalization, treated with Flagyl 2g, patient instructed to have partner treated (must follow-up)   . Tuberculosis    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 age 36, now s/p 1-yr of multidrug treatment (dates unknown)    Patient Active Problem List   Diagnosis Date Noted  . Acute kidney injury (HCC) 08/18/2016  . Boils 08/18/2016  . Rhabdomyolysis 08/18/2016  . Urinary retention 08/18/2016  . ARF (acute renal failure) (HCC) 08/18/2016  . AKI (acute kidney injury) (HCC) 08/17/2016  . Hallucination 08/17/2016  . Nausea & vomiting 10/04/2015  . Pruritic dermatitis 09/10/2015  . Rash and nonspecific skin eruption 08/21/2015  . Body lice 08/05/2015  . Prolonged QT  interval 07/18/2015  . Dysmenorrhea 04/10/2015  . Trichimoniasis 04/10/2015  . Chest pain 03/07/2015  . Healthcare maintenance 03/07/2015  . RLQ abdominal pain 02/18/2012  . GERD (gastroesophageal reflux disease) 01/28/2012  . Menorrhagia 01/25/2012  . Weakness generalized 12/05/2011  . Tobacco abuse 11/06/2011  . HTN (hypertension) 10/22/2011  . Abscess and cellulitis 10/22/2011  . Hepatitis C, chronic (HCC) 10/22/2011  . History of TB (tuberculosis) 10/22/2011  . Opioid dependence (HCC)   . Polysubstance abuse 04/17/2011    Past Surgical History:  Procedure Laterality Date  . CESAREAN SECTION  1984  . DILATION AND CURETTAGE OF UTERUS    . I&D EXTREMITY  10/18/2011   Procedure: IRRIGATION AND DEBRIDEMENT EXTREMITY;  Surgeon: Sharma Covert, MD;  Location: [facility] OR;  Service: Orthopedics;  Laterality: Left;  . I&D EXTREMITY  10/21/2011   Procedure: IRRIGATION AND DEBRIDEMENT EXTREMITY;  Surgeon: Sharma Covert, MD;  Location: [facility] OR;  Service: Orthopedics;  Laterality: Left;  . SALPINGECTOMY Left March 2010   ectopic pregnancy    OB History    No data available       Home Medications    Prior to Admission medications   Medication Sig Start Date End Date Taking? Authorizing Provider  amLODipine (NORVASC) 10 MG tablet Take 1 tablet (10 mg total) by mouth daily. 07/27/16   Ardith Dark, MD  camphor-menthol [facility]) lotion Apply 1 application topically as needed for itching. 07/27/16  Ardith Dark, MD  cetirizine (ZYRTEC) 10 MG tablet Take 1 tablet (10 mg total) by mouth daily. Patient taking differently: Take 10 mg by mouth daily as needed for allergies.  07/27/16   Ardith Dark, MD  cloNIDine (CATAPRES) 0.1 MG tablet Take 0.1 mg by mouth at bedtime.    [provider]  cyclobenzaprine (FLEXERIL) 10 MG tablet Take 10 mg by mouth daily.    [provider]  ferrous sulfate 325 (65 FE) MG tablet Take 325 mg by mouth daily with breakfast.    [provider]  gabapentin (NEURONTIN) 300 MG capsule Take 300 mg by mouth 2 (two) times daily.    [provider]  hydrochlorothiazide (HYDRODIURIL) 25 MG tablet Take 1 tablet (25 mg total) by mouth daily. 04/02/16   Ardith Dark, MD  hydrOXYzine (ATARAX/VISTARIL) 25 MG tablet Take 1 tablet (25 mg total) by mouth every 8 (eight) hours as needed for itching (for severe itching.). Patient taking differently: Take 25 mg by mouth 4 (four) times daily.  07/27/16   Ardith Dark, MD  lisinopril (PRINIVIL,ZESTRIL) 20 MG tablet Take 1 tablet (20 mg total) by mouth daily. 04/02/16   Ardith Dark, MD  methadone (DOLOPHINE) 5 MG/5ML solution Take 80 mg by mouth every morning.     [provider]  methocarbamol (ROBAXIN) 500 MG tablet Take 1 tablet (500 mg total) by mouth 2 (two) times daily. Patient taking differently: Take 500 mg by mouth 2 (two) times daily as needed for muscle spasms.  07/27/16   Kirichenko, Tatyana, PA-C  montelukast (SINGULAIR) 10 MG tablet Take 10 mg by mouth at bedtime.    [provider]  naproxen (NAPROSYN) 500 MG tablet Take 1 tablet (500 mg total) by mouth 2 (two) times daily. 11/26/16   Espina, Lucita Lora, PA  neomycin-bacitracin-polymyxin (NEOSPORIN) OINT Apply 1 application topically 3 (three) times daily. 08/19/16   Osvaldo Shipper, MD  nicotine (NICODERM CQ - DOSED IN MG/24 HOURS) 21 mg/24hr patch Place 1 patch (21 mg total) onto the skin daily. 08/20/16   Osvaldo Shipper, MD  polyethylene glycol powder (GLYCOLAX/MIRALAX) powder Take 34 g by mouth 2 (two) times daily as needed. Take until you have a bowel movement. Patient taking differently: Take 17-34 g by mouth 2 (two) times daily as needed (for constipation.).  11/12/15   Melancon, Hillery Hunter, MD  predniSONE (DELTASONE) 10 MG tablet Take 2 tabs once daily for 5 days, then Take 1 tab once daily for 5 days, then STOP. 08/19/16   Osvaldo Shipper, MD  ranitidine (ZANTAC) 150 MG tablet Take 150 mg  by mouth daily.    [provider]  triamcinolone cream (KENALOG) 0.1 % Apply 1 application topically 2 (two) times daily. Patient not taking: Reported on 08/17/2016 08/15/16   Bethel Born, PA-C    Family History Family History  Problem Relation Age of Onset  . Heart attack Mother   . Heart attack Father   . Heart attack Paternal Grandmother   . Heart attack Paternal Grandfather   . Cirrhosis Brother     Social History Social History  Substance Use Topics  . Smoking status: Current Every Day Smoker    Packs/day: 0.50    Years: 32.00    Types: Cigarettes  . Smokeless tobacco: Never Used  . Alcohol use 0.0 oz/week     Comment: occ     Allergies   Patient has no known allergies.   Review of Systems  Review of Systems  Constitutional: Positive for diaphoresis. Negative for chills, fatigue and fever.  HENT: Negative for congestion.   Eyes: Negative for visual disturbance.  Respiratory: Positive for shortness of breath. Negative for cough, chest tightness and wheezing.   Cardiovascular: Negative for chest pain, palpitations and leg swelling.  Gastrointestinal: Positive for abdominal pain, diarrhea, nausea and vomiting. Negative for constipation.  Genitourinary: Negative for dysuria, flank pain and frequency.  Musculoskeletal: Positive for back pain. Negative for neck pain.  Skin: Negative for rash.  Neurological: Negative for seizures, syncope, weakness, light-headedness and headaches.  Psychiatric/Behavioral: Positive for agitation. Negative for behavioral problems.  All other systems reviewed and are negative.    Physical Exam Updated Vital Signs BP (!) 158/110 (BP Location: Right Arm)   Pulse 74   Temp (!) 95.7 F (35.4 C) (Axillary)   Resp 20   Ht 5\' 3"  (1.6 m)   Wt 63.5 kg (140 lb)   LMP 12/06/2015   SpO2 100%   BMI 24.80 kg/m   Physical Exam  Constitutional: She appears well-developed and well-nourished. She appears distressed.  HENT:    Head: Normocephalic and atraumatic.  Mouth/Throat: Oropharynx is clear and moist. No oropharyngeal exudate.  Eyes: Pupils are equal, round, and reactive to light. Conjunctivae and EOM are normal.  Neck: Normal range of motion.  Cardiovascular: Normal rate and intact distal pulses.   No murmur heard. Pulmonary/Chest: Effort normal. No stridor. No respiratory distress. She has no wheezes. She exhibits no tenderness.  Abdominal: Soft. There is no tenderness.  Musculoskeletal: She exhibits no tenderness.  Neurological: No sensory deficit. She exhibits normal muscle tone.  Skin: Capillary refill takes less than 2 seconds. No rash noted. She is diaphoretic. No erythema.  Psychiatric: She has a normal mood and affect.  Nursing note and vitals reviewed.    ED Treatments / Results  Labs (all labs ordered are listed, but only abnormal results are displayed) Labs Reviewed  CBC - Abnormal; Notable for the following:       Result Value   RBC 5.25 (*)    Hemoglobin 15.3 (*)    All other components within normal limits  RAPID URINE DRUG SCREEN, HOSP PERFORMED - Abnormal; Notable for the following:    Opiates POSITIVE (*)    Cocaine POSITIVE (*)    All other components within normal limits  COMPREHENSIVE METABOLIC PANEL - Abnormal; Notable for the following:    CO2 20 (*)    BUN 28 (*)    All other components within normal limits  URINALYSIS, ROUTINE W REFLEX MICROSCOPIC - Abnormal; Notable for the following:    APPearance HAZY (*)    Ketones, ur 5 (*)    All other components within normal limits  CK - Abnormal; Notable for the following:    Total CK 32 (*)    All other components within normal limits  CULTURE, BLOOD (ROUTINE X 2)  CULTURE, BLOOD (ROUTINE X 2)  ETHANOL  LIPASE, BLOOD  MAGNESIUM  PHOSPHORUS  TSH  TROPONIN I  TROPONIN I  TROPONIN I  COMPREHENSIVE METABOLIC PANEL  CBC  BRAIN NATRIURETIC PEPTIDE  POC URINE PREG, ED    EKG  EKG  Interpretation  Date/Time:  Wednesday May 12 2017 09:10:54 EDT Ventricular Rate:  54 PR Interval:    QRS Duration: 81 QT Interval:  540 QTC Calculation: 483 R Axis:   68 Text Interpretation:  Sinus bradycardia Atrial premature complexes in couplets Minimal ST depression, anterolateral leads Baseline wander in lead(s)  V3 When compared to prior, improved QTc. Wandering baseline.  No STEMI Confirmed by Theda Belfast ([id number]) on 05/12/2017 10:12:12 AM       Radiology Dg Chest 2 View  Result Date: 05/12/2017 CLINICAL DATA:  Methadone treatment.  Withdrawal. EXAM: CHEST  2 VIEW COMPARISON:  06/15/2016. FINDINGS: Mild right base subsegmental atelectasis and/or scarring. No focal alveolar infiltrate. No pleural effusion or pneumothorax. Heart size normal. No acute bony abnormality . Thoracic spine scoliosis. IMPRESSION: Mild right base subsegmental atelectasis and/or scarring. Electronically Signed   By: Maisie Fus  Register   On: 05/12/2017 09:52   Dg Humerus Left  Result Date: 05/12/2017 CLINICAL DATA:  51 y/o F; cut by glass just above elbow on the inside of the arm. EXAM: LEFT HUMERUS - 2+ VIEW COMPARISON:  None. FINDINGS: There is no evidence of fracture or other focal bone lesions. Mild irregularity of soft tissue on the inside of the arm above the elbow corresponding to region of laceration. No radiopaque foreign body identified. IMPRESSION: No osseous abnormality.  No radiopaque foreign body identified. Electronically Signed   By: Mitzi Hansen M.D.   On: 05/12/2017 14:44    Procedures Procedures (including critical care time)  Medications Ordered in ED Medications  methadone (DOLOPHINE) tablet 30 mg (not administered)  thiamine (VITAMIN B-1) tablet 100 mg (not administered)    Or  thiamine (B-1) injection 100 mg (not administered)  folic acid (FOLVITE) tablet 1 mg (not administered)  multivitamin with minerals tablet 1 tablet (not administered)  enoxaparin (LOVENOX)  injection 40 mg (not administered)  sodium chloride flush (NS) 0.9 % injection 3 mL (not administered)  0.9 %  sodium chloride infusion (not administered)  acetaminophen (TYLENOL) tablet 650 mg (not administered)    Or  acetaminophen (TYLENOL) suppository 650 mg (not administered)  doxycycline (VIBRA-TABS) tablet 100 mg (not administered)  gabapentin (NEURONTIN) capsule 300 mg (300 mg Oral Given 05/12/17 1751)  Tdap (BOOSTRIX) injection 0.5 mL (not administered)  hydrALAZINE (APRESOLINE) injection 2 mg (not administered)  sodium chloride 0.9 % bolus 1,000 mL (0 mLs Intravenous Stopped 05/12/17 1156)  cloNIDine (CATAPRES) tablet 0.2 mg (0.2 mg Oral Given 05/12/17 0928)  LORazepam (ATIVAN) injection 1 mg (1 mg Intravenous Given 05/12/17 0925)  ibuprofen (ADVIL,MOTRIN) tablet 400 mg (400 mg Oral Given 05/12/17 1508)  sodium chloride 0.9 % bolus 1,000 mL (1,000 mLs Intravenous New Bag/Given 05/12/17 1508)  clindamycin (CLEOCIN) IVPB 600 mg (0 mg Intravenous Stopped 05/12/17 1721)  acetaminophen (TYLENOL) tablet 650 mg (650 mg Oral Given 05/12/17 1751)     Initial Impression / Assessment and Plan / ED Course  I have reviewed the triage vital signs and the nursing notes.  Pertinent labs & imaging results that were available during my care of the patient were reviewed by me and considered in my medical decision making (see chart for details).    Ariana Jensen is a 50 y.o. female with a past medical history significant for polysubstance abuse and heroin on methadone therapy for 17 years, hepatitis C, prior rhabdomyolysis, and history of QT prolongation who presents with nausea, vomiting, diarrhea, body aching, and concern for opioid withdrawal. Patient says that she was previously managed at [facility] where she gets her methadone however, she decided sterile days ago that she wanted to switch to Suboxone. She says that she says that for the last day she has had worsening nausea and vomiting. She says that  she has not been able to keep any fluids down for the last few days.  She is not able to eat. She is feeling dehydrated and anxious. She is having pain all over her body but denies any chest pain or cough. She does report some shortness of breath. She reports no urinary symptoms reports diarrhea. She denies any focal neurologic deficits but says that she has had withdrawal symptoms in the past.  On exam, patient is tremulous and ill-appearing. Patient is doubled over and generalized pain and nausea. Patient's abdomen was nontender. Back was nontender. Lungs were clear. Patient had no focal neurologic deficits. No evidence of cellulitis or abscess in her arm where she last injected in the left antecubital fossa. No CVA tenderness. Next  Patient will be assessed for possible withdrawal versus other etiologies of her symptoms. Patient will be given clonidine and Valium to start treating her withdrawal symptoms. Promethazine was considered however she will have EKG first determine level of QT prolongation. Patient had screening laboratory testing to look for occult infection or significant electrolyte abnormalities from dehydration. This fit reassessment following workup.     After medication administration, patient had bradycardia. Patient's heart rate has been in the 30s and 40s. Patient is tired but arousable. When spoken with, heart rate improved into the 50s. Suspect possible clonidine administration and contributed to symptoms. We'll continue fluids and observing patient.  Patient reassessed and had some improvement in her heart rate. It is now in the 40s and 50s. Patient then reported that she was having more pain from her left arm. On assessment, patient has a 60 m laceration from last week. She reports that her arm went through a window and was cut. She thinks that she is up-to-date with her tetanus vaccination but says that some. Once has been coming out of the wound. She also has redness and tenderness  surrounding the area.   On exam, patient does appear to have a infection. Patient given IV antibiotics. X-ray was obtained to look for subcutaneous air. No evidence of gas or retained needles. Patient denies injecting anything into this location in the past.   Family medicine team will be called for admission due to the patient's irregular PVCs, intermittent bradycardia, and concern for worsening cellulitis of the arm.    Final Clinical Impressions(s) / ED Diagnoses   Final diagnoses:  Cellulitis of left upper extremity  Bradycardia  Intractable vomiting with nausea, unspecified vomiting type    Clinical Impression: 1. Cellulitis of left upper extremity   2. Bradycardia   3. Intractable vomiting with nausea, unspecified vomiting type     Disposition: Admit to Family medicine    Dencil Cayson, Canary Brim, MD 05/12/17 [phone]

## 2017-05-12 NOTE — ED Notes (Signed)
MD at bedside, pt given Coke will continue to monitor

## 2017-05-12 NOTE — ED Notes (Signed)
Bedside report given to 3E RN, bed placement contacted re: room assignment change, RN Tresa Endo 5W notified of change

## 2017-05-12 NOTE — ED Notes (Signed)
Per pharmacy tech Methadone clinic reported pt rcvd 30 mg Methadone this am, when asked  About this the pt stated, "I don't remember. Why am I hurting so bad? I just don't know if I took it or not."
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
## 2017-05-12 NOTE — ED Notes (Signed)
Admitting service notified re: pts pain med request, per provider pt to receive tylenol at this time

## 2017-05-12 NOTE — ED Notes (Addendum)
Pt yelling into the hall that she wants something for pain, RN notified.

## 2017-05-12 NOTE — ED Notes (Signed)
Tried to get patient blood. I didn't have any success. ?

## 2017-05-12 NOTE — ED Triage Notes (Signed)
Pt states she was supposed to from using methadone to suboxone last week, but, but she's been too sick.  States body aches and emesis.  Hypothermic in triage.  Given warm blankets.

## 2017-05-12 NOTE — ED Notes (Signed)
Only cbc collected

## 2017-05-13 ENCOUNTER — Emergency Department (HOSPITAL_COMMUNITY)
Admission: EM | Admit: 2017-05-13 | Discharge: 2017-05-13 | Discharge status: Against Medical Advice | Payer: No Typology Code available for payment source | Attending: Emergency Medicine | Admitting: Emergency Medicine

## 2017-05-13 ENCOUNTER — Encounter (HOSPITAL_COMMUNITY): Payer: Self-pay | Admitting: Emergency Medicine

## 2017-05-13 DIAGNOSIS — F1193 Opioid use, unspecified with withdrawal: Secondary | ICD-10-CM

## 2017-05-13 DIAGNOSIS — R112 Nausea with vomiting, unspecified: Secondary | ICD-10-CM | POA: Insufficient documentation

## 2017-05-13 DIAGNOSIS — F17210 Nicotine dependence, cigarettes, uncomplicated: Secondary | ICD-10-CM | POA: Insufficient documentation

## 2017-05-13 DIAGNOSIS — I10 Essential (primary) hypertension: Secondary | ICD-10-CM | POA: Insufficient documentation

## 2017-05-13 DIAGNOSIS — Z79899 Other long term (current) drug therapy: Secondary | ICD-10-CM | POA: Insufficient documentation

## 2017-05-13 DIAGNOSIS — F1123 Opioid dependence with withdrawal: Secondary | ICD-10-CM

## 2017-05-13 LAB — COMPREHENSIVE METABOLIC PANEL
ALBUMIN: 3.5 g/dL (ref 3.5–5.0)
ALK PHOS: 54 U/L (ref 38–126)
ALT: 19 U/L (ref 14–54)
AST: 22 U/L (ref 15–41)
Anion gap: 6 (ref 5–15)
BUN: 15 mg/dL (ref 6–20)
CALCIUM: 8.2 mg/dL — AB (ref 8.9–10.3)
CO2: 20 mmol/L — AB (ref 22–32)
CREATININE: 0.69 mg/dL (ref 0.44–1.00)
Chloride: 107 mmol/L (ref 101–111)
GFR calc non Af Amer: >60 mL/min (ref >60)
GLUCOSE: 86 mg/dL (ref 65–99)
Potassium: 3.4 mmol/L — ABNORMAL LOW (ref 3.5–5.1)
SODIUM: 133 mmol/L — AB (ref 135–145)
Total Bilirubin: 0.5 mg/dL (ref 0.3–1.2)
Total Protein: 6.7 g/dL (ref 6.5–8.1)

## 2017-05-13 LAB — CBC
HCT: 38.3 % (ref 36.0–46.0)
Hemoglobin: 12.6 g/dL (ref 12.0–15.0)
MCH: 28.9 pg (ref 26.0–34.0)
MCHC: 32.9 g/dL (ref 30.0–36.0)
MCV: 87.8 fL (ref 78.0–100.0)
PLATELETS: 246 10*3/uL (ref 150–400)
RBC: 4.36 MIL/uL (ref 3.87–5.11)
RDW: 13.9 % (ref 11.5–15.5)
WBC: 5.4 10*3/uL (ref 4.0–10.5)

## 2017-05-13 LAB — TROPONIN I
Troponin I: <0.03 ng/mL (ref <0.03)
Troponin I: <0.03 ng/mL (ref <0.03)

## 2017-05-13 MED ORDER — ONDANSETRON 4 MG PO TBDP
4.0000 mg | ORAL_TABLET | Freq: Once | ORAL | Status: AC | PRN
  Administered 2017-05-13: 4 mg via ORAL
  Filled 2017-05-13: qty 1

## 2017-05-13 MED ORDER — MUPIROCIN 2 % EX OINT
TOPICAL_OINTMENT | CUTANEOUS | Status: AC
  Administered 2017-05-13: 1
  Filled 2017-05-13: qty 22

## 2017-05-13 MED ORDER — GABAPENTIN 300 MG PO CAPS
300.0000 mg | ORAL_CAPSULE | Freq: Once | ORAL | Status: AC
  Administered 2017-05-13: 300 mg via ORAL
  Filled 2017-05-13: qty 1

## 2017-05-13 MED ORDER — CLONIDINE HCL 0.1 MG PO TABS
0.1000 mg | ORAL_TABLET | Freq: Once | ORAL | Status: AC
  Administered 2017-05-13: 0.1 mg via ORAL
  Filled 2017-05-13: qty 1

## 2017-05-13 MED ORDER — ACETAMINOPHEN 500 MG PO TABS
1000.0000 mg | ORAL_TABLET | Freq: Once | ORAL | Status: AC
  Administered 2017-05-13: 1000 mg via ORAL
  Filled 2017-05-13: qty 2

## 2017-05-13 MED ORDER — HYDRALAZINE HCL 20 MG/ML IJ SOLN
2.0000 mg | Freq: Four times a day (QID) | INTRAMUSCULAR | Status: DC | PRN
  Administered 2017-05-13: 2 mg via INTRAVENOUS
  Filled 2017-05-13: qty 1

## 2017-05-13 MED ORDER — LISINOPRIL 20 MG PO TABS
20.0000 mg | ORAL_TABLET | Freq: Every day | ORAL | Status: DC
  Filled 2017-05-13: qty 1

## 2017-05-13 NOTE — Progress Notes (Signed)
Patient left the hospital AMA prior to being seen by CM; B Shelba Flake [phone]

## 2017-05-13 NOTE — Progress Notes (Addendum)
Patient left the hospital AMA at 1026.  Patient was pacing up and down the hall crying, stating she has "another clinic to go to".  Patient ripped her IV, nurse cleaned and dressed the site.  Patient signed AMA paperwork and stated her ride was waiting downstairs. Elnita Maxwell, RN MD made aware of patient decision to go AMA. Elnita Maxwell, RN

## 2017-05-13 NOTE — ED Notes (Signed)
When returning to pt bed, pt had left without giving any notification. Unable to obtain vital signs and do pain assessment.
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
## 2017-05-13 NOTE — Progress Notes (Signed)
Patient contiguously asks for pain medication and something for sleep, explained to her multiple times that MD does not want to give her any extra doses of medications that could drop her HR. HR brady- 30s-50s.

## 2017-05-13 NOTE — ED Triage Notes (Signed)
Pt presents for methadone withdrawal. States that she had it yesterday but not today. Has used opiates x 17 years. Alert and oriented.
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
## 2017-05-17 LAB — CULTURE, BLOOD (ROUTINE X 2)
Culture: NO GROWTH
Culture: NO GROWTH
SPECIAL REQUESTS: ADEQUATE
Special Requests: ADEQUATE

## 2017-05-19 ENCOUNTER — Emergency Department (HOSPITAL_COMMUNITY)
Admission: EM | Admit: 2017-05-19 | Discharge: 2017-05-19 | Discharge status: Against Medical Advice | Payer: No Typology Code available for payment source | Attending: Emergency Medicine | Admitting: Emergency Medicine

## 2017-05-19 ENCOUNTER — Encounter (HOSPITAL_COMMUNITY): Payer: Self-pay | Admitting: *Deleted

## 2017-05-19 DIAGNOSIS — R079 Chest pain, unspecified: Secondary | ICD-10-CM | POA: Insufficient documentation

## 2017-05-19 MED ORDER — IBUPROFEN 400 MG PO TABS
400.0000 mg | ORAL_TABLET | Freq: Once | ORAL | Status: AC | PRN
  Administered 2017-05-19: 400 mg via ORAL

## 2017-05-19 MED ORDER — IBUPROFEN 400 MG PO TABS
ORAL_TABLET | ORAL | Status: AC
  Filled 2017-05-19: qty 1

## 2017-05-19 NOTE — ED Notes (Signed)
Called patient for vss

## 2017-05-19 NOTE — ED Triage Notes (Signed)
Pt reports being off all her meds x 4 days including methadone and bp meds. Pt has pain to entire body, appears anxious at triage and is hypertensive. Airway intact.

## 2017-05-19 NOTE — ED Notes (Signed)
Called pt with no response 
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
## 2017-05-19 NOTE — ED Notes (Signed)
Called pt name x3 to be roomed. No response. 

## 2017-06-06 ENCOUNTER — Emergency Department (HOSPITAL_COMMUNITY): Payer: No Typology Code available for payment source

## 2017-06-06 ENCOUNTER — Emergency Department (HOSPITAL_COMMUNITY)
Admission: EM | Admit: 2017-06-06 | Discharge: 2017-06-06 | Disposition: A | Discharge status: Home / Self Care | Payer: No Typology Code available for payment source | Attending: Emergency Medicine | Admitting: Emergency Medicine

## 2017-06-06 ENCOUNTER — Encounter (HOSPITAL_COMMUNITY): Payer: Self-pay | Admitting: Emergency Medicine

## 2017-06-06 DIAGNOSIS — Z79899 Other long term (current) drug therapy: Secondary | ICD-10-CM | POA: Insufficient documentation

## 2017-06-06 DIAGNOSIS — I10 Essential (primary) hypertension: Secondary | ICD-10-CM | POA: Insufficient documentation

## 2017-06-06 DIAGNOSIS — F17210 Nicotine dependence, cigarettes, uncomplicated: Secondary | ICD-10-CM | POA: Insufficient documentation

## 2017-06-06 DIAGNOSIS — R079 Chest pain, unspecified: Secondary | ICD-10-CM

## 2017-06-06 LAB — BASIC METABOLIC PANEL
Anion gap: 6 (ref 5–15)
BUN: 16 mg/dL (ref 6–20)
CALCIUM: 9.2 mg/dL (ref 8.9–10.3)
CO2: 23 mmol/L (ref 22–32)
Chloride: 106 mmol/L (ref 101–111)
Creatinine, Ser: 0.66 mg/dL (ref 0.44–1.00)
GFR calc Af Amer: >60 mL/min (ref >60)
GLUCOSE: 101 mg/dL — AB (ref 65–99)
Potassium: 3.5 mmol/L (ref 3.5–5.1)
SODIUM: 135 mmol/L (ref 135–145)

## 2017-06-06 LAB — CBC
HCT: 37.1 % (ref 36.0–46.0)
Hemoglobin: 11.8 g/dL — ABNORMAL LOW (ref 12.0–15.0)
MCH: 28.4 pg (ref 26.0–34.0)
MCHC: 31.8 g/dL (ref 30.0–36.0)
MCV: 89.4 fL (ref 78.0–100.0)
Platelets: 242 10*3/uL (ref 150–400)
RBC: 4.15 MIL/uL (ref 3.87–5.11)
RDW: 14.2 % (ref 11.5–15.5)
WBC: 6.8 10*3/uL (ref 4.0–10.5)

## 2017-06-06 LAB — I-STAT TROPONIN, ED
TROPONIN I, POC: 0 ng/mL (ref 0.00–0.08)
Troponin i, poc: 0 ng/mL (ref 0.00–0.08)

## 2017-06-06 MED ORDER — MORPHINE SULFATE (PF) 4 MG/ML IV SOLN
4.0000 mg | Freq: Once | INTRAVENOUS | Status: AC
  Administered 2017-06-06: 4 mg via INTRAVENOUS

## 2017-06-06 MED ORDER — MORPHINE SULFATE (PF) 4 MG/ML IV SOLN
4.0000 mg | Freq: Once | INTRAVENOUS | Status: DC
  Filled 2017-06-06: qty 1
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
## 2017-06-06 NOTE — ED Triage Notes (Addendum)
Pt in from home via [facility] with c/o intermittent, sharp cp and sob x 3 days. Pt is 1 PPD smoker, denies any cardiac hx. C/o R arm tingling, denies n/v and cough was given 324 ASA en route. Sats 100% on RA

## 2017-06-06 NOTE — Discharge Instructions (Signed)
Contact your primary care physician tomorrow to schedule an office visit for within the next few weeks. You may need further testing as an outpatient. Ask your primary care physician to help you to stop smoking. Return if concern for any reason

## 2017-06-06 NOTE — ED Notes (Signed)
Patient able to ambulate independently  

## 2017-06-06 NOTE — ED Notes (Addendum)
Patient out in hall.  States she needs to go home.  Made aware of need for troponin and patient agreed to wait for draw and results.  MD aware.

## 2017-06-06 NOTE — ED Provider Notes (Signed)
[facility]-EMERGENCY DEPT Provider Note   CSN: [id number] Arrival date & time: 06/06/17  1331     History   Chief Complaint Chief Complaint  Patient presents with  . Chest Pain  . Shortness of Breath    HPI Ariana Jensen is a 51 y.o. female.  HPI Pt started having sharp pain in her chest 3 days ago.   The pain has been on the right side, sharp intermittent.  Today the pain is more severe.  It radiates down her arm.  No increase  With movement.  No hx of PE or DVT.  No history of heart disease.  Pt has a history of drug abuse but has been on suboxone, no drug use for at least 5 days.  Denies any cocaine use. Past Medical History:  Diagnosis Date  . Anxiety   . ASCUS (atypical squamous cells of undetermined significance) on Pap smear   . Chronic lower back pain   . Cocaine abuse    crack cocaine  . Depression   . Fibromyalgia   . GERD (gastroesophageal reflux disease)   . Hepatitis C   . Hepatitis C antibody test positive   . Hypertension   . Opioid dependence (HCC)   . Seizures (HCC)    "don't know why; I've had 2 in the past year" (08/18/2016)  . Trichomonas 04/17/2011   Diagnosed 04/02/11 during hospitalization, treated with Flagyl 2g, patient instructed to have partner treated (must follow-up)   . Tuberculosis    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 age 83, now s/p 1-yr of multidrug treatment (dates unknown)    Patient Active Problem List   Diagnosis Date Noted  . Withdrawal complaint 05/12/2017  . Symptomatic bradycardia 05/12/2017  . Acute kidney injury (HCC) 08/18/2016  . Boils 08/18/2016  . Rhabdomyolysis 08/18/2016  . Urinary retention 08/18/2016  . ARF (acute renal failure) (HCC) 08/18/2016  . AKI (acute kidney injury) (HCC) 08/17/2016  . Hallucination 08/17/2016  . Nausea & vomiting 10/04/2015  . Pruritic dermatitis 09/10/2015  . Rash and nonspecific skin eruption 08/21/2015  . Body lice 08/05/2015  . Prolonged QT interval 07/18/2015  . Dysmenorrhea  04/10/2015  . Trichimoniasis 04/10/2015  . Chest pain 03/07/2015  . Healthcare maintenance 03/07/2015  . RLQ abdominal pain 02/18/2012  . GERD (gastroesophageal reflux disease) 01/28/2012  . Menorrhagia 01/25/2012  . Weakness generalized 12/05/2011  . Tobacco abuse 11/06/2011  . HTN (hypertension) 10/22/2011  . Abscess and cellulitis 10/22/2011  . Hepatitis C, chronic (HCC) 10/22/2011  . History of TB (tuberculosis) 10/22/2011  . Opioid dependence (HCC)   . Polysubstance abuse 04/17/2011    Past Surgical History:  Procedure Laterality Date  . CESAREAN SECTION  1984  . DILATION AND CURETTAGE OF UTERUS    . I&D EXTREMITY  10/18/2011   Procedure: IRRIGATION AND DEBRIDEMENT EXTREMITY;  Surgeon: Sharma Covert, MD;  Location: [facility] OR;  Service: Orthopedics;  Laterality: Left;  . I&D EXTREMITY  10/21/2011   Procedure: IRRIGATION AND DEBRIDEMENT EXTREMITY;  Surgeon: Sharma Covert, MD;  Location: [facility] OR;  Service: Orthopedics;  Laterality: Left;  . SALPINGECTOMY Left March 2010   ectopic pregnancy    OB History    No data available       Home Medications    Prior to Admission medications   Medication Sig Start Date End Date Taking? Authorizing Provider  buprenorphine (SUBUTEX) 2 MG SUBL SL tablet Place 8 mg under the tongue 2 (two) times daily. 05/18/17  Yes [provider]  cloNIDine (CATAPRES) 0.1 MG tablet Take 0.1 mg by mouth at bedtime.   Yes [provider]  cyclobenzaprine (FLEXERIL) 10 MG tablet Take 10 mg by mouth daily.   Yes [provider]  famotidine (PEPCID) 20 MG tablet Take 20 mg by mouth 2 (two) times daily.   Yes [provider]  ferrous sulfate 325 (65 FE) MG tablet Take 325 mg by mouth daily with breakfast.   Yes [provider]  gabapentin (NEURONTIN) 300 MG capsule Take 300 mg by mouth 2 (two) times daily as needed (pain).    Yes [provider]  hydrOXYzine (ATARAX/VISTARIL) 25 MG tablet Take 1 tablet (25 mg  total) by mouth every 8 (eight) hours as needed for itching (for severe itching.). Patient taking differently: Take 25 mg by mouth 2 (two) times daily.  07/27/16  Yes Ardith Dark, MD  lisinopril (PRINIVIL,ZESTRIL) 20 MG tablet Take 1 tablet (20 mg total) by mouth daily. 04/02/16  Yes Ardith Dark, MD  montelukast (SINGULAIR) 10 MG tablet Take 10 mg by mouth at bedtime.   Yes [provider]  polyethylene glycol powder (GLYCOLAX/MIRALAX) powder Take 34 g by mouth 2 (two) times daily as needed. Take until you have a bowel movement. Patient taking differently: Take 17-34 g by mouth 2 (two) times daily as needed (for constipation.).  11/12/15  Yes Melancon, Hillery Hunter, MD  amLODipine (NORVASC) 10 MG tablet Take 1 tablet (10 mg total) by mouth daily. Patient not taking: Reported on 06/06/2017 07/27/16   Ardith Dark, MD  camphor-menthol [facility]) lotion Apply 1 application topically as needed for itching. Patient not taking: Reported on 05/12/2017 07/27/16   Ardith Dark, MD  cetirizine (ZYRTEC) 10 MG tablet Take 1 tablet (10 mg total) by mouth daily. Patient not taking: Reported on 06/06/2017 07/27/16   Ardith Dark, MD  hydrochlorothiazide (HYDRODIURIL) 25 MG tablet Take 1 tablet (25 mg total) by mouth daily. Patient not taking: Reported on 05/12/2017 04/02/16   Ardith Dark, MD  methocarbamol (ROBAXIN) 500 MG tablet Take 1 tablet (500 mg total) by mouth 2 (two) times daily. Patient not taking: Reported on 05/12/2017 07/27/16   Jaynie Crumble, PA-C  naproxen (NAPROSYN) 500 MG tablet Take 1 tablet (500 mg total) by mouth 2 (two) times daily. Patient not taking: Reported on 05/12/2017 11/26/16   Alvina Chou, [place]  neomycin-bacitracin-polymyxin (NEOSPORIN) OINT Apply 1 application topically 3 (three) times daily. Patient not taking: Reported on 05/12/2017 08/19/16   Osvaldo Shipper, MD  nicotine (NICODERM CQ - DOSED IN MG/24 HOURS) 21 mg/24hr patch Place 1 patch (21 mg total) onto  the skin daily. Patient not taking: Reported on 05/12/2017 08/20/16   Osvaldo Shipper, MD  triamcinolone cream (KENALOG) 0.1 % Apply 1 application topically 2 (two) times daily. Patient not taking: Reported on 08/17/2016 08/15/16   Bethel Born, PA-C    Family History Family History  Problem Relation Age of Onset  . Heart attack Mother   . Heart attack Father   . Heart attack Paternal Grandmother   . Heart attack Paternal Grandfather   . Cirrhosis Brother     Social History Social History  Substance Use Topics  . Smoking status: Current Every Day Smoker    Packs/day: 0.50    Years: 32.00    Types: Cigarettes  . Smokeless tobacco: Never Used  . Alcohol use 0.0 oz/week     Comment: occ     Allergies   Patient has  no known allergies.   Review of Systems Review of Systems  All other systems reviewed and are negative.    Physical Exam Updated Vital Signs BP (!) 137/106   Pulse 73   Temp 97.9 F (36.6 C) (Oral)   Resp 14   Ht 1.6 m ( )   Wt 63.5 kg (140 lb)   LMP 12/06/2015   SpO2 97%   BMI 24.80 kg/m   Physical Exam  Constitutional: She appears well-developed and well-nourished. No distress.  HENT:  Head: Normocephalic and atraumatic.  Right Ear: External ear normal.  Left Ear: External ear normal.  Eyes: Conjunctivae are normal. Right eye exhibits no discharge. Left eye exhibits no discharge. No scleral icterus.  Neck: Neck supple. No tracheal deviation present.  Cardiovascular: Normal rate, regular rhythm and intact distal pulses.   Pulmonary/Chest: Effort normal and breath sounds normal. No stridor. No respiratory distress. She has no wheezes. She has no rales.  Abdominal: Soft. Bowel sounds are normal. She exhibits no distension. There is no tenderness. There is no rebound and no guarding.  Musculoskeletal: She exhibits no edema or tenderness.  Neurological: She is alert. She has normal strength. No cranial nerve deficit (no facial droop,  extraocular movements intact, no slurred speech) or sensory deficit. She exhibits normal muscle tone. She displays no seizure activity. Coordination normal.  Skin: Skin is warm and dry. No rash noted.  Psychiatric: She has a normal mood and affect.  Nursing note and vitals reviewed.    ED Treatments / Results  Labs (all labs ordered are listed, but only abnormal results are displayed) Labs Reviewed  BASIC METABOLIC PANEL - Abnormal; Notable for the following:       Result Value   Glucose, Bld 101 (*)    All other components within normal limits  CBC - Abnormal; Notable for the following:    Hemoglobin 11.8 (*)    All other components within normal limits  I-STAT TROPONIN, ED    EKG  EKG Interpretation  Date/Time:  Sunday June 06 2017 13:37:38 EDT Ventricular Rate:  78 PR Interval:    QRS Duration: 84 QT Interval:  415 QTC Calculation: 473 R Axis:   31 Text Interpretation:  Sinus rhythm Since last tracing rate slower Confirmed by Linwood Dibbles [phone]) on 06/06/2017 1:42:26 PM Also confirmed by Linwood Dibbles [phone]), editor Madalyn Rob [phone])  on 06/06/2017 2:13:51 PM       Radiology Dg Chest 2 View  Result Date: 06/06/2017 CLINICAL DATA:  Chest pain chest radiograph 05/12/2017 EXAM: CHEST  2 VIEW COMPARISON:  None. FINDINGS: The heart size and mediastinal contours are within normal limits. Both lungs are clear. The visualized skeletal structures are unremarkable. IMPRESSION: No active cardiopulmonary disease. Electronically Signed   By: Deatra Robinson M.D.   On: 06/06/2017 14:11    Procedures Procedures (including critical care time)  Medications Ordered in ED Medications  morphine 4 MG/ML injection 4 mg (4 mg Intravenous Given 06/06/17 1511)     Initial Impression / Assessment and Plan / ED Course  I have reviewed the triage vital signs and the nursing notes.  Pertinent labs & imaging results that were available during my care of the patient were reviewed by me  and considered in my medical decision making (see chart for details).  Clinical Course as of Jun 06 1600  Sun Jun 06, 2017  1414 RBC: 4.15 [JK]  1432 Low risk for PE, Wells criteria (0 points).  PERC negative  [JK]  Clinical Course User Index [JK] Linwood Dibbles, MD    Initial labs are normal.  Low risk for PE.  Doubt PE, perc negative.  Sx atypical for ACS.   Will check delta troponin.  If negative , stable for discharge, outpatient follow up.  Final Clinical Impressions(s) / ED Diagnoses   Final diagnoses:  Chest pain, unspecified type    New Prescriptions New Prescriptions   No medications on file     Linwood Dibbles, MD 06/06/17 1601

## 2017-06-06 NOTE — ED Notes (Signed)
Patient transported to X-ray 

## 2017-06-14 ENCOUNTER — Emergency Department (HOSPITAL_COMMUNITY)
Admission: EM | Admit: 2017-06-14 | Discharge: 2017-06-14 | Disposition: A | Discharge status: Home / Self Care | Payer: No Typology Code available for payment source

## 2017-06-14 NOTE — ED Notes (Signed)
Called no answer

## 2017-06-14 NOTE — ED Notes (Signed)
Call and no answer
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
## 2017-12-30 ENCOUNTER — Emergency Department (HOSPITAL_COMMUNITY)
Admission: EM | Admit: 2017-12-30 | Discharge: 2017-12-31 | Disposition: A | Discharge status: Home / Self Care | Attending: Emergency Medicine | Admitting: Emergency Medicine

## 2017-12-30 DIAGNOSIS — F17210 Nicotine dependence, cigarettes, uncomplicated: Secondary | ICD-10-CM | POA: Diagnosis not present

## 2017-12-30 DIAGNOSIS — F1410 Cocaine abuse, uncomplicated: Secondary | ICD-10-CM | POA: Diagnosis not present

## 2017-12-30 DIAGNOSIS — F329 Major depressive disorder, single episode, unspecified: Secondary | ICD-10-CM | POA: Diagnosis not present

## 2017-12-30 DIAGNOSIS — F1120 Opioid dependence, uncomplicated: Secondary | ICD-10-CM | POA: Insufficient documentation

## 2017-12-30 DIAGNOSIS — R0789 Other chest pain: Secondary | ICD-10-CM | POA: Insufficient documentation

## 2017-12-30 DIAGNOSIS — I10 Essential (primary) hypertension: Secondary | ICD-10-CM | POA: Insufficient documentation

## 2017-12-30 DIAGNOSIS — R51 Headache: Secondary | ICD-10-CM | POA: Diagnosis not present

## 2017-12-30 DIAGNOSIS — Z79899 Other long term (current) drug therapy: Secondary | ICD-10-CM | POA: Insufficient documentation

## 2017-12-31 ENCOUNTER — Other Ambulatory Visit: Payer: Self-pay

## 2017-12-31 ENCOUNTER — Emergency Department (HOSPITAL_COMMUNITY)

## 2017-12-31 LAB — BASIC METABOLIC PANEL
Anion gap: 8 (ref 5–15)
BUN: 22 mg/dL — ABNORMAL HIGH (ref 6–20)
CALCIUM: 9.3 mg/dL (ref 8.9–10.3)
CO2: 24 mmol/L (ref 22–32)
CREATININE: 0.67 mg/dL (ref 0.44–1.00)
Chloride: 105 mmol/L (ref 101–111)
GFR calc non Af Amer: >60 mL/min (ref >60)
GLUCOSE: 102 mg/dL — AB (ref 65–99)
Potassium: 3.9 mmol/L (ref 3.5–5.1)
Sodium: 137 mmol/L (ref 135–145)

## 2017-12-31 LAB — I-STAT BETA HCG BLOOD, ED (MC, WL, AP ONLY)

## 2017-12-31 LAB — CBC
HCT: 39.2 % (ref 36.0–46.0)
Hemoglobin: 13 g/dL (ref 12.0–15.0)
MCH: 30 pg (ref 26.0–34.0)
MCHC: 33.2 g/dL (ref 30.0–36.0)
MCV: 90.3 fL (ref 78.0–100.0)
PLATELETS: 240 10*3/uL (ref 150–400)
RBC: 4.34 MIL/uL (ref 3.87–5.11)
RDW: 14.7 % (ref 11.5–15.5)
WBC: 8.4 10*3/uL (ref 4.0–10.5)

## 2017-12-31 LAB — I-STAT TROPONIN, ED: TROPONIN I, POC: 0 ng/mL (ref 0.00–0.08)

## 2017-12-31 NOTE — ED Triage Notes (Signed)
[facility] reported persistant elevated blood pressure. Patient is on HCTZ, clonidine and lisinopril. She is taking them as scheduled.

## 2017-12-31 NOTE — ED Notes (Signed)
ED Provider at bedside. 
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
## 2017-12-31 NOTE — ED Provider Notes (Signed)
[facility]-EMERGENCY DEPT Provider Note   CSN: [id number] Arrival date & time: 12/30/17  2359     History   Chief Complaint Chief Complaint  Patient presents with  . Hypertension    HPI Ariana Jensen is a 52 y.o. female.  Patient presents with concern for uncontrolled high blood pressure, headache and chest discomfort. She takes Lisinopril in the mornings and Clonidine 0.2 mg at night and has been compliant. Tonight she got a frontal headache and felt this was due to blood pressure that was found to be [facility]. An additional 0.1 mg Clonidine was given and the patient was transported. She reports chest pain has been present over the last couple of day. No significant SOB, nausea, diarrhea.  No cough or fever. No history of heart disease. Headache is improved now, while the chest tightness continues.   The history is provided by the patient. No language interpreter was used.    Past Medical History:  Diagnosis Date  . Anxiety   . ASCUS (atypical squamous cells of undetermined significance) on Pap smear   . Chronic lower back pain   . Cocaine abuse    crack cocaine  . Depression   . Fibromyalgia   . GERD (gastroesophageal reflux disease)   . Hepatitis C   . Hepatitis C antibody test positive   . Hypertension   . Opioid dependence (HCC)   . Seizures (HCC)    "don't know why; I've had 2 in the past year" (08/18/2016)  . Trichomonas 04/17/2011   Diagnosed 04/02/11 during hospitalization, treated with Flagyl 2g, patient instructed to have partner treated (must follow-up)   . Tuberculosis    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 age 52, now s/p 1-yr of multidrug treatment (dates unknown)    Patient Active Problem List   Diagnosis Date Noted  . Withdrawal complaint 05/12/2017  . Symptomatic bradycardia 05/12/2017  . Acute kidney injury (HCC) 08/18/2016  . Boils 08/18/2016  . Rhabdomyolysis 08/18/2016  . Urinary  retention 08/18/2016  . ARF (acute renal failure) (HCC) 08/18/2016  . AKI (acute kidney injury) (HCC) 08/17/2016  . Hallucination 08/17/2016  . Nausea & vomiting 10/04/2015  . Pruritic dermatitis 09/10/2015  . Rash and nonspecific skin eruption 08/21/2015  . Body lice 08/05/2015  . Prolonged QT interval 07/18/2015  . Dysmenorrhea 04/10/2015  . Trichimoniasis 04/10/2015  . Chest pain 03/07/2015  . Healthcare maintenance 03/07/2015  . RLQ abdominal pain 02/18/2012  . GERD (gastroesophageal reflux disease) 01/28/2012  . Menorrhagia 01/25/2012  . Weakness generalized 12/05/2011  . Tobacco abuse 11/06/2011  . HTN (hypertension) 10/22/2011  . Abscess and cellulitis 10/22/2011  . Hepatitis C, chronic (HCC) 10/22/2011  . History of TB (tuberculosis) 10/22/2011  . Opioid dependence (HCC)   . Polysubstance abuse (HCC) 04/17/2011    Past Surgical History:  Procedure Laterality Date  . CESAREAN SECTION  1984  . DILATION AND CURETTAGE OF UTERUS    . I&D EXTREMITY  10/18/2011   Procedure: IRRIGATION AND DEBRIDEMENT EXTREMITY;  Surgeon: Sharma CovertFred W Ortmann, MD;  Location: [facility] OR;  Service: Orthopedics;  Laterality: Left;  . I&D EXTREMITY  10/21/2011   Procedure: IRRIGATION AND DEBRIDEMENT EXTREMITY;  Surgeon: Sharma CovertFred W Ortmann, MD;  Location: [facility] OR;  Service: Orthopedics;  Laterality: Left;  . SALPINGECTOMY Left March 2010   ectopic pregnancy     OB History   None      Home Medications    Prior to Admission medications  Medication Sig Start Date End Date Taking? Authorizing Provider  acetaminophen (TYLENOL) 325 MG tablet Take 650 mg by mouth 3 (three) times daily.   Yes [provider]  busPIRone (BUSPAR) 15 MG tablet Take 15 mg by mouth 2 (two) times daily.   Yes [provider]  cetirizine (ZYRTEC) 10 MG tablet Take 1 tablet (10 mg total) by mouth daily. 07/27/16  Yes Ardith Dark, MD  cloNIDine (CATAPRES) 0.2 MG tablet Take 0.2 mg by mouth 3 (three) times daily.   Yes  [provider]  ferrous sulfate 325 (65 FE) MG tablet Take 325 mg by mouth daily with breakfast.   Yes [provider]  hydrochlorothiazide (HYDRODIURIL) 25 MG tablet Take 1 tablet (25 mg total) by mouth daily. 04/02/16  Yes Ardith Dark, MD  hydrOXYzine (VISTARIL) 50 MG capsule Take 50 mg by mouth 2 (two) times daily.   Yes [provider]  lamoTRIgine (LAMICTAL) 25 MG tablet Take 25 mg by mouth 2 (two) times daily.   Yes [provider]  lisinopril (PRINIVIL,ZESTRIL) 20 MG tablet Take 1 tablet (20 mg total) by mouth daily. 04/02/16  Yes Ardith Dark, MD  amLODipine (NORVASC) 10 MG tablet Take 1 tablet (10 mg total) by mouth daily. Patient not taking: Reported on 06/06/2017 07/27/16   Ardith Dark, MD  camphor-menthol [facility]) lotion Apply 1 application topically as needed for itching. Patient not taking: Reported on 05/12/2017 07/27/16   Ardith Dark, MD  hydrOXYzine (ATARAX/VISTARIL) 25 MG tablet Take 1 tablet (25 mg total) by mouth every 8 (eight) hours as needed for itching (for severe itching.). Patient not taking: Reported on 12/31/2017 07/27/16   Ardith Dark, MD  methocarbamol (ROBAXIN) 500 MG tablet Take 1 tablet (500 mg total) by mouth 2 (two) times daily. Patient not taking: Reported on 05/12/2017 07/27/16   Jaynie Crumble, PA-C  naproxen (NAPROSYN) 500 MG tablet Take 1 tablet (500 mg total) by mouth 2 (two) times daily. Patient not taking: Reported on 05/12/2017 11/26/16   Alvina Chou, [place]  neomycin-bacitracin-polymyxin (NEOSPORIN) OINT Apply 1 application topically 3 (three) times daily. Patient not taking: Reported on 05/12/2017 08/19/16   Osvaldo Shipper, MD  nicotine (NICODERM CQ - DOSED IN MG/24 HOURS) 21 mg/24hr patch Place 1 patch (21 mg total) onto the skin daily. Patient not taking: Reported on 05/12/2017 08/20/16   Osvaldo Shipper, MD  polyethylene glycol powder (GLYCOLAX/MIRALAX) powder Take 34 g by mouth 2 (two) times  daily as needed. Take until you have a bowel movement. Patient not taking: Reported on 12/31/2017 11/12/15   Melancon, Hillery Hunter, MD  triamcinolone cream (KENALOG) 0.1 % Apply 1 application topically 2 (two) times daily. Patient not taking: Reported on 08/17/2016 08/15/16   Bethel Born, PA-C    Family History Family History  Problem Relation Age of Onset  . Heart attack Mother   . Heart attack Father   . Heart attack Paternal Grandmother   . Heart attack Paternal Grandfather   . Cirrhosis Brother     Social History Social History   Tobacco Use  . Smoking status: Current Every Day Smoker    Packs/day: 0.50    Years: 32.00    Pack years: 16.00    Types: Cigarettes  . Smokeless tobacco: Never Used  Substance Use Topics  . Alcohol use: Yes    Alcohol/week: 0.0 oz    Comment: occ  . Drug use: Yes    Types: Oxycodone, Heroin, Cocaine  Allergies   Patient has no known allergies.   Review of Systems Review of Systems  Constitutional: Negative for chills, diaphoresis and fever.  HENT: Negative.   Respiratory: Positive for chest tightness. Negative for cough and shortness of breath.   Gastrointestinal: Negative.  Negative for abdominal pain, nausea and vomiting.  Musculoskeletal: Negative.   Skin: Negative.   Neurological: Positive for headaches. Negative for weakness and light-headedness.     Physical Exam Updated Vital Signs BP 126/78   Pulse (!) 46   Temp 98.3 F (36.8 C) (Oral)   Resp 15   Ht 5\' 3"  (1.6 m)   Wt 44 kg (97 lb)   LMP 12/06/2015   SpO2 100%   BMI 17.18 kg/m   Physical Exam  Constitutional: She is oriented to person, place, and time. She appears well-developed and well-nourished.  HENT:  Head: Normocephalic.  Neck: Normal range of motion. Neck supple.  Cardiovascular: Normal rate and regular rhythm.  No murmur heard. Pulmonary/Chest: Effort normal and breath sounds normal. She has no wheezes. She has no rales. She exhibits tenderness  (Left parasternal chest tenderness that reproduces pain of complaint).  Abdominal: Soft. Bowel sounds are normal. There is no tenderness. There is no rebound and no guarding.  Musculoskeletal: Normal range of motion. She exhibits no edema.  Neurological: She is alert and oriented to person, place, and time.  CN's 3-12 grossly intact. Speech is clear and focused. No facial asymmetry. No lateralizing weakness. Reflexes are equal. No deficits of coordination. Ambulatory without imbalance.    Skin: Skin is warm and dry. No rash noted.  Psychiatric: She has a normal mood and affect.     ED Treatments / Results  Labs (all labs ordered are listed, but only abnormal results are displayed) Labs Reviewed  BASIC METABOLIC PANEL - Abnormal; Notable for the following components:      Result Value   Glucose, Bld 102 (*)    BUN 22 (*)    All other components within normal limits  CBC  I-STAT TROPONIN, ED  I-STAT BETA HCG BLOOD, ED ([facility], [facility], [facility] ONLY)    EKG EKG Interpretation  Date/Time:  Friday December 31 2017 00:13:15 EDT Ventricular Rate:  49 PR Interval:    QRS Duration: 93 QT Interval:  527 QTC Calculation: 476 R Axis:   56 Text Interpretation:  Sinus bradycardia Consider left ventricular hypertrophy Anterior Q waves, possibly due to LVH Baseline wander in lead(s) I III aVL aVF V1 V2 V5 Rate slower Confirmed by Glynn Octave [phone]) on 12/31/2017 12:34:06 AM   Radiology Dg Chest 2 View  Result Date: 12/31/2017 CLINICAL DATA:  Chest pain. EXAM: CHEST - 2 VIEW COMPARISON:  06/06/2017 FINDINGS: The cardiomediastinal contours are normal. The lungs are clear. Pulmonary vasculature is normal. No consolidation, pleural effusion, or pneumothorax. No acute osseous abnormalities are seen. IMPRESSION: No acute pulmonary process. Electronically Signed   By: Rubye Oaks M.D.   On: 12/31/2017 02:21    Procedures Procedures (including critical care time)  Medications Ordered in  ED Medications - No data to display   Initial Impression / Assessment and Plan / ED Course  I have reviewed the triage vital signs and the nursing notes.  Pertinent labs & imaging results that were available during my care of the patient were reviewed by me and considered in my medical decision making (see chart for details).     Patient is here for evaluation of high blood pressure, headache and chest pain.  Blood pressure improves throughout duration of ED encounter without intervention here. EKG has no ischemic changes. Troponin negative. Chest pain is reproducible and has been present greater than 24 hours. Delta troponin is not felt necessary to rule out ACS.  Headache is resolved. No neurologic changes or deficits.   The patient is having some transient bradycardia, likely due to additional clonidine given prior to arrival. Asymptomatic.   She is felt stable for discharge and will be returned to [facility]. Medical staff at the jail will monitor blood pressure and manage accordingly.   Final Clinical Impressions(s) / ED Diagnoses   Final diagnoses:  None   1. HTN 2. Nonspecific headache 3. Chest wall pain  ED Discharge Orders    None       Danne Harbor 01/02/18 2201    Glynn Octave, MD 01/03/18 [phone]

## 2017-12-31 NOTE — ED Notes (Signed)
Bed: WA08 Expected date:  Expected time:  Means of arrival:  Comments: 

## 2018-02-10 ENCOUNTER — Encounter (HOSPITAL_COMMUNITY): Payer: Self-pay | Admitting: Emergency Medicine

## 2018-02-10 ENCOUNTER — Emergency Department (HOSPITAL_COMMUNITY): Payer: Self-pay

## 2018-02-10 ENCOUNTER — Inpatient Hospital Stay (HOSPITAL_COMMUNITY): Payer: Self-pay

## 2018-02-10 ENCOUNTER — Inpatient Hospital Stay (HOSPITAL_COMMUNITY)
Admission: EM | Admit: 2018-02-10 | Discharge: 2018-02-13 | DRG: 683 | Discharge status: Against Medical Advice | Payer: Self-pay | Attending: Family Medicine | Admitting: Family Medicine

## 2018-02-10 DIAGNOSIS — R109 Unspecified abdominal pain: Secondary | ICD-10-CM

## 2018-02-10 DIAGNOSIS — R339 Retention of urine, unspecified: Secondary | ICD-10-CM | POA: Diagnosis present

## 2018-02-10 DIAGNOSIS — Z8249 Family history of ischemic heart disease and other diseases of the circulatory system: Secondary | ICD-10-CM

## 2018-02-10 DIAGNOSIS — Z5321 Procedure and treatment not carried out due to patient leaving prior to being seen by health care provider: Secondary | ICD-10-CM | POA: Diagnosis present

## 2018-02-10 DIAGNOSIS — R748 Abnormal levels of other serum enzymes: Secondary | ICD-10-CM | POA: Diagnosis present

## 2018-02-10 DIAGNOSIS — F1124 Opioid dependence with opioid-induced mood disorder: Secondary | ICD-10-CM

## 2018-02-10 DIAGNOSIS — Z79899 Other long term (current) drug therapy: Secondary | ICD-10-CM

## 2018-02-10 DIAGNOSIS — Z8611 Personal history of tuberculosis: Secondary | ICD-10-CM

## 2018-02-10 DIAGNOSIS — M6282 Rhabdomyolysis: Secondary | ICD-10-CM | POA: Diagnosis present

## 2018-02-10 DIAGNOSIS — D72829 Elevated white blood cell count, unspecified: Secondary | ICD-10-CM | POA: Diagnosis present

## 2018-02-10 DIAGNOSIS — G8929 Other chronic pain: Secondary | ICD-10-CM | POA: Diagnosis present

## 2018-02-10 DIAGNOSIS — K219 Gastro-esophageal reflux disease without esophagitis: Secondary | ICD-10-CM | POA: Diagnosis present

## 2018-02-10 DIAGNOSIS — W19XXXA Unspecified fall, initial encounter: Secondary | ICD-10-CM | POA: Diagnosis present

## 2018-02-10 DIAGNOSIS — D649 Anemia, unspecified: Secondary | ICD-10-CM | POA: Diagnosis present

## 2018-02-10 DIAGNOSIS — M797 Fibromyalgia: Secondary | ICD-10-CM | POA: Diagnosis present

## 2018-02-10 DIAGNOSIS — E872 Acidosis: Secondary | ICD-10-CM | POA: Diagnosis present

## 2018-02-10 DIAGNOSIS — E860 Dehydration: Secondary | ICD-10-CM | POA: Diagnosis present

## 2018-02-10 DIAGNOSIS — F329 Major depressive disorder, single episode, unspecified: Secondary | ICD-10-CM | POA: Diagnosis present

## 2018-02-10 DIAGNOSIS — R338 Other retention of urine: Secondary | ICD-10-CM | POA: Diagnosis present

## 2018-02-10 DIAGNOSIS — I10 Essential (primary) hypertension: Secondary | ICD-10-CM | POA: Diagnosis present

## 2018-02-10 DIAGNOSIS — R7989 Other specified abnormal findings of blood chemistry: Secondary | ICD-10-CM

## 2018-02-10 DIAGNOSIS — Z716 Tobacco abuse counseling: Secondary | ICD-10-CM

## 2018-02-10 DIAGNOSIS — M545 Low back pain: Secondary | ICD-10-CM | POA: Diagnosis present

## 2018-02-10 DIAGNOSIS — N179 Acute kidney failure, unspecified: Principal | ICD-10-CM | POA: Diagnosis present

## 2018-02-10 DIAGNOSIS — I959 Hypotension, unspecified: Secondary | ICD-10-CM | POA: Diagnosis present

## 2018-02-10 DIAGNOSIS — F17210 Nicotine dependence, cigarettes, uncomplicated: Secondary | ICD-10-CM | POA: Diagnosis present

## 2018-02-10 DIAGNOSIS — F1120 Opioid dependence, uncomplicated: Secondary | ICD-10-CM | POA: Diagnosis present

## 2018-02-10 DIAGNOSIS — Z720 Tobacco use: Secondary | ICD-10-CM | POA: Diagnosis present

## 2018-02-10 DIAGNOSIS — F1410 Cocaine abuse, uncomplicated: Secondary | ICD-10-CM | POA: Diagnosis present

## 2018-02-10 DIAGNOSIS — S82832A Other fracture of upper and lower end of left fibula, initial encounter for closed fracture: Secondary | ICD-10-CM | POA: Diagnosis present

## 2018-02-10 DIAGNOSIS — R945 Abnormal results of liver function studies: Secondary | ICD-10-CM

## 2018-02-10 DIAGNOSIS — F1910 Other psychoactive substance abuse, uncomplicated: Secondary | ICD-10-CM | POA: Diagnosis present

## 2018-02-10 DIAGNOSIS — T40601A Poisoning by unspecified narcotics, accidental (unintentional), initial encounter: Secondary | ICD-10-CM | POA: Diagnosis present

## 2018-02-10 DIAGNOSIS — F419 Anxiety disorder, unspecified: Secondary | ICD-10-CM | POA: Diagnosis present

## 2018-02-10 DIAGNOSIS — B182 Chronic viral hepatitis C: Secondary | ICD-10-CM | POA: Diagnosis present

## 2018-02-10 DIAGNOSIS — E861 Hypovolemia: Secondary | ICD-10-CM | POA: Diagnosis present

## 2018-02-10 DIAGNOSIS — K8020 Calculus of gallbladder without cholecystitis without obstruction: Secondary | ICD-10-CM | POA: Diagnosis present

## 2018-02-10 LAB — CBC WITH DIFFERENTIAL/PLATELET
Abs Immature Granulocytes: 0.1 10*3/uL (ref 0.0–0.1)
Basophils Absolute: 0 10*3/uL (ref 0.0–0.1)
Basophils Relative: 0 %
Eosinophils Absolute: 0 10*3/uL (ref 0.0–0.7)
Eosinophils Relative: 0 %
HEMATOCRIT: 33.9 % — AB (ref 36.0–46.0)
Hemoglobin: 10.8 g/dL — ABNORMAL LOW (ref 12.0–15.0)
Immature Granulocytes: 1 %
LYMPHS ABS: 0.9 10*3/uL (ref 0.7–4.0)
Lymphocytes Relative: 6 %
MCH: 29.3 pg (ref 26.0–34.0)
MCHC: 31.9 g/dL (ref 30.0–36.0)
MCV: 91.9 fL (ref 78.0–100.0)
MONOS PCT: 10 %
Monocytes Absolute: 1.5 10*3/uL — ABNORMAL HIGH (ref 0.1–1.0)
NEUTROS PCT: 83 %
Neutro Abs: 12.7 10*3/uL — ABNORMAL HIGH (ref 1.7–7.7)
Platelets: 221 10*3/uL (ref 150–400)
RBC: 3.69 MIL/uL — ABNORMAL LOW (ref 3.87–5.11)
RDW: 16 % — AB (ref 11.5–15.5)
WBC: 15.3 10*3/uL — AB (ref 4.0–10.5)

## 2018-02-10 LAB — I-STAT BETA HCG BLOOD, ED (MC, WL, AP ONLY): I-stat hCG, quantitative: <5 m[IU]/mL (ref <5)

## 2018-02-10 LAB — COMPREHENSIVE METABOLIC PANEL
ALBUMIN: 3.3 g/dL — AB (ref 3.5–5.0)
ALK PHOS: 53 U/L (ref 38–126)
ALT: 118 U/L — AB (ref 14–54)
ANION GAP: 16 — AB (ref 5–15)
AST: 255 U/L — ABNORMAL HIGH (ref 15–41)
BUN: 113 mg/dL — ABNORMAL HIGH (ref 6–20)
CALCIUM: 6.9 mg/dL — AB (ref 8.9–10.3)
CHLORIDE: 102 mmol/L (ref 101–111)
CO2: 17 mmol/L — AB (ref 22–32)
CREATININE: 6.37 mg/dL — AB (ref 0.44–1.00)
GFR calc non Af Amer: 7 mL/min — ABNORMAL LOW (ref >60)
GFR, EST AFRICAN AMERICAN: 8 mL/min — AB (ref >60)
GLUCOSE: 142 mg/dL — AB (ref 65–99)
Potassium: 5.5 mmol/L — ABNORMAL HIGH (ref 3.5–5.1)
SODIUM: 135 mmol/L (ref 135–145)
Total Bilirubin: 0.6 mg/dL (ref 0.3–1.2)
Total Protein: 6.9 g/dL (ref 6.5–8.1)

## 2018-02-10 LAB — TROPONIN I: Troponin I: 0.03 ng/mL (ref <0.03)

## 2018-02-10 LAB — CK: Total CK: 20277 U/L — ABNORMAL HIGH (ref 38–234)

## 2018-02-10 MED ORDER — SODIUM CHLORIDE 0.9 % IV BOLUS
1000.0000 mL | Freq: Once | INTRAVENOUS | Status: AC
  Administered 2018-02-10: 1000 mL via INTRAVENOUS

## 2018-02-10 MED ORDER — SODIUM CHLORIDE 0.9 % IV SOLN
INTRAVENOUS | Status: DC
  Administered 2018-02-10 – 2018-02-12 (×3): via INTRAVENOUS

## 2018-02-10 NOTE — ED Provider Notes (Signed)
[facility] EMERGENCY DEPARTMENT Provider Note   CSN: [id number] Arrival date & time: 02/10/18  2015     History   Chief Complaint Chief Complaint  Patient presents with  . Hypotension  . Loss of Consciousness    HPI Ariana Jensen is a 51 y.o. female.  HPI She presents with concern of possible syncope event, pain all over, left ankle pain, and decreased capacity.  Patient knowledge his multiple medical issues, including hypertension for which she states she takes for antihypertensive medications. She was also recently incarcerated, left present for days ago. She used heroin 2 days ago. She now presents with the after mentioned concerns, worsening over the past 2 days, after a possible fall. 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 Since that time she has had persistent weakness in particular.  Past Medical History:  Diagnosis Date  . Anxiety   . ASCUS (atypical squamous cells of undetermined significance) on Pap smear   . Chronic lower back pain   . Cocaine abuse (HCC)    crack cocaine  . Depression   . Fibromyalgia   . GERD (gastroesophageal reflux disease)   . Hepatitis C   . Hepatitis C antibody test positive   . Hypertension   . Opioid dependence (HCC)   . Seizures (HCC)    "don't know why; I've had 2 in the past year" (08/18/2016)  . Trichomonas 04/17/2011   Diagnosed 04/02/11 during hospitalization, treated with Flagyl 2g, patient instructed to have partner treated (must follow-up)   . Tuberculosis    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 age 74, now s/p 1-yr of multidrug treatment (dates unknown)    Patient Active Problem List   Diagnosis Date Noted  . Withdrawal complaint 05/12/2017  . Symptomatic bradycardia 05/12/2017  . Acute kidney injury (HCC) 08/18/2016  . Boils 08/18/2016  . Rhabdomyolysis 08/18/2016  . Urinary retention 08/18/2016  . ARF (acute renal failure) (HCC)  08/18/2016  . AKI (acute kidney injury) (HCC) 08/17/2016  . Hallucination 08/17/2016  . Nausea & vomiting 10/04/2015  . Pruritic dermatitis 09/10/2015  . Rash and nonspecific skin eruption 08/21/2015  . Body lice 08/05/2015  . Prolonged QT interval 07/18/2015  . Dysmenorrhea 04/10/2015  . Trichimoniasis 04/10/2015  . Chest pain 03/07/2015  . Healthcare maintenance 03/07/2015  . RLQ abdominal pain 02/18/2012  . GERD (gastroesophageal reflux disease) 01/28/2012  . Menorrhagia 01/25/2012  . Weakness generalized 12/05/2011  . Tobacco abuse 11/06/2011  . HTN (hypertension) 10/22/2011  . Abscess and cellulitis 10/22/2011  . Hepatitis C, chronic (HCC) 10/22/2011  . History of TB (tuberculosis) 10/22/2011  . Opioid dependence (HCC)   . Polysubstance abuse (HCC) 04/17/2011    Past Surgical History:  Procedure Laterality Date  . CESAREAN SECTION  1984  . DILATION AND CURETTAGE OF UTERUS    . I&D EXTREMITY  10/18/2011   Procedure: IRRIGATION AND DEBRIDEMENT EXTREMITY;  Surgeon: Sharma Covert, MD;  Location: [facility] OR;  Service: Orthopedics;  Laterality: Left;  . I&D EXTREMITY  10/21/2011   Procedure: IRRIGATION AND DEBRIDEMENT EXTREMITY;  Surgeon: Sharma Covert, MD;  Location: [facility] OR;  Service: Orthopedics;  Laterality: Left;  . SALPINGECTOMY Left March 2010   ectopic pregnancy     OB History   None      Home Medications    Prior to Admission medications   Medication Sig Start Date End Date Taking? Authorizing Provider  acetaminophen (TYLENOL) 325 MG tablet Take  325-650 mg by mouth every 8 (eight) hours as needed (for pain or headaches).    Yes [provider]  amLODipine (NORVASC) 10 MG tablet Take 1 tablet (10 mg total) by mouth daily. 07/27/16   Ardith Dark, MD  busPIRone (BUSPAR) 15 MG tablet Take 15 mg by mouth 2 (two) times daily.    [provider]  camphor-menthol Wynelle Fanny) lotion Apply 1 application topically as needed for itching. Patient not taking:  Reported on 02/10/2018 07/27/16   Ardith Dark, MD  cetirizine (ZYRTEC) 10 MG tablet Take 1 tablet (10 mg total) by mouth daily. Patient not taking: Reported on 02/10/2018 07/27/16   Ardith Dark, MD  cloNIDine (CATAPRES) 0.2 MG tablet Take 0.2 mg by mouth daily.     [provider]  divalproex (DEPAKOTE) 250 MG DR tablet Take 250 mg by mouth 2 (two) times daily.    [provider]  ferrous sulfate 325 (65 FE) MG tablet Take 325 mg by mouth daily with breakfast.    [provider]  gabapentin (NEURONTIN) 300 MG capsule Take 300 mg by mouth 2 (two) times daily.    [provider]  hydrochlorothiazide (HYDRODIURIL) 25 MG tablet Take 1 tablet (25 mg total) by mouth daily. 04/02/16   Ardith Dark, MD  hydrOXYzine (ATARAX/VISTARIL) 25 MG tablet Take 1 tablet (25 mg total) by mouth every 8 (eight) hours as needed for itching (for severe itching.). Patient not taking: Reported on 02/10/2018 07/27/16   Ardith Dark, MD  hydrOXYzine (VISTARIL) 50 MG capsule Take 50 mg by mouth 2 (two) times daily.    [provider]  lamoTRIgine (LAMICTAL) 25 MG tablet Take 25 mg by mouth 2 (two) times daily.    [provider]  lisinopril (PRINIVIL,ZESTRIL) 20 MG tablet Take 1 tablet (20 mg total) by mouth daily. Patient not taking: Reported on 02/10/2018 04/02/16   Ardith Dark, MD  lisinopril (PRINIVIL,ZESTRIL) 40 MG tablet Take 40 mg by mouth daily.    [provider]  methocarbamol (ROBAXIN) 500 MG tablet Take 1 tablet (500 mg total) by mouth 2 (two) times daily. Patient not taking: Reported on 02/10/2018 07/27/16   Jaynie Crumble, PA-C  naproxen (NAPROSYN) 500 MG tablet Take 1 tablet (500 mg total) by mouth 2 (two) times daily. Patient not taking: Reported on 02/10/2018 11/26/16   Alvina Chou, [place]  neomycin-bacitracin-polymyxin (NEOSPORIN) OINT Apply 1 application topically 3 (three) times daily. Patient not taking: Reported on  02/10/2018 08/19/16   Osvaldo Shipper, MD  nicotine (NICODERM CQ - DOSED IN MG/24 HOURS) 21 mg/24hr patch Place 1 patch (21 mg total) onto the skin daily. Patient not taking: Reported on 02/10/2018 08/20/16   Osvaldo Shipper, MD  polyethylene glycol powder (GLYCOLAX/MIRALAX) powder Take 34 g by mouth 2 (two) times daily as needed. Take until you have a bowel movement. Patient not taking: Reported on 02/10/2018 11/12/15   Melancon, Hillery Hunter, MD  risperiDONE (RISPERDAL) 1 MG tablet Take 1 mg by mouth 2 (two) times daily.    [provider]  triamcinolone cream (KENALOG) 0.1 % Apply 1 application topically 2 (two) times daily. Patient not taking: Reported on 02/10/2018 08/15/16   Bethel Born, PA-C    Family History Family History  Problem Relation Age of Onset  . Heart attack Mother   . Heart attack Father   . Heart attack Paternal Grandmother   . Heart attack Paternal Grandfather   . Cirrhosis Brother  Social History Social History   Tobacco Use  . Smoking status: Current Every Day Smoker    Packs/day: 0.50    Years: 32.00    Pack years: 16.00    Types: Cigarettes  . Smokeless tobacco: Never Used  Substance Use Topics  . Alcohol use: Yes    Alcohol/week: 0.0 oz    Comment: occ  . Drug use: Yes    Types: Oxycodone, Heroin, Cocaine, IV     Allergies   Patient has no known allergies.   Review of Systems Review of Systems  Constitutional:       Per HPI, otherwise negative  HENT:       Per HPI, otherwise negative  Respiratory:       Per HPI, otherwise negative  Cardiovascular:       Per HPI, otherwise negative  Gastrointestinal: Negative for vomiting.  Endocrine:       Negative aside from HPI  Genitourinary:       Neg aside from HPI   Musculoskeletal:       Per HPI, otherwise negative  Skin: Negative.  Negative for wound.  Neurological: Positive for syncope and weakness.  Psychiatric/Behavioral: The patient is nervous/anxious.        Patient  acknowledges using heroin within the past 24 hours, denies other illicit substance use     Physical Exam Updated Vital Signs BP 109/67   Pulse 94   Temp (!) 97.3 F (36.3 C) (Temporal)   Resp 20   Ht  (1.6 m)   LMP 12/06/2015   SpO2 97%   BMI 17.18 kg/m   Physical Exam  Constitutional: She is oriented to person, place, and time. No distress.  Sickly, frail middle-aged female gaunt, awake and alert, wearing a cervical collar.  HENT:  Head: Normocephalic and atraumatic.  Eyes: Conjunctivae and EOM are normal.  Cardiovascular: Normal rate and regular rhythm.  Pulmonary/Chest: Effort normal and breath sounds normal. No stridor. No respiratory distress.  Abdominal: She exhibits no distension.  Musculoskeletal: She exhibits no edema.  Neurological: She is alert and oriented to person, place, and time. No cranial nerve deficit.  Skin: Skin is warm and dry.  No obvious skin lesions  Psychiatric: Her mood appears anxious.  Nursing note and vitals reviewed.    ED Treatments / Results  Labs (all labs ordered are listed, but only abnormal results are displayed) Labs Reviewed  COMPREHENSIVE METABOLIC PANEL - Abnormal; Notable for the following components:      Result Value   Potassium 5.5 (*)    CO2 17 (*)    Glucose, Bld 142 (*)    BUN 113 (*)    Creatinine, Ser 6.37 (*)    Calcium 6.9 (*)    Albumin 3.3 (*)    AST 255 (*)    ALT 118 (*)    GFR calc non Af Amer 7 (*)    GFR calc Af Amer 8 (*)    Anion gap 16 (*)    All other components within normal limits  TROPONIN I - Abnormal; Notable for the following components:   Troponin I 0.03 (*)    All other components within normal limits  CBC WITH DIFFERENTIAL/PLATELET - Abnormal; Notable for the following components:   WBC 15.3 (*)    RBC 3.69 (*)    Hemoglobin 10.8 (*)    HCT 33.9 (*)    RDW 16.0 (*)    Neutro Abs 12.7 (*)    Monocytes Absolute 1.5 (*)  All other components within normal limits  RAPID URINE  DRUG SCREEN, HOSP PERFORMED  URINALYSIS, ROUTINE W REFLEX MICROSCOPIC  CK  I-STAT BETA HCG BLOOD, ED ([facility], [facility], [facility] ONLY)  CBG MONITORING, ED    EKG EKG Interpretation  Date/Time:  Thursday Feb 10 2018 20:19:15 EDT Ventricular Rate:  96 PR Interval:    QRS Duration: 87 QT Interval:  374 QTC Calculation: 473 R Axis:   36 Text Interpretation:  Sinus rhythm Normal ECG Confirmed by Gerhard Munch [phone]) on 02/10/2018 8:49:53 PM   Radiology Dg Chest 2 View  Result Date: 02/10/2018 CLINICAL DATA:  Syncope and fall from overdose. EXAM: CHEST - 2 VIEW COMPARISON:  12/31/2017 and prior radiographs FINDINGS: The cardiomediastinal silhouette is unremarkable. Mild bibasilar atelectasis noted. There is no evidence of focal airspace disease, pulmonary edema, suspicious pulmonary nodule/mass, pleural effusion, or pneumothorax. No acute bony abnormalities are identified. IMPRESSION: Mild bibasilar atelectasis. Electronically Signed   By: Harmon Pier M.D.   On: 02/10/2018 21:22   Dg Ankle Complete Left  Result Date: 02/10/2018 CLINICAL DATA:  Question fall from standing. Left lateral ankle pain and swelling. EXAM: LEFT ANKLE COMPLETE - 3+ VIEW COMPARISON:  None. FINDINGS: Oblique mildly displaced distal fibular fracture at the level of the ankle mortise. No mortise widening. Lateral soft tissue edema. No additional fracture of the ankle. Overall alignment is maintained. IMPRESSION: Oblique mildly displaced distal fibular fracture at the level of the ankle mortise. Electronically Signed   By: Rubye Oaks M.D.   On: 02/10/2018 21:23    Procedures Procedures (including critical care time)  Medications Ordered in ED Medications  0.9 %  sodium chloride infusion ( Intravenous New Bag/Given 02/10/18 2320)  sodium chloride 0.9 % bolus 1,000 mL (1,000 mLs Intravenous New Bag/Given 02/10/18 2339)  sodium chloride 0.9 % bolus 1,000 mL (0 mLs Intravenous Stopped 02/10/18 2231)     Initial Impression /  Assessment and Plan / ED Course  I have reviewed the triage vital signs and the nursing notes.  Pertinent labs & imaging results that were available during my care of the patient were reviewed by me and considered in my medical decision making (see chart for details).    Patient's initial blood pressure low, 75/55. She is receiving IV fluids, and EMS reports that she already received 1 L fluid in route, with some improvement in her numbers. They note that the patient was awake and alert, interactive throughout transport.   10:17 PM Remaining labs notable for elevated creatinine, patient's blood pressure has improved, but continues to run continuously given her hypotension, acute kidney injury. Patient has had splint applied to her left fibula fracture, by our orthopedic technician, this was well applied, tolerated.  On repeat exam the patient is awake alert, blood pressure continues to improve, she continues to receive fluids, with concern for acute kidney injury, episode of syncope, potentially due to her renal failure, hyperkalemia, the patient will require admission for further evaluation and management.   Final Clinical Impressions(s) / ED Diagnoses  Syncope Acute kidney injury Left fibula fracture, initial encounter  CRITICAL CARE Performed by: Gerhard Munch Total critical care time: 35 minutes Critical care time was exclusive of separately billable procedures and treating other patients. Critical care was necessary to treat or prevent imminent or life-threatening deterioration. Critical care was time spent personally by me on the following activities: development of treatment plan with patient and/or surrogate as well as nursing, discussions with consultants, evaluation of patient's response to treatment,  examination of patient, obtaining history from patient or surrogate, ordering and performing treatments and interventions, ordering and review of laboratory studies, ordering and  review of radiographic studies, pulse oximetry and re-evaluation of patient's condition.    Gerhard Munch, MD 02/10/18 2340

## 2018-02-10 NOTE — ED Notes (Signed)
Pt removed from bedpan and placed on bedside commode at pt's request. Pt was able to void after move. Total Output unknown at this time.

## 2018-02-10 NOTE — H&P (Signed)
Ariana Jensen:[id number] DOB: 11/14/1965 DOA: 02/10/2018     PCP: Lavinia Sharps, NP   Outpatient Specialists:   NONE   Patient arrived to ER on 02/10/18 at 2015  Patient coming from:      Lives in a car  For the past 2 days  Chief Complaint:  Chief Complaint  Patient presents with  . Hypotension  . Loss of Consciousness    HPI: Ariana Jensen is a 52 y.o. female with medical history significant of narcotic abuse, HTN heroin abuse, history of cocaine use, fibromyalgia, GERD, hepatitis C, history of seizure disorder    Presented with possible opioid overdose. Initially patient stated that she used heroin yesterday or  few days ago has been staying in her car had a fall yesterday reporting left ankle pain. Today she syncopized while putting in Gas She thinks she has been eating and drinking well but poor historian and unsure of what has been going on recently denies diarrhea. Reports her hands have been cold and tingly for days.    After syncopal event on EMS arrival blood pressure 71/44 and heart rate 110 initial CBG 200 she was given normal saline.   Reports was recently incarcerated since release has not been able to refill her medications.   Regarding pertinent Chronic problems: hx of heroin abuse Was in remission no prior hx of CKD   While in ER:  initially hypotensive systolics in the 70s and IV fluids now systolic 109  Following Medications were ordered in ER: Medications  0.9 %  sodium chloride infusion (has no administration in time range)  sodium chloride 0.9 % bolus 1,000 mL (1,000 mLs Intravenous New Bag/Given 02/10/18 2037)    Significant initial  Findings: Abnormal Labs Reviewed  COMPREHENSIVE METABOLIC PANEL - Abnormal; Notable for the following components:      Result Value   Potassium 5.5 (*)    CO2 17 (*)    Glucose, Bld 142 (*)    BUN 113 (*)    Creatinine, Ser 6.37 (*)    Calcium 6.9 (*)    Albumin 3.3 (*)    AST 255 (*)    ALT 118  (*)    GFR calc non Af Amer 7 (*)    GFR calc Af Amer 8 (*)    Anion gap 16 (*)    All other components within normal limits  TROPONIN I - Abnormal; Notable for the following components:   Troponin I 0.03 (*)    All other components within normal limits  CBC WITH DIFFERENTIAL/PLATELET - Abnormal; Notable for the following components:   WBC 15.3 (*)    RBC 3.69 (*)    Hemoglobin 10.8 (*)    HCT 33.9 (*)    RDW 16.0 (*)    Neutro Abs 12.7 (*)    Monocytes Absolute 1.5 (*)    All other components within normal limits     Na 135 K 5.5  Cr   Up from baseline see below Lab Results  Component Value Date   CREATININE 6.37 (H) 02/10/2018   CREATININE 0.67 12/31/2017   CREATININE 0.66 06/06/2017      WBC  15.3  HG/HCT   Down  from baseline see below    Component Value Date/Time   HGB 10.8 (L) 02/10/2018 2036   HCT 33.9 (L) 02/10/2018 2036     CK 20K  Troponin (Point of Care Test) 0.03   BNP (last 3 results) Recent Labs  05/12/17 1916  BNP 91.5     Lactic Acid, Venous No results found for: LATICACIDVEN    UA ordered   CXR -  NON acute   Left foot- mildly displaced distal fibular fracture at the level of the ankle mortise. ECG:  Personally reviewed by me showing: HR : 96 Rhythm: NSR, Ischemic changes*nonspecific changes, no evidence of ischemic changes QTC 473     ED Triage Vitals  Enc Vitals Group     BP 02/10/18 2021 (!) 78/39     Pulse Rate 02/10/18 2118 67     Resp 02/10/18 2021 17     Temp 02/10/18 2021 (!) 97.3 F (36.3 C)     Temp Source 02/10/18 2021 Temporal     SpO2 02/10/18 2145 97 %     Weight --      Height 02/10/18 2026  (1.6 m)     Head Circumference --      Peak Flow --      Pain Score --      Pain Loc --      Pain Edu? --      Excl. in GC? --   TMAX(24)@       Latest  Blood pressure 109/67, pulse 94, temperature (!) 97.3 F (36.3 C), temperature source Temporal, resp. rate 20, height  (1.6 m), last menstrual  period 12/06/2015, SpO2 97 %.     This Provider Called:    Nephrology  Dr.Patel  They Recommend rehydrate with NS re consult if renal function fails to improve  Hospitalist was called for admission for  ARF in the setting of severe dehydration, rhabdomyolysis opioid abuse   Review of Systems:    Pertinent positives include: lightheaded  Constitutional:  No weight loss, night sweats, Fevers, chills, fatigue, weight loss  HEENT:  No headaches, Difficulty swallowing,Tooth/dental problems,Sore throat,  No sneezing, itching, ear ache, nasal congestion, post nasal drip,  Cardio-vascular:  No chest pain, Orthopnea, PND, anasarca, dizziness, palpitations.no Bilateral lower extremity swelling  GI:  No heartburn, indigestion, abdominal pain, nausea, vomiting, diarrhea, change in bowel habits, loss of appetite, melena, blood in stool, hematemesis Resp:  no shortness of breath at rest. No dyspnea on exertion, No excess mucus, no productive cough, No non-productive cough, No coughing up of blood.No change in color of mucus.No wheezing. Skin:  no rash or lesions. No jaundice GU:  no dysuria, change in color of urine, no urgency or frequency. No straining to urinate.  No flank pain.  Musculoskeletal:  No joint pain or no joint swelling. No decreased range of motion. No back pain.  Psych:  No change in mood or affect. No depression or anxiety. No memory loss.  Neuro: no localizing neurological complaints, no tingling, no weakness, no double vision, no gait abnormality, no slurred speech, no confusion  As per HPI otherwise 10 point review of systems negative.   Past Medical History:   Past Medical History:  Diagnosis Date  . Anxiety   . ASCUS (atypical squamous cells of undetermined significance) on Pap smear   . Chronic lower back pain   . Cocaine abuse (HCC)    crack cocaine  . Depression   . Fibromyalgia   . GERD (gastroesophageal reflux disease)   . Hepatitis C   . Hepatitis C  antibody test positive   . Hypertension   . Opioid dependence (HCC)   . Seizures (HCC)    "don't know why; I've had 2 in the past year" (08/18/2016)  .  Trichomonas 04/17/2011   Diagnosed 04/02/11 during hospitalization, treated with Flagyl 2g, patient instructed to have partner treated (must follow-up)   . Tuberculosis    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 age 57, now s/p 1-yr of multidrug treatment (dates unknown)      Past Surgical History:  Procedure Laterality Date  . CESAREAN SECTION  1984  . DILATION AND CURETTAGE OF UTERUS    . I&D EXTREMITY  10/18/2011   Procedure: IRRIGATION AND DEBRIDEMENT EXTREMITY;  Surgeon: Sharma Covert, MD;  Location: [facility] OR;  Service: Orthopedics;  Laterality: Left;  . I&D EXTREMITY  10/21/2011   Procedure: IRRIGATION AND DEBRIDEMENT EXTREMITY;  Surgeon: Sharma Covert, MD;  Location: [facility] OR;  Service: Orthopedics;  Laterality: Left;  . SALPINGECTOMY Left March 2010   ectopic pregnancy    Social History:  Ambulatory   Independently     reports that she has been smoking cigarettes.  She has a 16.00 pack-year smoking history. She has never used smokeless tobacco. She reports that she drinks alcohol. She reports that she has current or past drug history. Drugs: Oxycodone, Heroin, Cocaine, and IV.     Family History:   Family History  Problem Relation Age of Onset  . Heart attack Mother   . Heart attack Father   . Heart attack Paternal Grandmother   . Heart attack Paternal Grandfather   . Cirrhosis Brother     Allergies: No Known Allergies   Prior to Admission medications   Medication Sig Start Date End Date Taking? Authorizing Provider  acetaminophen (TYLENOL) 325 MG tablet Take 325-650 mg by mouth every 8 (eight) hours as needed (for pain or headaches).    Yes [provider]  amLODipine (NORVASC) 10 MG tablet Take 1 tablet (10 mg total) by mouth daily. 07/27/16   Ardith Dark, MD  busPIRone (BUSPAR) 15 MG tablet Take 15 mg  by mouth 2 (two) times daily.    [provider]  camphor-menthol Wynelle Fanny) lotion Apply 1 application topically as needed for itching. Patient not taking: Reported on 02/10/2018 07/27/16   Ardith Dark, MD  cetirizine (ZYRTEC) 10 MG tablet Take 1 tablet (10 mg total) by mouth daily. Patient not taking: Reported on 02/10/2018 07/27/16   Ardith Dark, MD  cloNIDine (CATAPRES) 0.2 MG tablet Take 0.2 mg by mouth daily.     [provider]  divalproex (DEPAKOTE) 250 MG DR tablet Take 250 mg by mouth 2 (two) times daily.    [provider]  ferrous sulfate 325 (65 FE) MG tablet Take 325 mg by mouth daily with breakfast.    [provider]  gabapentin (NEURONTIN) 300 MG capsule Take 300 mg by mouth 2 (two) times daily.    [provider]  hydrochlorothiazide (HYDRODIURIL) 25 MG tablet Take 1 tablet (25 mg total) by mouth daily. 04/02/16   Ardith Dark, MD  hydrOXYzine (ATARAX/VISTARIL) 25 MG tablet Take 1 tablet (25 mg total) by mouth every 8 (eight) hours as needed for itching (for severe itching.). Patient not taking: Reported on 02/10/2018 07/27/16   Ardith Dark, MD  hydrOXYzine (VISTARIL) 50 MG capsule Take 50 mg by mouth 2 (two) times daily.    [provider]  lamoTRIgine (LAMICTAL) 25 MG tablet Take 25 mg by mouth 2 (two) times daily.    [provider]  lisinopril (PRINIVIL,ZESTRIL) 20 MG tablet Take 1 tablet (20 mg total) by mouth daily. Patient not taking: Reported on 02/10/2018 04/02/16  Ardith Dark, MD  lisinopril (PRINIVIL,ZESTRIL) 40 MG tablet Take 40 mg by mouth daily.    [provider]  methocarbamol (ROBAXIN) 500 MG tablet Take 1 tablet (500 mg total) by mouth 2 (two) times daily. Patient not taking: Reported on 02/10/2018 07/27/16   Jaynie Crumble, PA-C  naproxen (NAPROSYN) 500 MG tablet Take 1 tablet (500 mg total) by mouth 2 (two) times daily. Patient not taking: Reported on 02/10/2018 11/26/16   Alvina Chou, [place]  neomycin-bacitracin-polymyxin (NEOSPORIN) OINT Apply 1 application topically 3 (three) times daily. Patient not taking: Reported on 02/10/2018 08/19/16   Osvaldo Shipper, MD  nicotine (NICODERM CQ - DOSED IN MG/24 HOURS) 21 mg/24hr patch Place 1 patch (21 mg total) onto the skin daily. Patient not taking: Reported on 02/10/2018 08/20/16   Osvaldo Shipper, MD  polyethylene glycol powder (GLYCOLAX/MIRALAX) powder Take 34 g by mouth 2 (two) times daily as needed. Take until you have a bowel movement. Patient not taking: Reported on 02/10/2018 11/12/15   Melancon, Hillery Hunter, MD  risperiDONE (RISPERDAL) 1 MG tablet Take 1 mg by mouth 2 (two) times daily.    [provider]  triamcinolone cream (KENALOG) 0.1 % Apply 1 application topically 2 (two) times daily. Patient not taking: Reported on 02/10/2018 08/15/16   Bethel Born, PA-C   Physical Exam: Blood pressure 109/67, pulse 94, temperature (!) 97.3 F (36.3 C), temperature source Temporal, resp. rate 20, height  (1.6 m), last menstrual period 12/06/2015, SpO2 97 %. 1. General:  in No Acute distress   Chronically ill -appearing 2. Psychological: Alert and   Oriented 3. Head/ENT:    Dry Mucous Membranes                          Head Non traumatic, neck supple                            Poor Dentition 4. SKIN:  decreased Skin turgor,  Skin clean Dry and intact no rash, multiple scars present 5. Heart: Regular rate and rhythm no  Murmur, no Rub or gallop 6. Lungs:  Clear to auscultation bilaterally, no wheezes or crackles   7. Abdomen: Soft,  non-tender, Non distended bowel sounds present 8. Lower extremities: no clubbing, cyanosis, or edema 9. Neurologically Grossly intact, moving all 4 extremities equally   10. MSK: Normal range of motion   LABS:     Recent Labs  Lab 02/10/18 2036  WBC 15.3*  NEUTROABS 12.7*  HGB 10.8*  HCT 33.9*  MCV 91.9  PLT 221   Basic Metabolic Panel: Recent Labs  Lab  02/10/18 2036  NA 135  K 5.5*  CL 102  CO2 17*  GLUCOSE 142*  BUN 113*  CREATININE 6.37*  CALCIUM 6.9*      Recent Labs  Lab 02/10/18 2036  AST 255*  ALT 118*  ALKPHOS 53  BILITOT 0.6  PROT 6.9  ALBUMIN 3.3*     Cultures:    Component Value Date/Time   SDES BLOOD RIGHT WRIST 05/12/2017 1758   SPECREQUEST IN PEDIATRIC BOTTLE Blood Culture adequate volume 05/12/2017 1758   CULT NO GROWTH 5 DAYS 05/12/2017 1758   REPTSTATUS 05/17/2017 FINAL 05/12/2017 1758     Radiological Exams on Admission: Dg Chest 2 View  Result Date: 02/10/2018 CLINICAL DATA:  Syncope and fall from overdose. EXAM: CHEST - 2 VIEW COMPARISON:  12/31/2017 and  prior radiographs FINDINGS: The cardiomediastinal silhouette is unremarkable. Mild bibasilar atelectasis noted. There is no evidence of focal airspace disease, pulmonary edema, suspicious pulmonary nodule/mass, pleural effusion, or pneumothorax. No acute bony abnormalities are identified. IMPRESSION: Mild bibasilar atelectasis. Electronically Signed   By: Harmon Pier M.D.   On: 02/10/2018 21:22   Dg Ankle Complete Left  Result Date: 02/10/2018 CLINICAL DATA:  Question fall from standing. Left lateral ankle pain and swelling. EXAM: LEFT ANKLE COMPLETE - 3+ VIEW COMPARISON:  None. FINDINGS: Oblique mildly displaced distal fibular fracture at the level of the ankle mortise. No mortise widening. Lateral soft tissue edema. No additional fracture of the ankle. Overall alignment is maintained. IMPRESSION: Oblique mildly displaced distal fibular fracture at the level of the ankle mortise. Electronically Signed   By: Rubye Oaks M.D.   On: 02/10/2018 21:23    Chart has been reviewed    Assessment/Plan  52 y.o. female with medical history significant of narcotic abuse, HTN heroin abuse, history of cocaine use, fibromyalgia, GERD, hepatitis C, history of seizure disorder  Admitted for ARF in the setting of severe dehydration, rhabdomyolysis opioid  abuse  Present on Admission: . ARF (acute renal failure) (HCC) - - likely secondary to dehydration, and rhabdo, check FeNA and if  would obtain renal US and if not improved with IVF  Call back renal consult. Given past hx of urinary retention will order bladder scan and place foley as needed  . Hepatitis C, chronic (HCC) - obtain HEp C viral load . Opioid dependence (HCC). Polysubstance abuse (HCC) - social work consult  . Tobacco abuse -  - Spoke about importance of quitting,   - order nicotine patch   - nursing tobacco cessation protocol Elevated troponin - mild in the setting of hypotension and ARF continue to cycle and  Check echogram  . Rhabdomyolysis discussed with nephrology will rehydrate aggressively no indication for HD or bicarb drip at this point. monitor serial CK, and renal function if does not improve will need to re consult nephrology   Left  Ankle fracture - avoid iv narcotics able to tolerate PO, will need to follow up with orthopedics as outpatient.  Other plan as per orders.  Elevated LFTs in the setting of dehydration and hx of Hep C  Leukocytosis - will order UA no evidence of infectious process otherwise no fever. Continue to monitor. Possibly hemoconcentration.  Anemia - will order anemia panel, hemoccult stool  DVT prophylaxis:  Hep [place]    Code Status:  FULL CODE as per patient   I had personally discussed CODE STATUS with patient    Family Communication:   Family  at  Bedside    Disposition Plan:    To home once workup is complete and patient is stable   Social Work    consulted                          Consults called:  none   Admission status:   inpatient      Level of care    tele           Therisa Doyne 02/10/2018, 11:39 PM    [facility]  Pager [phone]   after 2 AM please page floor coverage PA If 7AM-7PM, please contact the day team taking care of the patient  [email or web address]  Password [email or web address]

## 2018-02-10 NOTE — Progress Notes (Signed)
Orthopedic Tech Progress Note Patient Details:  Ariana Jensen September 05, 1966 [id number]  Ortho Devices Type of Ortho Device: CAM walker Ortho Device/Splint Location: LLE Ortho Device/Splint Interventions: Ordered, Application   Post Interventions Patient Tolerated: Well Instructions Provided: Care of device   Jennye Moccasin 02/10/2018, 9:56 PM

## 2018-02-10 NOTE — ED Triage Notes (Signed)
Pt arrived EMS for reports of OD vs syncopal episode. Reports of heroine use yesterday or 2 days ago. Pt has been staying in a car for 1-2 days. Had a fall last night/early this morning and reports pain L ankle with swelling present. Pt also c/o neck pain unsure when it started. 20RFA NS given PTA. Initial BP 71/44 last 93/56 BP ST 110 CBG200

## 2018-02-10 NOTE — ED Notes (Signed)
Bladder scan resulted in order received for foley catheter placment

## 2018-02-10 NOTE — ED Notes (Signed)
[place] transport arrived at time of foley placement. [place] called prior to transport to floor but there was no answer. Pt transported to IP bed at this time.

## 2018-02-11 ENCOUNTER — Inpatient Hospital Stay (HOSPITAL_COMMUNITY): Payer: Self-pay

## 2018-02-11 ENCOUNTER — Other Ambulatory Visit: Payer: Self-pay

## 2018-02-11 DIAGNOSIS — R338 Other retention of urine: Secondary | ICD-10-CM | POA: Diagnosis present

## 2018-02-11 DIAGNOSIS — R55 Syncope and collapse: Secondary | ICD-10-CM

## 2018-02-11 LAB — HEMOGLOBIN A1C
Hgb A1c MFr Bld: 5.7 % — ABNORMAL HIGH (ref 4.8–5.6)
Mean Plasma Glucose: 116.89 mg/dL

## 2018-02-11 LAB — APTT: aPTT: 28 seconds (ref 24–36)

## 2018-02-11 LAB — URINALYSIS, ROUTINE W REFLEX MICROSCOPIC
BACTERIA UA: NONE SEEN
Bilirubin Urine: NEGATIVE
GLUCOSE, UA: NEGATIVE mg/dL
KETONES UR: NEGATIVE mg/dL
Leukocytes, UA: NEGATIVE
NITRITE: NEGATIVE
PH: 5 (ref 5.0–8.0)
PROTEIN: NEGATIVE mg/dL
Specific Gravity, Urine: 1.013 (ref 1.005–1.030)

## 2018-02-11 LAB — COMPREHENSIVE METABOLIC PANEL
ALK PHOS: 48 U/L (ref 38–126)
ALT: 100 U/L — AB (ref 14–54)
AST: 205 U/L — ABNORMAL HIGH (ref 15–41)
Albumin: 2.9 g/dL — ABNORMAL LOW (ref 3.5–5.0)
Anion gap: 11 (ref 5–15)
BUN: 77 mg/dL — ABNORMAL HIGH (ref 6–20)
CALCIUM: 7 mg/dL — AB (ref 8.9–10.3)
CO2: 18 mmol/L — ABNORMAL LOW (ref 22–32)
CREATININE: 2.07 mg/dL — AB (ref 0.44–1.00)
Chloride: 111 mmol/L (ref 101–111)
GFR, EST AFRICAN AMERICAN: 31 mL/min — AB (ref >60)
GFR, EST NON AFRICAN AMERICAN: 27 mL/min — AB (ref >60)
Glucose, Bld: 131 mg/dL — ABNORMAL HIGH (ref 65–99)
Potassium: 3.8 mmol/L (ref 3.5–5.1)
SODIUM: 140 mmol/L (ref 135–145)
Total Bilirubin: 0.6 mg/dL (ref 0.3–1.2)
Total Protein: 6.3 g/dL — ABNORMAL LOW (ref 6.5–8.1)

## 2018-02-11 LAB — TSH: TSH: 0.214 u[IU]/mL — AB (ref 0.350–4.500)

## 2018-02-11 LAB — IRON AND TIBC
Iron: 26 ug/dL — ABNORMAL LOW (ref 28–170)
SATURATION RATIOS: 9 % — AB (ref 10.4–31.8)
TIBC: 300 ug/dL (ref 250–450)
UIBC: 274 ug/dL

## 2018-02-11 LAB — RAPID URINE DRUG SCREEN, HOSP PERFORMED
AMPHETAMINES: NOT DETECTED
BENZODIAZEPINES: NOT DETECTED
Barbiturates: NOT DETECTED
Cocaine: POSITIVE — AB
Opiates: POSITIVE — AB
Tetrahydrocannabinol: NOT DETECTED

## 2018-02-11 LAB — RETICULOCYTES
RBC.: 3.73 MIL/uL — ABNORMAL LOW (ref 3.87–5.11)
RETIC CT PCT: 1.2 % (ref 0.4–3.1)
Retic Count, Absolute: 44.8 10*3/uL (ref 19.0–186.0)

## 2018-02-11 LAB — CBC
HCT: 34.2 % — ABNORMAL LOW (ref 36.0–46.0)
Hemoglobin: 10.8 g/dL — ABNORMAL LOW (ref 12.0–15.0)
MCH: 29 pg (ref 26.0–34.0)
MCHC: 31.6 g/dL (ref 30.0–36.0)
MCV: 91.9 fL (ref 78.0–100.0)
PLATELETS: 253 10*3/uL (ref 150–400)
RBC: 3.72 MIL/uL — AB (ref 3.87–5.11)
RDW: 16 % — ABNORMAL HIGH (ref 11.5–15.5)
WBC: 11.8 10*3/uL — AB (ref 4.0–10.5)

## 2018-02-11 LAB — FERRITIN: FERRITIN: 160 ng/mL (ref 11–307)

## 2018-02-11 LAB — FOLATE: FOLATE: 40.8 ng/mL (ref >5.9)

## 2018-02-11 LAB — PROTIME-INR
INR: 0.98
Prothrombin Time: 12.8 seconds (ref 11.4–15.2)

## 2018-02-11 LAB — HIV ANTIBODY (ROUTINE TESTING W REFLEX): HIV Screen 4th Generation wRfx: NONREACTIVE

## 2018-02-11 LAB — CREATININE, URINE, RANDOM: Creatinine, Urine: 129.97 mg/dL

## 2018-02-11 LAB — VITAMIN B12: Vitamin B-12: 226 pg/mL (ref 180–914)

## 2018-02-11 LAB — ECHOCARDIOGRAM COMPLETE
Height: 63 in
WEIGHTICAEL: 2144 [oz_av]

## 2018-02-11 LAB — PHOSPHORUS: PHOSPHORUS: 2.5 mg/dL (ref 2.5–4.6)

## 2018-02-11 LAB — TROPONIN I
Troponin I: <0.03 ng/mL (ref <0.03)
Troponin I: <0.03 ng/mL (ref <0.03)

## 2018-02-11 LAB — SODIUM, URINE, RANDOM: SODIUM UR: 52 mmol/L

## 2018-02-11 LAB — MAGNESIUM: MAGNESIUM: 2.5 mg/dL — AB (ref 1.7–2.4)

## 2018-02-11 MED ORDER — LAMOTRIGINE 25 MG PO TABS
25.0000 mg | ORAL_TABLET | Freq: Two times a day (BID) | ORAL | Status: DC
  Administered 2018-02-11 – 2018-02-13 (×6): 25 mg via ORAL
  Filled 2018-02-11 (×6): qty 1

## 2018-02-11 MED ORDER — PNEUMOCOCCAL VAC POLYVALENT 25 MCG/0.5ML IJ INJ
0.5000 mL | INJECTION | INTRAMUSCULAR | Status: DC
  Filled 2018-02-11: qty 0.5

## 2018-02-11 MED ORDER — ONDANSETRON HCL 4 MG PO TABS: 4.0000 mg | ORAL_TABLET | Freq: Four times a day (QID) | ORAL | Status: DC | PRN

## 2018-02-11 MED ORDER — FOLIC ACID 1 MG PO TABS
1.0000 mg | ORAL_TABLET | Freq: Every day | ORAL | Status: DC
  Administered 2018-02-11 – 2018-02-13 (×2): 1 mg via ORAL
  Filled 2018-02-11 (×3): qty 1

## 2018-02-11 MED ORDER — VITAMIN B-1 100 MG PO TABS
100.0000 mg | ORAL_TABLET | Freq: Every day | ORAL | Status: DC
  Administered 2018-02-11 – 2018-02-13 (×2): 100 mg via ORAL
  Filled 2018-02-11 (×2): qty 1

## 2018-02-11 MED ORDER — BUSPIRONE HCL 5 MG PO TABS
15.0000 mg | ORAL_TABLET | Freq: Two times a day (BID) | ORAL | Status: DC
  Administered 2018-02-11 – 2018-02-13 (×6): 15 mg via ORAL
  Filled 2018-02-11 (×6): qty 1

## 2018-02-11 MED ORDER — ONDANSETRON HCL 4 MG/2ML IJ SOLN: 4.0000 mg | Freq: Four times a day (QID) | INTRAMUSCULAR | Status: DC | PRN

## 2018-02-11 MED ORDER — ACETAMINOPHEN 325 MG PO TABS
650.0000 mg | ORAL_TABLET | Freq: Four times a day (QID) | ORAL | Status: DC | PRN
  Administered 2018-02-11 – 2018-02-13 (×6): 650 mg via ORAL
  Filled 2018-02-11 (×6): qty 2

## 2018-02-11 MED ORDER — SODIUM BICARBONATE 650 MG PO TABS
650.0000 mg | ORAL_TABLET | Freq: Two times a day (BID) | ORAL | Status: DC
Start: 2018-02-11 — End: 2018-02-13
  Administered 2018-02-11 – 2018-02-13 (×5): 650 mg via ORAL
  Filled 2018-02-11 (×5): qty 1

## 2018-02-11 MED ORDER — METHOCARBAMOL 500 MG PO TABS
750.0000 mg | ORAL_TABLET | Freq: Four times a day (QID) | ORAL | Status: DC
  Administered 2018-02-11 – 2018-02-13 (×7): 750 mg via ORAL
  Filled 2018-02-11 (×7): qty 2

## 2018-02-11 MED ORDER — METHOCARBAMOL 500 MG PO TABS
500.0000 mg | ORAL_TABLET | Freq: Two times a day (BID) | ORAL | Status: DC
  Administered 2018-02-11: 500 mg via ORAL
  Filled 2018-02-11: qty 1

## 2018-02-11 MED ORDER — GABAPENTIN 300 MG PO CAPS
300.0000 mg | ORAL_CAPSULE | Freq: Two times a day (BID) | ORAL | Status: DC
  Administered 2018-02-11 – 2018-02-13 (×6): 300 mg via ORAL
  Filled 2018-02-11 (×6): qty 1

## 2018-02-11 MED ORDER — NICOTINE 21 MG/24HR TD PT24
21.0000 mg | MEDICATED_PATCH | Freq: Every day | TRANSDERMAL | Status: DC
  Administered 2018-02-11: 21 mg via TRANSDERMAL
  Filled 2018-02-11 (×2): qty 1

## 2018-02-11 MED ORDER — ADULT MULTIVITAMIN W/MINERALS CH
1.0000 | ORAL_TABLET | Freq: Every day | ORAL | Status: DC
  Administered 2018-02-11 – 2018-02-13 (×2): 1 via ORAL
  Filled 2018-02-11 (×2): qty 1

## 2018-02-11 MED ORDER — TRAMADOL HCL 50 MG PO TABS
50.0000 mg | ORAL_TABLET | Freq: Four times a day (QID) | ORAL | Status: DC | PRN
  Administered 2018-02-11: 50 mg via ORAL
  Filled 2018-02-11: qty 1

## 2018-02-11 MED ORDER — SODIUM CHLORIDE 0.9 % IV SOLN
INTRAVENOUS | Status: DC
  Administered 2018-02-11: 01:00:00 via INTRAVENOUS

## 2018-02-11 MED ORDER — HEPARIN SODIUM (PORCINE) 5000 UNIT/ML IJ SOLN
5000.0000 [IU] | Freq: Three times a day (TID) | INTRAMUSCULAR | Status: DC
  Administered 2018-02-11 – 2018-02-13 (×6): 5000 [IU] via SUBCUTANEOUS
  Filled 2018-02-11 (×6): qty 1

## 2018-02-11 NOTE — Progress Notes (Signed)
Initial Nutrition Assessment  DOCUMENTATION CODES:   Not applicable  INTERVENTION:   Encourage po intake at meals RD to supplement as appropriate.    NUTRITION DIAGNOSIS:   Increased nutrient needs related to (fractures) as evidenced by estimated needs.  GOAL:   Patient will meet greater than or equal to 90% of their needs  MONITOR:   PO intake  REASON FOR ASSESSMENT:   Malnutrition Screening Tool    ASSESSMENT:   Pt with PMH of narcotic abuse, HTN, heroin abuse, cocaine use, fibromyalgia, GERD, hepatitis C, and seizure disorder admitted with opiate overdose s/p fall with L ankle fx.    Pt positive MST.  Attempted to see pt who was meeting with staff at time of visit.   Meal Completion: 75%  Medications reviewed and include: folvite, MVI, thiamine   NUTRITION - FOCUSED PHYSICAL EXAM:   Unable to complete Nutrition-Focused physical exam at this time.    Diet Order:   Diet Order           Diet Heart Room service appropriate? Yes; Fluid consistency: Thin  Diet effective ____          EDUCATION NEEDS:   No education needs have been identified at this time  Skin:  Skin Assessment: Reviewed RN Assessment  Last BM:  5/22  Height:   Ht Readings from Last 1 Encounters:  02/11/18  (1.6 m)    Weight:   Wt Readings from Last 1 Encounters:  02/11/18 134 lb (60.8 kg)    Ideal Body Weight:  52.2 kg  BMI:  Body mass index is 23.74 kg/m.  Estimated Nutritional Needs:   Kcal:  1600-1800  Protein:  75-100 grams  Fluid:  > 1.6 L/day  Kendell Bane RD, LDN, CNSC [phone] Pager [phone] After Hours Pager

## 2018-02-11 NOTE — Progress Notes (Signed)
  Echocardiogram 2D Echocardiogram has been performed.  Ariana Jensen M 02/11/2018, 10:47 AM
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
## 2018-02-12 ENCOUNTER — Inpatient Hospital Stay (HOSPITAL_COMMUNITY): Payer: Self-pay

## 2018-02-12 LAB — COMPREHENSIVE METABOLIC PANEL
ALBUMIN: 2.9 g/dL — AB (ref 3.5–5.0)
ALT: 89 U/L — ABNORMAL HIGH (ref 14–54)
ANION GAP: 8 (ref 5–15)
AST: 149 U/L — ABNORMAL HIGH (ref 15–41)
Alkaline Phosphatase: 53 U/L (ref 38–126)
BUN: 25 mg/dL — ABNORMAL HIGH (ref 6–20)
CHLORIDE: 111 mmol/L (ref 101–111)
CO2: 23 mmol/L (ref 22–32)
Calcium: 8.1 mg/dL — ABNORMAL LOW (ref 8.9–10.3)
Creatinine, Ser: 0.57 mg/dL (ref 0.44–1.00)
GFR calc Af Amer: >60 mL/min (ref >60)
GFR calc non Af Amer: >60 mL/min (ref >60)
GLUCOSE: 91 mg/dL (ref 65–99)
Potassium: 3.9 mmol/L (ref 3.5–5.1)
SODIUM: 142 mmol/L (ref 135–145)
Total Bilirubin: 0.4 mg/dL (ref 0.3–1.2)
Total Protein: 6.4 g/dL — ABNORMAL LOW (ref 6.5–8.1)

## 2018-02-12 LAB — CBC
HEMATOCRIT: 34.5 % — AB (ref 36.0–46.0)
HEMOGLOBIN: 11.2 g/dL — AB (ref 12.0–15.0)
MCH: 29.4 pg (ref 26.0–34.0)
MCHC: 32.5 g/dL (ref 30.0–36.0)
MCV: 90.6 fL (ref 78.0–100.0)
PLATELETS: 219 10*3/uL (ref 150–400)
RBC: 3.81 MIL/uL — AB (ref 3.87–5.11)
RDW: 15.7 % — ABNORMAL HIGH (ref 11.5–15.5)
WBC: 9.8 10*3/uL (ref 4.0–10.5)

## 2018-02-12 LAB — CK: CK TOTAL: 7543 U/L — AB (ref 38–234)

## 2018-02-12 LAB — HCV RNA QUANT
HCV Quantitative Log: 5.367 log10 IU/mL (ref >1.70)
HCV Quantitative: 233000 IU/mL (ref >50)

## 2018-02-12 LAB — T4, FREE: Free T4: 0.89 ng/dL (ref 0.82–1.77)

## 2018-02-12 LAB — HEPATITIS PANEL, ACUTE
HCV Ab: >11 s/co ratio — ABNORMAL HIGH (ref 0.0–0.9)
HEP A IGM: NEGATIVE
Hep B C IgM: NEGATIVE
Hepatitis B Surface Ag: NEGATIVE

## 2018-02-12 MED ORDER — HYDRALAZINE HCL 20 MG/ML IJ SOLN: 10.0000 mg | Freq: Four times a day (QID) | INTRAMUSCULAR | Status: DC | PRN

## 2018-02-12 MED ORDER — SIMETHICONE 80 MG PO CHEW
80.0000 mg | CHEWABLE_TABLET | Freq: Once | ORAL | Status: AC
  Administered 2018-02-12: 80 mg via ORAL
  Filled 2018-02-12: qty 1

## 2018-02-12 MED ORDER — LORAZEPAM 0.5 MG PO TABS
0.5000 mg | ORAL_TABLET | Freq: Four times a day (QID) | ORAL | Status: DC
  Administered 2018-02-12 – 2018-02-13 (×3): 0.5 mg via ORAL
  Filled 2018-02-12 (×3): qty 1

## 2018-02-12 MED ORDER — CALCIUM CARBONATE ANTACID 500 MG PO CHEW
200.0000 mg | CHEWABLE_TABLET | Freq: Three times a day (TID) | ORAL | Status: DC
  Administered 2018-02-12 – 2018-02-13 (×4): 200 mg via ORAL
  Filled 2018-02-12 (×4): qty 1

## 2018-02-12 MED ORDER — GADOBENATE DIMEGLUMINE 529 MG/ML IV SOLN
12.0000 mL | Freq: Once | INTRAVENOUS | Status: AC
  Administered 2018-02-12: 12 mL via INTRAVENOUS

## 2018-02-12 MED ORDER — HYDRALAZINE HCL 20 MG/ML IJ SOLN
10.0000 mg | Freq: Once | INTRAMUSCULAR | Status: AC
  Administered 2018-02-12: 10 mg via INTRAVENOUS
  Filled 2018-02-12: qty 1

## 2018-02-12 NOTE — Progress Notes (Signed)
3E 13 c/o stomach gas, bloating ang sour heartburn feeling,  states "very uncomfortable"  no PRNs to help
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
## 2018-02-13 LAB — T3, FREE: T3 FREE: 1.5 pg/mL — AB (ref 2.0–4.4)

## 2018-02-13 LAB — CK
Total CK: 2514 U/L — ABNORMAL HIGH (ref 38–234)
Total CK: 17056 U/L — ABNORMAL HIGH (ref 38–234)

## 2018-02-13 MED ORDER — SODIUM CHLORIDE 0.9 % IV BOLUS
1000.0000 mL | Freq: Once | INTRAVENOUS | Status: AC
  Administered 2018-02-13: 1000 mL via INTRAVENOUS

## 2018-02-13 NOTE — Progress Notes (Signed)
Patient CK resulted, 17,056. Dr. Mariea Clonts made aware.

## 2018-02-13 NOTE — Care Management Note (Signed)
Case Management Note  Patient Details  Name: Kimber Fritts MRN: [id number] Date of Birth: 11/28/1965  Subjective/Objective:   52 y.o. female admitted 02/11/18  for possible opioid overdose. Syncopal event upon EMS arrival, found to have L distal fib fx from fall and in acute renal failure.  PTA, pt homeless and independent.                   Action/Plan: Pt medically stable for dc home today.  Recommended DME ordered and requested from [facility] through their charity progam, as pt is uninsured.  Pt has been calling shelters this AM to find a place to go.  Pt is uninsured, but is eligible for medication assistance through [facility] program. [facility] letter given with explanation of program benefits.  Pt is appreciative of help given.    Expected Discharge Date:                  Expected Discharge Plan:  Homeless Shelter  In-House Referral:  Clinical Social Work  Discharge planning Services  CM Consult  Post Acute Care Choice:    Choice offered to:     DME Arranged:  3-N-1, Walker rolling, Wheelchair manual DME Agency:  [facility]  HH Arranged:    HH Agency:     Status of Service:  Completed, signed off  If discussed at [employer], dates discussed:    Additional Comments:  Quintella Baton, RN, BSN  Trauma/Neuro ICU Case Manager [phone]

## 2018-02-13 NOTE — Progress Notes (Signed)
Patient called RN into room and stated she wanted to leave.  She said the walls were closing in and she needed to get out of the room. RN told patient if she leaves, then it would be against medical advise.  Patient stated she still wants to leave.  Dr. Valerie Salts paged and made aware.  He spoke with patient as well.  Patient stated she still wanted to leave and signed AMA form.    Raynelle Fanning with case manager paged and made aware as well.  She stated it was ok for patient to take equipment delivered.  Patient took wheelchair and walker, but was unable to take commode.
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
## 2018-03-03 ENCOUNTER — Other Ambulatory Visit: Payer: Self-pay

## 2018-03-03 ENCOUNTER — Inpatient Hospital Stay (HOSPITAL_COMMUNITY)
Admission: EM | Admit: 2018-03-03 | Discharge: 2018-03-05 | DRG: 918 | Discharge status: Against Medical Advice | Payer: Self-pay | Attending: Family Medicine | Admitting: Family Medicine

## 2018-03-03 DIAGNOSIS — R000 Tachycardia, unspecified: Secondary | ICD-10-CM | POA: Diagnosis present

## 2018-03-03 DIAGNOSIS — Z8249 Family history of ischemic heart disease and other diseases of the circulatory system: Secondary | ICD-10-CM

## 2018-03-03 DIAGNOSIS — G40909 Epilepsy, unspecified, not intractable, without status epilepticus: Secondary | ICD-10-CM | POA: Diagnosis present

## 2018-03-03 DIAGNOSIS — Z8611 Personal history of tuberculosis: Secondary | ICD-10-CM

## 2018-03-03 DIAGNOSIS — F1410 Cocaine abuse, uncomplicated: Secondary | ICD-10-CM | POA: Diagnosis present

## 2018-03-03 DIAGNOSIS — E876 Hypokalemia: Secondary | ICD-10-CM | POA: Diagnosis present

## 2018-03-03 DIAGNOSIS — F19929 Other psychoactive substance use, unspecified with intoxication, unspecified: Secondary | ICD-10-CM

## 2018-03-03 DIAGNOSIS — I10 Essential (primary) hypertension: Secondary | ICD-10-CM | POA: Diagnosis present

## 2018-03-03 DIAGNOSIS — I493 Ventricular premature depolarization: Secondary | ICD-10-CM | POA: Diagnosis present

## 2018-03-03 DIAGNOSIS — T50901A Poisoning by unspecified drugs, medicaments and biological substances, accidental (unintentional), initial encounter: Principal | ICD-10-CM | POA: Diagnosis present

## 2018-03-03 DIAGNOSIS — F329 Major depressive disorder, single episode, unspecified: Secondary | ICD-10-CM | POA: Diagnosis present

## 2018-03-03 DIAGNOSIS — Z9079 Acquired absence of other genital organ(s): Secondary | ICD-10-CM

## 2018-03-03 DIAGNOSIS — M797 Fibromyalgia: Secondary | ICD-10-CM | POA: Diagnosis present

## 2018-03-03 DIAGNOSIS — B1920 Unspecified viral hepatitis C without hepatic coma: Secondary | ICD-10-CM | POA: Diagnosis present

## 2018-03-03 DIAGNOSIS — R451 Restlessness and agitation: Secondary | ICD-10-CM | POA: Diagnosis present

## 2018-03-03 DIAGNOSIS — F1120 Opioid dependence, uncomplicated: Secondary | ICD-10-CM | POA: Diagnosis present

## 2018-03-03 DIAGNOSIS — Z79899 Other long term (current) drug therapy: Secondary | ICD-10-CM

## 2018-03-03 DIAGNOSIS — Z915 Personal history of self-harm: Secondary | ICD-10-CM

## 2018-03-03 DIAGNOSIS — F419 Anxiety disorder, unspecified: Secondary | ICD-10-CM | POA: Diagnosis present

## 2018-03-03 DIAGNOSIS — G8929 Other chronic pain: Secondary | ICD-10-CM | POA: Diagnosis present

## 2018-03-03 DIAGNOSIS — F17210 Nicotine dependence, cigarettes, uncomplicated: Secondary | ICD-10-CM | POA: Diagnosis present

## 2018-03-03 DIAGNOSIS — F19129 Other psychoactive substance abuse with intoxication, unspecified: Secondary | ICD-10-CM | POA: Diagnosis present

## 2018-03-03 DIAGNOSIS — Z781 Physical restraint status: Secondary | ICD-10-CM

## 2018-03-03 DIAGNOSIS — Z22322 Carrier or suspected carrier of Methicillin resistant Staphylococcus aureus: Secondary | ICD-10-CM

## 2018-03-03 DIAGNOSIS — K219 Gastro-esophageal reflux disease without esophagitis: Secondary | ICD-10-CM | POA: Diagnosis present

## 2018-03-03 DIAGNOSIS — D72829 Elevated white blood cell count, unspecified: Secondary | ICD-10-CM | POA: Diagnosis present

## 2018-03-03 HISTORY — DX: Hypokalemia: E87.6

## 2018-03-03 HISTORY — DX: Ventricular premature depolarization: I49.3

## 2018-03-03 HISTORY — DX: Elevated white blood cell count, unspecified: D72.829

## 2018-03-03 LAB — ACETAMINOPHEN LEVEL: Acetaminophen (Tylenol), Serum: <10 ug/mL — ABNORMAL LOW (ref 10–30)

## 2018-03-03 LAB — COMPREHENSIVE METABOLIC PANEL
ALK PHOS: 88 U/L (ref 38–126)
ALT: 19 U/L (ref 14–54)
AST: 26 U/L (ref 15–41)
Albumin: 3.8 g/dL (ref 3.5–5.0)
Anion gap: 11 (ref 5–15)
BILIRUBIN TOTAL: 0.6 mg/dL (ref 0.3–1.2)
BUN: 16 mg/dL (ref 6–20)
CALCIUM: 8.9 mg/dL (ref 8.9–10.3)
CHLORIDE: 106 mmol/L (ref 101–111)
CO2: 23 mmol/L (ref 22–32)
CREATININE: 0.6 mg/dL (ref 0.44–1.00)
GFR calc Af Amer: >60 mL/min (ref >60)
Glucose, Bld: 132 mg/dL — ABNORMAL HIGH (ref 65–99)
Potassium: 2.7 mmol/L — CL (ref 3.5–5.1)
Sodium: 140 mmol/L (ref 135–145)
TOTAL PROTEIN: 7.2 g/dL (ref 6.5–8.1)

## 2018-03-03 LAB — CBC
HCT: 36.2 % (ref 36.0–46.0)
Hemoglobin: 11.8 g/dL — ABNORMAL LOW (ref 12.0–15.0)
MCH: 29.6 pg (ref 26.0–34.0)
MCHC: 32.6 g/dL (ref 30.0–36.0)
MCV: 91 fL (ref 78.0–100.0)
PLATELETS: 397 10*3/uL (ref 150–400)
RBC: 3.98 MIL/uL (ref 3.87–5.11)
RDW: 16.2 % — ABNORMAL HIGH (ref 11.5–15.5)
WBC: 19.2 10*3/uL — ABNORMAL HIGH (ref 4.0–10.5)

## 2018-03-03 LAB — ETHANOL: Alcohol, Ethyl (B): <10 mg/dL (ref <10)

## 2018-03-03 LAB — SALICYLATE LEVEL: Salicylate Lvl: <7 mg/dL (ref 2.8–30.0)

## 2018-03-03 MED ORDER — LORAZEPAM 2 MG/ML IJ SOLN
2.0000 mg | Freq: Once | INTRAMUSCULAR | Status: DC
  Filled 2018-03-03: qty 1

## 2018-03-03 MED ORDER — DIPHENHYDRAMINE HCL 50 MG/ML IJ SOLN
50.0000 mg | Freq: Once | INTRAMUSCULAR | Status: DC
  Filled 2018-03-03: qty 1

## 2018-03-03 MED ORDER — LORAZEPAM 2 MG/ML IJ SOLN
2.0000 mg | Freq: Once | INTRAMUSCULAR | Status: AC
  Administered 2018-03-03: 2 mg via INTRAMUSCULAR

## 2018-03-03 MED ORDER — DIPHENHYDRAMINE HCL 50 MG/ML IJ SOLN
50.0000 mg | Freq: Once | INTRAMUSCULAR | Status: AC
  Administered 2018-03-03: 50 mg via INTRAMUSCULAR

## 2018-03-03 MED ORDER — POTASSIUM CHLORIDE CRYS ER 20 MEQ PO TBCR
40.0000 meq | EXTENDED_RELEASE_TABLET | Freq: Once | ORAL | Status: AC
  Administered 2018-03-03: 40 meq via ORAL
  Filled 2018-03-03: qty 2

## 2018-03-03 NOTE — ED Notes (Signed)
Bed: WA18 Expected date:  Expected time:  Means of arrival:  Comments: EMS 

## 2018-03-03 NOTE — ED Notes (Signed)
ED Provider at bedside. 

## 2018-03-03 NOTE — ED Notes (Signed)
This nurse offered female catheter and bed pan, pt says "cant use those"
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
## 2018-03-03 NOTE — ED Triage Notes (Signed)
Pt to ED via [facility] with c/o of Drug overdose, EMS states that she said she did heroin but got it from a different dealer than normal.  Ems was not able to get vitals for the patient is highly mobile and thrashing around. Pt is screaming and will not stay still, trying to kick and get out of the bed and was like this the whole ride here. Pt has had 5 Versed IM and 10 mg of Haldol by EMS on route

## 2018-03-04 ENCOUNTER — Inpatient Hospital Stay (HOSPITAL_COMMUNITY): Payer: Self-pay

## 2018-03-04 ENCOUNTER — Other Ambulatory Visit: Payer: Self-pay

## 2018-03-04 ENCOUNTER — Encounter (HOSPITAL_COMMUNITY): Payer: Self-pay | Admitting: Internal Medicine

## 2018-03-04 DIAGNOSIS — T50901A Poisoning by unspecified drugs, medicaments and biological substances, accidental (unintentional), initial encounter: Secondary | ICD-10-CM | POA: Diagnosis present

## 2018-03-04 DIAGNOSIS — F19929 Other psychoactive substance use, unspecified with intoxication, unspecified: Secondary | ICD-10-CM

## 2018-03-04 DIAGNOSIS — I493 Ventricular premature depolarization: Secondary | ICD-10-CM

## 2018-03-04 DIAGNOSIS — D72829 Elevated white blood cell count, unspecified: Secondary | ICD-10-CM

## 2018-03-04 DIAGNOSIS — E876 Hypokalemia: Secondary | ICD-10-CM

## 2018-03-04 DIAGNOSIS — T50904A Poisoning by unspecified drugs, medicaments and biological substances, undetermined, initial encounter: Secondary | ICD-10-CM

## 2018-03-04 HISTORY — DX: Ventricular premature depolarization: I49.3

## 2018-03-04 HISTORY — DX: Hypokalemia: E87.6

## 2018-03-04 HISTORY — DX: Elevated white blood cell count, unspecified: D72.829

## 2018-03-04 LAB — CBC
HCT: 37.7 % (ref 36.0–46.0)
Hemoglobin: 12.1 g/dL (ref 12.0–15.0)
MCH: 29.1 pg (ref 26.0–34.0)
MCHC: 32.1 g/dL (ref 30.0–36.0)
MCV: 90.6 fL (ref 78.0–100.0)
PLATELETS: 432 10*3/uL — AB (ref 150–400)
RBC: 4.16 MIL/uL (ref 3.87–5.11)
RDW: 16.4 % — AB (ref 11.5–15.5)
WBC: 9.5 10*3/uL (ref 4.0–10.5)

## 2018-03-04 LAB — URINALYSIS, ROUTINE W REFLEX MICROSCOPIC
Bilirubin Urine: NEGATIVE
GLUCOSE, UA: NEGATIVE mg/dL
Hgb urine dipstick: NEGATIVE
Ketones, ur: 5 mg/dL — AB
LEUKOCYTES UA: NEGATIVE
NITRITE: NEGATIVE
PH: 6 (ref 5.0–8.0)
Protein, ur: NEGATIVE mg/dL
SPECIFIC GRAVITY, URINE: 1.021 (ref 1.005–1.030)

## 2018-03-04 LAB — RAPID URINE DRUG SCREEN, HOSP PERFORMED
AMPHETAMINES: NOT DETECTED
BENZODIAZEPINES: POSITIVE — AB
Cocaine: POSITIVE — AB
Opiates: POSITIVE — AB
TETRAHYDROCANNABINOL: NOT DETECTED

## 2018-03-04 LAB — COMPREHENSIVE METABOLIC PANEL
ALT: 23 U/L (ref 14–54)
AST: 47 U/L — ABNORMAL HIGH (ref 15–41)
Albumin: 3.8 g/dL (ref 3.5–5.0)
Alkaline Phosphatase: 90 U/L (ref 38–126)
Anion gap: 9 (ref 5–15)
BUN: 16 mg/dL (ref 6–20)
CHLORIDE: 110 mmol/L (ref 101–111)
CO2: 23 mmol/L (ref 22–32)
Calcium: 9.4 mg/dL (ref 8.9–10.3)
Creatinine, Ser: 0.57 mg/dL (ref 0.44–1.00)
Glucose, Bld: 130 mg/dL — ABNORMAL HIGH (ref 65–99)
POTASSIUM: 3.9 mmol/L (ref 3.5–5.1)
SODIUM: 142 mmol/L (ref 135–145)
Total Bilirubin: 0.3 mg/dL (ref 0.3–1.2)
Total Protein: 7.4 g/dL (ref 6.5–8.1)

## 2018-03-04 LAB — CK: CK TOTAL: 1421 U/L — AB (ref 38–234)

## 2018-03-04 LAB — I-STAT BETA HCG BLOOD, ED (NOT ORDERABLE)

## 2018-03-04 LAB — VALPROIC ACID LEVEL: Valproic Acid Lvl: <10 ug/mL — ABNORMAL LOW (ref 50.0–100.0)

## 2018-03-04 LAB — T4, FREE: FREE T4: 1.09 ng/dL (ref 0.82–1.77)

## 2018-03-04 LAB — MRSA PCR SCREENING: MRSA by PCR: POSITIVE — AB

## 2018-03-04 LAB — MAGNESIUM: MAGNESIUM: 1.8 mg/dL (ref 1.7–2.4)

## 2018-03-04 LAB — TROPONIN I: Troponin I: <0.03 ng/mL (ref <0.03)

## 2018-03-04 LAB — TSH: TSH: 0.332 u[IU]/mL — AB (ref 0.350–4.500)

## 2018-03-04 MED ORDER — NICOTINE 21 MG/24HR TD PT24
21.0000 mg | MEDICATED_PATCH | Freq: Every day | TRANSDERMAL | Status: DC
  Administered 2018-03-04 – 2018-03-05 (×2): 21 mg via TRANSDERMAL
  Filled 2018-03-04 (×2): qty 1

## 2018-03-04 MED ORDER — SODIUM CHLORIDE 0.45 % IV SOLN
INTRAVENOUS | Status: DC
  Administered 2018-03-04 – 2018-03-05 (×5): via INTRAVENOUS

## 2018-03-04 MED ORDER — LORAZEPAM 2 MG/ML IJ SOLN
1.0000 mg | Freq: Four times a day (QID) | INTRAMUSCULAR | Status: DC | PRN
  Administered 2018-03-04: 1 mg via INTRAVENOUS
  Filled 2018-03-04: qty 1

## 2018-03-04 MED ORDER — POTASSIUM CHLORIDE CRYS ER 20 MEQ PO TBCR
40.0000 meq | EXTENDED_RELEASE_TABLET | Freq: Once | ORAL | Status: AC
  Administered 2018-03-04: 40 meq via ORAL
  Filled 2018-03-04: qty 2

## 2018-03-04 MED ORDER — MUPIROCIN 2 % EX OINT
TOPICAL_OINTMENT | Freq: Two times a day (BID) | CUTANEOUS | Status: DC
  Administered 2018-03-04 – 2018-03-05 (×3): via NASAL
  Filled 2018-03-04: qty 22

## 2018-03-04 MED ORDER — CLONIDINE HCL 0.2 MG/24HR TD PTWK
0.2000 mg | MEDICATED_PATCH | TRANSDERMAL | Status: DC
  Administered 2018-03-04: 0.2 mg via TRANSDERMAL
  Filled 2018-03-04: qty 1

## 2018-03-04 MED ORDER — HYDRALAZINE HCL 20 MG/ML IJ SOLN
10.0000 mg | INTRAMUSCULAR | Status: DC | PRN
  Administered 2018-03-04 – 2018-03-05 (×5): 10 mg via INTRAVENOUS
  Filled 2018-03-04 (×5): qty 1

## 2018-03-04 MED ORDER — ORAL CARE MOUTH RINSE
15.0000 mL | Freq: Two times a day (BID) | OROMUCOSAL | Status: DC
  Administered 2018-03-04: 15 mL via OROMUCOSAL

## 2018-03-04 MED ORDER — LORAZEPAM 2 MG/ML IJ SOLN
2.0000 mg | Freq: Four times a day (QID) | INTRAMUSCULAR | Status: DC | PRN
  Administered 2018-03-04 (×2): 2 mg via INTRAVENOUS
  Filled 2018-03-04 (×2): qty 1

## 2018-03-04 MED ORDER — ENOXAPARIN SODIUM 40 MG/0.4ML ~~LOC~~ SOLN
40.0000 mg | SUBCUTANEOUS | Status: DC
  Administered 2018-03-04: 40 mg via SUBCUTANEOUS
  Filled 2018-03-04: qty 0.4

## 2018-03-04 MED ORDER — CHLORHEXIDINE GLUCONATE 0.12 % MT SOLN
15.0000 mL | Freq: Two times a day (BID) | OROMUCOSAL | Status: DC
  Administered 2018-03-04 – 2018-03-05 (×3): 15 mL via OROMUCOSAL
  Filled 2018-03-04: qty 15

## 2018-03-04 NOTE — ED Notes (Signed)
Attempted to give report. Secretary ask that this Clinical research associatewriter call back in 15 min.

## 2018-03-04 NOTE — ED Notes (Signed)
Report given Sapna, RN.

## 2018-03-04 NOTE — ED Notes (Signed)
Attempted to give report. Charge nurse christian,RN will call back when bed is ready.

## 2018-03-04 NOTE — ED Provider Notes (Signed)
Nursing notes and vitals signs, including pulse oximetry, reviewed.  Summary of this visit's results, reviewed by myself:  EKG:  EKG Interpretation  Date/Time:  Thursday March 03 2018 20:38:20 EDT Ventricular Rate:  99 PR Interval:    QRS Duration: 94 QT Interval:  427 QTC Calculation: 548 R Axis:   48 Text Interpretation:  Sinus tachycardia Multiform ventricular premature complexes Consider right atrial enlargement Probable left ventricular hypertrophy Prolonged QT interval Confirmed by Alona BeneLong, Joshua [phone]([id number]) on 03/03/2018 8:52:11 PM       Labs:  Results for orders placed or performed during the hospital encounter of 03/03/18 (from the past 24 hour(s))  Comprehensive metabolic panel     Status: Abnormal   Collection Time: 03/03/18  9:16 PM  Result Value Ref Range   Sodium 140 135 - 145 mmol/L   Potassium 2.7 (LL) 3.5 - 5.1 mmol/L   Chloride 106 101 - 111 mmol/L   CO2 23 22 - 32 mmol/L   Glucose, Bld 132 (H) 65 - 99 mg/dL   BUN 16 6 - 20 mg/dL   Creatinine, Ser 6.040.60 0.44 - 1.00 mg/dL   Calcium 8.9 8.9 - 54.010.3 mg/dL   Total Protein 7.2 6.5 - 8.1 g/dL   Albumin 3.8 3.5 - 5.0 g/dL   AST 26 15 - 41 U/L   ALT 19 14 - 54 U/L   Alkaline Phosphatase 88 38 - 126 U/L   Total Bilirubin 0.6 0.3 - 1.2 mg/dL   GFR calc non Af Amer >60 >60 mL/min   GFR calc Af Amer >60 >60 mL/min   Anion gap 11 5 - 15  Ethanol     Status: None   Collection Time: 03/03/18  9:16 PM  Result Value Ref Range   Alcohol, Ethyl (B) <10 <10 mg/dL  Salicylate level     Status: None   Collection Time: 03/03/18  9:16 PM  Result Value Ref Range   Salicylate Lvl <7.0 2.8 - 30.0 mg/dL  Acetaminophen level     Status: Abnormal   Collection Time: 03/03/18  9:16 PM  Result Value Ref Range   Acetaminophen (Tylenol), Serum <10 (L) 10 - 30 ug/mL  cbc     Status: Abnormal   Collection Time: 03/03/18  9:16 PM  Result Value Ref Range   WBC 19.2 (H) 4.0 - 10.5 K/uL   RBC 3.98 3.87 - 5.11 MIL/uL   Hemoglobin 11.8 (L)  12.0 - 15.0 g/dL   HCT 98.136.2 19.136.0 - 47.846.0 %   MCV 91.0 78.0 - 100.0 fL   MCH 29.6 26.0 - 34.0 pg   MCHC 32.6 30.0 - 36.0 g/dL   RDW 29.516.2 (H) 62.111.5 - 30.815.5 %   Platelets 397 150 - 400 K/uL  Rapid urine drug screen (hospital performed)     Status: Abnormal   Collection Time: 03/03/18 11:47 PM  Result Value Ref Range   Opiates POSITIVE (A) NONE DETECTED   Cocaine POSITIVE (A) NONE DETECTED   Benzodiazepines POSITIVE (A) NONE DETECTED   Amphetamines NONE DETECTED NONE DETECTED   Tetrahydrocannabinol NONE DETECTED NONE DETECTED   Barbiturates (A) NONE DETECTED    Result not available. Reagent lot number recalled by manufacturer.   7:22 AM Patient still agitated and disoriented.  We will have her admitted for altered mental status.  This is likely due to mixed drug overdose plus additional medication given in ED for sedation.     Whit Bruni, Jonny RuizJohn, MD 03/04/18 [phone]

## 2018-03-04 NOTE — H&P (Addendum)
History and Physical    Shearon BaloSamantha Searing ZOX:[id number] DOB: [birth year] DOA: 03/03/2018  PCP: Lavinia SharpsPlacey, Mary Ann, NP Patient coming from:unknown  Chief Complaint: Drug overdose  HPI: Shearon BaloSamantha Godshall is a 52 y.o. female with medical history significant of hepatitis C, narcotic abuse, heroin abuse, cocaine abuse, fibromyalgia, history of seizure disorder, hypertension admitted with multidrug overdose.  When I saw her she was in restraints trying to pull off the telemetry leads.  But she was able to tell me that she overdosed and that she overdosed 2 weeks ago and went to a rehab.  She does not know where she is right now.  She could not answer anymore questions that was being asked.  When I asked her if she has family she says sometimes.  She was admitted to the hospital 523 and discharged 02/13/2018 AGAINST MEDICAL ADVICE.  She was admitted with drug overdose and fall and left ankle fracture found to have AKI with a creatinine of 6.37 during that admission and her creatinine came down to 0.5 with IV hydration.  Had elevated liver function test had MRCP done showed stone in the gallbladder neck she also had elevated CPK of 17,000 during the work-up patient left AGAINST MEDICAL ADVICE.  Per ER physician note soon as she came into the ER last night patient reported that she thought she was injecting heroin but started twitching in her legs and felt very bad .  ED Course: Patient was given Benadryl, Ativan, magnesium, potassium a total of 80 mEq.  Her vital signs were 170/92 which went up to 195/85 with a pulse of 76 up to 104 and respiration of 24 saturating 99% on room air.  Her labs showed a sodium of 140 potassium 2.7 BUN 16 creatinine 0.60 alkaline phosphatase 88 AST 26 ALT 19, WBC 19.2 hemoglobin 11.8 platelet count 397.  Urine drug screen was positive for benzodiazepines, opiates, cocaine.  Alcohol level was less than 10 salicylate level was less than 7 acetaminophen level was less than 10.  EKG  showed sinus tach, multiple PVCs, QT prolongation 548.  EMS gave her Versed and Haldol prior to arrival to the ER.  Review of Systems: Cannot obtain any review of systems as patient agitated from drug overdose Ambulatory Status unknown  Past Medical History:  Diagnosis Date  . Anxiety   . ASCUS (atypical squamous cells of undetermined significance) on Pap smear   . Chronic lower back pain   . Cocaine abuse (HCC)    crack cocaine  . Depression   . Fibromyalgia   . GERD (gastroesophageal reflux disease)   . Hepatitis C   . Hepatitis C antibody test positive   . Hypertension   . Opioid dependence (HCC)   . Seizures (HCC)    "don't know why; I've had 2 in the past year" (08/18/2016)  . Trichomonas 04/17/2011   Diagnosed 04/02/11 during hospitalization, treated with Flagyl 2g, patient instructed to have partner treated (must follow-up)   . Tuberculosis    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 age 89, now s/p 1-yr of multidrug treatment (dates unknown)    Past Surgical History:  Procedure Laterality Date  . CESAREAN SECTION  1984  . DILATION AND CURETTAGE OF UTERUS    . I&D EXTREMITY  10/18/2011   Procedure: IRRIGATION AND DEBRIDEMENT EXTREMITY;  Surgeon: Sharma CovertFred W Ortmann, MD;  Location: [facility] OR;  Service: Orthopedics;  Laterality: Left;  . I&D EXTREMITY  10/21/2011   Procedure: IRRIGATION AND DEBRIDEMENT EXTREMITY;  Surgeon: Sharma CovertFred W Ortmann,  MD;  Location: [facility] OR;  Service: Orthopedics;  Laterality: Left;  . SALPINGECTOMY Left March 2010   ectopic pregnancy    Social History   Socioeconomic History  . Marital status: Widowed    Spouse name: Not on file  . Number of children: 1  . Years of education: 7th grade  . Highest education level: Not on file  Occupational History  . Not on file  Social Needs  . Financial resource strain: Not on file  . Food insecurity:    Worry: Not on file    Inability: Not on file  . Transportation needs:    Medical: Not on file    Non-medical: Not  on file  Tobacco Use  . Smoking status: Current Every Day Smoker    Packs/day: 0.50    Years: 32.00    Pack years: 16.00    Types: Cigarettes  . Smokeless tobacco: Never Used  Substance and Sexual Activity  . Alcohol use: Yes    Alcohol/week: 0.0 oz    Comment: occ  . Drug use: Yes    Types: Oxycodone, Heroin, Cocaine, IV  . Sexual activity: Not Currently    Birth control/protection: None  Lifestyle  . Physical activity:    Days per week: Not on file    Minutes per session: Not on file  . Stress: Not on file  Relationships  . Social connections:    Talks on phone: Not on file    Gets together: Not on file    Attends religious service: Not on file    Active member of club or organization: Not on file    Attends meetings of clubs or organizations: Not on file    Relationship status: Not on file  . Intimate partner violence:    Fear of current or ex partner: Not on file    Emotionally abused: Not on file    Physically abused: Not on file    Forced sexual activity: Not on file  Other Topics Concern  . Not on file  Social History Narrative   Lives in [place]   Currently Unemployeed    No Known Allergies  Family History  Problem Relation Age of Onset  . Heart attack Mother   . Heart attack Father   . Heart attack Paternal Grandmother   . Heart attack Paternal Grandfather   . Cirrhosis Brother       Prior to Admission medications   Medication Sig Start Date End Date Taking? Authorizing Provider  acetaminophen (TYLENOL) 325 MG tablet Take 325-650 mg by mouth every 8 (eight) hours as needed (for pain or headaches).     [provider]  amLODipine (NORVASC) 10 MG tablet Take 1 tablet (10 mg total) by mouth daily. 07/27/16   Ardith Dark, MD  busPIRone (BUSPAR) 15 MG tablet Take 15 mg by mouth 2 (two) times daily.    [provider]  camphor-menthol Wynelle Fanny) lotion Apply 1 application topically as needed for itching. Patient not taking: Reported on  02/10/2018 07/27/16   Ardith Dark, MD  cetirizine (ZYRTEC) 10 MG tablet Take 1 tablet (10 mg total) by mouth daily. Patient not taking: Reported on 02/10/2018 07/27/16   Ardith Dark, MD  cloNIDine (CATAPRES) 0.2 MG tablet Take 0.2 mg by mouth daily.     [provider]  divalproex (DEPAKOTE) 250 MG DR tablet Take 250 mg by mouth 2 (two) times daily.    [provider]  ferrous sulfate 325 (65 FE)  MG tablet Take 325 mg by mouth daily with breakfast.    [provider]  gabapentin (NEURONTIN) 300 MG capsule Take 300 mg by mouth 2 (two) times daily.    [provider]  hydrochlorothiazide (HYDRODIURIL) 25 MG tablet Take 1 tablet (25 mg total) by mouth daily. 04/02/16   Ardith Dark, MD  hydrOXYzine (ATARAX/VISTARIL) 25 MG tablet Take 1 tablet (25 mg total) by mouth every 8 (eight) hours as needed for itching (for severe itching.). Patient not taking: Reported on 02/10/2018 07/27/16   Ardith Dark, MD  hydrOXYzine (VISTARIL) 50 MG capsule Take 50 mg by mouth 2 (two) times daily.    [provider]  lamoTRIgine (LAMICTAL) 25 MG tablet Take 25 mg by mouth 2 (two) times daily.    [provider]  lisinopril (PRINIVIL,ZESTRIL) 20 MG tablet Take 1 tablet (20 mg total) by mouth daily. Patient not taking: Reported on 02/10/2018 04/02/16   Ardith Dark, MD  lisinopril (PRINIVIL,ZESTRIL) 40 MG tablet Take 40 mg by mouth daily.    [provider]  methocarbamol (ROBAXIN) 500 MG tablet Take 1 tablet (500 mg total) by mouth 2 (two) times daily. Patient not taking: Reported on 02/10/2018 07/27/16   Jaynie Crumble, PA-C  naproxen (NAPROSYN) 500 MG tablet Take 1 tablet (500 mg total) by mouth 2 (two) times daily. Patient not taking: Reported on 02/10/2018 11/26/16   Alvina Chou, [place]  neomycin-bacitracin-polymyxin (NEOSPORIN) OINT Apply 1 application topically 3 (three) times daily. Patient not taking: Reported on 02/10/2018 08/19/16    Osvaldo Shipper, MD  nicotine (NICODERM CQ - DOSED IN MG/24 HOURS) 21 mg/24hr patch Place 1 patch (21 mg total) onto the skin daily. Patient not taking: Reported on 02/10/2018 08/20/16   Osvaldo Shipper, MD  polyethylene glycol powder (GLYCOLAX/MIRALAX) powder Take 34 g by mouth 2 (two) times daily as needed. Take until you have a bowel movement. Patient not taking: Reported on 02/10/2018 11/12/15   Melancon, Hillery Hunter, MD  risperiDONE (RISPERDAL) 1 MG tablet Take 1 mg by mouth 2 (two) times daily.    [provider]  triamcinolone cream (KENALOG) 0.1 % Apply 1 application topically 2 (two) times daily. Patient not taking: Reported on 02/10/2018 08/15/16   Bethel Born, PA-C    Physical Exam: Vitals:   03/04/18 0520 03/04/18 0658 03/04/18 0800 03/04/18 0810  BP: (!) 201/100 (!) 167/125 (!) 170/92 (!) 195/85  Pulse: 97 (!) 102 91 62  Resp: 20 (!) 24 (!) 22 (!) 24  SpO2: 99% 100% 99% 99%  Weight:      Height:         General:  Appears agitated and confused Eyes: PERRL, EOMI, normal lids, iris ENT:   LAD, masses or thyromegaly Cardiovascular:  RRR, no m/r/g. No LE edema.  Tachycardic Respiratory: CTA bilaterally, no w/r/r. Normal respiratory effort. Abdomen:  soft, ntnd, NABS Skin:  no rash or induration seen on limited exam Musculoskeletal:  grossly normal tone BUE/BLE, good ROM, no bony abnormality Psychiatric: Patient very agitated and confused and in restraints  neurologic:  moving all extremities Labs on Admission: I have personally reviewed following labs and imaging studies  CBC: Recent Labs  Lab 03/03/18 2116  WBC 19.2*  HGB 11.8*  HCT 36.2  MCV 91.0  PLT 397   Basic Metabolic Panel: Recent Labs  Lab 03/03/18 2116  NA 140  K 2.7*  CL 106  CO2 23  GLUCOSE 132*  BUN 16  CREATININE 0.60  CALCIUM 8.9   GFR: Estimated Creatinine Clearance: 66.5 mL/min (by C-G formula based on SCr of 0.6 mg/dL). Liver Function Tests: Recent Labs  Lab 03/03/18 2116   AST 26  ALT 19  ALKPHOS 88  BILITOT 0.6  PROT 7.2  ALBUMIN 3.8   No results for input(s): LIPASE, AMYLASE in the last 168 hours. No results for input(s): AMMONIA in the last 168 hours. Coagulation Profile: No results for input(s): INR, PROTIME in the last 168 hours. Cardiac Enzymes: No results for input(s): CKTOTAL, CKMB, CKMBINDEX, TROPONINI in the last 168 hours. BNP (last 3 results) No results for input(s): PROBNP in the last 8760 hours. HbA1C: No results for input(s): HGBA1C in the last 72 hours. CBG: No results for input(s): GLUCAP in the last 168 hours. Lipid Profile: No results for input(s): CHOL, HDL, LDLCALC, TRIG, CHOLHDL, LDLDIRECT in the last 72 hours. Thyroid Function Tests: No results for input(s): TSH, T4TOTAL, FREET4, T3FREE, THYROIDAB in the last 72 hours. Anemia Panel: No results for input(s): VITAMINB12, FOLATE, FERRITIN, TIBC, IRON, RETICCTPCT in the last 72 hours. Urine analysis:    Component Value Date/Time   COLORURINE YELLOW 02/10/2018 2348   APPEARANCEUR HAZY (A) 02/10/2018 2348   LABSPEC 1.013 02/10/2018 2348   PHURINE 5.0 02/10/2018 2348   GLUCOSEU NEGATIVE 02/10/2018 2348   HGBUR LARGE (A) 02/10/2018 2348   BILIRUBINUR NEGATIVE 02/10/2018 2348   KETONESUR NEGATIVE 02/10/2018 2348   PROTEINUR NEGATIVE 02/10/2018 2348   UROBILINOGEN 1.0 04/02/2015 0752   NITRITE NEGATIVE 02/10/2018 2348   LEUKOCYTESUR NEGATIVE 02/10/2018 2348    Creatinine Clearance: Estimated Creatinine Clearance: 66.5 mL/min (by C-G formula based on SCr of 0.6 mg/dL).  Sepsis Labs: @LABRCNTIP (procalcitonin:4,lacticidven:4) )No results found for this or any previous visit (from the past 240 hour(s)).   Radiological Exams on Admission: No results found.  EKG: Independently reviewed.   Assessment/Plan Active Problems:   * No active hospital problems. *   1] multidrug overdose in a patient with a history of recent drug overdose 2 weeks prior to this admission.  Urine  drug screen positive for opiates, cocaine, benzos.  Patient was treated symptomatically in the ER with Ativan, Benadryl.  She received Haldol and Versed on the way to the ER.  Currently she is in restraints as she was hitting the staff and pulling out the telemetry.  We will continue her on PRN Ativan, start IV fluids, check CPK, recheck labs again this morning.  2] hypokalemia recheck levels this morning.  She was given 80 mEq of potassium for a K of 2.7.  Check magnesium.  3] leukocytosis probably reactive versus hemoconcentration, no fever documented, will obtain UA and chest x-ray.  4] history of seizures patient on Lamictal as an outpatient.  Will treat her with Ativan as needed restart Lamictal once she is able to take p.o.  5] hypertension/tachycardia-we will start her on IV hydralazine.  Avoid beta-blockers with the cocaine use.  Restart Home medications when she is able to take p.o.place Catapres patch  6] tobacco abuse order nicotine patch  7] anxiety patient takes BuSpar, Depakote, Atarax, Risperdal.   DVT prophylaxis: lovenox Code Status: full Family Communication:none Disposition Plan:tbd Consults called:none  Admission status:  In patient  Alwyn Ren MD [facility]  If 7PM-7AM, please contact night-coverage [email or web address] Password [email or web address]  03/04/2018, 8:20 AM

## 2018-03-05 DIAGNOSIS — R748 Abnormal levels of other serum enzymes: Secondary | ICD-10-CM

## 2018-03-05 DIAGNOSIS — D72829 Elevated white blood cell count, unspecified: Secondary | ICD-10-CM

## 2018-03-05 DIAGNOSIS — T50901A Poisoning by unspecified drugs, medicaments and biological substances, accidental (unintentional), initial encounter: Principal | ICD-10-CM

## 2018-03-05 DIAGNOSIS — E876 Hypokalemia: Secondary | ICD-10-CM

## 2018-03-05 LAB — COMPREHENSIVE METABOLIC PANEL
ALT: 27 U/L (ref 14–54)
ANION GAP: 8 (ref 5–15)
AST: 62 U/L — ABNORMAL HIGH (ref 15–41)
Albumin: 3.6 g/dL (ref 3.5–5.0)
Alkaline Phosphatase: 91 U/L (ref 38–126)
BUN: 11 mg/dL (ref 6–20)
CALCIUM: 9 mg/dL (ref 8.9–10.3)
CHLORIDE: 110 mmol/L (ref 101–111)
CO2: 20 mmol/L — AB (ref 22–32)
Creatinine, Ser: 0.73 mg/dL (ref 0.44–1.00)
GFR calc non Af Amer: >60 mL/min (ref >60)
Glucose, Bld: 99 mg/dL (ref 65–99)
Potassium: 3.7 mmol/L (ref 3.5–5.1)
SODIUM: 138 mmol/L (ref 135–145)
Total Bilirubin: 0.8 mg/dL (ref 0.3–1.2)
Total Protein: 6.9 g/dL (ref 6.5–8.1)

## 2018-03-05 LAB — CBC
HCT: 38.1 % (ref 36.0–46.0)
Hemoglobin: 12.4 g/dL (ref 12.0–15.0)
MCH: 29.8 pg (ref 26.0–34.0)
MCHC: 32.5 g/dL (ref 30.0–36.0)
MCV: 91.6 fL (ref 78.0–100.0)
PLATELETS: 370 10*3/uL (ref 150–400)
RBC: 4.16 MIL/uL (ref 3.87–5.11)
RDW: 16.9 % — AB (ref 11.5–15.5)
WBC: 9.7 10*3/uL (ref 4.0–10.5)

## 2018-03-05 LAB — T3, FREE: T3 FREE: 2.8 pg/mL (ref 2.0–4.4)

## 2018-03-05 LAB — CK: Total CK: 1137 U/L — ABNORMAL HIGH (ref 38–234)

## 2018-03-05 MED ORDER — DEXTROSE-NACL 5-0.45 % IV SOLN
INTRAVENOUS | Status: DC
  Administered 2018-03-05: 08:00:00 via INTRAVENOUS

## 2018-03-05 MED ORDER — HYDRALAZINE HCL 20 MG/ML IJ SOLN
10.0000 mg | INTRAMUSCULAR | Status: DC | PRN
  Filled 2018-03-05: qty 1

## 2018-03-05 MED ORDER — HYDRALAZINE HCL 20 MG/ML IJ SOLN
20.0000 mg | INTRAMUSCULAR | Status: DC | PRN
  Administered 2018-03-05: 20 mg via INTRAVENOUS

## 2018-03-05 MED ORDER — HYDRALAZINE HCL 20 MG/ML IJ SOLN
10.0000 mg | Freq: Once | INTRAMUSCULAR | Status: AC
  Administered 2018-03-05: 10 mg via INTRAVENOUS
  Filled 2018-03-05: qty 1

## 2018-03-05 NOTE — Progress Notes (Signed)
PROGRESS NOTE    Ariana Jensen  WGN:[id number] DOB: Oct 05, 1965 DOA: 03/03/2018 PCP: Lavinia Sharps, NP   Brief Narrative: Ariana Jensen is a 52 y.o. female with a history of polysubstance abuse, hypertension, GERD, hepatitis C, tobacco abuse.  Patient presented secondary to unintentional overdose.   Assessment & Plan:   Active Problems:   PVC's (premature ventricular contractions)   OD (overdose of drug)   Leukocytosis   Hypokalemia   Drug overdose Patient with cocaine, opiate, benzodiazepine positive urine drug screen.  Alcohol level undetectable.  Patient very agitated yesterday but appears to be improved today.  Required restraints. -Continue CIWA -Start COWS  Elevated CK Patient with significantly elevated CK on last admission. Likely down from previous admission. No kidney injury.  Leukocytosis Resolved. No evidence of infection.  Hypokalemia Resolved with supplementation.   DVT prophylaxis: Lovenox Code Status:   Code Status: Full Code Family Communication: None at bedside Disposition Plan: Discharge in 1-2 days when no withdrawal symptoms/confusion   Consultants:   None  Procedures:   None  Antimicrobials:  None    Subjective: Agitated overnight.  Objective: Vitals:   03/05/18 0527 03/05/18 0600 03/05/18 0700 03/05/18 0730  BP: (!) 169/117 (!) 151/97 (!) 179/114   Pulse:  (!) 102 (!) 112   Resp:  (!) 31 (!) 26   Temp:    97.6 F (36.4 C)  TempSrc:    Oral  SpO2:  99% 100%   Weight:      Height:        Intake/Output Summary (Last 24 hours) at 03/05/2018 0749 Last data filed at 03/04/2018 1904 Gross per 24 hour  Intake 837.5 ml  Output 500 ml  Net 337.5 ml   Filed Weights   03/03/18 2033 03/04/18 0856 03/04/18 1100  Weight: 56.7 kg (125 lb) 54.9 kg (121 lb 0.5 oz) 54.9 kg (121 lb 0.5 oz)    Examination:  General exam: Appears calm and comfortable Respiratory system: Clear to auscultation. Respiratory effort  normal. Cardiovascular system: S1 & S2 heard, Tachycardia, normal rhythm. No murmurs, rubs, gallops or clicks. Gastrointestinal system: Abdomen is nondistended, soft and nontender. Normal bowel sounds heard. Central nervous system: Asleep but arouses to voice easily. Extremities: No edema. No calf tenderness Skin: No cyanosis. No rashes    Data Reviewed: I have personally reviewed following labs and imaging studies  CBC: Recent Labs  Lab 03/03/18 2116 03/04/18 0848 03/05/18 0329  WBC 19.2* 9.5 9.7  HGB 11.8* 12.1 12.4  HCT 36.2 37.7 38.1  MCV 91.0 90.6 91.6  PLT 397 432* 370   Basic Metabolic Panel: Recent Labs  Lab 03/03/18 2116 03/04/18 0848 03/05/18 0329  NA 140 142 138  K 2.7* 3.9 3.7  CL 106 110 110  CO2 23 23 20*  GLUCOSE 132* 130* 99  BUN 16 16 11   CREATININE 0.60 0.57 0.73  CALCIUM 8.9 9.4 9.0  MG  --  1.8  --    GFR: Estimated Creatinine Clearance: 66.5 mL/min (by C-G formula based on SCr of 0.73 mg/dL). Liver Function Tests: Recent Labs  Lab 03/03/18 2116 03/04/18 0848 03/05/18 0329  AST 26 47* 62*  ALT 19 23 27   ALKPHOS 88 90 91  BILITOT 0.6 0.3 0.8  PROT 7.2 7.4 6.9  ALBUMIN 3.8 3.8 3.6   No results for input(s): LIPASE, AMYLASE in the last 168 hours. No results for input(s): AMMONIA in the last 168 hours. Coagulation Profile: No results for input(s): INR, PROTIME in the last  168 hours. Cardiac Enzymes: Recent Labs  Lab 03/04/18 0848 03/05/18 0329  CKTOTAL 1,421* 1,137*  TROPONINI <0.03  --    BNP (last 3 results) No results for input(s): PROBNP in the last 8760 hours. HbA1C: No results for input(s): HGBA1C in the last 72 hours. CBG: No results for input(s): GLUCAP in the last 168 hours. Lipid Profile: No results for input(s): CHOL, HDL, LDLCALC, TRIG, CHOLHDL, LDLDIRECT in the last 72 hours. Thyroid Function Tests: Recent Labs    03/04/18 0848  TSH 0.332*  FREET4 1.09  T3FREE 2.8   Anemia Panel: No results for input(s):  VITAMINB12, FOLATE, FERRITIN, TIBC, IRON, RETICCTPCT in the last 72 hours. Sepsis Labs: No results for input(s): PROCALCITON, LATICACIDVEN in the last 168 hours.  Recent Results (from the past 240 hour(s))  MRSA PCR Screening     Status: Abnormal   Collection Time: 03/04/18 11:03 AM  Result Value Ref Range Status   MRSA by PCR POSITIVE (A) NEGATIVE Final    Comment:        The GeneXpert MRSA Assay (FDA approved for NASAL specimens only), is one component of a comprehensive MRSA colonization surveillance program. It is not intended to diagnose MRSA infection nor to guide or monitor treatment for MRSA infections. RESULT CALLED TO, READ BACK BY AND VERIFIED WITH: KOTIAN,S @ 1255 ON [id number] BY POTEAT,S Performed at [facility], [address] [place], [place] [id number]          Radiology Studies: Dg Chest 1 View  Result Date: 03/04/2018 CLINICAL DATA:  Leukocytosis and altered mental status EXAM: CHEST  1 VIEW COMPARISON:  02/12/2018 FINDINGS: The heart size and mediastinal contours are within normal limits. Both lungs are clear. The visualized skeletal structures are unremarkable. IMPRESSION: No active disease. Electronically Signed   By: Alcide CleverMark  Lukens M.D.   On: 03/04/2018 09:57        Scheduled Meds: . chlorhexidine  15 mL Mouth Rinse BID  . cloNIDine  0.2 mg Transdermal Weekly  . enoxaparin (LOVENOX) injection  40 mg Subcutaneous Q24H  . mouth rinse  15 mL Mouth Rinse q12n4p  . mupirocin ointment   Nasal BID  . nicotine  21 mg Transdermal Daily   Continuous Infusions: . sodium chloride 250 mL/hr at 03/05/18 0604     LOS: 1 day     Jacquelin Hawkingalph Jeramie Scogin, MD [facility] 03/05/2018, 7:49 AM Pager: [phone]  If 7PM-7AM, please contact night-coverage [email or web address] 03/05/2018, 7:49 AM

## 2018-03-05 NOTE — Progress Notes (Signed)
Patient alert and oriented, patient released out of restraints. Patient requesting to leave AMA. MD aware. Patient able to walk around hallway with assistance. Will have IV access removed before leaving. Will continue to monitor and assess.

## 2018-03-05 NOTE — Discharge Summary (Signed)
Physician Discharge Summary  Ariana Jensen ZOX:[id number] DOB: 05/04/1966 DOA: 03/03/2018  PCP: Lavinia Sharps, NP  Admit date: 03/03/2018 Discharge date: 03/05/2018  Admitted From: Home Disposition: Left AMA  Recommendations for Outpatient Follow-up:  1. Patient left AMA  Brief/Interim Summary:  Admission HPI written by Alwyn Ren, MD   Chief Complaint: Drug overdose  HPI: Ariana Jensen is a 52 y.o. female with medical history significant of hepatitis C, narcotic abuse, heroin abuse, cocaine abuse, fibromyalgia, history of seizure disorder, hypertension admitted with multidrug overdose.  When I saw her she was in restraints trying to pull off the telemetry leads.  But she was able to tell me that she overdosed and that she overdosed 2 weeks ago and went to a rehab.  She does not know where she is right now.  She could not answer anymore questions that was being asked.  When I asked her if she has family she says sometimes.  She was admitted to the hospital 523 and discharged 02/13/2018 AGAINST MEDICAL ADVICE.  She was admitted with drug overdose and fall and left ankle fracture found to have AKI with a creatinine of 6.37 during that admission and her creatinine came down to 0.5 with IV hydration.  Had elevated liver function test had MRCP done showed stone in the gallbladder neck she also had elevated CPK of 17,000 during the work-up patient left AGAINST MEDICAL ADVICE.  Per ER physician note soon as she came into the ER last night patient reported that she thought she was injecting heroin but started twitching in her legs and felt very bad .  ED Course: Patient was given Benadryl, Ativan, magnesium, potassium a total of 80 mEq.  Her vital signs were 170/92 which went up to 195/85 with a pulse of 76 up to 104 and respiration of 24 saturating 99% on room air.  Her labs showed a sodium of 140 potassium 2.7 BUN 16 creatinine 0.60 alkaline phosphatase 88 AST 26 ALT 19,  WBC 19.2 hemoglobin 11.8 platelet count 397.  Urine drug screen was positive for benzodiazepines, opiates, cocaine.  Alcohol level was less than 10 salicylate level was less than 7 acetaminophen level was less than 10.  EKG showed sinus tach, multiple PVCs, QT prolongation 548.  EMS gave her Versed and Haldol prior to arrival to the ER.    Hospital course:  Patient watched overnight.  She was given clonidine for hypertension in addition to hydralazine PRN.  She is given Ativan for agitation.  Plan was to continue to watch her over 1 extra night, controlling her blood pressure, and her agitation.  Patient has decided that she would like to leave AMA.   Discharge Diagnoses:  Principal Problem:   OD (overdose of drug) Active Problems:   PVC's (premature ventricular contractions)   Leukocytosis   Hypokalemia    Discharge Instructions   No Known Allergies  Consultations:  None   Procedures/Studies: Dg Chest 1 View  Result Date: 03/04/2018 CLINICAL DATA:  Leukocytosis and altered mental status EXAM: CHEST  1 VIEW COMPARISON:  02/12/2018 FINDINGS: The heart size and mediastinal contours are within normal limits. Both lungs are clear. The visualized skeletal structures are unremarkable. IMPRESSION: No active disease. Electronically Signed   By: Alcide Clever M.D.   On: 03/04/2018 09:57   Dg Chest 2 View  Result Date: 02/10/2018 CLINICAL DATA:  Syncope and fall from overdose. EXAM: CHEST - 2 VIEW COMPARISON:  12/31/2017 and prior radiographs FINDINGS: The cardiomediastinal silhouette  is unremarkable. Mild bibasilar atelectasis noted. There is no evidence of focal airspace disease, pulmonary edema, suspicious pulmonary nodule/mass, pleural effusion, or pneumothorax. No acute bony abnormalities are identified. IMPRESSION: Mild bibasilar atelectasis. Electronically Signed   By: Harmon Pier M.D.   On: 02/10/2018 21:22   Dg Ankle Complete Left  Result Date: 02/10/2018 CLINICAL DATA:  Question  fall from standing. Left lateral ankle pain and swelling. EXAM: LEFT ANKLE COMPLETE - 3+ VIEW COMPARISON:  None. FINDINGS: Oblique mildly displaced distal fibular fracture at the level of the ankle mortise. No mortise widening. Lateral soft tissue edema. No additional fracture of the ankle. Overall alignment is maintained. IMPRESSION: Oblique mildly displaced distal fibular fracture at the level of the ankle mortise. Electronically Signed   By: Rubye Oaks M.D.   On: 02/10/2018 21:23   US Abdomen Complete  Result Date: 02/11/2018 CLINICAL DATA:  Acute renal failure and elevated LFT EXAM: ABDOMEN ULTRASOUND COMPLETE COMPARISON:  None. FINDINGS: Gallbladder: Stone measuring up to 1.4 cm. Small amount of echogenic sludge. Increased wall thickness at 6 mm. Negative sonographic Murphy. Contracted gallbladder Common bile duct: Diameter: Dilated up to 8.8 mm Liver: Increased hepatic echogenicity. No focal abnormality. Mild intra hepatic biliary dilatation. Portal vein is patent on color Doppler imaging with normal direction of blood flow towards the liver. IVC: No abnormality visualized. Pancreas: Obscured by bowel gas Spleen: Size and appearance within normal limits. Right Kidney: Length: 8.6 cm, may be under measured. Lower pole obscured by gas. Echogenicity within normal limits. No mass or hydronephrosis visualized. Left Kidney: Length: 11.5 cm. Echogenicity within normal limits. No mass or hydronephrosis visualized. Abdominal aorta: Obscured by bowel gas Other findings: None. IMPRESSION: 1. Sludge and stones within the gallbladder. Increased wall thickness, partially related to gallbladder contraction. Negative sonographic Murphy. Findings are nonspecific and could be secondary to acute or chronic cholecystitis, or be seen in the setting of liver disease. Further evaluation with nuclear medicine hepatobiliary imaging could be obtained if there is concern for acute cholecystitis. 2. Slightly dilated  extrahepatic bile duct up to 8.8 mm with mild intra hepatic biliary dilatation. Further evaluation with MRCP suggested. 3. Slight increased hepatic echogenicity suggesting mild steatosis. 4. Aorta and pancreas are obscured by bowel gas. 5. No hydronephrosis Electronically Signed   By: Jasmine Pang M.D.   On: 02/11/2018 01:32   Mr 3d Recon At Scanner  Result Date: 02/12/2018 CLINICAL DATA:  Cholelithiasis. EXAM: MRI ABDOMEN WITHOUT AND WITH CONTRAST (INCLUDING MRCP) TECHNIQUE: Multiplanar multisequence MR imaging of the abdomen was performed both before and after the administration of intravenous contrast. Heavily T2-weighted images of the biliary and pancreatic ducts were obtained, and three-dimensional MRCP images were rendered by post processing. CONTRAST:  12mL MULTIHANCE GADOBENATE DIMEGLUMINE 529 MG/ML IV SOLN COMPARISON:  Ultrasound 02/11/2018. FINDINGS: Lower chest: No acute findings. Hepatobiliary: No focal liver abnormality identified. Stone within the neck of gallbladder measures 9 mm. Mild gallbladder wall prominence with trace pericholecystic fluid. The common bile duct measures up to 7 mm. No choledocholithiasis identified. Pancreas: No mass, inflammatory changes, or other parenchymal abnormality identified. Spleen:  Within normal limits in size and appearance. Adrenals/Urinary Tract:  Normal appearance of the adrenal glands. No kidney mass or hydronephrosis noted. Stomach/Bowel: The stomach appears nondistended. Within the right lower quadrant of the abdomen there are dilated loops of small bowel which measure up to 3.1 cm. There is mild wall thickening affecting the dilated small bowel loops. No pathologic dilatation of the visualized portions of the  colon. Vascular/Lymphatic: Aortic atherosclerosis. No aneurysm. The portal vein and hepatic veins appear patent. No upper abdominal adenopathy identified. Other:  No free fluid or fluid collections. Musculoskeletal: No abnormal signal from within the  bone marrow. IMPRESSION: 1. Gallstones. A 9 mm stone is noted within the neck of the gallbladder. There is mild nonspecific gallbladder wall prominence and trace pericholecystic fluid. 2. No evidence for common bile duct stone. 3. Abnormal dilatation of right lower quadrant small bowel loops with mild wall thickening. These are only partially visualized on this exam. Cannot rule out small bowel obstruction. Suggest further evaluation with either KUB of the abdomen or CT of the abdomen pelvis with oral and IV contrast material. 4.  Aortic Atherosclerosis (ICD10-I70.0). Electronically Signed   By: Signa Kell M.D.   On: 02/12/2018 13:39   Dg Abd Acute W/chest  Result Date: 02/12/2018 CLINICAL DATA:  Abdominal pain and bloating EXAM: DG ABDOMEN ACUTE W/ 1V CHEST COMPARISON:  Chest radiograph Feb 10, 2018; abdominal MRI Feb 12, 2018 FINDINGS: PA chest: There is atelectatic change in the bases. Lungs elsewhere are clear. Heart size and pulmonary vascularity are normal. No adenopathy. Supine and upright abdomen: There is moderate stool in the colon. There is no appreciable bowel dilatation or air-fluid level to suggest bowel obstruction. No evident free air. There are vascular calcifications in the pelvis. IMPRESSION: Moderate stool in colon. No demonstrable bowel obstruction or free air evident. There is bibasilar lung atelectatic change. No edema or consolidation. Electronically Signed   By: Bretta Bang III M.D.   On: 02/12/2018 16:23   Mr Abdomen Mrcp Vivien Rossetti Contast  Result Date: 02/12/2018 CLINICAL DATA:  Cholelithiasis. EXAM: MRI ABDOMEN WITHOUT AND WITH CONTRAST (INCLUDING MRCP) TECHNIQUE: Multiplanar multisequence MR imaging of the abdomen was performed both before and after the administration of intravenous contrast. Heavily T2-weighted images of the biliary and pancreatic ducts were obtained, and three-dimensional MRCP images were rendered by post processing. CONTRAST:  12mL MULTIHANCE GADOBENATE  DIMEGLUMINE 529 MG/ML IV SOLN COMPARISON:  Ultrasound 02/11/2018. FINDINGS: Lower chest: No acute findings. Hepatobiliary: No focal liver abnormality identified. Stone within the neck of gallbladder measures 9 mm. Mild gallbladder wall prominence with trace pericholecystic fluid. The common bile duct measures up to 7 mm. No choledocholithiasis identified. Pancreas: No mass, inflammatory changes, or other parenchymal abnormality identified. Spleen:  Within normal limits in size and appearance. Adrenals/Urinary Tract:  Normal appearance of the adrenal glands. No kidney mass or hydronephrosis noted. Stomach/Bowel: The stomach appears nondistended. Within the right lower quadrant of the abdomen there are dilated loops of small bowel which measure up to 3.1 cm. There is mild wall thickening affecting the dilated small bowel loops. No pathologic dilatation of the visualized portions of the colon. Vascular/Lymphatic: Aortic atherosclerosis. No aneurysm. The portal vein and hepatic veins appear patent. No upper abdominal adenopathy identified. Other:  No free fluid or fluid collections. Musculoskeletal: No abnormal signal from within the bone marrow. IMPRESSION: 1. Gallstones. A 9 mm stone is noted within the neck of the gallbladder. There is mild nonspecific gallbladder wall prominence and trace pericholecystic fluid. 2. No evidence for common bile duct stone. 3. Abnormal dilatation of right lower quadrant small bowel loops with mild wall thickening. These are only partially visualized on this exam. Cannot rule out small bowel obstruction. Suggest further evaluation with either KUB of the abdomen or CT of the abdomen pelvis with oral and IV contrast material. 4.  Aortic Atherosclerosis (ICD10-I70.0). Electronically Signed   By:  Signa Kellaylor  Stroud M.D.   On: 02/12/2018 13:39      Subjective: Wants to leave AMA  Discharge Exam: Vitals:   03/05/18 1140 03/05/18 1200  BP: (!) 190/100 (!) 161/120  Pulse: 100 (!) 45    Resp: (!) 28 (!) 21  Temp:  97.7 F (36.5 C)  SpO2: 100% 100%   Vitals:   03/05/18 1100 03/05/18 1131 03/05/18 1140 03/05/18 1200  BP: (!) 198/136 (!) 212/125 (!) 190/100 (!) 161/120  Pulse: 99 86 100 (!) 45  Resp: (!) 29 (!) 27 (!) 28 (!) 21  Temp:    97.7 F (36.5 C)  TempSrc:    Oral  SpO2: 100% 100% 100% 100%  Weight:      Height:         The results of significant diagnostics from this hospitalization (including imaging, microbiology, ancillary and laboratory) are listed below for reference.     Microbiology: Recent Results (from the past 240 hour(s))  MRSA PCR Screening     Status: Abnormal   Collection Time: 03/04/18 11:03 AM  Result Value Ref Range Status   MRSA by PCR POSITIVE (A) NEGATIVE Final    Comment:        The GeneXpert MRSA Assay (FDA approved for NASAL specimens only), is one component of a comprehensive MRSA colonization surveillance program. It is not intended to diagnose MRSA infection nor to guide or monitor treatment for MRSA infections. RESULT CALLED TO, READ BACK BY AND VERIFIED WITH: KOTIAN,S @ 1255 ON [id number] BY POTEAT,S Performed at [facility], [address] [place], [place] [id number]      Labs: BNP (last 3 results) Recent Labs    05/12/17 1916  BNP 91.5   Basic Metabolic Panel: Recent Labs  Lab 03/03/18 2116 03/04/18 0848 03/05/18 0329  NA 140 142 138  K 2.7* 3.9 3.7  CL 106 110 110  CO2 23 23 20*  GLUCOSE 132* 130* 99  BUN 16 16 11   CREATININE 0.60 0.57 0.73  CALCIUM 8.9 9.4 9.0  MG  --  1.8  --    Liver Function Tests: Recent Labs  Lab 03/03/18 2116 03/04/18 0848 03/05/18 0329  AST 26 47* 62*  ALT 19 23 27   ALKPHOS 88 90 91  BILITOT 0.6 0.3 0.8  PROT 7.2 7.4 6.9  ALBUMIN 3.8 3.8 3.6   No results for input(s): LIPASE, AMYLASE in the last 168 hours. No results for input(s): AMMONIA in the last 168 hours. CBC: Recent Labs  Lab 03/03/18 2116 03/04/18 0848 03/05/18 0329  WBC  19.2* 9.5 9.7  HGB 11.8* 12.1 12.4  HCT 36.2 37.7 38.1  MCV 91.0 90.6 91.6  PLT 397 432* 370   Cardiac Enzymes: Recent Labs  Lab 03/04/18 0848 03/05/18 0329  CKTOTAL 1,421* 1,137*  TROPONINI <0.03  --    BNP: Invalid input(s): POCBNP CBG: No results for input(s): GLUCAP in the last 168 hours. D-Dimer No results for input(s): DDIMER in the last 72 hours. Hgb A1c No results for input(s): HGBA1C in the last 72 hours. Lipid Profile No results for input(s): CHOL, HDL, LDLCALC, TRIG, CHOLHDL, LDLDIRECT in the last 72 hours. Thyroid function studies Recent Labs    03/04/18 0848  TSH 0.332*  T3FREE 2.8   Anemia work up No results for input(s): VITAMINB12, FOLATE, FERRITIN, TIBC, IRON, RETICCTPCT in the last 72 hours. Urinalysis    Component Value Date/Time   COLORURINE YELLOW 03/03/2018 2347   APPEARANCEUR CLEAR 03/03/2018 2347  LABSPEC 1.021 03/03/2018 2347   PHURINE 6.0 03/03/2018 2347   GLUCOSEU NEGATIVE 03/03/2018 2347   HGBUR NEGATIVE 03/03/2018 2347   BILIRUBINUR NEGATIVE 03/03/2018 2347   KETONESUR 5 (A) 03/03/2018 2347   PROTEINUR NEGATIVE 03/03/2018 2347   UROBILINOGEN 1.0 04/02/2015 0752   NITRITE NEGATIVE 03/03/2018 2347   LEUKOCYTESUR NEGATIVE 03/03/2018 2347   Sepsis Labs Invalid input(s): PROCALCITONIN,  WBC,  LACTICIDVEN Microbiology Recent Results (from the past 240 hour(s))  MRSA PCR Screening     Status: Abnormal   Collection Time: 03/04/18 11:03 AM  Result Value Ref Range Status   MRSA by PCR POSITIVE (A) NEGATIVE Final    Comment:        The GeneXpert MRSA Assay (FDA approved for NASAL specimens only), is one component of a comprehensive MRSA colonization surveillance program. It is not intended to diagnose MRSA infection nor to guide or monitor treatment for MRSA infections. RESULT CALLED TO, READ BACK BY AND VERIFIED WITH: KOTIAN,S @ 1255 ON [id number] BY POTEAT,S Performed at [facility], [address]  [place], [place] [address]      SIGNED:   Jacquelin Hawking, MD [facility] 03/05/2018, 12:46 PM

## 2018-03-05 NOTE — Progress Notes (Signed)
Pt adamant about leaving AMA. Pt alert and oriented to place self and situation. Pt educated on reasons she was not being discharged, and why it was not in her best interest. Pt ambulated with assistance around hallway. MD made aware. AMA papers signed.

## 2018-06-21 ENCOUNTER — Encounter (HOSPITAL_COMMUNITY): Payer: Self-pay | Admitting: *Deleted

## 2018-06-21 ENCOUNTER — Emergency Department (HOSPITAL_COMMUNITY)
Admission: EM | Admit: 2018-06-21 | Discharge: 2018-06-21 | Disposition: A | Discharge status: Other Acute Inpatient Hospital | Attending: Emergency Medicine | Admitting: Emergency Medicine

## 2018-06-21 ENCOUNTER — Inpatient Hospital Stay (HOSPITAL_COMMUNITY)
Admission: AD | Admit: 2018-06-21 | Discharge: 2018-06-29 | DRG: 885 | Disposition: A | Discharge status: Home / Self Care | Payer: Federal, State, Local not specified - Other | Source: Intra-hospital | Attending: Psychiatry | Admitting: Psychiatry

## 2018-06-21 ENCOUNTER — Other Ambulatory Visit: Payer: Self-pay

## 2018-06-21 ENCOUNTER — Encounter (HOSPITAL_COMMUNITY): Payer: Self-pay | Admitting: Emergency Medicine

## 2018-06-21 DIAGNOSIS — F17210 Nicotine dependence, cigarettes, uncomplicated: Secondary | ICD-10-CM | POA: Diagnosis present

## 2018-06-21 DIAGNOSIS — F4310 Post-traumatic stress disorder, unspecified: Secondary | ICD-10-CM | POA: Diagnosis present

## 2018-06-21 DIAGNOSIS — Z590 Homelessness: Secondary | ICD-10-CM

## 2018-06-21 DIAGNOSIS — F1910 Other psychoactive substance abuse, uncomplicated: Secondary | ICD-10-CM

## 2018-06-21 DIAGNOSIS — F332 Major depressive disorder, recurrent severe without psychotic features: Secondary | ICD-10-CM

## 2018-06-21 DIAGNOSIS — R45851 Suicidal ideations: Secondary | ICD-10-CM | POA: Diagnosis present

## 2018-06-21 DIAGNOSIS — F1210 Cannabis abuse, uncomplicated: Secondary | ICD-10-CM | POA: Diagnosis present

## 2018-06-21 DIAGNOSIS — I10 Essential (primary) hypertension: Secondary | ICD-10-CM | POA: Insufficient documentation

## 2018-06-21 DIAGNOSIS — Z91410 Personal history of adult physical and sexual abuse: Secondary | ICD-10-CM | POA: Diagnosis not present

## 2018-06-21 DIAGNOSIS — F314 Bipolar disorder, current episode depressed, severe, without psychotic features: Principal | ICD-10-CM | POA: Diagnosis present

## 2018-06-21 DIAGNOSIS — G4700 Insomnia, unspecified: Secondary | ICD-10-CM | POA: Diagnosis not present

## 2018-06-21 DIAGNOSIS — Z915 Personal history of self-harm: Secondary | ICD-10-CM | POA: Diagnosis not present

## 2018-06-21 DIAGNOSIS — Z79899 Other long term (current) drug therapy: Secondary | ICD-10-CM | POA: Insufficient documentation

## 2018-06-21 DIAGNOSIS — Z8249 Family history of ischemic heart disease and other diseases of the circulatory system: Secondary | ICD-10-CM

## 2018-06-21 DIAGNOSIS — F1410 Cocaine abuse, uncomplicated: Secondary | ICD-10-CM | POA: Diagnosis present

## 2018-06-21 DIAGNOSIS — F419 Anxiety disorder, unspecified: Secondary | ICD-10-CM | POA: Diagnosis not present

## 2018-06-21 DIAGNOSIS — F1120 Opioid dependence, uncomplicated: Secondary | ICD-10-CM | POA: Diagnosis present

## 2018-06-21 HISTORY — DX: Major depressive disorder, recurrent severe without psychotic features: F33.2

## 2018-06-21 LAB — COMPREHENSIVE METABOLIC PANEL
ALT: 21 U/L (ref 0–44)
ANION GAP: 12 (ref 5–15)
AST: 29 U/L (ref 15–41)
Albumin: 4.2 g/dL (ref 3.5–5.0)
Alkaline Phosphatase: 70 U/L (ref 38–126)
BUN: 20 mg/dL (ref 6–20)
CHLORIDE: 106 mmol/L (ref 98–111)
CO2: 24 mmol/L (ref 22–32)
Calcium: 9.5 mg/dL (ref 8.9–10.3)
Creatinine, Ser: 0.66 mg/dL (ref 0.44–1.00)
GFR calc Af Amer: >60 mL/min (ref >60)
GFR calc non Af Amer: >60 mL/min (ref >60)
Glucose, Bld: 93 mg/dL (ref 70–99)
Potassium: 4.1 mmol/L (ref 3.5–5.1)
Sodium: 142 mmol/L (ref 135–145)
Total Bilirubin: 0.5 mg/dL (ref 0.3–1.2)
Total Protein: 8.1 g/dL (ref 6.5–8.1)

## 2018-06-21 LAB — RAPID URINE DRUG SCREEN, HOSP PERFORMED
Amphetamines: NOT DETECTED
BENZODIAZEPINES: NOT DETECTED
Barbiturates: NOT DETECTED
Cocaine: POSITIVE — AB
Opiates: POSITIVE — AB
Tetrahydrocannabinol: POSITIVE — AB

## 2018-06-21 LAB — CBC
HCT: 45.4 % (ref 36.0–46.0)
Hemoglobin: 14.8 g/dL (ref 12.0–15.0)
MCH: 29.1 pg (ref 26.0–34.0)
MCHC: 32.6 g/dL (ref 30.0–36.0)
MCV: 89.2 fL (ref 78.0–100.0)
PLATELETS: 298 10*3/uL (ref 150–400)
RBC: 5.09 MIL/uL (ref 3.87–5.11)
RDW: 14.9 % (ref 11.5–15.5)
WBC: 9.4 10*3/uL (ref 4.0–10.5)

## 2018-06-21 LAB — PREGNANCY, URINE: PREG TEST UR: NEGATIVE

## 2018-06-21 LAB — SALICYLATE LEVEL: Salicylate Lvl: <7 mg/dL (ref 2.8–30.0)

## 2018-06-21 LAB — ACETAMINOPHEN LEVEL: Acetaminophen (Tylenol), Serum: <10 ug/mL — ABNORMAL LOW (ref 10–30)

## 2018-06-21 LAB — ETHANOL: Alcohol, Ethyl (B): <10 mg/dL (ref <10)

## 2018-06-21 MED ORDER — LISINOPRIL 20 MG PO TABS
20.0000 mg | ORAL_TABLET | Freq: Every day | ORAL | Status: DC
  Administered 2018-06-21: 20 mg via ORAL
  Filled 2018-06-21: qty 1

## 2018-06-21 MED ORDER — ACETAMINOPHEN 325 MG PO TABS
650.0000 mg | ORAL_TABLET | Freq: Four times a day (QID) | ORAL | Status: DC | PRN
  Administered 2018-06-22 – 2018-06-28 (×12): 650 mg via ORAL
  Filled 2018-06-21 (×15): qty 2

## 2018-06-21 MED ORDER — ONDANSETRON HCL 4 MG PO TABS: 2.0000 mg | ORAL_TABLET | Freq: Three times a day (TID) | ORAL | Status: DC | PRN

## 2018-06-21 MED ORDER — TRAZODONE HCL 50 MG PO TABS
50.0000 mg | ORAL_TABLET | Freq: Every evening | ORAL | Status: DC | PRN
  Administered 2018-06-21 – 2018-06-28 (×8): 50 mg via ORAL
  Filled 2018-06-21 (×3): qty 1
  Filled 2018-06-21: qty 2
  Filled 2018-06-21: qty 1
  Filled 2018-06-21: qty 7
  Filled 2018-06-21 (×3): qty 1

## 2018-06-21 MED ORDER — METHOCARBAMOL 500 MG PO TABS
500.0000 mg | ORAL_TABLET | Freq: Three times a day (TID) | ORAL | Status: AC | PRN
  Administered 2018-06-21 – 2018-06-26 (×10): 500 mg via ORAL
  Filled 2018-06-21 (×11): qty 1

## 2018-06-21 MED ORDER — CLONIDINE HCL 0.1 MG PO TABS
0.1000 mg | ORAL_TABLET | Freq: Four times a day (QID) | ORAL | Status: AC
  Administered 2018-06-21 – 2018-06-24 (×8): 0.1 mg via ORAL
  Filled 2018-06-21 (×14): qty 1

## 2018-06-21 MED ORDER — NICOTINE 21 MG/24HR TD PT24
21.0000 mg | MEDICATED_PATCH | Freq: Every day | TRANSDERMAL | Status: DC
  Administered 2018-06-22 – 2018-06-29 (×8): 21 mg via TRANSDERMAL
  Filled 2018-06-21 (×10): qty 1

## 2018-06-21 MED ORDER — CLONIDINE HCL 0.1 MG PO TABS
0.2000 mg | ORAL_TABLET | Freq: Every day | ORAL | Status: DC
  Administered 2018-06-21: 0.2 mg via ORAL
  Filled 2018-06-21: qty 2

## 2018-06-21 MED ORDER — MAGNESIUM HYDROXIDE 400 MG/5ML PO SUSP
30.0000 mL | Freq: Every day | ORAL | Status: DC | PRN
  Administered 2018-06-23 – 2018-06-25 (×2): 30 mL via ORAL
  Filled 2018-06-21 (×2): qty 30

## 2018-06-21 MED ORDER — CLONIDINE HCL 0.1 MG PO TABS
0.1000 mg | ORAL_TABLET | ORAL | Status: AC
  Administered 2018-06-24 – 2018-06-26 (×4): 0.1 mg via ORAL
  Filled 2018-06-21 (×4): qty 1

## 2018-06-21 MED ORDER — ENSURE ENLIVE PO LIQD
237.0000 mL | Freq: Two times a day (BID) | ORAL | Status: DC
  Administered 2018-06-22 – 2018-06-28 (×12): 237 mL via ORAL

## 2018-06-21 MED ORDER — HYDROCHLOROTHIAZIDE 25 MG PO TABS
25.0000 mg | ORAL_TABLET | Freq: Every day | ORAL | Status: DC
  Administered 2018-06-21: 25 mg via ORAL
  Filled 2018-06-21: qty 1

## 2018-06-21 MED ORDER — NAPROXEN 500 MG PO TABS
500.0000 mg | ORAL_TABLET | Freq: Two times a day (BID) | ORAL | Status: DC | PRN
  Administered 2018-06-21 – 2018-06-25 (×6): 500 mg via ORAL
  Filled 2018-06-21 (×6): qty 1

## 2018-06-21 MED ORDER — CLONIDINE HCL 0.1 MG PO TABS
0.1000 mg | ORAL_TABLET | Freq: Every day | ORAL | Status: DC
  Filled 2018-06-21 (×2): qty 1

## 2018-06-21 MED ORDER — ALUM & MAG HYDROXIDE-SIMETH 200-200-20 MG/5ML PO SUSP: 30.0000 mL | ORAL | Status: DC | PRN

## 2018-06-21 MED ORDER — AMLODIPINE BESYLATE 5 MG PO TABS
10.0000 mg | ORAL_TABLET | Freq: Every day | ORAL | Status: DC
  Administered 2018-06-21: 10 mg via ORAL
  Filled 2018-06-21: qty 2
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
## 2018-06-21 NOTE — ED Triage Notes (Signed)
Pt brought in by [employer]. Pt stated that she was at family services because she has been having overwhelming thoughts. Pt states that she feels suicidal and homicidal. Pt states her plan is to either take a lot of pills or overdose on heroin. Pt reports last using heroin yesterday. Pt calm, cooperative, and tearful.

## 2018-06-21 NOTE — ED Notes (Signed)
Pt transported to [facility] by [facility] transportation service for continuation of specialized care. Belongings given to driver after patient signed for them. Pt left in no acute distress. 

## 2018-06-21 NOTE — ED Notes (Signed)
Pt to room #40. Pt behavior cooperative, reports "feeling overwhelmed" Endorsing SI without plan. Denies HI/AVH. Encouragement and support provided. Special checks q 15 mins in place for safety, Video monitoring in place. Will continue to monitor.

## 2018-06-21 NOTE — Progress Notes (Signed)
TTS consulted with Ariana Chamber, NP who recommends inpt treatment. [facility] to review for possible admission. TTS calling [facility] to advise of disposition, no answer on [phone] or [phone].

## 2018-06-21 NOTE — ED Notes (Signed)
This nurse notified EDP of pt VS.  

## 2018-06-21 NOTE — ED Provider Notes (Signed)
[facility]-EMERGENCY DEPT Provider Note   CSN: [id number] Arrival date & time: 06/21/18  1350     History   Chief Complaint Chief Complaint  Patient presents with  . Suicidal  . Homicidal    HPI Ariana Jensen is a 52 y.o. female.  Patient with hx anxiety/depression, c/o worsening depression in the past few weeks with suicidal ideation. Symptoms mod-severe, progressive, worsening. Denies overdose or attempt at self harm. Denies specific inciting event or stressor, says 'everything'. Denies etoh abuse or other current drug use - notes hx drug use. States off all meds for several weeks. Normal appetite. Notes crying all the time, thoughts of suicide, denies specific plan.   The history is provided by the patient.    Past Medical History:  Diagnosis Date  . Anxiety   . ASCUS (atypical squamous cells of undetermined significance) on Pap smear   . Chronic lower back pain   . Cocaine abuse (HCC)    crack cocaine  . Depression   . Fibromyalgia   . GERD (gastroesophageal reflux disease)   . Hepatitis C   . Hepatitis C antibody test positive   . Hypertension   . Hypokalemia 03/04/2018  . Leukocytosis 03/04/2018  . Opioid dependence (HCC)   . PVC's (premature ventricular contractions) 03/04/2018  . Seizures (HCC)    "don't know why; I've had 2 in the past year" (08/18/2016)  . Trichomonas 04/17/2011   Diagnosed 04/02/11 during hospitalization, treated with Flagyl 2g, patient instructed to have partner treated (must follow-up)   . Tuberculosis    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 age 65, now s/p 1-yr of multidrug treatment (dates unknown)    Patient Active Problem List   Diagnosis Date Noted  . PVC's (premature ventricular contractions) 03/04/2018  . OD (overdose of drug) 03/04/2018  . Leukocytosis 03/04/2018  . Hypokalemia 03/04/2018  . Acute drug intoxication with complication (HCC)   . Acute urinary retention 02/11/2018  . Acute renal failure  (ARF) (HCC) 02/10/2018  . Closed fracture of left distal fibula 02/10/2018  . Withdrawal complaint 05/12/2017  . Symptomatic bradycardia 05/12/2017  . Acute kidney injury (HCC) 08/18/2016  . Boils 08/18/2016  . Rhabdomyolysis 08/18/2016  . Urinary retention 08/18/2016  . ARF (acute renal failure) (HCC) 08/18/2016  . AKI (acute kidney injury) (HCC) 08/17/2016  . Hallucination 08/17/2016  . Nausea & vomiting 10/04/2015  . Pruritic dermatitis 09/10/2015  . Rash and nonspecific skin eruption 08/21/2015  . Body lice 08/05/2015  . Prolonged QT interval 07/18/2015  . Dysmenorrhea 04/10/2015  . Trichimoniasis 04/10/2015  . Chest pain 03/07/2015  . Healthcare maintenance 03/07/2015  . RLQ abdominal pain 02/18/2012  . GERD (gastroesophageal reflux disease) 01/28/2012  . Menorrhagia 01/25/2012  . Weakness generalized 12/05/2011  . Tobacco abuse 11/06/2011  . HTN (hypertension) 10/22/2011  . Abscess and cellulitis 10/22/2011  . Hepatitis C, chronic (HCC) 10/22/2011  . History of TB (tuberculosis) 10/22/2011  . Opioid dependence (HCC)   . Polysubstance abuse (HCC) 04/17/2011    Past Surgical History:  Procedure Laterality Date  . CESAREAN SECTION  1984  . DILATION AND CURETTAGE OF UTERUS    . I&D EXTREMITY  10/18/2011   Procedure: IRRIGATION AND DEBRIDEMENT EXTREMITY;  Surgeon: Sharma Covert, MD;  Location: [facility] OR;  Service: Orthopedics;  Laterality: Left;  . I&D EXTREMITY  10/21/2011   Procedure: IRRIGATION AND DEBRIDEMENT EXTREMITY;  Surgeon: Sharma Covert, MD;  Location: [facility] OR;  Service: Orthopedics;  Laterality: Left;  . SALPINGECTOMY  Left March 2010   ectopic pregnancy     OB History   None      Home Medications    Prior to Admission medications   Medication Sig Start Date End Date Taking? Authorizing Provider  amLODipine (NORVASC) 10 MG tablet Take 1 tablet (10 mg total) by mouth daily. 07/27/16   Ardith Dark, MD  busPIRone (BUSPAR) 15 MG tablet Take 15 mg by mouth  2 (two) times daily.    [provider]  camphor-menthol Wynelle Fanny) lotion Apply 1 application topically as needed for itching. Patient not taking: Reported on 03/04/2018 07/27/16   Ardith Dark, MD  cetirizine (ZYRTEC) 10 MG tablet Take 1 tablet (10 mg total) by mouth daily. 07/27/16   Ardith Dark, MD  cloNIDine (CATAPRES) 0.2 MG tablet Take 0.2 mg by mouth daily.     [provider]  divalproex (DEPAKOTE) 250 MG DR tablet Take 250 mg by mouth 2 (two) times daily.    [provider]  ferrous sulfate 325 (65 FE) MG tablet Take 325 mg by mouth daily with breakfast.    [provider]  gabapentin (NEURONTIN) 400 MG capsule Take 300 mg by mouth 2 (two) times daily.     [provider]  hydrochlorothiazide (HYDRODIURIL) 25 MG tablet Take 1 tablet (25 mg total) by mouth daily. 04/02/16   Ardith Dark, MD  hydrOXYzine (ATARAX/VISTARIL) 50 MG tablet Take 50 mg by mouth 2 (two) times daily as needed for anxiety.    [provider]  lamoTRIgine (LAMICTAL) 25 MG tablet Take 25 mg by mouth 2 (two) times daily.    [provider]  lisinopril (PRINIVIL,ZESTRIL) 40 MG tablet Take 40 mg by mouth daily.    [provider]  methocarbamol (ROBAXIN) 500 MG tablet Take 1 tablet (500 mg total) by mouth 2 (two) times daily. Patient not taking: Reported on 02/10/2018 07/27/16   Jaynie Crumble, PA-C  naproxen (NAPROSYN) 500 MG tablet Take 1 tablet (500 mg total) by mouth 2 (two) times daily. Patient not taking: Reported on 02/10/2018 11/26/16   Alvina Chou, [place]  neomycin-bacitracin-polymyxin (NEOSPORIN) OINT Apply 1 application topically 3 (three) times daily. Patient not taking: Reported on 02/10/2018 08/19/16   Osvaldo Shipper, MD  nicotine (NICODERM CQ - DOSED IN MG/24 HOURS) 21 mg/24hr patch Place 1 patch (21 mg total) onto the skin daily. Patient not taking: Reported on 02/10/2018 08/20/16   Osvaldo Shipper, MD  polyethylene glycol  powder (GLYCOLAX/MIRALAX) powder Take 34 g by mouth 2 (two) times daily as needed. Take until you have a bowel movement. Patient not taking: Reported on 02/10/2018 11/12/15   Melancon, Hillery Hunter, MD  risperiDONE (RISPERDAL) 1 MG tablet Take 1 mg by mouth 2 (two) times daily.    [provider]  triamcinolone cream (KENALOG) 0.1 % Apply 1 application topically 2 (two) times daily. Patient not taking: Reported on 02/10/2018 08/15/16   Bethel Born, PA-C    Family History Family History  Problem Relation Age of Onset  . Heart attack Mother   . Heart attack Father   . Heart attack Paternal Grandmother   . Heart attack Paternal Grandfather   . Cirrhosis Brother     Social History Social History   Tobacco Use  . Smoking status: Current Every Day Smoker    Packs/day: 0.50    Years: 32.00    Pack years: 16.00    Types: Cigarettes  . Smokeless tobacco: Never Used  Substance  Use Topics  . Alcohol use: Yes    Alcohol/week: 0.0 standard drinks    Comment: occ  . Drug use: Yes    Types: Oxycodone, Heroin, Cocaine, IV     Allergies   Patient has no known allergies.   Review of Systems Review of Systems  Constitutional: Negative for fever.  HENT: Negative for sore throat.   Eyes: Negative for visual disturbance.  Respiratory: Negative for shortness of breath.   Cardiovascular: Negative for chest pain.  Gastrointestinal: Negative for abdominal pain.  Genitourinary: Negative for flank pain.  Musculoskeletal: Negative for neck pain.  Skin: Negative for rash.  Neurological: Negative for weakness, numbness and headaches.  Hematological: Does not bruise/bleed easily.  Psychiatric/Behavioral: Positive for dysphoric mood and suicidal ideas. The patient is nervous/anxious.      Physical Exam Updated Vital Signs BP (!) 164/112 (BP Location: Right Arm)   Pulse 72   Resp 17   Ht 1.6 m (5\' 3" )   Wt 43.1 kg   LMP 12/06/2015   SpO2 99%   BMI 16.83 kg/m   Physical Exam   Constitutional: She is oriented to person, place, and time. She appears well-developed and well-nourished.  HENT:  Mouth/Throat: Oropharynx is clear and moist.  Eyes: Conjunctivae are normal. No scleral icterus.  Neck: Neck supple. No tracheal deviation present.  Cardiovascular: Normal rate.  Pulmonary/Chest: Effort normal. No respiratory distress.  Abdominal: Normal appearance. She exhibits no distension.  Musculoskeletal: She exhibits no edema.  Neurological: She is alert and oriented to person, place, and time.  Speech clear/fluent. Ambulates w steady gait.   Skin: Skin is warm and dry. No rash noted.  Psychiatric:  Depressed mood. +SI.  Nursing note and vitals reviewed.    ED Treatments / Results  Labs (all labs ordered are listed, but only abnormal results are displayed) Results for orders placed or performed during the hospital encounter of 06/21/18  Comprehensive metabolic panel  Result Value Ref Range   Sodium 142 135 - 145 mmol/L   Potassium 4.1 3.5 - 5.1 mmol/L   Chloride 106 98 - 111 mmol/L   CO2 24 22 - 32 mmol/L   Glucose, Bld 93 70 - 99 mg/dL   BUN 20 6 - 20 mg/dL   Creatinine, Ser 1.30 0.44 - 1.00 mg/dL   Calcium 9.5 8.9 - 86.5 mg/dL   Total Protein 8.1 6.5 - 8.1 g/dL   Albumin 4.2 3.5 - 5.0 g/dL   AST 29 15 - 41 U/L   ALT 21 0 - 44 U/L   Alkaline Phosphatase 70 38 - 126 U/L   Total Bilirubin 0.5 0.3 - 1.2 mg/dL   GFR calc non Af Amer >60 >60 mL/min   GFR calc Af Amer >60 >60 mL/min   Anion gap 12 5 - 15  Ethanol  Result Value Ref Range   Alcohol, Ethyl (B) <10 <10 mg/dL  Salicylate level  Result Value Ref Range   Salicylate Lvl <7.0 2.8 - 30.0 mg/dL  Acetaminophen level  Result Value Ref Range   Acetaminophen (Tylenol), Serum <10 (L) 10 - 30 ug/mL  cbc  Result Value Ref Range   WBC 9.4 4.0 - 10.5 K/uL   RBC 5.09 3.87 - 5.11 MIL/uL   Hemoglobin 14.8 12.0 - 15.0 g/dL   HCT 78.4 69.6 - 29.5 %   MCV 89.2 78.0 - 100.0 fL   MCH 29.1 26.0 - 34.0 pg     MCHC 32.6 30.0 - 36.0 g/dL   RDW  14.9 11.5 - 15.5 %   Platelets 298 150 - 400 K/uL  Rapid urine drug screen (hospital performed)  Result Value Ref Range   Opiates POSITIVE (A) NONE DETECTED   Cocaine POSITIVE (A) NONE DETECTED   Benzodiazepines NONE DETECTED NONE DETECTED   Amphetamines NONE DETECTED NONE DETECTED   Tetrahydrocannabinol POSITIVE (A) NONE DETECTED   Barbiturates NONE DETECTED NONE DETECTED  Pregnancy, urine  Result Value Ref Range   Preg Test, Ur NEGATIVE NEGATIVE   EKG None  Radiology No results found.  Procedures Procedures (including critical care time)  Medications Ordered in ED Medications - No data to display   Initial Impression / Assessment and Plan / ED Course  I have reviewed the triage vital signs and the nursing notes.  Pertinent labs & imaging results that were available during my care of the patient were reviewed by me and considered in my medical decision making (see chart for details).  Labs sent.   Reviewed nursing notes and prior charts for additional history.   Will start home meds as bp high, hx same.   Labs reviewed - chem normal. uds c/w polysubstance abuse.   BH team consulted.   Disposition per [facility] team.    Final Clinical Impressions(s) / ED Diagnoses   Final diagnoses:  None    ED Discharge Orders    None       Cathren Laine, MD 06/21/18 [phone]

## 2018-06-21 NOTE — Progress Notes (Signed)
Psychoeducational Group Note  Date:  06/21/2018 Time:  2341  Group Topic/Focus:  Wrap-Up Group:   The focus of this group is to help patients review their daily goal of treatment and discuss progress on daily workbooks.  Participation Level: Did Not Attend  Participation Quality:  Not Applicable  Affect:  Not Applicable  Cognitive:  Not Applicable  Insight:  Not Applicable  Engagement in Group: Not Applicable  Additional Comments:  The patient did not attend group since she was not admitted until later in the evening.   Hazle Coca S 06/21/2018, 11:41 PM

## 2018-06-21 NOTE — ED Notes (Signed)
Bed: [facility] Expected date: 06/21/18 Expected time:  Means of arrival:  Comments: tr5

## 2018-06-21 NOTE — ED Notes (Signed)
Pt alert and oriented. Pt complains of chronic back pain. Pt cooperative, and quietly resting in bed. Pt states she is suicidal, with a plan to OD on drugs. Pt preparing for transfer to behavior health. Pt stable will continue to monitor.
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
## 2018-06-21 NOTE — ED Notes (Signed)
Pt informs this nurse she is coming off  Heroin.  This nurse notified EDP .

## 2018-06-21 NOTE — ED Notes (Signed)
Pt states that she has not had her BP meds in "quite some time." States that BP presented is her baseline without meds

## 2018-06-22 DIAGNOSIS — F419 Anxiety disorder, unspecified: Secondary | ICD-10-CM

## 2018-06-22 DIAGNOSIS — R45851 Suicidal ideations: Secondary | ICD-10-CM

## 2018-06-22 DIAGNOSIS — R45850 Homicidal ideations: Secondary | ICD-10-CM

## 2018-06-22 DIAGNOSIS — F17210 Nicotine dependence, cigarettes, uncomplicated: Secondary | ICD-10-CM

## 2018-06-22 DIAGNOSIS — F1120 Opioid dependence, uncomplicated: Secondary | ICD-10-CM

## 2018-06-22 DIAGNOSIS — G4700 Insomnia, unspecified: Secondary | ICD-10-CM

## 2018-06-22 DIAGNOSIS — F4310 Post-traumatic stress disorder, unspecified: Secondary | ICD-10-CM | POA: Diagnosis present

## 2018-06-22 DIAGNOSIS — F314 Bipolar disorder, current episode depressed, severe, without psychotic features: Principal | ICD-10-CM

## 2018-06-22 DIAGNOSIS — Z91410 Personal history of adult physical and sexual abuse: Secondary | ICD-10-CM

## 2018-06-22 LAB — LIPID PANEL
Cholesterol: 181 mg/dL (ref 0–200)
HDL: 55 mg/dL (ref >40)
LDL CALC: 83 mg/dL (ref 0–99)
Total CHOL/HDL Ratio: 3.3 RATIO
Triglycerides: 216 mg/dL — ABNORMAL HIGH (ref <150)
VLDL: 43 mg/dL — ABNORMAL HIGH (ref 0–40)

## 2018-06-22 LAB — TSH: TSH: 0.349 u[IU]/mL — AB (ref 0.350–4.500)

## 2018-06-22 MED ORDER — HYDROXYZINE HCL 50 MG PO TABS
50.0000 mg | ORAL_TABLET | Freq: Four times a day (QID) | ORAL | Status: DC | PRN
  Administered 2018-06-22 – 2018-06-29 (×8): 50 mg via ORAL
  Filled 2018-06-22 (×5): qty 1
  Filled 2018-06-22: qty 10
  Filled 2018-06-22 (×5): qty 1

## 2018-06-22 MED ORDER — RISPERIDONE 1 MG PO TABS
1.0000 mg | ORAL_TABLET | Freq: Two times a day (BID) | ORAL | Status: DC
  Administered 2018-06-22 – 2018-06-29 (×14): 1 mg via ORAL
  Filled 2018-06-22 (×3): qty 1
  Filled 2018-06-22: qty 14
  Filled 2018-06-22 (×13): qty 1
  Filled 2018-06-22: qty 14

## 2018-06-22 MED ORDER — DIVALPROEX SODIUM 500 MG PO DR TAB
500.0000 mg | DELAYED_RELEASE_TABLET | Freq: Two times a day (BID) | ORAL | Status: DC
  Administered 2018-06-22 – 2018-06-29 (×14): 500 mg via ORAL
  Filled 2018-06-22 (×9): qty 1
  Filled 2018-06-22: qty 14
  Filled 2018-06-22 (×8): qty 1
  Filled 2018-06-22: qty 14

## 2018-06-22 NOTE — [facility] Group Notes (Signed)
Adult Psychoeducational Group Note  Date:  06/22/2018 Time:  9:31 PM  Group Topic/Focus:  Wrap-Up Group:   The focus of this group is to help patients review their daily goal of treatment and discuss progress on daily workbooks.  Participation Level:  Did Not Attend  Participation Quality:  Did not attend  Affect:  did not attend  Cognitive:  did not attend  Insight: None  Engagement in Group:  did not attend  Modes of Intervention:  did not attend  Additional Comments:  Did not attend  Lyndee Hensen 06/22/2018, 9:31 PM

## 2018-06-22 NOTE — [facility] Group Notes (Signed)
[facility] Group Notes:  (Nursing/MHT/Case Management/Adjunct)  Date:  06/22/2018  Time:  4:00 pm  Type of Therapy:  Psychoeducational Skills  Participation Level:  Did Not Attend  Participation Quality:    Affect:    Cognitive:    Insight:    Engagement in Group:    Modes of Intervention:    Summary of Progress/Problems:  Ariana Jensen 06/22/2018, 6:30 PM

## 2018-06-22 NOTE — Progress Notes (Signed)
Recreation Therapy Notes  Date: 10.2.19 Time: 0930 Location: [address] Dayroom  Group Topic: Stress Management  Goal Area(s) Addresses:  Patient will verbalize importance of using healthy stress management.  Patient will identify positive emotions associated with healthy stress management.   Intervention: Stress Management  Activity :  Guided Imagery.  LRT introduced the stress management technique of guided imagery.  LRT read a script taking the patients on a journey on the beach at sunrise.  Patients were to listen and follow along as script was read.  Education:  Stress Management, Discharge Planning.   Education Outcome: Acknowledges edcuation/In group clarification offered/Needs additional education  Clinical Observations/Feedback: Pt did not attend group.    Caroll Rancher, LRT/CTRS         Caroll Rancher A 06/22/2018 11:27 AM

## 2018-06-22 NOTE — Tx Team (Signed)
Interdisciplinary Treatment and Diagnostic Plan Update  06/22/2018 Time of Session:  Ariana Jensen MRN: [id number]  Principal Diagnosis: <principal problem not specified>  Secondary Diagnoses: Active Problems:   Severe recurrent major depression without psychotic features (HCC)   Current Medications:  Current Facility-Administered Medications  Medication Dose Route Frequency Provider Last Rate Last Dose  . acetaminophen (TYLENOL) tablet 650 mg  650 mg Oral Q6H PRN Lindon Romp A, NP   650 mg at 06/22/18 0820  . alum & mag hydroxide-simeth (MAALOX/MYLANTA) 200-200-20 MG/5ML suspension 30 mL  30 mL Oral Q4H PRN Lindon Romp A, NP      . cloNIDine (CATAPRES) tablet 0.1 mg  0.1 mg Oral QID Lindon Romp A, NP   0.1 mg at 06/22/18 0831   Followed by  . [START ON 06/24/2018] cloNIDine (CATAPRES) tablet 0.1 mg  0.1 mg Oral BH-qamhs Rozetta Nunnery, NP       Followed by  . [START ON 06/27/2018] cloNIDine (CATAPRES) tablet 0.1 mg  0.1 mg Oral QAC breakfast Lindon Romp A, NP      . feeding supplement (ENSURE ENLIVE) (ENSURE ENLIVE) liquid 237 mL  237 mL Oral BID BM Cobos, Fernando A, MD      . magnesium hydroxide (MILK OF MAGNESIA) suspension 30 mL  30 mL Oral Daily PRN Lindon Romp A, NP      . methocarbamol (ROBAXIN) tablet 500 mg  500 mg Oral Q8H PRN Lindon Romp A, NP   500 mg at 06/22/18 0819  . naproxen (NAPROSYN) tablet 500 mg  500 mg Oral BID PRN Lindon Romp A, NP   500 mg at 06/21/18 2144  . nicotine (NICODERM CQ - dosed in mg/24 hours) patch 21 mg  21 mg Transdermal Daily Cobos, Myer Peer, MD   21 mg at 06/22/18 0819  . traZODone (DESYREL) tablet 50 mg  50 mg Oral QHS PRN Rozetta Nunnery, NP   50 mg at 06/21/18 2143   PTA Medications: Medications Prior to Admission  Medication Sig Dispense Refill Last Dose  . amLODipine (NORVASC) 10 MG tablet Take 1 tablet (10 mg total) by mouth daily. 90 tablet 3 Past Month at Unknown time  . busPIRone (BUSPAR) 15 MG tablet Take 15 mg by mouth 2  (two) times daily.   Past Month at Unknown time  . cloNIDine (CATAPRES) 0.2 MG tablet Take 0.2 mg by mouth daily.    Past Month at Unknown time  . divalproex (DEPAKOTE) 250 MG DR tablet Take 250 mg by mouth 2 (two) times daily.   Past Month at Unknown time  . gabapentin (NEURONTIN) 300 MG capsule Take 300 mg by mouth 3 (three) times daily.   Past Month at Unknown time  . hydrochlorothiazide (HYDRODIURIL) 25 MG tablet Take 1 tablet (25 mg total) by mouth daily. 90 tablet 0 Past Month at Unknown time  . hydrOXYzine (ATARAX/VISTARIL) 50 MG tablet Take 50 mg by mouth 2 (two) times daily as needed for anxiety.   Past Month at Unknown time  . lamoTRIgine (LAMICTAL) 25 MG tablet Take 25 mg by mouth 2 (two) times daily.   Past Month at Unknown time  . lisinopril (PRINIVIL,ZESTRIL) 40 MG tablet Take 40 mg by mouth daily.   Past Month at Unknown time  . risperiDONE (RISPERDAL) 1 MG tablet Take 1 mg by mouth 2 (two) times daily.   Past Month at Unknown time  . triamcinolone cream (KENALOG) 0.1 % Apply 1 application topically 2 (two) times daily. (Patient not taking:  Reported on 06/21/2018) 30 g 0 Not Taking at Unknown time    Patient Stressors: Financial difficulties Health problems Marital or family conflict Substance abuse  Patient Strengths: Ability for insight Average or above average intelligence Capable of independent living [employer] of knowledge Motivation for treatment/growth  Treatment Modalities: Medication Management, Group therapy, Case management,  1 to 1 session with clinician, Psychoeducation, Recreational therapy.   Physician Treatment Plan for Primary Diagnosis: <principal problem not specified> Long Term Goal(s):     Short Term Goals:    Medication Management: Evaluate patient's response, side effects, and tolerance of medication regimen.  Therapeutic Interventions: 1 to 1 sessions, Unit Group sessions and Medication administration.  Evaluation of Outcomes: Not  Met  Physician Treatment Plan for Secondary Diagnosis: Active Problems:   Severe recurrent major depression without psychotic features ([place])  Long Term Goal(s):     Short Term Goals:       Medication Management: Evaluate patient's response, side effects, and tolerance of medication regimen.  Therapeutic Interventions: 1 to 1 sessions, Unit Group sessions and Medication administration.  Evaluation of Outcomes: Not Met   RN Treatment Plan for Primary Diagnosis: <principal problem not specified> Long Term Goal(s): Knowledge of disease and therapeutic regimen to maintain health will improve  Short Term Goals: Ability to participate in decision making will improve, Ability to disclose and discuss suicidal ideas, Ability to identify and develop effective coping behaviors will improve and Compliance with prescribed medications will improve  Medication Management: RN will administer medications as ordered by provider, will assess and evaluate patient's response and provide education to patient for prescribed medication. RN will report any adverse and/or side effects to prescribing provider.  Therapeutic Interventions: 1 on 1 counseling sessions, Psychoeducation, Medication administration, Evaluate responses to treatment, Monitor vital signs and CBGs as ordered, Perform/monitor CIWA, COWS, AIMS and Fall Risk screenings as ordered, Perform wound care treatments as ordered.  Evaluation of Outcomes: Not Met   LCSW Treatment Plan for Primary Diagnosis: <principal problem not specified> Long Term Goal(s): Safe transition to appropriate next level of care at discharge, Engage patient in therapeutic group addressing interpersonal concerns.  Short Term Goals: Engage patient in aftercare planning with referrals and resources  Therapeutic Interventions: Assess for all discharge needs, 1 to 1 time with Social worker, Explore available resources and support systems, Assess for adequacy in community  support network, Educate family and significant other(s) on suicide prevention, Complete Psychosocial Assessment, Interpersonal group therapy.  Evaluation of Outcomes: Not Met   Progress in Treatment: Attending groups: No. Participating in groups: No. Taking medication as prescribed: Yes. Toleration medication: No. Family/Significant other contact made: No, will contact:  if patient consents for collateral contacts Patient understands diagnosis: Yes. Discussing patient identified problems/goals with staff: Yes. Medical problems stabilized or resolved: Yes. Denies suicidal/homicidal ideation: No. Issues/concerns per patient self-inventory: No. Other:   New problem(s) identified: None  New Short Term/Long Term Goal(s):medication stabilization, elimination of SI thoughts, development of comprehensive mental wellness plan.   Patient Goals:  Help me get better.   Discharge Plan or Barriers: CSW will assess for appropriate referrals and discharge planning.   Reason for Continuation of Hospitalization: Depression Medication stabilization Suicidal ideation  Estimated Length of Stay:3-5 days   Attendees: Patient: 06/22/2018 8:48 AM  Physician: Dr. Maris Berger, MD 06/22/2018 8:48 AM  Nursing: Nicoletta Dress.Viona Gilmore, RN 06/22/2018 8:48 AM  RN Care Manager: Jackelyn Knife, RN 06/22/2018 8:48 AM  Social Worker: Radonna Ricker, [place] 06/22/2018 8:48 AM  Recreational Therapist: Rhunette Croft  06/22/2018 8:48 AM  Other: Tana Conch 06/22/2018 8:48 AM  Other: Karren Cobble 06/22/2018 8:48 AM  Other: 06/22/2018 8:48 AM    Scribe for Treatment Team: Marylee Floras, [place] 06/22/2018 8:48 AM
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
## 2018-06-22 NOTE — Progress Notes (Signed)
Nursing Progress Note: 7p-7a D: Pt currently presents with a anxious/pleasant affect and behavior. Pt states "I would be suicidal if I don't get any of these pain under control. I can't get rid of this pain." Interacting appropriately with the milieu. Pt reports good sleep during the previous night with current medication regimen. Pt did not attend wrap-up group.  A: Pt provided with medications per providers orders. Pt's labs and vitals were monitored throughout the night. Pt supported emotionally and encouraged to express concerns and questions. Pt educated on medications.  R: Pt's safety ensured with 15 minute and environmental checks. Pt currently denies HI and AVH and endorses passive SI. Pt verbally contracts to seek staff if SI,HI, or AVH occurs and to consult with staff before acting on any harmful thoughts. Will continue to monitor.

## 2018-06-22 NOTE — [facility] Group Notes (Signed)
LCSW Group Therapy Note 06/22/2018 1:03 PM  Type of Therapy/Topic: Group Therapy: Feelings about Diagnosis  Participation Level: Did Not Attend   Description of Group:  This group will allow patients to explore their thoughts and feelings about diagnoses they have received. Patients will be guided to explore their level of understanding and acceptance of these diagnoses. Facilitator will encourage patients to process their thoughts and feelings about the reactions of others to their diagnosis and will guide patients in identifying ways to discuss their diagnosis with significant others in their lives. This group will be process-oriented, with patients participating in exploration of their own experiences, giving and receiving support, and processing challenge from other group members.  Therapeutic Goals: 1. Patient will demonstrate understanding of diagnosis as evidenced by identifying two or more symptoms of the disorder 2. Patient will be able to express two feelings regarding the diagnosis 3. Patient will demonstrate their ability to communicate their needs through discussion and/or role play  Summary of Patient Progress:  Invited, chose not to attend.     Therapeutic Modalities:  Cognitive Behavioral Therapy Brief Therapy Feelings Identification    Junita Kubota LCSWA Clinical Social Worker   

## 2018-06-22 NOTE — Plan of Care (Signed)
Problem: Safety: Goal: Periods of time without injury will increase Outcome: Progressing   Problem: Medication: Goal: Compliance with prescribed medication regimen will improve Outcome: Progressing DAR Note: Pt awake in bed at this time. Denies HI, AVH and pain. Endorsed passive SI "if I keep hurting like that, I will want to hurt myself". Pt is demanding and medication seeking on every interactions "I want my medicines, I'm hurting, I take Gabapentin on the streets, I need something strong for my pain". Pt is restless / fidgety. Rates her depression 10/10, hopelessness 2/10 and anxiety 10/10. Pt attended 30% of groups this shift.  Verbal education done on medication schedule related to pt's continuous demands for medication, no evidence of learning. Scheduled medications given as ordered. Emotional support and encouragement offered to pt. Safety checks continues without self harm gestures or outburst to note thus far.  Pt receptive to care. Remains medication compliant. Denies adverse drug reactions when assessed. POC maintained for safety and mood stability.
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
## 2018-06-22 NOTE — Progress Notes (Signed)
NUTRITION ASSESSMENT  Pt identified as at risk on the Malnutrition Screen Tool  INTERVENTION: 1. Supplements: continue Ensure Enlive po BID, each supplement provides 350 kcal and 20 grams of protein  NUTRITION DIAGNOSIS: Unintentional weight loss related to sub-optimal intake as evidenced by pt report.   Goal: Pt to meet >/= 90% of their estimated nutrition needs.  Monitor:  PO intake  Assessment:  Pt admitted with depression and heroin abuse. Pt reports no desire to eat. Pt weighed 125 lb on 5/26, has lost 17 lb since then then (14% wt loss x 4 months, significant for time frame). Ensure supplements have been ordered, will continue.   Height: Ht Readings from Last 1 Encounters:  06/21/18 5\' 3"  (1.6 m)    Weight: Wt Readings from Last 1 Encounters:  06/21/18 49 kg    Weight Hx: Wt Readings from Last 10 Encounters:  06/21/18 49 kg  06/21/18 43.1 kg  03/04/18 54.9 kg  02/13/18 56.7 kg  12/31/17 44 kg  06/06/17 63.5 kg  05/13/17 58.8 kg  11/26/16 64 kg  08/19/16 66.4 kg  07/27/16 66.2 kg    BMI:  Body mass index is 19.13 kg/m. Pt meets criteria for normal based on current BMI.  Estimated Nutritional Needs: Kcal: 25-30 kcal/kg Protein: > 1 gram protein/kg Fluid: 1 ml/kcal  Diet Order:  Diet Order            Diet regular Room service appropriate? Yes; Fluid consistency: Thin  Diet effective now             Pt is also offered choice of unit snacks mid-morning and mid-afternoon.  Pt is eating as desired.   Lab results and medications reviewed.   Tilda Franco, MS, RD, LDN [facility] Inpatient Clinical Dietitian Pager: [phone] After Hours Pager: [phone]

## 2018-06-23 MED ORDER — GABAPENTIN 300 MG PO CAPS: 300.0000 mg | ORAL_CAPSULE | Freq: Three times a day (TID) | ORAL | Status: DC

## 2018-06-23 MED ORDER — BUSPIRONE HCL 15 MG PO TABS
15.0000 mg | ORAL_TABLET | Freq: Two times a day (BID) | ORAL | Status: DC
  Administered 2018-06-23 – 2018-06-29 (×12): 15 mg via ORAL
  Filled 2018-06-23 (×7): qty 1
  Filled 2018-06-23: qty 14
  Filled 2018-06-23 (×2): qty 1
  Filled 2018-06-23: qty 14
  Filled 2018-06-23 (×5): qty 1

## 2018-06-23 MED ORDER — BUSPIRONE HCL 15 MG PO TABS: 15.0000 mg | ORAL_TABLET | Freq: Two times a day (BID) | ORAL | Status: DC

## 2018-06-23 MED ORDER — GABAPENTIN 300 MG PO CAPS
300.0000 mg | ORAL_CAPSULE | Freq: Three times a day (TID) | ORAL | Status: DC
  Administered 2018-06-23 – 2018-06-29 (×25): 300 mg via ORAL
  Filled 2018-06-23 (×3): qty 1
  Filled 2018-06-23: qty 28
  Filled 2018-06-23 (×15): qty 1
  Filled 2018-06-23: qty 28
  Filled 2018-06-23 (×7): qty 1
  Filled 2018-06-23: qty 28
  Filled 2018-06-23 (×5): qty 1
  Filled 2018-06-23: qty 28

## 2018-06-23 NOTE — Progress Notes (Signed)
D: Patient denies SI, HI or AVH today. Patient is flat and depressed but pleasant and cooperative.  Pt. States, "I am feeling rough" complaining of withdrawal symptoms, states she is having diffuse body aches and anxiety for which she was medicated.  Pt. States that she did get some sleep but was isolative to her room for most of the morning.  Pt. Was visualized in the dayroom interacting with her peers in the afternoon and stated she was feeling much better.    A: Patient given emotional support from RN. Patient encouraged to come to staff with concerns and/or questions. Patient's medication routine continued. Patient's orders and plan of care reviewed.   R: Patient remains appropriate and cooperative. Will continue to monitor patient q15 minutes for safety.

## 2018-06-23 NOTE — Progress Notes (Signed)
Nursing Progress Note: 7p-7a D: Pt currently presents with a anxious/pleasant affect and behavior. Pt states "I had a much better day today. I feel much better than yesterday." Interacting appropriately with the milieu. Pt reports good sleep during the previous night with current medication regimen.   A: Pt provided with medications per providers orders. Pt's labs and vitals were monitored throughout the night. Pt supported emotionally and encouraged to express concerns and questions. Pt educated on medications.  R: Pt's safety ensured with 15 minute and environmental checks. Pt currently denies SI, HI, and AVH. Pt verbally contracts to seek staff if SI,HI, or AVH occurs and to consult with staff before acting on any harmful thoughts. Will continue to monitor.
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
## 2018-06-23 NOTE — [facility] Group Notes (Signed)
LCSW Group Therapy Note 06/23/2018 10:09 AM  Type of Therapy and Topic: Group Therapy: Avoiding Self-Sabotaging and Enabling Behaviors  Participation Level: Did Not Attend  Description of Group:  In this group, patients will learn how to identify obstacles, self-sabotaging and enabling behaviors, as well as: what are they, why do we do them and what needs these behaviors meet. Discuss unhealthy relationships and how to have positive healthy boundaries with those that sabotage and enable. Explore aspects of self-sabotage and enabling in yourself and how to limit these self-destructive behaviors in everyday life.  Therapeutic Goals: 1. Patient will identify one obstacle that relates to self-sabotage and enabling behaviors 2. Patient will identify one personal self-sabotaging or enabling behavior they did prior to admission 3. Patient will state a plan to change the above identified behavior 4. Patient will demonstrate ability to communicate their needs through discussion and/or role play.   Summary of Patient Progress:  Invited, chose not to attend.     Therapeutic Modalities:  Cognitive Behavioral Therapy Person-Centered Therapy Motivational Interviewing   Zubin Pontillo LCSWA Clinical Social Worker   

## 2018-06-23 NOTE — Progress Notes (Signed)
Adult Psychoeducational Group Note  Date:  06/23/2018 Time:  8:58 PM  Group Topic/Focus:  Wrap-Up Group:   The focus of this group is to help patients review their daily goal of treatment and discuss progress on daily workbooks.  Participation Level:  Active  Participation Quality:  Appropriate  Affect:  Appropriate  Cognitive:  Appropriate  Insight: Appropriate  Engagement in Group:  Engaged  Modes of Intervention:  Discussion  Additional Comments:  Pt stated that she is feeling much better today. Pt also stated that it helps that she has met some new people.   Wynelle Fanny R 06/23/2018, 8:58 PM

## 2018-06-24 LAB — HEMOGLOBIN A1C
HEMOGLOBIN A1C: 5.8 % — AB (ref 4.8–5.6)
Mean Plasma Glucose: 120 mg/dL

## 2018-06-24 NOTE — Plan of Care (Signed)
  Problem: Coping: Goal: Ability to demonstrate self-control will improve Outcome: Progressing   Problem: Coping: Goal: Will verbalize feelings Outcome: Progressing   

## 2018-06-24 NOTE — Progress Notes (Signed)
Pt attended AA group this evening.  
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
## 2018-06-24 NOTE — Progress Notes (Addendum)
Pt attended AA meeting this evening. Pt observed in the dayroom, seen eating a snack. No interaction with peers. Pt appears flat/anxious in affect and mood. Pt denies SI/HI/AVH at this time. Pt c/o of generalized pain (mostly in back and legs). Pt rates pain 8/10. Pt states she has been in the room for much of the day. Pt states she will start going to groups more. Pt states she has a phone interview for [facility] on Monday.PRN robaxin, naproxen, and trazodone requested and given. BP elevated at bedtime. Pt encourage to push fluids. Support and encouragement provided. Will continue with POC.

## 2018-06-24 NOTE — Progress Notes (Signed)
Recreation Therapy Notes  Date: 10.4.19 Time: 0930 Location: 300 [address] Dayroom  Group Topic: Stress Management  Goal Area(s) Addresses:  Patient will verbalize importance of using healthy stress management.  Patient will identify positive emotions associated with healthy stress management.   Behavioral Response: Engaged  Intervention: Stress Management  Activity :  Patients were introduced to ways in which they can relieve stress.   Education:  Stress Management, Discharge Planning.   Education Outcome: Acknowledges edcuation/In group clarification offered/Needs additional education  Clinical Observations/Feedback: Pt attended group.     Caroll Rancher, LRT/CTRS         Caroll Rancher A 06/24/2018 11:43 AM

## 2018-06-24 NOTE — Progress Notes (Signed)
Pt presents with a flat affect and depressed mood. Pt rated on her self inventory sheet: depression 8/10, anxiety 10/10,  Hopelessness 8/10. Pt reported SI sometimes on her self inventory sheet. Pt denied active suicidal thoughts with Clinical research associate and verbally contracted for safety. Pt denied having a suicide plan or intent. Pt reported fair sleep last night. Pt reported withdrawal symptoms of chills, irritability and generalized body aches. Pt administered meds per protocol for relief of symptoms.   Medications reviewed with pt. Medications administered as ordered per MD. Verbal support provided. Pt encouraged to attend groups. 15 minute checks performed for safety.   Pt compliant with tx plan.

## 2018-06-25 MED ORDER — PANTOPRAZOLE SODIUM 20 MG PO TBEC
20.0000 mg | DELAYED_RELEASE_TABLET | Freq: Every day | ORAL | Status: DC
  Administered 2018-06-25 – 2018-06-29 (×5): 20 mg via ORAL
  Filled 2018-06-25: qty 1
  Filled 2018-06-25: qty 7
  Filled 2018-06-25 (×5): qty 1

## 2018-06-25 MED ORDER — LISINOPRIL 20 MG PO TABS
20.0000 mg | ORAL_TABLET | Freq: Every day | ORAL | Status: DC
  Administered 2018-06-25 – 2018-06-29 (×5): 20 mg via ORAL
  Filled 2018-06-25 (×6): qty 1
  Filled 2018-06-25: qty 7

## 2018-06-25 MED ORDER — HYDROCHLOROTHIAZIDE 25 MG PO TABS
25.0000 mg | ORAL_TABLET | Freq: Every day | ORAL | Status: DC
  Administered 2018-06-25 – 2018-06-29 (×5): 25 mg via ORAL
  Filled 2018-06-25 (×8): qty 1

## 2018-06-25 NOTE — Plan of Care (Signed)
D: Patient presents depressed and pained. She slept poorly last night, and received medication that was not helpful. Her appetite is fair, energy low, and concentration poor. She rates her depression and feeling of hopelessness 8/10. Her anxiety is 10/10. She c/o withdrawal symptoms of chillls, cramps, agitation, irritability, watering eyes, and blurred vision. She complains of physical pain 9/10 in legs and back. Patient denies SI/HI/AVH.  A: Patient checked q15 min, and checks reviewed. Reviewed medication changes with patient and educated on side effects. Educated patient on importance of attending group therapy sessions and educated on several coping skills. Encouarged participation in milieu through recreation therapy and attending meals with peers. Support and encouragement provided. Fluids offered. R: Patient receptive to education on medications, and is medication compliant. Patient contracts for safety on the unit. Goal: "stop hurting" and "move around."

## 2018-06-25 NOTE — Progress Notes (Signed)
Patient attended the evening A.A. meeting and was appropriate.  

## 2018-06-25 NOTE — Progress Notes (Addendum)
[facility] MD Progress Note  06/25/2018 11:27 AM Ariana Jensen  MRN:  [id number]   Subjective: Patient reports today that she is having some pretty bad pain in her lower back.  She reports that she had been diagnosed with degenerative disc disease in the past and it feels like it is causing the pain to go down her legs.  She reports that the increased dose of the gabapentin from yesterday did not really help her that much and she is only been taking the Tylenol that has been served prescribed for pain.  She states that naproxen has worked in the past.  She is informed that she would not get any controlled substances on a scheduled basis.  She reports as far as her depression and her anxiety that those of both have improved and the only thing that is causing her to have any agitation is just her pain that is nonstop.  Patient denies any suicidal or homicidal ideations and denies any hallucinations as well.  Patient states that she will be safe on the unit.  Objective: Patient's chart and findings reviewed and discussed with treatment team.  Patient presents lying in her bed and she is pleasant, calm, and cooperative.  She does appear to be in some pain but otherwise she shifts in the bed and the way she talks.  I do not realize that her bed is angled with the head down and the patient stated she did not ask for to be that way so I have requested for staff members to go and raise the head of her bed to assist with her back pain.  Patient does not seem to be med seeking for me at this time and discussed with her the possibilities of using naproxen or ibuprofen.  Patient stated that he the one would be fine.  Principal Problem: Bipolar I disorder, most recent episode depressed, severe without psychotic features (HCC) Diagnosis:   Patient Active Problem List   Diagnosis Date Noted  . PTSD (post-traumatic stress disorder) [F43.10] 06/22/2018  . Bipolar I disorder, most recent episode depressed, severe without  psychotic features (HCC) [F31.4] 06/21/2018  . PVC's (premature ventricular contractions) [I49.3] 03/04/2018  . OD (overdose of drug) [T50.901A] 03/04/2018  . Leukocytosis [D72.829] 03/04/2018  . Hypokalemia [E87.6] 03/04/2018  . Acute drug intoxication with complication (HCC) [F19.929]   . Acute urinary retention [R33.8] 02/11/2018  . Acute renal failure (ARF) (HCC) [N17.9] 02/10/2018  . Closed fracture of left distal fibula [S82.832A] 02/10/2018  . Withdrawal complaint [R68.89] 05/12/2017  . Symptomatic bradycardia [R00.1] 05/12/2017  . Acute kidney injury (HCC) [N17.9] 08/18/2016  . Boils [L02.92] 08/18/2016  . Rhabdomyolysis [M62.82] 08/18/2016  . Urinary retention [R33.9] 08/18/2016  . ARF (acute renal failure) (HCC) [N17.9] 08/18/2016  . AKI (acute kidney injury) (HCC) [N17.9] 08/17/2016  . Hallucination [R44.3] 08/17/2016  . Nausea & vomiting [R11.2] 10/04/2015  . Pruritic dermatitis [L29.9] 09/10/2015  . Rash and nonspecific skin eruption [R21] 08/21/2015  . Body lice [B85.1] 08/05/2015  . Prolonged QT interval [R94.31] 07/18/2015  . Dysmenorrhea [N94.6] 04/10/2015  . Trichimoniasis [A59.9] 04/10/2015  . Chest pain [R07.9] 03/07/2015  . Healthcare maintenance [Z00.00] 03/07/2015  . RLQ abdominal pain [R10.31] 02/18/2012  . GERD (gastroesophageal reflux disease) [K21.9] 01/28/2012  . Menorrhagia [N92.0] 01/25/2012  . Weakness generalized [R53.1] 12/05/2011  . Tobacco abuse [Z72.0] 11/06/2011  . HTN (hypertension) [I10] 10/22/2011  . Abscess and cellulitis [L03.90, L02.91] 10/22/2011  . Hepatitis C, chronic (HCC) [B18.2] 10/22/2011  . History  of TB (tuberculosis) [Z86.11] 10/22/2011  . Opioid use disorder, severe, dependence (HCC) [F11.20]   . Polysubstance abuse (HCC) [F19.10] 04/17/2011   Total Time spent with patient: 30 minutes  Past Psychiatric History: See H&P  Past Medical History:  Past Medical History:  Diagnosis Date  . Anxiety   . ASCUS (atypical  squamous cells of undetermined significance) on Pap smear   . Chronic lower back pain   . Cocaine abuse (HCC)    crack cocaine  . Depression   . Fibromyalgia   . GERD (gastroesophageal reflux disease)   . Hepatitis C   . Hepatitis C antibody test positive   . Hypertension   . Hypokalemia 03/04/2018  . Leukocytosis 03/04/2018  . Opioid dependence (HCC)   . PVC's (premature ventricular contractions) 03/04/2018  . Seizures (HCC)    "don't know why; I've had 2 in the past year" (08/18/2016)  . 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  . Trichomonas 04/17/2011   Diagnosed 04/02/11 during hospitalization, treated with Flagyl 2g, patient instructed to have partner treated (must follow-up)   . Tuberculosis    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 age 44, now s/p 1-yr of multidrug treatment (dates unknown)    Past Surgical History:  Procedure Laterality Date  . CESAREAN SECTION  1984  . DILATION AND CURETTAGE OF UTERUS    . I&D EXTREMITY  10/18/2011   Procedure: IRRIGATION AND DEBRIDEMENT EXTREMITY;  Surgeon: Sharma Covert, MD;  Location: [facility] OR;  Service: Orthopedics;  Laterality: Left;  . I&D EXTREMITY  10/21/2011   Procedure: IRRIGATION AND DEBRIDEMENT EXTREMITY;  Surgeon: Sharma Covert, MD;  Location: [facility] OR;  Service: Orthopedics;  Laterality: Left;  . SALPINGECTOMY Left March 2010   ectopic pregnancy   Family History:  Family History  Problem Relation Age of Onset  . Heart attack Mother   . Heart attack Father   . Heart attack Paternal Grandmother   . Heart attack Paternal Grandfather   . Cirrhosis Brother    Family Psychiatric  History: See H&P Social History:  Social History   Substance and Sexual Activity  Alcohol Use Yes  . Alcohol/week: 0.0 standard drinks   Comment: occ     Social History   Substance and Sexual Activity  Drug Use Yes  . Types: Oxycodone, Heroin, Cocaine, IV    Social History   Socioeconomic History  .  Marital status: Widowed    Spouse name: Not on file  . Number of children: 1  . Years of education: 7th grade  . Highest education level: Not on file  Occupational History  . Not on file  Social Needs  . Financial resource strain: Not on file  . Food insecurity:    Worry: Not on file    Inability: Not on file  . Transportation needs:    Medical: Not on file    Non-medical: Not on file  Tobacco Use  . Smoking status: Current Every Day Smoker    Packs/day: 0.50    Years: 32.00    Pack years: 16.00    Types: Cigarettes  . Smokeless tobacco: Never Used  Substance and Sexual Activity  . Alcohol use: Yes    Alcohol/week: 0.0 standard drinks    Comment: occ  . Drug use: Yes    Types: Oxycodone, Heroin, Cocaine, IV  . Sexual activity: Not Currently    Birth control/protection: None  Lifestyle  . Physical activity:    Days per week: Not on  file    Minutes per session: Not on file  . Stress: Not on file  Relationships  . Social connections:    Talks on phone: Not on file    Gets together: Not on file    Attends religious service: Not on file    Active member of club or organization: Not on file    Attends meetings of clubs or organizations: Not on file    Relationship status: Not on file  Other Topics Concern  . Not on file  Social History Narrative   Lives in [place]   Currently Unemployeed   Additional Social History:                         Sleep: Good  Appetite:  Good  Current Medications: Current Facility-Administered Medications  Medication Dose Route Frequency Provider Last Rate Last Dose  . acetaminophen (TYLENOL) tablet 650 mg  650 mg Oral Q6H PRN Nira Conn A, NP   650 mg at 06/25/18 0836  . alum & mag hydroxide-simeth (MAALOX/MYLANTA) 200-200-20 MG/5ML suspension 30 mL  30 mL Oral Q4H PRN Nira Conn A, NP      . busPIRone (BUSPAR) tablet 15 mg  15 mg Oral BID Armandina Stammer I, NP   15 mg at 06/25/18 0834  . cloNIDine (CATAPRES) tablet 0.1  mg  0.1 mg Oral BH-qamhs Nira Conn A, NP   0.1 mg at 06/25/18 0835   Followed by  . [START ON 06/27/2018] cloNIDine (CATAPRES) tablet 0.1 mg  0.1 mg Oral QAC breakfast Nira Conn A, NP      . divalproex (DEPAKOTE) DR tablet 500 mg  500 mg Oral Q12H Micheal Likens, MD   500 mg at 06/25/18 0834  . feeding supplement (ENSURE ENLIVE) (ENSURE ENLIVE) liquid 237 mL  237 mL Oral BID BM Tye Vigo, Rockey Situ, MD   237 mL at 06/24/18 2027  . gabapentin (NEURONTIN) capsule 300 mg  300 mg Oral TID PC & HS Nwoko, Agnes I, NP   300 mg at 06/25/18 0834  . hydrOXYzine (ATARAX/VISTARIL) tablet 50 mg  50 mg Oral Q6H PRN Micheal Likens, MD   50 mg at 06/22/18 2139  . magnesium hydroxide (MILK OF MAGNESIA) suspension 30 mL  30 mL Oral Daily PRN Nira Conn A, NP   30 mL at 06/25/18 0839  . methocarbamol (ROBAXIN) tablet 500 mg  500 mg Oral Q8H PRN Nira Conn A, NP   500 mg at 06/24/18 2130  . naproxen (NAPROSYN) tablet 500 mg  500 mg Oral BID PRN Nira Conn A, NP   500 mg at 06/24/18 2131  . nicotine (NICODERM CQ - dosed in mg/24 hours) patch 21 mg  21 mg Transdermal Daily [place] Hoback, Rockey Situ, MD   21 mg at 06/25/18 0835  . pantoprazole (PROTONIX) EC tablet 20 mg  20 mg Oral Daily Money, Gerlene Burdock, FNP      . risperiDONE (RISPERDAL) tablet 1 mg  1 mg Oral BID Micheal Likens, MD   1 mg at 06/25/18 0835  . traZODone (DESYREL) tablet 50 mg  50 mg Oral QHS PRN Nira Conn A, NP   50 mg at 06/24/18 2131    Lab Results: No results found for this or any previous visit (from the past 48 hour(s)).  Blood Alcohol level:  Lab Results  Component Value Date   [facility] <10 06/21/2018   ETH <10 03/03/2018    Metabolic Disorder Labs: Lab Results  Component Value Date   HGBA1C 5.8 (H) 06/22/2018   MPG 120 06/22/2018   MPG 116.89 02/11/2018   No results found for: PROLACTIN Lab Results  Component Value Date   CHOL 181 06/22/2018   TRIG 216 (H) 06/22/2018   HDL 55 06/22/2018   CHOLHDL 3.3  06/22/2018   VLDL 43 (H) 06/22/2018   LDLCALC 83 06/22/2018   LDLCALC 73 03/07/2015    Physical Findings: AIMS: Facial and Oral Movements Muscles of Facial Expression: None, normal Lips and Perioral Area: None, normal Jaw: None, normal Tongue: None, normal,Extremity Movements Upper (arms, wrists, hands, fingers): None, normal Lower (legs, knees, ankles, toes): None, normal, Trunk Movements Neck, shoulders, hips: None, normal, Overall Severity Severity of abnormal movements (highest score from questions above): None, normal Incapacitation due to abnormal movements: None, normal Patient's awareness of abnormal movements (rate only patient's report): No Awareness, Dental Status Current problems with teeth and/or dentures?: No Does patient usually wear dentures?: No  CIWA:  CIWA-Ar Total: 4 COWS:  COWS Total Score: 4  Musculoskeletal: Strength & Muscle Tone: within normal limits Gait & Station: normal Patient leans: N/A  Psychiatric Specialty Exam: Physical Exam  Nursing note and vitals reviewed. Constitutional: She is oriented to person, place, and time. She appears well-developed and well-nourished.  Cardiovascular: Normal rate.  Respiratory: Effort normal.  Musculoskeletal: Normal range of motion.  Neurological: She is alert and oriented to person, place, and time.  Skin: Skin is warm.    Review of Systems  Constitutional: Negative.   HENT: Negative.   Eyes: Negative.   Respiratory: Negative.   Cardiovascular: Negative.   Gastrointestinal: Negative.   Genitourinary: Negative.   Musculoskeletal: Positive for back pain.  Skin: Negative.   Neurological: Negative.   Endo/Heme/Allergies: Negative.   Psychiatric/Behavioral: Positive for depression. Negative for suicidal ideas.    Blood pressure (!) 155/121, pulse 71, temperature 97.9 F (36.6 C), resp. rate 18, height 5\' 3"  (1.6 m), weight 49 kg, last menstrual period 12/06/2015.Body mass index is 19.13 kg/m.  General  Appearance: Disheveled  Eye Contact:  Good  Speech:  Clear and Coherent and Normal Rate  Volume:  Normal  Mood:  Depressed  Affect:  Flat  Thought Process:  Goal Directed and Descriptions of Associations: Intact  Orientation:  Full (Time, Place, and Person)  Thought Content:  WDL  Suicidal Thoughts:  No  Homicidal Thoughts:  No  Memory:  Immediate;   Good Recent;   Good Remote;   Good  Judgement:  Fair  Insight:  Fair  Psychomotor Activity:  Normal  Concentration:  Concentration: Good  Recall:  Good  Fund of Knowledge:  Good  Language:  Good  Akathisia:  No  Handed:  Right  AIMS (if indicated):     Assets:  Communication Skills Desire for Improvement Financial Resources/Insurance Social Support  ADL's:  Intact  Cognition:  WNL  Sleep:  Number of Hours: 6   Problems addressed Bipolar 1 disorder most recent episode depressed PTSD Opiate use disorder  Treatment Plan Summary: Daily contact with patient to assess and evaluate symptoms and progress in treatment, Medication management and Plan is to: Continue BuSpar 15 mg p.o. twice daily for mood stability Continue clonidine detox protocol Continue Depakote 500 mg p.o. every 12 hours for mood stability Continue gabapentin 300 mg p.o. 4 times a day for mood stability and pain Continue Vistaril 50 mg p.o. every 6 hours as needed for anxiety Continue Risperdal 1 mg p.o. twice daily for mood stability Continue  trazodone 50 mg p.o. nightly as needed for insomnia Start Protonix 20 mg p.o. daily for GI protection from naproxen Encourage patient to take naproxen twice a day during her stay to hospital to relieve some back pain Encourage group therapy participation  Maryfrances Bunnell, FNP 06/25/2018, 11:27 AM   ..Agree with NP Progress Note

## 2018-06-25 NOTE — [facility] Group Notes (Signed)
[facility] Group Notes:  (Nursing/MHT/Case Management/Adjunct)  Date:  06/25/2018  Time:  7:24 PM  Type of Therapy:  Nurse Education  Participation Level:  Minimal  Participation Quality:  Appropriate and Attentive  Affect:  Appropriate  Cognitive:  Alert and Appropriate  Insight:  Good and Improving  Engagement in Group:  Engaged  Modes of Intervention:  Discussion and Education  Summary of Progress/Problems: In this group we discussed anger management strategies and health communication.   Kirstie Mirza 06/25/2018, 7:24 PM

## 2018-06-25 NOTE — [facility] Group Notes (Signed)
LCSW Group Therapy Note  06/25/2018   10:00-11:00am   Type of Therapy and Topic:  Group Therapy: Anger Cues and Responses  Participation Level:  Did Not Attend   Description of Group:   In this group, patients learned how to recognize the physical, cognitive, emotional, and behavioral responses they have to anger-provoking situations.  They identified a recent time they became angry and how they reacted.  They analyzed how their reaction was possibly beneficial and how it was possibly unhelpful.  The group discussed a variety of healthier coping skills that could help with such a situation in the future.  Deep breathing was practiced briefly.  Therapeutic Goals: 1. Patients will remember their last incident of anger and how they felt emotionally and physically, what their thoughts were at the time, and how they behaved. 2. Patients will identify how their behavior at that time worked for them, as well as how it worked against them. 3. Patients will explore possible new behaviors to use in future anger situations. 4. Patients will learn that anger itself is normal and cannot be eliminated, and that healthier reactions can assist with resolving conflict rather than worsening situations.  Summary of Patient Progress:  N/A  Therapeutic Modalities:   Cognitive Behavioral Therapy  Jacole Capley J Grossman-Orr     

## 2018-06-26 MED ORDER — NAPROXEN 500 MG PO TABS
500.0000 mg | ORAL_TABLET | Freq: Two times a day (BID) | ORAL | Status: DC | PRN
  Administered 2018-06-26 – 2018-06-29 (×5): 500 mg via ORAL
  Filled 2018-06-26 (×5): qty 1

## 2018-06-26 MED ORDER — CLONIDINE HCL 0.1 MG PO TABS
0.1000 mg | ORAL_TABLET | Freq: Once | ORAL | Status: AC
  Administered 2018-06-26: 0.1 mg via ORAL
  Filled 2018-06-26 (×2): qty 1

## 2018-06-26 MED ORDER — CLONIDINE HCL 0.1 MG PO TABS
0.1000 mg | ORAL_TABLET | Freq: Two times a day (BID) | ORAL | Status: DC
  Administered 2018-06-26 – 2018-06-29 (×6): 0.1 mg via ORAL
  Filled 2018-06-26: qty 1
  Filled 2018-06-26 (×2): qty 14
  Filled 2018-06-26 (×8): qty 1

## 2018-06-26 NOTE — [facility] Group Notes (Signed)
[facility] Group Notes:  (Nursing/MHT/Case Management/Adjunct)  Date:  06/26/2018  Time:  2:54 PM  Type of Therapy:  Nurse Education  Participation Level:  Minimal  Participation Quality:  Appropriate and Attentive  Affect:  Anxious and Appropriate  Cognitive:  Appropriate  Insight:  Good  Engagement in Group:  Engaged  Modes of Intervention:  Discussion and Education  Summary of Progress/Problems: In this group, we discussed stress management and coping skills.  Kirstie Mirza 06/26/2018, 2:54 PM

## 2018-06-26 NOTE — Plan of Care (Signed)
D: Patient presents calm, pleasant. She slept fair last night, and received medication that was helpful. Her appetite is fair, energy low and concentration poor. She rates her depression and anxiety 8/10 and hopelessness 7/10. She complains of agitation, irritability, and low back pain 7/10. She feels the medication is only helping "a little bit" for pain. Patient denies SI/HI/AVH.  A: Patient checked q15 min, and checks reviewed. Reviewed medication changes with patient and educated on side effects. Educated patient on importance of attending group therapy sessions and educated on several coping skills. Encouarged participation in milieu through recreation therapy and attending meals with peers. Support and encouragement provided. Fluids offered. R: Patient receptive to education on medications, and is medication compliant. Patient contracts for safety on the unit. Goal: "To feel better" and "Just keep moving."

## 2018-06-26 NOTE — [facility] Group Notes (Signed)
[facility] LCSW Group Therapy Note  06/26/2018  10:00-11:00AM  Type of Therapy and Topic:  Group Therapy:  Adding Supports Including Being Your Own Support  Participation Level:  Did Not Attend   Description of Group:  Patients in this group were introduced to the concept that additional supports including self-support are an essential part of recovery.  A song entitled "I Need Help!" was played and a group discussion was held in reaction to the idea of needing to add supports.  A song entitled "My Own Hero" was played and a group discussion ensued in which patients stated they could relate to the song and it inspired them to realize they have be willing to help themselves in order to succeed, because other people cannot achieve sobriety or stability for them.  We discussed adding a variety of healthy supports to address the various needs in their lives.  A song was played called "I Know Where I've Been" toward the end of group and used to conduct an inspirational wrap-up to group of remembering how far they have already come in their journey.  Therapeutic Goals: 1)  demonstrate the importance of being a part of one's own support system 2)  discuss reasons people in one's life may eventually be unable to be continually supportive  3)  identify the patient's current support system and   4)  elicit commitments to add healthy supports and to become more conscious of being self-supportive   Summary of Patient Progress:  N/A   Therapeutic Modalities:   Motivational Interviewing Activity  Kili Gracy J Grossman-Orr       

## 2018-06-26 NOTE — Progress Notes (Addendum)
[facility] MD Progress Note  06/26/2018 2:01 PM Ariana Jensen  MRN:  [id number]   Subjective: Patient states that she is feeling better today.  She reports that she has some mild anxiety and some mild depression.  But she denies any suicidal or homicidal ideations and denies any hallucinations.  She reports that her body overall does feel better with the medications that she is started using and with get her blood pressure a little bit better under control.  She feels that the medications she has been placed on especially the Depakote has really helped her to calm down and feel better and she wishes to continue the same medications that she is on at this time.  She is, hoping to be discharged in the next couple of days but she is also looking for residential treatment that social work is working on.  Objective: Patient's chart and findings reviewed and discussed with treatment team.  Patient presents in her room but she is awake just lying there.  She has been seen in the day room interacting with peers and staff and has been appropriate on the unit.  Reviewed patient's labs her TSH was elevated on 06/22/2018, so will recheck TSH and draw T3 and T4.  We will also have a valproic acid level scheduled for tomorrow morning.  Principal Problem: Bipolar I disorder, most recent episode depressed, severe without psychotic features (HCC) Diagnosis:   Patient Active Problem List   Diagnosis Date Noted  . PTSD (post-traumatic stress disorder) [F43.10] 06/22/2018  . Bipolar I disorder, most recent episode depressed, severe without psychotic features (HCC) [F31.4] 06/21/2018  . PVC's (premature ventricular contractions) [I49.3] 03/04/2018  . OD (overdose of drug) [T50.901A] 03/04/2018  . Leukocytosis [D72.829] 03/04/2018  . Hypokalemia [E87.6] 03/04/2018  . Acute drug intoxication with complication (HCC) [F19.929]   . Acute urinary retention [R33.8] 02/11/2018  . Acute renal failure (ARF) (HCC) [N17.9]  02/10/2018  . Closed fracture of left distal fibula [S82.832A] 02/10/2018  . Withdrawal complaint [R68.89] 05/12/2017  . Symptomatic bradycardia [R00.1] 05/12/2017  . Acute kidney injury (HCC) [N17.9] 08/18/2016  . Boils [L02.92] 08/18/2016  . Rhabdomyolysis [M62.82] 08/18/2016  . Urinary retention [R33.9] 08/18/2016  . ARF (acute renal failure) (HCC) [N17.9] 08/18/2016  . AKI (acute kidney injury) (HCC) [N17.9] 08/17/2016  . Hallucination [R44.3] 08/17/2016  . Nausea & vomiting [R11.2] 10/04/2015  . Pruritic dermatitis [L29.9] 09/10/2015  . Rash and nonspecific skin eruption [R21] 08/21/2015  . Body lice [B85.1] 08/05/2015  . Prolonged QT interval [R94.31] 07/18/2015  . Dysmenorrhea [N94.6] 04/10/2015  . Trichimoniasis [A59.9] 04/10/2015  . Chest pain [R07.9] 03/07/2015  . Healthcare maintenance [Z00.00] 03/07/2015  . RLQ abdominal pain [R10.31] 02/18/2012  . GERD (gastroesophageal reflux disease) [K21.9] 01/28/2012  . Menorrhagia [N92.0] 01/25/2012  . Weakness generalized [R53.1] 12/05/2011  . Tobacco abuse [Z72.0] 11/06/2011  . HTN (hypertension) [I10] 10/22/2011  . Abscess and cellulitis [L03.90, L02.91] 10/22/2011  . Hepatitis C, chronic (HCC) [B18.2] 10/22/2011  . History of TB (tuberculosis) [Z86.11] 10/22/2011  . Opioid use disorder, severe, dependence (HCC) [F11.20]   . Polysubstance abuse (HCC) [F19.10] 04/17/2011   Total Time spent with patient: 30 minutes  Past Psychiatric History: See H&P  Past Medical History:  Past Medical History:  Diagnosis Date  . Anxiety   . ASCUS (atypical squamous cells of undetermined significance) on Pap smear   . Chronic lower back pain   . Cocaine abuse (HCC)    crack cocaine  . Depression   .  Fibromyalgia   . GERD (gastroesophageal reflux disease)   . Hepatitis C   . Hepatitis C antibody test positive   . Hypertension   . Hypokalemia 03/04/2018  . Leukocytosis 03/04/2018  . Opioid dependence (HCC)   . PVC's (premature  ventricular contractions) 03/04/2018  . Seizures (HCC)    "don't know why; I've had 2 in the past year" (08/18/2016)  . 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  . Trichomonas 04/17/2011   Diagnosed 04/02/11 during hospitalization, treated with Flagyl 2g, patient instructed to have partner treated (must follow-up)   . Tuberculosis    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 age 62, now s/p 1-yr of multidrug treatment (dates unknown)    Past Surgical History:  Procedure Laterality Date  . CESAREAN SECTION  1984  . DILATION AND CURETTAGE OF UTERUS    . I&D EXTREMITY  10/18/2011   Procedure: IRRIGATION AND DEBRIDEMENT EXTREMITY;  Surgeon: Sharma Covert, MD;  Location: [facility] OR;  Service: Orthopedics;  Laterality: Left;  . I&D EXTREMITY  10/21/2011   Procedure: IRRIGATION AND DEBRIDEMENT EXTREMITY;  Surgeon: Sharma Covert, MD;  Location: [facility] OR;  Service: Orthopedics;  Laterality: Left;  . SALPINGECTOMY Left March 2010   ectopic pregnancy   Family History:  Family History  Problem Relation Age of Onset  . Heart attack Mother   . Heart attack Father   . Heart attack Paternal Grandmother   . Heart attack Paternal Grandfather   . Cirrhosis Brother    Family Psychiatric  History: See H&P Social History:  Social History   Substance and Sexual Activity  Alcohol Use Yes  . Alcohol/week: 0.0 standard drinks   Comment: occ     Social History   Substance and Sexual Activity  Drug Use Yes  . Types: Oxycodone, Heroin, Cocaine, IV    Social History   Socioeconomic History  . Marital status: Widowed    Spouse name: Not on file  . Number of children: 1  . Years of education: 7th grade  . Highest education level: Not on file  Occupational History  . Not on file  Social Needs  . Financial resource strain: Not on file  . Food insecurity:    Worry: Not on file    Inability: Not on file  . Transportation needs:    Medical: Not on file    Non-medical:  Not on file  Tobacco Use  . Smoking status: Current Every Day Smoker    Packs/day: 0.50    Years: 32.00    Pack years: 16.00    Types: Cigarettes  . Smokeless tobacco: Never Used  Substance and Sexual Activity  . Alcohol use: Yes    Alcohol/week: 0.0 standard drinks    Comment: occ  . Drug use: Yes    Types: Oxycodone, Heroin, Cocaine, IV  . Sexual activity: Not Currently    Birth control/protection: None  Lifestyle  . Physical activity:    Days per week: Not on file    Minutes per session: Not on file  . Stress: Not on file  Relationships  . Social connections:    Talks on phone: Not on file    Gets together: Not on file    Attends religious service: Not on file    Active member of club or organization: Not on file    Attends meetings of clubs or organizations: Not on file    Relationship status: Not on file  Other Topics Concern  . Not on  file  Social History Narrative   Lives in [place]   Currently Unemployeed   Additional Social History:                         Sleep: Good  Appetite:  Good  Current Medications: Current Facility-Administered Medications  Medication Dose Route Frequency Provider Last Rate Last Dose  . acetaminophen (TYLENOL) tablet 650 mg  650 mg Oral Q6H PRN Nira Conn A, NP   650 mg at 06/25/18 1949  . alum & mag hydroxide-simeth (MAALOX/MYLANTA) 200-200-20 MG/5ML suspension 30 mL  30 mL Oral Q4H PRN Nira Conn A, NP      . busPIRone (BUSPAR) tablet 15 mg  15 mg Oral BID Armandina Stammer I, NP   15 mg at 06/26/18 0754  . [START ON 06/27/2018] cloNIDine (CATAPRES) tablet 0.1 mg  0.1 mg Oral QAC breakfast Nira Conn A, NP      . divalproex (DEPAKOTE) DR tablet 500 mg  500 mg Oral Q12H Micheal Likens, MD   500 mg at 06/26/18 0754  . feeding supplement (ENSURE ENLIVE) (ENSURE ENLIVE) liquid 237 mL  237 mL Oral BID BM Cobos, Rockey Situ, MD   237 mL at 06/26/18 1312  . gabapentin (NEURONTIN) capsule 300 mg  300 mg Oral TID PC &  HS Nwoko, Agnes I, NP   300 mg at 06/26/18 1310  . hydrochlorothiazide (HYDRODIURIL) tablet 25 mg  25 mg Oral Daily Money, Gerlene Burdock, FNP   25 mg at 06/26/18 0753  . hydrOXYzine (ATARAX/VISTARIL) tablet 50 mg  50 mg Oral Q6H PRN Micheal Likens, MD   50 mg at 06/25/18 2126  . lisinopril (PRINIVIL,ZESTRIL) tablet 20 mg  20 mg Oral Daily Money, Gerlene Burdock, FNP   20 mg at 06/26/18 0753  . magnesium hydroxide (MILK OF MAGNESIA) suspension 30 mL  30 mL Oral Daily PRN Nira Conn A, NP   30 mL at 06/25/18 0839  . methocarbamol (ROBAXIN) tablet 500 mg  500 mg Oral Q8H PRN Nira Conn A, NP   500 mg at 06/26/18 1218  . naproxen (NAPROSYN) tablet 500 mg  500 mg Oral BID PRN Money, Gerlene Burdock, FNP   500 mg at 06/26/18 1219  . nicotine (NICODERM CQ - dosed in mg/24 hours) patch 21 mg  21 mg Transdermal Daily Cobos, Rockey Situ, MD   21 mg at 06/26/18 0753  . pantoprazole (PROTONIX) EC tablet 20 mg  20 mg Oral Daily Money, Gerlene Burdock, FNP   20 mg at 06/26/18 0754  . risperiDONE (RISPERDAL) tablet 1 mg  1 mg Oral BID Micheal Likens, MD   1 mg at 06/26/18 0754  . traZODone (DESYREL) tablet 50 mg  50 mg Oral QHS PRN Jackelyn Poling, NP   50 mg at 06/25/18 2126    Lab Results: No results found for this or any previous visit (from the past 48 hour(s)).  Blood Alcohol level:  Lab Results  Component Value Date   ETH <10 06/21/2018   ETH <10 03/03/2018    Metabolic Disorder Labs: Lab Results  Component Value Date   HGBA1C 5.8 (H) 06/22/2018   MPG 120 06/22/2018   MPG 116.89 02/11/2018   No results found for: PROLACTIN Lab Results  Component Value Date   CHOL 181 06/22/2018   TRIG 216 (H) 06/22/2018   HDL 55 06/22/2018   CHOLHDL 3.3 06/22/2018   VLDL 43 (H) 06/22/2018   LDLCALC 83 06/22/2018  LDLCALC 73 03/07/2015    Physical Findings: AIMS: Facial and Oral Movements Muscles of Facial Expression: None, normal Lips and Perioral Area: None, normal Jaw: None, normal Tongue: None,  normal,Extremity Movements Upper (arms, wrists, hands, fingers): None, normal Lower (legs, knees, ankles, toes): None, normal, Trunk Movements Neck, shoulders, hips: None, normal, Overall Severity Severity of abnormal movements (highest score from questions above): None, normal Incapacitation due to abnormal movements: None, normal Patient's awareness of abnormal movements (rate only patient's report): No Awareness, Dental Status Current problems with teeth and/or dentures?: No Does patient usually wear dentures?: No  CIWA:  CIWA-Ar Total: 1 COWS:  COWS Total Score: 1  Musculoskeletal: Strength & Muscle Tone: within normal limits Gait & Station: normal Patient leans: N/A  Psychiatric Specialty Exam: Physical Exam  Nursing note and vitals reviewed. Constitutional: She is oriented to person, place, and time. She appears well-developed and well-nourished.  Cardiovascular: Normal rate.  Respiratory: Effort normal.  Musculoskeletal: Normal range of motion.  Neurological: She is alert and oriented to person, place, and time.  Skin: Skin is warm.    Review of Systems  Constitutional: Negative.   HENT: Negative.   Eyes: Negative.   Respiratory: Negative.   Cardiovascular: Negative.   Gastrointestinal: Negative.   Genitourinary: Negative.   Musculoskeletal: Positive for back pain.  Skin: Negative.   Neurological: Negative.   Endo/Heme/Allergies: Negative.   Psychiatric/Behavioral: Positive for depression. Negative for suicidal ideas.    Blood pressure (!) 179/122, pulse 72, temperature 98.3 F (36.8 C), temperature source Oral, resp. rate 18, height 5\' 3"  (1.6 m), weight 49 kg, last menstrual period 12/06/2015.Body mass index is 19.13 kg/m.  General Appearance: Disheveled  Eye Contact:  Good  Speech:  Clear and Coherent and Normal Rate  Volume:  Normal  Mood:  Depressed  Affect:  Flat  Thought Process:  Goal Directed and Descriptions of Associations: Intact  Orientation:   Full (Time, Place, and Person)  Thought Content:  WDL  Suicidal Thoughts:  No  Homicidal Thoughts:  No  Memory:  Immediate;   Good Recent;   Good Remote;   Good  Judgement:  Fair  Insight:  Fair  Psychomotor Activity:  Normal  Concentration:  Concentration: Good  Recall:  Good  Fund of Knowledge:  Good  Language:  Good  Akathisia:  No  Handed:  Right  AIMS (if indicated):     Assets:  Communication Skills Desire for Improvement Financial Resources/Insurance Social Support  ADL's:  Intact  Cognition:  WNL  Sleep:  Number of Hours: 6   Problems addressed Bipolar 1 disorder most recent episode depressed PTSD Opiate use disorder  Treatment Plan Summary: Daily contact with patient to assess and evaluate symptoms and progress in treatment, Medication management and Plan is to: Continue BuSpar 15 mg p.o. twice daily for mood stability Discontinue clonidine detox protocol Ordered labs TSH, free T3, free T4, and valproic acid level for tomorrow morning We will start patient on clonidine 0.1 mg p.o. twice daily for hypertension.  Patient was taking 0.2 mg daily and with patient's continued elevated blood pressures we will split the dose up to twice a day so that we can manage blood pressures better Continue Depakote 500 mg p.o. every 12 hours for mood stability Continue gabapentin 300 mg p.o. 4 times a day for mood stability and pain Continue Vistaril 50 mg p.o. every 6 hours as needed for anxiety Continue Risperdal 1 mg p.o. twice daily for mood stability Continue trazodone 50 mg p.o.  nightly as needed for insomnia Start Protonix 20 mg p.o. daily for GI protection from naproxen Encourage patient to take naproxen twice a day during her stay to hospital to relieve some back pain Encourage group therapy participation  Maryfrances Bunnell, FNP 06/26/2018, 2:01 PM   ..Agree with NP Progress Note

## 2018-06-26 NOTE — Progress Notes (Signed)
D: Pt was in dayroom upon initial approach.  Pt presents with anxious affect and mood.  When asked how her day has been, pt states "I'm hanging in there."  Her goal is to "be able to sleep all night."  Pt denies SI/HI, denies hallucinations, reports back pain of 8/10.  Pt has been visible in milieu interacting with peers and staff appropriately.  Pt attended evening group.    A: Introduced self to pt.  Actively listened to pt and offered support and encouragement. Medications administered per order.  PRN medication administered for anxiety, pain, muscle spasms, and sleep.  Fall prevention techniques reviewed with pt and she verbalized understanding.  Q15 minute safety checks maintained.  R: Pt is safe on the unit.  Pt is compliant with medications.  Pt verbally contracts for safety.  Will continue to monitor and assess.

## 2018-06-27 LAB — T4, FREE: Free T4: 0.8 ng/dL — ABNORMAL LOW (ref 0.82–1.77)

## 2018-06-27 LAB — VALPROIC ACID LEVEL: Valproic Acid Lvl: 54 ug/mL (ref 50.0–100.0)

## 2018-06-27 LAB — TSH: TSH: 1.3 u[IU]/mL (ref 0.350–4.500)

## 2018-06-27 NOTE — Progress Notes (Signed)
D: Pt was in dayroom upon initial approach.  Pt presents with anxious affect and mood.  She reports her day was "better" and her goal is to "continue to feel better."  Pt denies SI/HI, denies hallucinations, reports back pain of 7/10.  Pt has been visible in milieu interacting with peers and staff appropriately.  Pt attended evening group.    A: Introduced self to pt.  Actively listened to pt and offered support and encouragement. Medications administered per order.  PRN medication administered for anxiety, sleep, pain.  Heat packs provided for pain.  Q15 minute safety checks maintained.  R: Pt is safe on the unit.  Pt is compliant with medications.  Pt verbally contracts for safety.  Will continue to monitor and assess.

## 2018-06-27 NOTE — Progress Notes (Signed)
D: Pt was in dayroom upon initial approach.  Pt presents with anxious affect and mood.  She smiles with interaction.  She reports she has felt "much better" today and her goal is to "continue to feel better."  Pt denies SI/HI, denies hallucinations, reports back pain of 6/10.  Pt has been visible in milieu interacting with peers and staff appropriately.  Pt attended evening group.    A: Introduced self to pt.  Actively listened to pt and offered support and encouragement. Medications administered per order.  PRN medication administered for sleep, anxiety, and pain.  Heat packs provided for pain.  Q15 minute safety checks maintained.  R: Pt is safe on the unit.  Pt is compliant with medications.  Pt verbally contracts for safety.  Will continue to monitor and assess.

## 2018-06-27 NOTE — [facility] Group Notes (Signed)
LCSW Group Therapy Note   06/27/2018 1:15pm   Type of Therapy and Topic:  Group Therapy:  Overcoming Obstacles   Participation Level:  Did Not Attend--pt invited. Chose to remain in bed.    Description of Group:    In this group patients will be encouraged to explore what they see as obstacles to their own wellness and recovery. They will be guided to discuss their thoughts, feelings, and behaviors related to these obstacles. The group will process together ways to cope with barriers, with attention given to specific choices patients can make. Each patient will be challenged to identify changes they are motivated to make in order to overcome their obstacles. This group will be process-oriented, with patients participating in exploration of their own experiences as well as giving and receiving support and challenge from other group members.   Therapeutic Goals: 1. Patient will identify personal and current obstacles as they relate to admission. 2. Patient will identify barriers that currently interfere with their wellness or overcoming obstacles.  3. Patient will identify feelings, thought process and behaviors related to these barriers. 4. Patient will identify two changes they are willing to make to overcome these obstacles:      Summary of Patient Progress   x   Therapeutic Modalities:   Cognitive Behavioral Therapy Solution Focused Therapy Motivational Interviewing Relapse Prevention Therapy  Rona Ravens, LCSW 06/27/2018 1:06 PM

## 2018-06-27 NOTE — [facility] Suicide Risk Assessment (Signed)
[facility] INPATIENT:  Family/Significant Other Suicide Prevention Education  Suicide Prevention Education:  Patient Refusal for Family/Significant Other Suicide Prevention Education: The patient Ariana Jensen has refused to provide written consent for family/significant other to be provided Family/Significant Other Suicide Prevention Education during admission and/or prior to discharge.  Physician notified.  SPE completed with pt, as pt refused to consent to family contact. SPI pamphlet provided to pt and pt was encouraged to share information with support network, ask questions, and talk about any concerns relating to SPE. Pt denies access to guns/firearms and verbalized understanding of information provided. Mobile Crisis information also provided to pt.   Rona Ravens LCSW 06/27/2018, 9:13 AM

## 2018-06-27 NOTE — Tx Team (Signed)
Interdisciplinary Treatment and Diagnostic Plan Update  06/27/2018 Time of Session:  Ariana Jensen MRN: [id number]  Principal Diagnosis: Bipolar I disorder, most recent episode depressed, severe without psychotic features (HCC)  Secondary Diagnoses: Principal Problem:   Bipolar I disorder, most recent episode depressed, severe without psychotic features (HCC) Active Problems:   Opioid use disorder, severe, dependence (HCC)   PTSD (post-traumatic stress disorder)   Current Medications:  Current Facility-Administered Medications  Medication Dose Route Frequency Provider Last Rate Last Dose  . acetaminophen (TYLENOL) tablet 650 mg  650 mg Oral Q6H PRN Nira Conn A, NP   650 mg at 06/26/18 1709  . alum & mag hydroxide-simeth (MAALOX/MYLANTA) 200-200-20 MG/5ML suspension 30 mL  30 mL Oral Q4H PRN Nira Conn A, NP      . busPIRone (BUSPAR) tablet 15 mg  15 mg Oral BID Armandina Stammer I, NP   15 mg at 06/27/18 4098  . cloNIDine (CATAPRES) tablet 0.1 mg  0.1 mg Oral BID Money, Feliz Beam B, FNP   0.1 mg at 06/27/18 0831  . divalproex (DEPAKOTE) DR tablet 500 mg  500 mg Oral Q12H Micheal Likens, MD   500 mg at 06/27/18 1191  . feeding supplement (ENSURE ENLIVE) (ENSURE ENLIVE) liquid 237 mL  237 mL Oral BID BM Cobos, Rockey Situ, MD   237 mL at 06/26/18 1312  . gabapentin (NEURONTIN) capsule 300 mg  300 mg Oral TID PC & HS Nwoko, Agnes I, NP   300 mg at 06/27/18 0834  . hydrochlorothiazide (HYDRODIURIL) tablet 25 mg  25 mg Oral Daily Money, Gerlene Burdock, FNP   25 mg at 06/27/18 4782  . hydrOXYzine (ATARAX/VISTARIL) tablet 50 mg  50 mg Oral Q6H PRN Micheal Likens, MD   50 mg at 06/26/18 2141  . lisinopril (PRINIVIL,ZESTRIL) tablet 20 mg  20 mg Oral Daily Money, Gerlene Burdock, FNP   20 mg at 06/27/18 0831  . magnesium hydroxide (MILK OF MAGNESIA) suspension 30 mL  30 mL Oral Daily PRN Nira Conn A, NP   30 mL at 06/25/18 0839  . naproxen (NAPROSYN) tablet 500 mg  500 mg Oral BID PRN  Money, Gerlene Burdock, FNP   500 mg at 06/26/18 2142  . nicotine (NICODERM CQ - dosed in mg/24 hours) patch 21 mg  21 mg Transdermal Daily Cobos, Rockey Situ, MD   21 mg at 06/26/18 0753  . pantoprazole (PROTONIX) EC tablet 20 mg  20 mg Oral Daily Money, Gerlene Burdock, FNP   20 mg at 06/27/18 0831  . risperiDONE (RISPERDAL) tablet 1 mg  1 mg Oral BID Micheal Likens, MD   1 mg at 06/27/18 9562  . traZODone (DESYREL) tablet 50 mg  50 mg Oral QHS PRN Nira Conn A, NP   50 mg at 06/26/18 2142   PTA Medications: Medications Prior to Admission  Medication Sig Dispense Refill Last Dose  . amLODipine (NORVASC) 10 MG tablet Take 1 tablet (10 mg total) by mouth daily. 90 tablet 3 Past Month at Unknown time  . busPIRone (BUSPAR) 15 MG tablet Take 15 mg by mouth 2 (two) times daily.   Past Month at Unknown time  . cloNIDine (CATAPRES) 0.2 MG tablet Take 0.2 mg by mouth daily.    Past Month at Unknown time  . divalproex (DEPAKOTE) 250 MG DR tablet Take 250 mg by mouth 2 (two) times daily.   Past Month at Unknown time  . gabapentin (NEURONTIN) 300 MG capsule Take 300 mg by mouth 3 (  three) times daily.   Past Month at Unknown time  . hydrochlorothiazide (HYDRODIURIL) 25 MG tablet Take 1 tablet (25 mg total) by mouth daily. 90 tablet 0 Past Month at Unknown time  . hydrOXYzine (ATARAX/VISTARIL) 50 MG tablet Take 50 mg by mouth 2 (two) times daily as needed for anxiety.   Past Month at Unknown time  . lamoTRIgine (LAMICTAL) 25 MG tablet Take 25 mg by mouth 2 (two) times daily.   Past Month at Unknown time  . lisinopril (PRINIVIL,ZESTRIL) 40 MG tablet Take 40 mg by mouth daily.   Past Month at Unknown time  . risperiDONE (RISPERDAL) 1 MG tablet Take 1 mg by mouth 2 (two) times daily.   Past Month at Unknown time  . triamcinolone cream (KENALOG) 0.1 % Apply 1 application topically 2 (two) times daily. (Patient not taking: Reported on 06/21/2018) 30 g 0 Not Taking at Unknown time    Patient Stressors: Financial  difficulties Health problems Marital or family conflict Substance abuse  Patient Strengths: Ability for insight Average or above average intelligence Capable of independent living [employer] of knowledge Motivation for treatment/growth  Treatment Modalities: Medication Management, Group therapy, Case management,  1 to 1 session with clinician, Psychoeducation, Recreational therapy.   Physician Treatment Plan for Primary Diagnosis: Bipolar I disorder, most recent episode depressed, severe without psychotic features (HCC) Long Term Goal(s): Improvement in symptoms so as ready for discharge Improvement in symptoms so as ready for discharge   Short Term Goals: Ability to identify and develop effective coping behaviors will improve Ability to identify triggers associated with substance abuse/mental health issues will improve  Medication Management: Evaluate patient's response, side effects, and tolerance of medication regimen.  Therapeutic Interventions: 1 to 1 sessions, Unit Group sessions and Medication administration.  Evaluation of Outcomes: Progressing   Physician Treatment Plan for Secondary Diagnosis: Principal Problem:   Bipolar I disorder, most recent episode depressed, severe without psychotic features (HCC) Active Problems:   Opioid use disorder, severe, dependence (HCC)   PTSD (post-traumatic stress disorder)  Long Term Goal(s): Improvement in symptoms so as ready for discharge Improvement in symptoms so as ready for discharge   Short Term Goals: Ability to identify and develop effective coping behaviors will improve Ability to identify triggers associated with substance abuse/mental health issues will improve     Medication Management: Evaluate patient's response, side effects, and tolerance of medication regimen.  Therapeutic Interventions: 1 to 1 sessions, Unit Group sessions and Medication administration.  Evaluation of Outcomes: Progressing  RN Treatment  Plan for Primary Diagnosis: Bipolar I disorder, most recent episode depressed, severe without psychotic features (HCC) Long Term Goal(s): Knowledge of disease and therapeutic regimen to maintain health will improve  Short Term Goals: Ability to participate in decision making will improve, Ability to disclose and discuss suicidal ideas, Ability to identify and develop effective coping behaviors will improve and Compliance with prescribed medications will improve  Medication Management: RN will administer medications as ordered by provider, will assess and evaluate patient's response and provide education to patient for prescribed medication. RN will report any adverse and/or side effects to prescribing provider.  Therapeutic Interventions: 1 on 1 counseling sessions, Psychoeducation, Medication administration, Evaluate responses to treatment, Monitor vital signs and CBGs as ordered, Perform/monitor CIWA, COWS, AIMS and Fall Risk screenings as ordered, Perform wound care treatments as ordered.  Evaluation of Outcomes: Progressing  LCSW Treatment Plan for Primary Diagnosis: Bipolar I disorder, most recent episode depressed, severe without psychotic features (HCC) Long  Term Goal(s): Safe transition to appropriate next level of care at discharge, Engage patient in therapeutic group addressing interpersonal concerns.  Short Term Goals: Engage patient in aftercare planning with referrals and resources  Therapeutic Interventions: Assess for all discharge needs, 1 to 1 time with Social worker, Explore available resources and support systems, Assess for adequacy in community support network, Educate family and significant other(s) on suicide prevention, Complete Psychosocial Assessment, Interpersonal group therapy.  Evaluation of Outcomes: Progressing  Progress in Treatment: Attending groups: Yes Participating in groups: Yes Taking medication as prescribed: Yes. Toleration medication:  No. Family/Significant other contact made: SPE completed with pt;pt declined to consent to collateral contact.  Patient understands diagnosis: Yes. Discussing patient identified problems/goals with staff: Yes. Medical problems stabilized or resolved: Yes. Denies suicidal/homicidal ideation: Yes Issues/concerns per patient self-inventory: No. Other:   New problem(s) identified: None  New Short Term/Long Term Goal(s):medication stabilization, elimination of SI thoughts, development of comprehensive mental wellness plan.   Patient Goals:  "Help me get better. "  Discharge Plan or Barriers: Pt has [facility] screening for possible admission on Thursday, 10/10 at 7:45AM. [facility] Mixer for outpatient mental health services. MHAG pamphlet, Mobile Crisis information, and AA/NA information provided to patient for additional community support and resources.   Reason for Continuation of Hospitalization: Depression Medication stabilization  Estimated Length of Stay: Thursday morning 06/30/18  Attendees: Patient: 06/27/2018 9:10 AM  Physician: Dr. Jolyne Loa, MD 06/27/2018 9:10 AM  Nursing: Lincoln Maxin.W, RN; [place] RN 06/27/2018 9:10 AM  RN Care Manager:x 06/27/2018 9:10 AM  Social Worker: Corrie Mckusick LCSW 06/27/2018 9:10 AM  Recreational Therapist: Juliann Pares 06/27/2018 9:10 AM  Other: Hillery Jacks NP 06/27/2018 9:10 AM  Other:  06/27/2018 9:10 AM  Other: 06/27/2018 9:10 AM    Scribe for Treatment Team: Rona Ravens, LCSW 06/27/2018 9:10 AM
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
## 2018-06-27 NOTE — Plan of Care (Signed)
Problem: Health Behavior/Discharge Planning: Goal: Identification of resources available to assist in meeting health care needs will improve Outcome: Progressing   Problem: Safety: Goal: Periods of time without injury will increase Outcome: Progressing   Problem: Medication: Goal: Compliance with prescribed medication regimen will improve Outcome: Progressing DAR Note: Pt visible in milieu at intervals during shift. Presents anxious on interactions. Denies SI, HI and AVH when assessed. Continues to complain of unrelieved pain with prescribed PRN Tylenol & Naproxen. Reports she's sleeping well with fair appetite, low energy and poor concentration level. Rates her depression 7/10, hopelessness 6/10  and anxiety 9/10 on self inventory sheet. Pt did not attend groups despite multiple prompts. Remains compliant with medications when offered. Denies adverse drug reactions when assessed.  Emotional support and availability provided to pt. Scheduled and PRN (Tylenol & Vistaril) medications given as ordered with verbal education and effects monitored. Treatment team made aware of  pt's complain of unrelieved pain. Safety checks maintained without self harm gestures or outburst thus far.  POC maintained for safety and mood stability.

## 2018-06-28 LAB — T3, FREE: T3, Free: 1.9 pg/mL — ABNORMAL LOW (ref 2.0–4.4)
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
## 2018-06-28 NOTE — B[facility] Group Notes (Signed)
Pt attended spiritual care group on grief and loss facilitated by chaplain Priest Lockridge   Group goal of establishing open and affirming space for members to share loss and experience with grief, normalize grief experience and provide psycho social education and grief support.  Group opened with facilitated discussion and psycho-social ed around grief and loss.  Group noted loss in connection to relationships and in relation to self.  Group identifyed life patterns, circumstances, changes that precipitate grief responses.   Group engaged in reflection on Worden's four tasks of grief.  Provided support to one another in group context.    Group facilitation drew on Narrative and Adlerian framework.    Verla Bryngelson,  MDiv, BCC 

## 2018-06-28 NOTE — Progress Notes (Signed)
DAR Note: Pt A & O X3. Denies SI, HI and AVH. Presents animated and pleasant on interactions. C/O of increased fatigue "I'm just too tired and weak". Visible in groups as scheduled and was engaged. Compliant with medications as ordered. Denies adverse drug reactions when assessed. Pt scheduled for d/c tomorrow due to court date on Thursday and she's in agreement.  Scheduled and PRN medications administered as ordered with verbal education and effects monitored. Emotional support and reassurance provided to pt. Encouraged pt to voice concerns, attend to ADLs and comply with current treatment regimen including groups. Safety checks maintained without self harm gestures or outburst.  Pt receptive to care. Off unit for meals and activities. Tolerates all PO intake well. Remains safe on and off unit.

## 2018-06-28 NOTE — [facility] Group Notes (Signed)
[facility] Group Therapy 06/28/2018 1:15pm  Type of Therapy: Mental Health Association Presentation  Participation Level: Invited. Chose to remain in bed.   Rona Ravens, LCSW 06/28/2018 2:03 PM

## 2018-06-29 MED ORDER — GABAPENTIN 300 MG PO CAPS: 300.0000 mg | ORAL_CAPSULE | Freq: Three times a day (TID) | ORAL | 0 refills | Status: DC

## 2018-06-29 MED ORDER — NAPROXEN 500 MG PO TABS: 500.0000 mg | ORAL_TABLET | Freq: Two times a day (BID) | ORAL | 0 refills | Status: DC | PRN

## 2018-06-29 MED ORDER — HYDROXYZINE HCL 50 MG PO TABS: 50.0000 mg | ORAL_TABLET | Freq: Four times a day (QID) | ORAL | 0 refills | Status: DC | PRN

## 2018-06-29 MED ORDER — LISINOPRIL 20 MG PO TABS: 20.0000 mg | ORAL_TABLET | Freq: Every day | ORAL | 0 refills | Status: DC

## 2018-06-29 MED ORDER — TRAZODONE HCL 50 MG PO TABS: 50.0000 mg | ORAL_TABLET | Freq: Every evening | ORAL | 0 refills | Status: DC | PRN

## 2018-06-29 MED ORDER — RISPERIDONE 1 MG PO TABS: 1.0000 mg | ORAL_TABLET | Freq: Two times a day (BID) | ORAL | 0 refills | Status: DC

## 2018-06-29 MED ORDER — CLONIDINE HCL 0.1 MG PO TABS: 0.1000 mg | ORAL_TABLET | Freq: Two times a day (BID) | ORAL | 0 refills | Status: DC

## 2018-06-29 MED ORDER — PANTOPRAZOLE SODIUM 20 MG PO TBEC: 20.0000 mg | DELAYED_RELEASE_TABLET | Freq: Every day | ORAL | 0 refills | Status: DC

## 2018-06-29 MED ORDER — BUSPIRONE HCL 15 MG PO TABS: 15.0000 mg | ORAL_TABLET | Freq: Two times a day (BID) | ORAL | 0 refills | Status: DC

## 2018-06-29 MED ORDER — HYDROCHLOROTHIAZIDE 25 MG PO TABS: 25.0000 mg | ORAL_TABLET | Freq: Every day | ORAL | 0 refills | Status: DC

## 2018-06-29 MED ORDER — DIVALPROEX SODIUM 500 MG PO DR TAB: 500.0000 mg | DELAYED_RELEASE_TABLET | Freq: Two times a day (BID) | ORAL | 0 refills | Status: DC

## 2018-06-29 NOTE — Plan of Care (Signed)
D: Pt A & O X 3. Denies SI, HI, AVH and pain at this time. Patient anxious about discharge. Plan to follow-up with [facility] outpatient. Slept poorly due to anxiety, and received medication that did not help. Appetite is good, energy low, and concentration "okay". Rates depression 8/10, feeling of hopelessness 6/10, and anxiety 10/10. Complains of feeling irritable from withdrawal. Continues to complain of back pain 5/10. D/C home as ordered. Picked up in lobby by  A: Administered Vistaril for anxiety. D/C instructions reviewed with pt including prescriptions, medication samples and follow up appointment, compliance encouraged. All belongings from locker # given to pt at time of departure. Scheduled and PRN medications given with verbal education and effects monitored. Safety checks maintained without incident till time of d/c.  R: Pt receptive to care. Compliant with medications when offered. Denies adverse drug reactions when assessed. Verbalized understanding related to d/c instructions. Signed belonging sheet in agreement with items received from locker. Ambulatory with a steady gait. Appears to be in no physical distress at time of departure. Goal: "Staying clean. Stay away from place, people."
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
## 2018-06-29 NOTE — Progress Notes (Signed)
D: Pt was in hallway upon initial approach.  Pt presents with appropriate affect and anxious mood.  Pt seen smiling and laughing at times.  She describes her day as "good" and reports goal is to "feel healthy."  Pt reports she is discharging tomorrow and she feels safe to do so.  She discussed how she will go to court Thursday and then seek treatment at [facility].  Pt denies SI/HI, denies hallucinations, reports back pain of 5/10.  Pt has been visible in milieu interacting with peers and staff appropriately.  She did not attend evening group.    A: Met with pt 1:1.  Actively listened to pt and offered support and encouragement. Medications administered per order.  PRN medication administered for pain, sleep, and anxiety.  Q15 minute safety checks maintained.  R: Pt is safe on the unit.  Pt is compliant with medications.  Pt verbally contracts for safety.  Will continue to monitor and assess.
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
## 2018-06-29 NOTE — Plan of Care (Signed)
  Problem: Activity: Goal: Imbalance in normal sleep/wake cycle will improve Outcome: Progressing Note:  Pt slept 6.25 hour last night per flowsheet.

## 2018-06-29 NOTE — Therapy (Signed)
Occupational Therapy Group Note  Date:  06/29/2018 Time:  1:21 PM  Group Topic/Focus:  Self Esteem  Participation Level:  Minimal  Participation Quality:  Inattentive  Affect:  Depressed and Flat  Cognitive:  Appropriate  Insight: Lacking  Engagement in Group:  Limited  Modes of Intervention:  Activity, Discussion, Education and Socialization  Additional Comments:    S: no subjective offered this date  O: OT tx with focus on self esteem building this date. Education given on definition of self esteem, with both causes of low and high self esteem identified. Activity given for pt to identify a positive/aspiring trait for each letter of the alphabet. Pt to work with peers to help complete activity and build positive thinking.   A: Pt presents to group with flat/depressed affect. Pt completed other paperwork during group time and did not contribute to any discussion or activity.   P: Education given on self esteem and how to improve this date. Handouts and activities given to help facilitate skills when reintegrating into community.   Dalphine Handing, MSOT, OTR/L Behavioral Health OT/ Acute Relief OT  Dalphine Handing 06/29/2018, 1:21 PM

## 2018-06-29 NOTE — Progress Notes (Signed)
Recreation Therapy Notes  Date: 10.9.19 Time: 0930 Location: [address] Dayroom  Group Topic: Stress Management  Goal Area(s) Addresses:  Patient will verbalize importance of using healthy stress management.  Patient will identify positive emotions associated with healthy stress management.   Behavioral Response: Engaged  Intervention: Stress Management  Activity :  Guided Imagery.  LRT introduced the stress management technique of guided imagery.  LRT read a script that allowed patients to envision a starry sky at night.  Patients were to listen as the script was read to engage in the activity.  Education:  Stress Management, Discharge Planning.   Education Outcome: Acknowledges edcuation/In group clarification offered/Needs additional education  Clinical Observations/Feedback:  Pt attended and participated in group.     Caroll Rancher, LRT/CTRS         Caroll Rancher A 06/29/2018 12:27 PM
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
## 2018-07-13 ENCOUNTER — Encounter (HOSPITAL_COMMUNITY): Payer: Self-pay | Admitting: *Deleted

## 2018-07-13 ENCOUNTER — Emergency Department (HOSPITAL_COMMUNITY)
Admission: EM | Admit: 2018-07-13 | Discharge: 2018-07-14 | Disposition: A | Discharge status: Other Acute Inpatient Hospital | Payer: Self-pay | Attending: Emergency Medicine | Admitting: Emergency Medicine

## 2018-07-13 ENCOUNTER — Other Ambulatory Visit: Payer: Self-pay

## 2018-07-13 DIAGNOSIS — F1410 Cocaine abuse, uncomplicated: Secondary | ICD-10-CM | POA: Insufficient documentation

## 2018-07-13 DIAGNOSIS — F419 Anxiety disorder, unspecified: Secondary | ICD-10-CM | POA: Insufficient documentation

## 2018-07-13 DIAGNOSIS — F1120 Opioid dependence, uncomplicated: Secondary | ICD-10-CM | POA: Insufficient documentation

## 2018-07-13 DIAGNOSIS — F17210 Nicotine dependence, cigarettes, uncomplicated: Secondary | ICD-10-CM | POA: Insufficient documentation

## 2018-07-13 DIAGNOSIS — F332 Major depressive disorder, recurrent severe without psychotic features: Secondary | ICD-10-CM | POA: Insufficient documentation

## 2018-07-13 DIAGNOSIS — Z79899 Other long term (current) drug therapy: Secondary | ICD-10-CM | POA: Insufficient documentation

## 2018-07-13 DIAGNOSIS — R45851 Suicidal ideations: Secondary | ICD-10-CM | POA: Insufficient documentation

## 2018-07-13 NOTE — ED Triage Notes (Signed)
Pt brought in by [employer] d/t pt stating was homeless and suicidal.  Pt stated "I guess my plan is to OD.  I do heroin and did it yesterday. "

## 2018-07-14 ENCOUNTER — Encounter (HOSPITAL_COMMUNITY): Payer: Self-pay

## 2018-07-14 ENCOUNTER — Inpatient Hospital Stay (HOSPITAL_COMMUNITY)
Admission: AD | Admit: 2018-07-14 | Discharge: 2018-07-19 | DRG: 885 | Disposition: A | Discharge status: Home / Self Care | Payer: Federal, State, Local not specified - Other | Source: Intra-hospital | Attending: Psychiatry | Admitting: Psychiatry

## 2018-07-14 DIAGNOSIS — F1420 Cocaine dependence, uncomplicated: Secondary | ICD-10-CM | POA: Diagnosis present

## 2018-07-14 DIAGNOSIS — F1920 Other psychoactive substance dependence, uncomplicated: Secondary | ICD-10-CM | POA: Diagnosis present

## 2018-07-14 DIAGNOSIS — B182 Chronic viral hepatitis C: Secondary | ICD-10-CM | POA: Diagnosis present

## 2018-07-14 DIAGNOSIS — G4700 Insomnia, unspecified: Secondary | ICD-10-CM | POA: Diagnosis present

## 2018-07-14 DIAGNOSIS — Z79899 Other long term (current) drug therapy: Secondary | ICD-10-CM

## 2018-07-14 DIAGNOSIS — M797 Fibromyalgia: Secondary | ICD-10-CM | POA: Diagnosis present

## 2018-07-14 DIAGNOSIS — F10939 Alcohol use, unspecified with withdrawal, unspecified: Secondary | ICD-10-CM | POA: Diagnosis present

## 2018-07-14 DIAGNOSIS — Z590 Homelessness: Secondary | ICD-10-CM

## 2018-07-14 DIAGNOSIS — Z8249 Family history of ischemic heart disease and other diseases of the circulatory system: Secondary | ICD-10-CM | POA: Diagnosis not present

## 2018-07-14 DIAGNOSIS — F1410 Cocaine abuse, uncomplicated: Secondary | ICD-10-CM | POA: Diagnosis not present

## 2018-07-14 DIAGNOSIS — F10239 Alcohol dependence with withdrawal, unspecified: Secondary | ICD-10-CM | POA: Diagnosis present

## 2018-07-14 DIAGNOSIS — F4310 Post-traumatic stress disorder, unspecified: Secondary | ICD-10-CM | POA: Diagnosis present

## 2018-07-14 DIAGNOSIS — I10 Essential (primary) hypertension: Secondary | ICD-10-CM | POA: Diagnosis present

## 2018-07-14 DIAGNOSIS — Y902 Blood alcohol level of 40-59 mg/100 ml: Secondary | ICD-10-CM | POA: Diagnosis present

## 2018-07-14 DIAGNOSIS — K219 Gastro-esophageal reflux disease without esophagitis: Secondary | ICD-10-CM | POA: Diagnosis present

## 2018-07-14 DIAGNOSIS — R45851 Suicidal ideations: Secondary | ICD-10-CM | POA: Diagnosis present

## 2018-07-14 DIAGNOSIS — Z90721 Acquired absence of ovaries, unilateral: Secondary | ICD-10-CM

## 2018-07-14 DIAGNOSIS — F314 Bipolar disorder, current episode depressed, severe, without psychotic features: Principal | ICD-10-CM | POA: Diagnosis present

## 2018-07-14 DIAGNOSIS — F1110 Opioid abuse, uncomplicated: Secondary | ICD-10-CM | POA: Diagnosis present

## 2018-07-14 DIAGNOSIS — F332 Major depressive disorder, recurrent severe without psychotic features: Secondary | ICD-10-CM | POA: Diagnosis present

## 2018-07-14 DIAGNOSIS — Z8611 Personal history of tuberculosis: Secondary | ICD-10-CM | POA: Diagnosis not present

## 2018-07-14 DIAGNOSIS — F17200 Nicotine dependence, unspecified, uncomplicated: Secondary | ICD-10-CM | POA: Diagnosis present

## 2018-07-14 DIAGNOSIS — F419 Anxiety disorder, unspecified: Secondary | ICD-10-CM | POA: Diagnosis not present

## 2018-07-14 LAB — SALICYLATE LEVEL

## 2018-07-14 LAB — CBC
HEMATOCRIT: 37.6 % (ref 36.0–46.0)
Hemoglobin: 12 g/dL (ref 12.0–15.0)
MCH: 29.4 pg (ref 26.0–34.0)
MCHC: 31.9 g/dL (ref 30.0–36.0)
MCV: 92.2 fL (ref 80.0–100.0)
NRBC: 0 % (ref 0.0–0.2)
Platelets: 225 10*3/uL (ref 150–400)
RBC: 4.08 MIL/uL (ref 3.87–5.11)
RDW: 15.5 % (ref 11.5–15.5)
WBC: 7.5 10*3/uL (ref 4.0–10.5)

## 2018-07-14 LAB — COMPREHENSIVE METABOLIC PANEL
ALK PHOS: 55 U/L (ref 38–126)
ALT: 17 U/L (ref 0–44)
AST: 19 U/L (ref 15–41)
Albumin: 3.5 g/dL (ref 3.5–5.0)
Anion gap: 10 (ref 5–15)
BILIRUBIN TOTAL: 0.4 mg/dL (ref 0.3–1.2)
BUN: 14 mg/dL (ref 6–20)
CALCIUM: 9 mg/dL (ref 8.9–10.3)
CHLORIDE: 106 mmol/L (ref 98–111)
CO2: 24 mmol/L (ref 22–32)
CREATININE: 0.57 mg/dL (ref 0.44–1.00)
Glucose, Bld: 96 mg/dL (ref 70–99)
Potassium: 3.4 mmol/L — ABNORMAL LOW (ref 3.5–5.1)
Sodium: 140 mmol/L (ref 135–145)
TOTAL PROTEIN: 7.2 g/dL (ref 6.5–8.1)

## 2018-07-14 LAB — RAPID URINE DRUG SCREEN, HOSP PERFORMED
Amphetamines: NOT DETECTED
BARBITURATES: NOT DETECTED
BENZODIAZEPINES: NOT DETECTED
Cocaine: POSITIVE — AB
Opiates: POSITIVE — AB
Tetrahydrocannabinol: NOT DETECTED

## 2018-07-14 LAB — PREGNANCY, URINE: PREG TEST UR: NEGATIVE

## 2018-07-14 LAB — ETHANOL: Alcohol, Ethyl (B): 45 mg/dL — ABNORMAL HIGH (ref <10)

## 2018-07-14 LAB — ACETAMINOPHEN LEVEL: Acetaminophen (Tylenol), Serum: <10 ug/mL — ABNORMAL LOW (ref 10–30)

## 2018-07-14 MED ORDER — ACETAMINOPHEN 325 MG PO TABS
650.0000 mg | ORAL_TABLET | Freq: Once | ORAL | Status: AC
  Administered 2018-07-14: 650 mg via ORAL
  Filled 2018-07-14: qty 2

## 2018-07-14 MED ORDER — RISPERIDONE 1 MG PO TABS
1.0000 mg | ORAL_TABLET | Freq: Two times a day (BID) | ORAL | Status: DC
  Administered 2018-07-14 – 2018-07-18 (×9): 1 mg via ORAL
  Filled 2018-07-14: qty 28
  Filled 2018-07-14 (×3): qty 1
  Filled 2018-07-14 (×2): qty 28
  Filled 2018-07-14 (×2): qty 1
  Filled 2018-07-14: qty 28
  Filled 2018-07-14 (×5): qty 1

## 2018-07-14 MED ORDER — CHLORDIAZEPOXIDE HCL 25 MG PO CAPS
25.0000 mg | ORAL_CAPSULE | Freq: Three times a day (TID) | ORAL | Status: DC | PRN
  Administered 2018-07-15 – 2018-07-17 (×5): 25 mg via ORAL
  Filled 2018-07-14 (×5): qty 1

## 2018-07-14 MED ORDER — HYDROXYZINE HCL 25 MG PO TABS
25.0000 mg | ORAL_TABLET | Freq: Four times a day (QID) | ORAL | Status: DC | PRN
  Administered 2018-07-15 – 2018-07-18 (×4): 25 mg via ORAL
  Filled 2018-07-14 (×5): qty 1

## 2018-07-14 MED ORDER — NICOTINE 21 MG/24HR TD PT24
21.0000 mg | MEDICATED_PATCH | Freq: Every day | TRANSDERMAL | Status: DC
  Administered 2018-07-14 – 2018-07-18 (×5): 21 mg via TRANSDERMAL
  Filled 2018-07-14 (×4): qty 1
  Filled 2018-07-14: qty 14
  Filled 2018-07-14 (×3): qty 1
  Filled 2018-07-14: qty 14

## 2018-07-14 MED ORDER — CLONIDINE HCL 0.1 MG PO TABS
0.1000 mg | ORAL_TABLET | Freq: Four times a day (QID) | ORAL | Status: AC
  Administered 2018-07-14 – 2018-07-16 (×7): 0.1 mg via ORAL
  Filled 2018-07-14 (×8): qty 1

## 2018-07-14 MED ORDER — FOLIC ACID 1 MG PO TABS
1.0000 mg | ORAL_TABLET | Freq: Every day | ORAL | Status: DC
  Administered 2018-07-14 – 2018-07-18 (×5): 1 mg via ORAL
  Filled 2018-07-14 (×8): qty 1

## 2018-07-14 MED ORDER — CLONIDINE HCL 0.1 MG PO TABS
0.1000 mg | ORAL_TABLET | Freq: Every day | ORAL | Status: DC
  Filled 2018-07-14 (×2): qty 1

## 2018-07-14 MED ORDER — GABAPENTIN 300 MG PO CAPS
300.0000 mg | ORAL_CAPSULE | Freq: Three times a day (TID) | ORAL | Status: DC
  Administered 2018-07-14 – 2018-07-17 (×8): 300 mg via ORAL
  Filled 2018-07-14 (×12): qty 1

## 2018-07-14 MED ORDER — ACETAMINOPHEN 325 MG PO TABS
650.0000 mg | ORAL_TABLET | Freq: Four times a day (QID) | ORAL | Status: DC | PRN
  Administered 2018-07-14 – 2018-07-18 (×6): 650 mg via ORAL
  Filled 2018-07-14 (×4): qty 2

## 2018-07-14 MED ORDER — TRAZODONE HCL 50 MG PO TABS
50.0000 mg | ORAL_TABLET | Freq: Every evening | ORAL | Status: DC | PRN
  Administered 2018-07-14 – 2018-07-18 (×7): 50 mg via ORAL
  Filled 2018-07-14 (×2): qty 1
  Filled 2018-07-14: qty 28
  Filled 2018-07-14 (×3): qty 1
  Filled 2018-07-14: qty 28
  Filled 2018-07-14: qty 1
  Filled 2018-07-14: qty 28
  Filled 2018-07-14 (×4): qty 1
  Filled 2018-07-14: qty 28

## 2018-07-14 MED ORDER — ALUM & MAG HYDROXIDE-SIMETH 200-200-20 MG/5ML PO SUSP: 30.0000 mL | ORAL | Status: DC | PRN

## 2018-07-14 MED ORDER — VITAMIN B-1 100 MG PO TABS
100.0000 mg | ORAL_TABLET | Freq: Every day | ORAL | Status: DC
  Administered 2018-07-14 – 2018-07-18 (×5): 100 mg via ORAL
  Filled 2018-07-14 (×8): qty 1

## 2018-07-14 MED ORDER — CLONIDINE HCL 0.1 MG PO TABS
0.1000 mg | ORAL_TABLET | ORAL | Status: AC
  Administered 2018-07-16 – 2018-07-18 (×4): 0.1 mg via ORAL
  Filled 2018-07-14 (×4): qty 1

## 2018-07-14 MED ORDER — MAGNESIUM HYDROXIDE 400 MG/5ML PO SUSP: 30.0000 mL | Freq: Every day | ORAL | Status: DC | PRN

## 2018-07-14 MED ORDER — METHOCARBAMOL 500 MG PO TABS
500.0000 mg | ORAL_TABLET | Freq: Three times a day (TID) | ORAL | Status: DC | PRN
  Administered 2018-07-15 – 2018-07-18 (×6): 500 mg via ORAL
  Filled 2018-07-14 (×5): qty 1

## 2018-07-14 MED ORDER — NAPROXEN 500 MG PO TABS
500.0000 mg | ORAL_TABLET | Freq: Two times a day (BID) | ORAL | Status: DC | PRN
  Administered 2018-07-15 – 2018-07-18 (×4): 500 mg via ORAL
  Filled 2018-07-14 (×4): qty 1

## 2018-07-14 MED ORDER — DICYCLOMINE HCL 20 MG PO TABS
20.0000 mg | ORAL_TABLET | Freq: Four times a day (QID) | ORAL | Status: DC | PRN
  Administered 2018-07-15 – 2018-07-18 (×2): 20 mg via ORAL
  Filled 2018-07-14 (×2): qty 1

## 2018-07-14 MED ORDER — PANTOPRAZOLE SODIUM 40 MG PO TBEC
40.0000 mg | DELAYED_RELEASE_TABLET | Freq: Every day | ORAL | Status: DC
  Administered 2018-07-14 – 2018-07-18 (×5): 40 mg via ORAL
  Filled 2018-07-14 (×3): qty 1
  Filled 2018-07-14: qty 14
  Filled 2018-07-14 (×3): qty 1
  Filled 2018-07-14: qty 14
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
## 2018-07-14 NOTE — ED Notes (Signed)
TTS assessment in progress. 

## 2018-07-14 NOTE — [facility] Suicide Risk Assessment (Signed)
[facility] INPATIENT:  Family/Significant Other Suicide Prevention Education  Suicide Prevention Education:  Patient Refusal for Family/Significant Other Suicide Prevention Education: The patient Ariana Jensen has refused to provide written consent for family/significant other to be provided Family/Significant Other Suicide Prevention Education during admission and/or prior to discharge.  Physician notified.  SPE completed with pt, as pt refused to consent to family contact. SPI pamphlet provided to pt and pt was encouraged to share information with support network, ask questions, and talk about any concerns relating to SPE. Pt denies access to guns/firearms and verbalized understanding of information provided. Mobile Crisis information also provided to pt.   Rona Ravens LCSW 07/14/2018, 2:33 PM
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
## 2018-07-14 NOTE — ED Notes (Signed)
Report called  

## 2018-07-14 NOTE — ED Notes (Signed)
Pt c/o left shoulder pain.  Dr. Wilkie Aye informed.  Verbal orders received.  See orders.

## 2018-07-14 NOTE — ED Notes (Signed)
[facility] called 
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
## 2018-07-14 NOTE — [facility] Group Notes (Signed)
LCSW Group Therapy Note  07/14/2018 1:15pm  Type of Therapy and Topic:  Group Therapy: Avoiding Self-Sabotaging and Enabling Behaviors  Participation Level:  Did Not Attend--pt invited. Chose to remain in bed.    Description of Group:   In this group, patients will learn how to identify obstacles, self-sabotaging and enabling behaviors, as well as: what are they, why do we do them and what needs these behaviors meet. Discuss unhealthy relationships and how to have positive healthy boundaries with those that sabotage and enable. Explore aspects of self-sabotage and enabling in yourself and how to limit these self-destructive behaviors in everyday life.   Therapeutic Goals: 1. Patient will identify one obstacle that relates to self-sabotage and enabling behaviors 2. Patient will identify one personal self-sabotaging or enabling behavior they did prior to admission 3. Patient will state a plan to change the above identified behavior 4. Patient will demonstrate ability to communicate their needs through discussion and/or role play.   Summary of Patient Progress:   x  Therapeutic Modalities:   Cognitive Behavioral Therapy Person-Centered Therapy Motivational Interviewing   Rona Ravens, LCSW 07/14/2018 4:00 PM

## 2018-07-14 NOTE — Tx Team (Signed)
Initial Treatment Plan 07/14/2018 11:57 AM [name] ZOX:[id number]    PATIENT STRESSORS: Financial difficulties Substance abuse Traumatic event   PATIENT STRENGTHS: Ability for insight Active sense of humor Average or above average intelligence Capable of independent living Metallurgist fund of knowledge Motivation for treatment/growth Physical Health Religious Affiliation Special hobby/interest Supportive family/friends Work skills   PATIENT IDENTIFIED PROBLEMS: "get clean"  Suicide Risk  Depression  Substance Abuse               DISCHARGE CRITERIA:  Ability to meet basic life and health needs Adequate post-discharge living arrangements Improved stabilization in mood, thinking, and/or behavior Medical problems require only outpatient monitoring Motivation to continue treatment in a less acute level of care Need for constant or close observation no longer present Reduction of life-threatening or endangering symptoms to within safe limits Safe-care adequate arrangements made Verbal commitment to aftercare and medication compliance Withdrawal symptoms are absent or subacute and managed without 24-hour nursing intervention  PRELIMINARY DISCHARGE PLAN: Outpatient therapy  PATIENT/FAMILY INVOLVEMENT: This treatment plan has been presented to and reviewed with the patient, [name].  The patient and family have been given the opportunity to ask questions and make suggestions.  Ferrel Logan, RN 07/14/2018, 11:57 AM
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
## 2018-07-15 MED ORDER — AMLODIPINE BESYLATE 5 MG PO TABS
5.0000 mg | ORAL_TABLET | Freq: Every day | ORAL | Status: DC
  Administered 2018-07-15 – 2018-07-18 (×4): 5 mg via ORAL
  Filled 2018-07-15 (×4): qty 1
  Filled 2018-07-15: qty 14
  Filled 2018-07-15: qty 1

## 2018-07-15 NOTE — Progress Notes (Signed)
Adult Psychoeducational Group Note  Date:  07/15/2018 Time:  1:54 AM  Group Topic/Focus:  Wrap-Up Group:   The focus of this group is to help patients review their daily goal of treatment and discuss progress on daily workbooks.  Participation Level:  Active  Participation Quality:  Drowsy  Affect:  Lethargic  Cognitive:  Appropriate  Insight: Good  Engagement in Group:  Engaged  Modes of Intervention:  Discussion  Additional Comments:  Pt was admitted to [facility] today and therefore does not have any set goals worked on today.  Pt stated it has been a rough day but she is hopefull that tomorrow is going to be a better day.  Pt rated the day at 1/10.  Ariana Jensen 07/15/2018, 1:54 AM
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
## 2018-07-15 NOTE — Progress Notes (Signed)
Recreation Therapy Notes  Date: 10.25.19 Time: 0930 Location: 300 [address] Dayroom  Group Topic: Stress Management  Goal Area(s) Addresses:  Patient will verbalize importance of using healthy stress management.  Patient will identify positive emotions associated with healthy stress management.   Intervention: Stress Management  Activity :  Meditation. LRT introduced the stress management technique of meditation.  LRT played a meditation for patients to engage in a meditation on gratitude.  Patients were to listen and follow along as the meditation was played.  Education:  Stress Management, Discharge Planning.   Education Outcome: Acknowledges edcuation/In group clarification offered/Needs additional education  Clinical Observations/Feedback:  Pt did not attend group.    Caroll Rancher, LRT/CTRS         Caroll Rancher A 07/15/2018 11:41 AM

## 2018-07-15 NOTE — Progress Notes (Signed)
D: Pt was in her room with visitor upon initial approach.  Pt presents with anxious affect and mood.  She reports she is "okay, just been sore."  Her goal is to "try to get feeling better."  Pt denies SI/HI, denies hallucinations, reports generalized pain of 7/10.  Pt has been visible in milieu interacting with peers and staff appropriately.  Pt attended evening group.    A: Introduced self to pt.  Met with pt 1:1.  Actively listened to pt and offered support and encouragement. Medications administered per order.  PRN medication administered for anxiety, muscle spasms, and abdominal cramps.  Fall prevention techniques reviewed with pt and she verbalized understanding.  PO fluids encouraged and provided.  Q15 minute safety checks maintained.  R: Pt is safe on the unit.  Pt is compliant with medications.  Pt verbally contracts for safety.  Will continue to monitor and assess.

## 2018-07-15 NOTE — [facility] Group Notes (Signed)
LCSW Group Therapy Note  07/15/2018 1:15pm  Type of Therapy and Topic:  Group Therapy:  Feelings around Relapse and Recovery  Participation Level:  Did Not Attend--pt invited. Chose to remain in bed.    Description of Group:    Patients in this group will discuss emotions they experience before and after a relapse. They will process how experiencing these feelings, or avoidance of experiencing them, relates to having a relapse. Facilitator will guide patients to explore emotions they have related to recovery. Patients will be encouraged to process which emotions are more powerful. They will be guided to discuss the emotional reaction significant others in their lives may have to their relapse or recovery. Patients will be assisted in exploring ways to respond to the emotions of others without this contributing to a relapse.  Therapeutic Goals: 1. Patient will identify two or more emotions that lead to a relapse for them 2. Patient will identify two emotions that result when they relapse 3. Patient will identify two emotions related to recovery 4. Patient will demonstrate ability to communicate their needs through discussion and/or role plays   Summary of Patient Progress:   x  Therapeutic Modalities:   Cognitive Behavioral Therapy Solution-Focused Therapy Assertiveness Training Relapse Prevention Therapy   Rona Ravens, LCSW 07/15/2018 1:19 PM

## 2018-07-15 NOTE — Tx Team (Signed)
Interdisciplinary Treatment and Diagnostic Plan Update  07/15/2018 Time of Session: 0830AM Ariana Jensen MRN: [id number]  Principal Diagnosis: MDD, recurrent, severe  Secondary Diagnoses: Active Problems:   MDD (major depressive disorder), recurrent episode, severe (HCC)   Alcohol withdrawal syndrome with complication (HCC)   Polysubstance dependence (HCC)   Severe bipolar I disorder, most recent episode depressed (HCC)   Current Medications:  Current Facility-Administered Medications  Medication Dose Route Frequency Provider Last Rate Last Dose  . acetaminophen (TYLENOL) tablet 650 mg  650 mg Oral Q6H PRN Kerry Hough, PA-C   650 mg at 07/15/18 4782  . alum & mag hydroxide-simeth (MAALOX/MYLANTA) 200-200-20 MG/5ML suspension 30 mL  30 mL Oral Q4H PRN Donell Sievert E, PA-C      . amLODipine (NORVASC) tablet 5 mg  5 mg Oral Daily Antonieta Pert, MD      . chlordiazePOXIDE (LIBRIUM) capsule 25 mg  25 mg Oral TID PRN Antonieta Pert, MD   25 mg at 07/15/18 9562  . cloNIDine (CATAPRES) tablet 0.1 mg  0.1 mg Oral QID Antonieta Pert, MD   0.1 mg at 07/15/18 0622   Followed by  . [START ON 07/16/2018] cloNIDine (CATAPRES) tablet 0.1 mg  0.1 mg Oral BH-qamhs Clary, Marlane Mingle, MD       Followed by  . [START ON 07/19/2018] cloNIDine (CATAPRES) tablet 0.1 mg  0.1 mg Oral QAC breakfast Antonieta Pert, MD      . dicyclomine (BENTYL) tablet 20 mg  20 mg Oral Q6H PRN Antonieta Pert, MD      . folic acid (FOLVITE) tablet 1 mg  1 mg Oral Daily Antonieta Pert, MD   1 mg at 07/15/18 1308  . gabapentin (NEURONTIN) capsule 300 mg  300 mg Oral TID Antonieta Pert, MD   300 mg at 07/15/18 6578  . hydrOXYzine (ATARAX/VISTARIL) tablet 25 mg  25 mg Oral Q6H PRN Donell Sievert E, PA-C      . magnesium hydroxide (MILK OF MAGNESIA) suspension 30 mL  30 mL Oral Daily PRN Donell Sievert E, PA-C      . methocarbamol (ROBAXIN) tablet 500 mg  500 mg Oral Q8H PRN Antonieta Pert,  MD   500 mg at 07/15/18 1046  . naproxen (NAPROSYN) tablet 500 mg  500 mg Oral BID PRN Antonieta Pert, MD      . nicotine (NICODERM CQ - dosed in mg/24 hours) patch 21 mg  21 mg Transdermal Daily Antonieta Pert, MD   21 mg at 07/15/18 4696  . pantoprazole (PROTONIX) EC tablet 40 mg  40 mg Oral Daily Antonieta Pert, MD   40 mg at 07/15/18 2952  . risperiDONE (RISPERDAL) tablet 1 mg  1 mg Oral BID Antonieta Pert, MD   1 mg at 07/15/18 0809  . thiamine (VITAMIN B-1) tablet 100 mg  100 mg Oral Daily Antonieta Pert, MD   100 mg at 07/15/18 8413  . traZODone (DESYREL) tablet 50 mg  50 mg Oral QHS,MR X 1 Kerry Hough, PA-C   50 mg at 07/14/18 2121   PTA Medications: Medications Prior to Admission  Medication Sig Dispense Refill Last Dose  . busPIRone (BUSPAR) 15 MG tablet Take 1 tablet (15 mg total) by mouth 2 (two) times daily. For anxiety 60 tablet 0 Past Week at Unknown time  . cloNIDine (CATAPRES) 0.1 MG tablet Take 1 tablet (0.1 mg total) by mouth 2 (two) times daily. For high  blood pressure 60 tablet 0 Past Week at Unknown time  . divalproex (DEPAKOTE) 500 MG DR tablet Take 1 tablet (500 mg total) by mouth every 12 (twelve) hours. For mood control 60 tablet 0 Past Week at Unknown time  . gabapentin (NEURONTIN) 300 MG capsule Take 1 capsule (300 mg total) by mouth 4 (four) times daily - after meals and at bedtime. For mood stability 120 capsule 0 Past Week at Unknown time  . hydrochlorothiazide (HYDRODIURIL) 25 MG tablet Take 1 tablet (25 mg total) by mouth daily. For high blood pressure 30 tablet 0 Past Week at Unknown time  . hydrOXYzine (ATARAX/VISTARIL) 50 MG tablet Take 1 tablet (50 mg total) by mouth every 6 (six) hours as needed for anxiety. 30 tablet 0 Past Week at Unknown time  . lisinopril (PRINIVIL,ZESTRIL) 20 MG tablet Take 1 tablet (20 mg total) by mouth daily. For high blood pressure 30 tablet 0 Past Week at Unknown time  . naproxen (NAPROSYN) 500 MG tablet Take  1 tablet (500 mg total) by mouth 2 (two) times daily as needed (aching, pain, or discomfort). 30 tablet 0 Past Week at Unknown time  . pantoprazole (PROTONIX) 20 MG tablet Take 1 tablet (20 mg total) by mouth daily. For acid reflux 30 tablet 0 Past Week at Unknown time  . risperiDONE (RISPERDAL) 1 MG tablet Take 1 tablet (1 mg total) by mouth 2 (two) times daily. For mood control 60 tablet 0 Past Week at Unknown time  . traZODone (DESYREL) 50 MG tablet Take 1 tablet (50 mg total) by mouth at bedtime as needed for sleep. 30 tablet 0 Past Week at Unknown time    Patient Stressors: Financial difficulties Substance abuse Traumatic event  Patient Strengths: Ability for insight Active sense of humor Average or above average intelligence Capable of independent living Metallurgist fund of knowledge Motivation for treatment/growth Physical Health Religious Affiliation Special hobby/interest Supportive family/friends Work skills  Treatment Modalities: Medication Management, Group therapy, Case management,  1 to 1 session with clinician, Psychoeducation, Recreational therapy.   Physician Treatment Plan for Primary Diagnosis: MDD, recurrent, severe  Long Term Goal(s): Improvement in symptoms so as ready for discharge Improvement in symptoms so as ready for discharge   Short Term Goals: Ability to identify changes in lifestyle to reduce recurrence of condition will improve Ability to verbalize feelings will improve Ability to disclose and discuss suicidal ideas Ability to demonstrate self-control will improve Ability to identify and develop effective coping behaviors will improve Ability to maintain clinical measurements within normal limits will improve Compliance with prescribed medications will improve Ability to identify triggers associated with substance abuse/mental health issues will improve Ability to identify changes in lifestyle to reduce  recurrence of condition will improve Ability to verbalize feelings will improve Ability to disclose and discuss suicidal ideas Ability to demonstrate self-control will improve Ability to identify and develop effective coping behaviors will improve Ability to maintain clinical measurements within normal limits will improve Compliance with prescribed medications will improve Ability to identify triggers associated with substance abuse/mental health issues will improve  Medication Management: Evaluate patient's response, side effects, and tolerance of medication regimen.  Therapeutic Interventions: 1 to 1 sessions, Unit Group sessions and Medication administration.  Evaluation of Outcomes: Progressing  Physician Treatment Plan for Secondary Diagnosis: Active Problems:   MDD (major depressive disorder), recurrent episode, severe (HCC)   Alcohol withdrawal syndrome with complication (HCC)   Polysubstance dependence (HCC)   Severe bipolar I disorder,  most recent episode depressed (HCC)  Long Term Goal(s): Improvement in symptoms so as ready for discharge Improvement in symptoms so as ready for discharge   Short Term Goals: Ability to identify changes in lifestyle to reduce recurrence of condition will improve Ability to verbalize feelings will improve Ability to disclose and discuss suicidal ideas Ability to demonstrate self-control will improve Ability to identify and develop effective coping behaviors will improve Ability to maintain clinical measurements within normal limits will improve Compliance with prescribed medications will improve Ability to identify triggers associated with substance abuse/mental health issues will improve Ability to identify changes in lifestyle to reduce recurrence of condition will improve Ability to verbalize feelings will improve Ability to disclose and discuss suicidal ideas Ability to demonstrate self-control will improve Ability to identify and  develop effective coping behaviors will improve Ability to maintain clinical measurements within normal limits will improve Compliance with prescribed medications will improve Ability to identify triggers associated with substance abuse/mental health issues will improve     Medication Management: Evaluate patient's response, side effects, and tolerance of medication regimen.  Therapeutic Interventions: 1 to 1 sessions, Unit Group sessions and Medication administration.  Evaluation of Outcomes: Progressing   RN Treatment Plan for Primary Diagnosis: MDD, recurrent, severe  Long Term Goal(s): Knowledge of disease and therapeutic regimen to maintain health will improve  Short Term Goals: Ability to remain free from injury will improve, Ability to verbalize frustration and anger appropriately will improve, Ability to demonstrate self-control, Ability to disclose and discuss suicidal ideas and Ability to identify and develop effective coping behaviors will improve  Medication Management: RN will administer medications as ordered by provider, will assess and evaluate patient's response and provide education to patient for prescribed medication. RN will report any adverse and/or side effects to prescribing provider.  Therapeutic Interventions: 1 on 1 counseling sessions, Psychoeducation, Medication administration, Evaluate responses to treatment, Monitor vital signs and CBGs as ordered, Perform/monitor CIWA, COWS, AIMS and Fall Risk screenings as ordered, Perform wound care treatments as ordered.  Evaluation of Outcomes: Progressing   LCSW Treatment Plan for Primary Diagnosis:MDD, recurrent, severe  Long Term Goal(s): Safe transition to appropriate next level of care at discharge, Engage patient in therapeutic group addressing interpersonal concerns.  Short Term Goals: Engage patient in aftercare planning with referrals and resources, Increase emotional regulation and Identify triggers  associated with mental health/substance abuse issues  Therapeutic Interventions: Assess for all discharge needs, 1 to 1 time with Social worker, Explore available resources and support systems, Assess for adequacy in community support network, Educate family and significant other(s) on suicide prevention, Complete Psychosocial Assessment, Interpersonal group therapy.  Evaluation of Outcomes: Progressing   Progress in Treatment: Attending groups: Yes. Intermittently  Participating in groups: Yes. when she attends  Taking medication as prescribed: Yes. Toleration medication: Yes. Family/Significant other contact made: SPE completed with pt; pt declined to consent to collateral contact.  Patient understands diagnosis: Yes. Discussing patient identified problems/goals with staff: Yes. Medical problems stabilized or resolved: Yes. Denies suicidal/homicidal ideation: Yes. Issues/concerns per patient self-inventory: No. Other: n/a   New problem(s) identified: No, Describe:  n/a  New Short Term/Long Term Goal(s): detox, medication management for mood stabilization; elimination of SI thoughts; development of comprehensive mental wellness/sobriety plan.   Patient Goals:  "I want to get clean and get more treatment."   Discharge Plan or Barriers: CSW assessing for appropriate referrals. Pt requesting [facility] and [facility] referrals. [facility] pamphlet, [facility] information, and AA/NA information provided  to patient for additional community support and resources.  [facility] for outpatient mental health follow-up.   Reason for Continuation of Hospitalization: Anxiety Depression Medication stabilization Suicidal ideation Withdrawal symptoms  Estimated Length of Stay: Tuesday, 07/19/18  Attendees: Patient: 07/15/2018 11:33 AM  Physician: Dr. Jola Babinski MD; Dr. Altamese Lindon MD 07/15/2018 11:33 AM  Nursing: Alexia Freestone RN 07/15/2018 11:33 AM  RN Care Manager:x 07/15/2018 11:33 AM  Social Worker:  Corrie Mckusick LCSW 07/15/2018 11:33 AM  Recreational Therapist: x 07/15/2018 11:33 AM  Other: Armandina Stammer NP 07/15/2018 11:33 AM  Other:  07/15/2018 11:33 AM  Other: 07/15/2018 11:33 AM    Scribe for Treatment Team: Rona Ravens, LCSW 07/15/2018 11:33 AM

## 2018-07-16 MED ORDER — DIVALPROEX SODIUM ER 500 MG PO TB24
500.0000 mg | ORAL_TABLET | Freq: Every evening | ORAL | Status: DC
  Administered 2018-07-16 – 2018-07-18 (×3): 500 mg via ORAL
  Filled 2018-07-16 (×2): qty 14
  Filled 2018-07-16 (×3): qty 1

## 2018-07-16 NOTE — Plan of Care (Signed)
  Problem: Education: Goal: Knowledge of [facility] Education information/materials will improve Outcome: Progressing   

## 2018-07-16 NOTE — Progress Notes (Signed)
Patient did attend the first half of the evening speaker AA meeting. Pt left group and returned to her room.   

## 2018-07-16 NOTE — [facility] Group Notes (Signed)
Pt attended part of the AA meeting this evening. Pt left early and went to their room.

## 2018-07-16 NOTE — Progress Notes (Signed)
D Pt is observed OOB UAL on the 300 hall today tolerated well. She says " I slept last night and I feel better today". She makes good eye contact. She attempts to smile at this Clinical research associate. She has bathed this am and is wearing clean clothes. She says" I'm trying to make it".     A She completed her daily assessment andnon this she wrote she denied SI today and she rated her depression, hopelessness and anxiety " 9/9/9/", respectively. HEr affect is depressed ( but much LES S so today), flat and sad today.      R Safety in place.

## 2018-07-16 NOTE — Progress Notes (Signed)
[facility] MD Progress Note  07/16/2018 11:11 AM Ariana Jensen  MRN:  [id number] Subjective:  Patient is seen and examined. Patient is a 52 year old female with a past psychiatric history significant for polysubstance dependence and bipolar disorder who presented to the [facility] emergency department by [facility]. This was secondary to ongoing substance abuse with heroin and alcohol. The patient had a recent hospitalization at our facility for similar circumstances on 06/21/2018. Patient stated she is currently homeless. After this last hospitalization she returned to the home where she was staying with her reported boyfriend. Early after she returned to that domicile she was "kidnapped" by her boyfriend. According to the patient he is currently in jail, and after she had him arrested she had to leave the house. The house belongs to the parents of her significant other. She stated approximately 10 to 12 days after her admission she relapsed on substances again. The patient related that she had attempted to kill herself on 2 previous occasions, the most recent being 18 months ago via an overdose. She has been hospitalized for substance related issues for approximately 4-5 times in the last year and a half. She was discharged on Depakote, risk for doll, clonidine, Vistaril, trazodone, Protonix. She admitted to drinking at least a 12 pack a day, and was unable to quantitate the amount of opiates she had been using. She stated she is still been using heroin. She admitted to helplessness, hopelessness and worthlessness. She admitted to suicidal ideation. She was admitted to the hospital for evaluation and stabilization.  Objective: Patient is seen and examined.  Patient is a 52 year old female with the above-stated past psychiatric history who is seen in follow-up.  She still looks very disheveled.  She states she feels better, but still very shaky.  Nursing  notes reflect that she slept 5.75 hours last night.  She stated she is waking up about every 2 hours.  Her blood pressure is mildly elevated this morning, but she is afebrile.  Apparently she has refused her EKG the last 2 times.  Have discussed with her the need to know what her QTC is given her history.  She denied any suicidal or homicidal ideation.  She denied any auditory, visual or tactile hallucinations.  Principal Problem: <principal problem not specified> Diagnosis:   Patient Active Problem List   Diagnosis Date Noted  . MDD (major depressive disorder), recurrent episode, severe (HCC) [F33.2] 07/14/2018  . Alcohol withdrawal syndrome with complication (HCC) [F10.239]   . Polysubstance dependence (HCC) [F19.20]   . Severe bipolar I disorder, most recent episode depressed (HCC) [F31.4]   . PTSD (post-traumatic stress disorder) [F43.10] 06/22/2018  . Bipolar I disorder, most recent episode depressed, severe without psychotic features (HCC) [F31.4] 06/21/2018  . PVC's (premature ventricular contractions) [I49.3] 03/04/2018  . OD (overdose of drug) [T50.901A] 03/04/2018  . Leukocytosis [D72.829] 03/04/2018  . Hypokalemia [E87.6] 03/04/2018  . Acute drug intoxication with complication (HCC) [F19.929]   . Acute urinary retention [R33.8] 02/11/2018  . Acute renal failure (ARF) (HCC) [N17.9] 02/10/2018  . Closed fracture of left distal fibula [S82.832A] 02/10/2018  . Withdrawal complaint [R68.89] 05/12/2017  . Symptomatic bradycardia [R00.1] 05/12/2017  . Acute kidney injury (HCC) [N17.9] 08/18/2016  . Boils [L02.92] 08/18/2016  . Rhabdomyolysis [M62.82] 08/18/2016  . Urinary retention [R33.9] 08/18/2016  . ARF (acute renal failure) (HCC) [N17.9] 08/18/2016  . AKI (acute kidney injury) (HCC) [N17.9] 08/17/2016  . Hallucination [R44.3] 08/17/2016  . Nausea & vomiting [R11.2]  10/04/2015  . Pruritic dermatitis [L29.9] 09/10/2015  . Rash and nonspecific skin eruption [R21] 08/21/2015  .  Body lice [B85.1] 08/05/2015  . Prolonged QT interval [R94.31] 07/18/2015  . Dysmenorrhea [N94.6] 04/10/2015  . Trichimoniasis [A59.9] 04/10/2015  . Chest pain [R07.9] 03/07/2015  . Healthcare maintenance [Z00.00] 03/07/2015  . RLQ abdominal pain [R10.31] 02/18/2012  . GERD (gastroesophageal reflux disease) [K21.9] 01/28/2012  . Menorrhagia [N92.0] 01/25/2012  . Weakness generalized [R53.1] 12/05/2011  . Tobacco abuse [Z72.0] 11/06/2011  . HTN (hypertension) [I10] 10/22/2011  . Abscess and cellulitis [L03.90, L02.91] 10/22/2011  . Hepatitis C, chronic (HCC) [B18.2] 10/22/2011  . History of TB (tuberculosis) [Z86.11] 10/22/2011  . Opioid use disorder, severe, dependence (HCC) [F11.20]   . Polysubstance abuse (HCC) [F19.10] 04/17/2011   Total Time spent with patient: 15 minutes  Past Psychiatric History: See admission H&P  Past Medical History:  Past Medical History:  Diagnosis Date  . Anxiety   . ASCUS (atypical squamous cells of undetermined significance) on Pap smear   . Chronic lower back pain   . Cocaine abuse (HCC)    crack cocaine  . Depression   . Fibromyalgia   . GERD (gastroesophageal reflux disease)   . Hepatitis C   . Hepatitis C antibody test positive   . Hypertension   . Hypokalemia 03/04/2018  . Leukocytosis 03/04/2018  . Opioid dependence (HCC)   . PVC's (premature ventricular contractions) 03/04/2018  . Seizures (HCC)    "don't know why; I've had 2 in the past year" (08/18/2016)  . 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  . Trichomonas 04/17/2011   Diagnosed 04/02/11 during hospitalization, treated with Flagyl 2g, patient instructed to have partner treated (must follow-up)   . Tuberculosis    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 age 34, now s/p 1-yr of multidrug treatment (dates unknown)    Past Surgical History:  Procedure Laterality Date  . CESAREAN SECTION  1984  . DILATION AND CURETTAGE OF UTERUS    . I&D  EXTREMITY  10/18/2011   Procedure: IRRIGATION AND DEBRIDEMENT EXTREMITY;  Surgeon: Sharma Covert, MD;  Location: [facility] OR;  Service: Orthopedics;  Laterality: Left;  . I&D EXTREMITY  10/21/2011   Procedure: IRRIGATION AND DEBRIDEMENT EXTREMITY;  Surgeon: Sharma Covert, MD;  Location: [facility] OR;  Service: Orthopedics;  Laterality: Left;  . SALPINGECTOMY Left March 2010   ectopic pregnancy   Family History:  Family History  Problem Relation Age of Onset  . Heart attack Mother   . Heart attack Father   . Heart attack Paternal Grandmother   . Heart attack Paternal Grandfather   . Cirrhosis Brother    Family Psychiatric  History: See admission H&P Social History:  Social History   Substance and Sexual Activity  Alcohol Use Yes  . Alcohol/week: 0.0 standard drinks   Comment: occ     Social History   Substance and Sexual Activity  Drug Use Yes  . Types: Oxycodone, Heroin, Cocaine, IV   Comment: Pt stated "I do heroin"    Social History   Socioeconomic History  . Marital status: Widowed    Spouse name: Not on file  . Number of children: 1  . Years of education: 7th grade  . Highest education level: Not on file  Occupational History  . Not on file  Social Needs  . Financial resource strain: Not on file  . Food insecurity:    Worry: Not on file    Inability: Not on  file  . Transportation needs:    Medical: Not on file    Non-medical: Not on file  Tobacco Use  . Smoking status: Current Every Day Smoker    Packs/day: 0.50    Years: 32.00    Pack years: 16.00    Types: Cigarettes  . Smokeless tobacco: Never Used  Substance and Sexual Activity  . Alcohol use: Yes    Alcohol/week: 0.0 standard drinks    Comment: occ  . Drug use: Yes    Types: Oxycodone, Heroin, Cocaine, IV    Comment: Pt stated "I do heroin"  . Sexual activity: Not Currently    Birth control/protection: None  Lifestyle  . Physical activity:    Days per week: Not on file    Minutes per session: Not on  file  . Stress: Not on file  Relationships  . Social connections:    Talks on phone: Not on file    Gets together: Not on file    Attends religious service: Not on file    Active member of club or organization: Not on file    Attends meetings of clubs or organizations: Not on file    Relationship status: Not on file  Other Topics Concern  . Not on file  Social History Narrative   Lives in [place]   Currently Unemployeed   Additional Social History:                         Sleep: Fair  Appetite:  Fair  Current Medications: Current Facility-Administered Medications  Medication Dose Route Frequency Provider Last Rate Last Dose  . acetaminophen (TYLENOL) tablet 650 mg  650 mg Oral Q6H PRN Kerry Hough, PA-C   650 mg at 07/16/18 0743  . alum & mag hydroxide-simeth (MAALOX/MYLANTA) 200-200-20 MG/5ML suspension 30 mL  30 mL Oral Q4H PRN Donell Sievert E, PA-C      . amLODipine (NORVASC) tablet 5 mg  5 mg Oral Daily Antonieta Pert, MD   5 mg at 07/16/18 0737  . chlordiazePOXIDE (LIBRIUM) capsule 25 mg  25 mg Oral TID PRN Antonieta Pert, MD   25 mg at 07/15/18 1225  . cloNIDine (CATAPRES) tablet 0.1 mg  0.1 mg Oral QID Antonieta Pert, MD   0.1 mg at 07/16/18 6213   Followed by  . cloNIDine (CATAPRES) tablet 0.1 mg  0.1 mg Oral BH-qamhs Jola Babinski Marlane Mingle, MD       Followed by  . [START ON 07/19/2018] cloNIDine (CATAPRES) tablet 0.1 mg  0.1 mg Oral QAC breakfast Antonieta Pert, MD      . dicyclomine (BENTYL) tablet 20 mg  20 mg Oral Q6H PRN Antonieta Pert, MD   20 mg at 07/15/18 1957  . folic acid (FOLVITE) tablet 1 mg  1 mg Oral Daily Antonieta Pert, MD   1 mg at 07/16/18 0865  . gabapentin (NEURONTIN) capsule 300 mg  300 mg Oral TID Antonieta Pert, MD   300 mg at 07/16/18 0738  . hydrOXYzine (ATARAX/VISTARIL) tablet 25 mg  25 mg Oral Q6H PRN Kerry Hough, PA-C   25 mg at 07/15/18 1957  . magnesium hydroxide (MILK OF MAGNESIA) suspension  30 mL  30 mL Oral Daily PRN Donell Sievert E, PA-C      . methocarbamol (ROBAXIN) tablet 500 mg  500 mg Oral Q8H PRN Antonieta Pert, MD   500 mg at 07/15/18 1957  . naproxen (  NAPROSYN) tablet 500 mg  500 mg Oral BID PRN Antonieta Pert, MD   500 mg at 07/15/18 1224  . nicotine (NICODERM CQ - dosed in mg/24 hours) patch 21 mg  21 mg Transdermal Daily Antonieta Pert, MD   21 mg at 07/16/18 0736  . pantoprazole (PROTONIX) EC tablet 40 mg  40 mg Oral Daily Antonieta Pert, MD   40 mg at 07/16/18 0737  . risperiDONE (RISPERDAL) tablet 1 mg  1 mg Oral BID Antonieta Pert, MD   1 mg at 07/16/18 0737  . thiamine (VITAMIN B-1) tablet 100 mg  100 mg Oral Daily Antonieta Pert, MD   100 mg at 07/16/18 0737  . traZODone (DESYREL) tablet 50 mg  50 mg Oral QHS,MR X 1 Kerry Hough, PA-C   50 mg at 07/15/18 2114    Lab Results: No results found for this or any previous visit (from the past 48 hour(s)).  Blood Alcohol level:  Lab Results  Component Value Date   ETH 45 (H) 07/14/2018   ETH <10 06/21/2018    Metabolic Disorder Labs: Lab Results  Component Value Date   HGBA1C 5.8 (H) 06/22/2018   MPG 120 06/22/2018   MPG 116.89 02/11/2018   No results found for: PROLACTIN Lab Results  Component Value Date   CHOL 181 06/22/2018   TRIG 216 (H) 06/22/2018   HDL 55 06/22/2018   CHOLHDL 3.3 06/22/2018   VLDL 43 (H) 06/22/2018   LDLCALC 83 06/22/2018   LDLCALC 73 03/07/2015    Physical Findings: AIMS: Facial and Oral Movements Muscles of Facial Expression: None, normal Lips and Perioral Area: None, normal Jaw: None, normal Tongue: None, normal,Extremity Movements Upper (arms, wrists, hands, fingers): None, normal Lower (legs, knees, ankles, toes): None, normal, Trunk Movements Neck, shoulders, hips: None, normal, Overall Severity Severity of abnormal movements (highest score from questions above): None, normal Incapacitation due to abnormal movements: None,  normal Patient's awareness of abnormal movements (rate only patient's report): No Awareness, Dental Status Current problems with teeth and/or dentures?: No Does patient usually wear dentures?: No  CIWA:  CIWA-Ar Total: 10 COWS:  COWS Total Score: 2  Musculoskeletal: Strength & Muscle Tone: within normal limits Gait & Station: normal Patient leans: N/A  Psychiatric Specialty Exam: Physical Exam  Nursing note and vitals reviewed. Constitutional: She is oriented to person, place, and time. She appears well-developed and well-nourished.  HENT:  Head: Normocephalic and atraumatic.  Respiratory: Effort normal.  Neurological: She is alert and oriented to person, place, and time.    ROS  Blood pressure (!) 126/91, pulse 76, temperature 97.8 F (36.6 C), temperature source Oral, resp. rate 20, height 5\' 3"  (1.6 m), weight 51.5 kg, last menstrual period 12/06/2015, SpO2 99 %.Body mass index is 20.11 kg/m.  General Appearance: Disheveled  Eye Contact:  Fair  Speech:  Normal Rate  Volume:  Decreased  Mood:  Dysphoric  Affect:  Congruent  Thought Process:  Coherent and Descriptions of Associations: Intact  Orientation:  Full (Time, Place, and Person)  Thought Content:  Logical  Suicidal Thoughts:  No  Homicidal Thoughts:  No  Memory:  Immediate;   Fair Recent;   Fair Remote;   Fair  Judgement:  Impaired  Insight:  Fair  Psychomotor Activity:  Psychomotor Retardation  Concentration:  Concentration: Fair and Attention Span: Fair  Recall:  [employer] of Knowledge:  Fair  Language:  Fair  Akathisia:  Negative  Handed:  Right  AIMS (if indicated):     Assets:  Desire for Improvement Physical Health Resilience  ADL's:  Intact  Cognition:  WNL  Sleep:  Number of Hours: 5.75     Treatment Plan Summary: Daily contact with patient to assess and evaluate symptoms and progress in treatment, Medication management and Plan : Patient is seen and examined.  Patient is a 52 year old  female with a past psychiatric history significant for polysubstance dependence and bipolar disorder.  She is seen in follow-up.  #1 polysubstance dependence-continued withdrawal from her current substances.  She continues to have pain from issues prior to admission as well as her withdrawal syndrome.  Continue clonidine detox protocol including dicyclomine, methocarbamol and trazodone.  #2 bipolar disorder-I had already restarted her risperidone, and this will be continued.  I am going to re-add her Depakote at bedtime.  We will start with 500 mg p.o. nightly.  #3 history of prolonged QTC-we will get an accurate EKG today.  #4 disposition-disposition planning in progress, patient hopes to get involved with a residential substance abuse treatment program.  Antonieta Pert, MD 07/16/2018, 11:11 AM

## 2018-07-16 NOTE — Plan of Care (Signed)
  Problem: Safety: Goal: Periods of time without injury will increase Outcome: Progressing Note:  Pt has not harmed self or others throughout the shift.  She denies SI/HI and verbally contracts for safety.

## 2018-07-16 NOTE — [facility] Group Notes (Signed)
[facility] Group Notes: (Clinical Social Work)   07/16/2018      Type of Therapy:  Group Therapy   Participation Level:  Did Not Attend despite MHT prompting   Genesis Paget Grossman-Orr, LCSW 07/16/2018, 1:18 PM     

## 2018-07-17 DIAGNOSIS — F419 Anxiety disorder, unspecified: Secondary | ICD-10-CM

## 2018-07-17 DIAGNOSIS — F314 Bipolar disorder, current episode depressed, severe, without psychotic features: Principal | ICD-10-CM

## 2018-07-17 DIAGNOSIS — G4700 Insomnia, unspecified: Secondary | ICD-10-CM

## 2018-07-17 MED ORDER — GABAPENTIN 300 MG PO CAPS
300.0000 mg | ORAL_CAPSULE | Freq: Four times a day (QID) | ORAL | Status: DC
  Administered 2018-07-17 – 2018-07-18 (×7): 300 mg via ORAL
  Filled 2018-07-17: qty 42
  Filled 2018-07-17 (×2): qty 1
  Filled 2018-07-17 (×3): qty 42
  Filled 2018-07-17: qty 1
  Filled 2018-07-17: qty 42
  Filled 2018-07-17: qty 1
  Filled 2018-07-17: qty 42
  Filled 2018-07-17: qty 1
  Filled 2018-07-17: qty 42
  Filled 2018-07-17 (×3): qty 1
  Filled 2018-07-17: qty 42

## 2018-07-17 NOTE — Progress Notes (Signed)
Pt observed in the dayroom, seen interacting with peers. Pt appears flat/anxious in affect and mood. Pt denies SI/HI/AVH at this time. Rates pain 8/10; Generalized. Pt requested to have all her PRNs at bedtime. No new c/o's. Support and encouragement provided. Will continue with POC.

## 2018-07-17 NOTE — Progress Notes (Addendum)
D Pt is observed OOB AUL on the 300 hall today. She is showered, dressed in clean clothes, her ahir is combed and she went to breakfast with he peers this am. She makes good eye contact with Clinical research associate. She smiles and thanks Clinical research associate. She takes her scheduled emds as planned.     A She completed her daily assessment and on this she wrote she deneid SI and she rated her depression, hopelessness and anxioety " 9/9/9", respectively. Her BP is down to 104/74 at lunch ( she was 149/115 at 0600 today). She attended her groups, is talking more positively and is working on Statistician. Writer offered her encouragement. .     R Safety in place

## 2018-07-17 NOTE — [facility] Group Notes (Signed)
Adult Psychoeducational Group Note  Date:  07/17/2018 Time:  9:03 AM  Group Topic/Focus:  Goals Group:   The focus of this group is to help patients establish daily goals to achieve during treatment and discuss how the patient can incorporate goal setting into their daily lives to aide in recovery.  Participation Level:  Active  Participation Quality:  Appropriate  Affect:  Appropriate  Cognitive:  Alert  Insight: Appropriate  Engagement in Group:  Engaged  Modes of Intervention:  Orientation  Additional Comments:  Pt attend and participated in orientation/goals group. Pt goal for today is to feel better than she did yesterday.  Dellia Nims 07/17/2018, 9:03 AM

## 2018-07-17 NOTE — Plan of Care (Signed)
  Problem: Education: Goal: Knowledge of [facility] Education information/materials will improve Outcome: Progressing   

## 2018-07-17 NOTE — [facility] Group Notes (Signed)
[facility] Group Notes:  (Nursing/MHT/Case Management/Adjunct)  Date:  07/17/2018  Time:  5:54 PM  Type of Therapy:  Nurse Education  Participation Level:  Active  Participation Quality:  Appropriate and Attentive  Affect:  Appropriate  Cognitive:  Appropriate  Insight:  Good and Improving  Engagement in Group:  Engaged and Improving  Modes of Intervention:  Discussion and Education  Summary of Progress/Problems: Today the RN discussed healthy coping skills and positive self-image. The patient was active and engaged throughout.  Kirstie Mirza 07/17/2018, 5:54 PM

## 2018-07-17 NOTE — Progress Notes (Signed)
Patient only attended the first 15 minutes evening speaker AA meeting. Pt left group and went to her room.

## 2018-07-17 NOTE — Progress Notes (Signed)
[facility] MD Progress Note  07/17/2018 9:59 AM Ariana Jensen  MRN:  [id number]   Subjective: Patient reports today that she feels she is not improving very well.  She reports no suicidal or homicidal ideations and denies any hallucinations today.  She reports that she did have some suicidal thoughts yesterday, but that was just due to her situation and concern up where she will go to when she is discharged from the hospital.  She reports that she wants to go to a residential treatment program but remembers from her last visit that the court dates will interfere.  She reports that she has a court date on January 29 and another one on November 14.  She states that she plans to call the court system on Monday to find out if he could be postponed so that she can go to treatment.  She does state however that if this cannot be done or she cannot get a hold of the correct person that she is willing to go to [facility] for treatment.  She reports having some minor withdrawal symptoms.  She stated that she actually slept very well last night.  She reports having a fair appetite as well.  Objective: Patient's chart and findings reviewed and discussed with treatment team.  Patient presents in the day room feeling out her personal assessment sheet.  She is pleasant, calm, and cooperative.  Her mood seems to be improved today prior to previous reports.  She is euthymic and has a congruent affect.  She is pleased to know that we will be helping her look for a residential treatment program.  She has been seen interacting with peers and staff appropriately.  There is been reported that she has attended some groups.  Principal Problem: Severe bipolar I disorder, most recent episode depressed (HCC) Diagnosis:   Patient Active Problem List   Diagnosis Date Noted  . MDD (major depressive disorder), recurrent episode, severe (HCC) [F33.2] 07/14/2018  . Alcohol withdrawal syndrome with complication (HCC) [F10.239]    . Polysubstance dependence (HCC) [F19.20]   . Severe bipolar I disorder, most recent episode depressed (HCC) [F31.4]   . PTSD (post-traumatic stress disorder) [F43.10] 06/22/2018  . Bipolar I disorder, most recent episode depressed, severe without psychotic features (HCC) [F31.4] 06/21/2018  . PVC's (premature ventricular contractions) [I49.3] 03/04/2018  . OD (overdose of drug) [T50.901A] 03/04/2018  . Leukocytosis [D72.829] 03/04/2018  . Hypokalemia [E87.6] 03/04/2018  . Acute drug intoxication with complication (HCC) [F19.929]   . Acute urinary retention [R33.8] 02/11/2018  . Acute renal failure (ARF) (HCC) [N17.9] 02/10/2018  . Closed fracture of left distal fibula [S82.832A] 02/10/2018  . Withdrawal complaint [R68.89] 05/12/2017  . Symptomatic bradycardia [R00.1] 05/12/2017  . Acute kidney injury (HCC) [N17.9] 08/18/2016  . Boils [L02.92] 08/18/2016  . Rhabdomyolysis [M62.82] 08/18/2016  . Urinary retention [R33.9] 08/18/2016  . ARF (acute renal failure) (HCC) [N17.9] 08/18/2016  . AKI (acute kidney injury) (HCC) [N17.9] 08/17/2016  . Hallucination [R44.3] 08/17/2016  . Nausea & vomiting [R11.2] 10/04/2015  . Pruritic dermatitis [L29.9] 09/10/2015  . Rash and nonspecific skin eruption [R21] 08/21/2015  . Body lice [B85.1] 08/05/2015  . Prolonged QT interval [R94.31] 07/18/2015  . Dysmenorrhea [N94.6] 04/10/2015  . Trichimoniasis [A59.9] 04/10/2015  . Chest pain [R07.9] 03/07/2015  . Healthcare maintenance [Z00.00] 03/07/2015  . RLQ abdominal pain [R10.31] 02/18/2012  . GERD (gastroesophageal reflux disease) [K21.9] 01/28/2012  . Menorrhagia [N92.0] 01/25/2012  . Weakness generalized [R53.1] 12/05/2011  . Tobacco abuse [  Z72.0] 11/06/2011  . HTN (hypertension) [I10] 10/22/2011  . Abscess and cellulitis [L03.90, L02.91] 10/22/2011  . Hepatitis C, chronic (HCC) [B18.2] 10/22/2011  . History of TB (tuberculosis) [Z86.11] 10/22/2011  . Opioid use disorder, severe, dependence  (HCC) [F11.20]   . Polysubstance abuse (HCC) [F19.10] 04/17/2011   Total Time spent with patient: 30 minutes  Past Psychiatric History: See H&P  Past Medical History:  Past Medical History:  Diagnosis Date  . Anxiety   . ASCUS (atypical squamous cells of undetermined significance) on Pap smear   . Chronic lower back pain   . Cocaine abuse (HCC)    crack cocaine  . Depression   . Fibromyalgia   . GERD (gastroesophageal reflux disease)   . Hepatitis C   . Hepatitis C antibody test positive   . Hypertension   . Hypokalemia 03/04/2018  . Leukocytosis 03/04/2018  . Opioid dependence (HCC)   . PVC's (premature ventricular contractions) 03/04/2018  . Seizures (HCC)    "don't know why; I've had 2 in the past year" (08/18/2016)  . 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  . Trichomonas 04/17/2011   Diagnosed 04/02/11 during hospitalization, treated with Flagyl 2g, patient instructed to have partner treated (must follow-up)   . Tuberculosis    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 age 84, now s/p 1-yr of multidrug treatment (dates unknown)    Past Surgical History:  Procedure Laterality Date  . CESAREAN SECTION  1984  . DILATION AND CURETTAGE OF UTERUS    . I&D EXTREMITY  10/18/2011   Procedure: IRRIGATION AND DEBRIDEMENT EXTREMITY;  Surgeon: Sharma Covert, MD;  Location: [facility] OR;  Service: Orthopedics;  Laterality: Left;  . I&D EXTREMITY  10/21/2011   Procedure: IRRIGATION AND DEBRIDEMENT EXTREMITY;  Surgeon: Sharma Covert, MD;  Location: [facility] OR;  Service: Orthopedics;  Laterality: Left;  . SALPINGECTOMY Left March 2010   ectopic pregnancy   Family History:  Family History  Problem Relation Age of Onset  . Heart attack Mother   . Heart attack Father   . Heart attack Paternal Grandmother   . Heart attack Paternal Grandfather   . Cirrhosis Brother    Family Psychiatric  History: See H&P Social History:  Social History   Substance and Sexual  Activity  Alcohol Use Yes  . Alcohol/week: 0.0 standard drinks   Comment: occ     Social History   Substance and Sexual Activity  Drug Use Yes  . Types: Oxycodone, Heroin, Cocaine, IV   Comment: Pt stated "I do heroin"    Social History   Socioeconomic History  . Marital status: Widowed    Spouse name: Not on file  . Number of children: 1  . Years of education: 7th grade  . Highest education level: Not on file  Occupational History  . Not on file  Social Needs  . Financial resource strain: Not on file  . Food insecurity:    Worry: Not on file    Inability: Not on file  . Transportation needs:    Medical: Not on file    Non-medical: Not on file  Tobacco Use  . Smoking status: Current Every Day Smoker    Packs/day: 0.50    Years: 32.00    Pack years: 16.00    Types: Cigarettes  . Smokeless tobacco: Never Used  Substance and Sexual Activity  . Alcohol use: Yes    Alcohol/week: 0.0 standard drinks    Comment: occ  . Drug use:  Yes    Types: Oxycodone, Heroin, Cocaine, IV    Comment: Pt stated "I do heroin"  . Sexual activity: Not Currently    Birth control/protection: None  Lifestyle  . Physical activity:    Days per week: Not on file    Minutes per session: Not on file  . Stress: Not on file  Relationships  . Social connections:    Talks on phone: Not on file    Gets together: Not on file    Attends religious service: Not on file    Active member of club or organization: Not on file    Attends meetings of clubs or organizations: Not on file    Relationship status: Not on file  Other Topics Concern  . Not on file  Social History Narrative   Lives in [place]   Currently Unemployeed   Additional Social History:                         Sleep: Good  Appetite:  Fair  Current Medications: Current Facility-Administered Medications  Medication Dose Route Frequency Provider Last Rate Last Dose  . acetaminophen (TYLENOL) tablet 650 mg  650 mg  Oral Q6H PRN Kerry Hough, PA-C   650 mg at 07/16/18 0743  . alum & mag hydroxide-simeth (MAALOX/MYLANTA) 200-200-20 MG/5ML suspension 30 mL  30 mL Oral Q4H PRN Donell Sievert E, PA-C      . amLODipine (NORVASC) tablet 5 mg  5 mg Oral Daily Antonieta Pert, MD   5 mg at 07/17/18 1610  . chlordiazePOXIDE (LIBRIUM) capsule 25 mg  25 mg Oral TID PRN Antonieta Pert, MD   25 mg at 07/16/18 2134  . cloNIDine (CATAPRES) tablet 0.1 mg  0.1 mg Oral BH-qamhs Antonieta Pert, MD   0.1 mg at 07/17/18 9604   Followed by  . [START ON 07/19/2018] cloNIDine (CATAPRES) tablet 0.1 mg  0.1 mg Oral QAC breakfast Antonieta Pert, MD      . dicyclomine (BENTYL) tablet 20 mg  20 mg Oral Q6H PRN Antonieta Pert, MD   20 mg at 07/15/18 1957  . divalproex (DEPAKOTE ER) 24 hr tablet 500 mg  500 mg Oral QPM Antonieta Pert, MD   500 mg at 07/16/18 2135  . folic acid (FOLVITE) tablet 1 mg  1 mg Oral Daily Antonieta Pert, MD   1 mg at 07/17/18 0912  . gabapentin (NEURONTIN) capsule 300 mg  300 mg Oral QID Money, Gerlene Burdock, FNP      . hydrOXYzine (ATARAX/VISTARIL) tablet 25 mg  25 mg Oral Q6H PRN Kerry Hough, PA-C   25 mg at 07/15/18 1957  . magnesium hydroxide (MILK OF MAGNESIA) suspension 30 mL  30 mL Oral Daily PRN Donell Sievert E, PA-C      . methocarbamol (ROBAXIN) tablet 500 mg  500 mg Oral Q8H PRN Antonieta Pert, MD   500 mg at 07/17/18 0912  . naproxen (NAPROSYN) tablet 500 mg  500 mg Oral BID PRN Antonieta Pert, MD   500 mg at 07/16/18 1613  . nicotine (NICODERM CQ - dosed in mg/24 hours) patch 21 mg  21 mg Transdermal Daily Antonieta Pert, MD   21 mg at 07/17/18 0935  . pantoprazole (PROTONIX) EC tablet 40 mg  40 mg Oral Daily Antonieta Pert, MD   40 mg at 07/17/18 0912  . risperiDONE (RISPERDAL) tablet 1 mg  1 mg Oral  BID Antonieta Pert, MD   1 mg at 07/17/18 7829  . thiamine (VITAMIN B-1) tablet 100 mg  100 mg Oral Daily Antonieta Pert, MD   100 mg at 07/17/18  0912  . traZODone (DESYREL) tablet 50 mg  50 mg Oral QHS,MR X 1 Kerry Hough, PA-C   50 mg at 07/16/18 2134    Lab Results: No results found for this or any previous visit (from the past 48 hour(s)).  Blood Alcohol level:  Lab Results  Component Value Date   ETH 45 (H) 07/14/2018   ETH <10 06/21/2018    Metabolic Disorder Labs: Lab Results  Component Value Date   HGBA1C 5.8 (H) 06/22/2018   MPG 120 06/22/2018   MPG 116.89 02/11/2018   No results found for: PROLACTIN Lab Results  Component Value Date   CHOL 181 06/22/2018   TRIG 216 (H) 06/22/2018   HDL 55 06/22/2018   CHOLHDL 3.3 06/22/2018   VLDL 43 (H) 06/22/2018   LDLCALC 83 06/22/2018   LDLCALC 73 03/07/2015    Physical Findings: AIMS: Facial and Oral Movements Muscles of Facial Expression: None, normal Lips and Perioral Area: None, normal Jaw: None, normal Tongue: None, normal,Extremity Movements Upper (arms, wrists, hands, fingers): None, normal Lower (legs, knees, ankles, toes): None, normal, Trunk Movements Neck, shoulders, hips: None, normal, Overall Severity Severity of abnormal movements (highest score from questions above): None, normal Incapacitation due to abnormal movements: None, normal Patient's awareness of abnormal movements (rate only patient's report): No Awareness, Dental Status Current problems with teeth and/or dentures?: No Does patient usually wear dentures?: No  CIWA:  CIWA-Ar Total: 10 COWS:  COWS Total Score: 0  Musculoskeletal: Strength & Muscle Tone: within normal limits Gait & Station: normal Patient leans: N/A  Psychiatric Specialty Exam: Physical Exam  Nursing note and vitals reviewed. Constitutional: She is oriented to person, place, and time. She appears well-developed and well-nourished.  Cardiovascular: Normal rate.  Respiratory: Effort normal.  Musculoskeletal: Normal range of motion.  Neurological: She is alert and oriented to person, place, and time.  Skin:  Skin is warm.    Review of Systems  Constitutional: Negative.   HENT: Negative.   Eyes: Negative.   Respiratory: Negative.   Cardiovascular: Negative.   Gastrointestinal: Negative.   Genitourinary: Negative.   Musculoskeletal: Negative.   Skin: Negative.   Neurological: Negative.   Endo/Heme/Allergies: Negative.   Psychiatric/Behavioral: Positive for depression and substance abuse. Negative for hallucinations and suicidal ideas.    Blood pressure (!) 151/99, pulse 76, temperature 97.8 F (36.6 C), temperature source Oral, resp. rate 20, height 5\' 3"  (1.6 m), weight 51.5 kg, last menstrual period 12/06/2015, SpO2 99 %.Body mass index is 20.11 kg/m.  General Appearance: Disheveled  Eye Contact:  Good  Speech:  Clear and Coherent and Normal Rate  Volume:  Normal  Mood:  Depressed  Affect:  Congruent  Thought Process:  Linear and Descriptions of Associations: Intact  Orientation:  Full (Time, Place, and Person)  Thought Content:  WDL  Suicidal Thoughts:  No  Homicidal Thoughts:  No  Memory:  Immediate;   Good Recent;   Good Remote;   Good  Judgement:  Fair  Insight:  Fair  Psychomotor Activity:  Normal  Concentration:  Concentration: Good and Attention Span: Good  Recall:  Good  Fund of Knowledge:  Good  Language:  Good  Akathisia:  No  Handed:  Right  AIMS (if indicated):     Assets:  Communication  Skills Desire for Improvement Physical Health Social Support Transportation  ADL's:  Intact  Cognition:  WNL  Sleep:  Number of Hours: 6.75   Problems addressed Severe bipolar 1 disorder most recent episode depressed Polysubstance dependence Alcohol withdrawal symptoms with complication  Treatment Plan Summary: Daily contact with patient to assess and evaluate symptoms and progress in treatment, Medication management and Plan is to: Continue trazodone 50 mg p.o. nightly as needed for insomnia Continue Risperdal 1 mg p.o. twice daily for mood stability Continue  Vistaril 25 mg p.o. every 6 hours as needed for anxiety Increase gabapentin 300 mg 4 times a day for withdrawal symptoms and mood stability Continue Depakote ER 500 mg p.o. daily for mood stability Continue clonidine detox protocol Continue Librium as needed detox protocol Encourage group therapy participation Inform CSW about residential treatment request  Maryfrances Bunnell, FNP 07/17/2018, 9:59 AM

## 2018-07-17 NOTE — [facility] Group Notes (Signed)
[facility] LCSW Group Therapy Note  07/17/2018  10:00-11:00AM  Type of Therapy and Topic:  Group Therapy:  Adding Supports Including Being Your Own Support  Participation Level:  Minimal   Description of Group:  Patients in this group were introduced to the concept that additional supports including self-support are an essential part of recovery.  A song entitled "I Need Help!" was played and a group discussion was held in reaction to the idea of needing to add supports.  A song entitled "My Own Hero" was played and a group discussion ensued in which patients stated they could relate to the song and it inspired them to realize they have be willing to help themselves in order to succeed, because other people cannot achieve sobriety or stability for them.  We discussed adding a variety of healthy supports to address the various needs in their lives.  A song was played called "I Know Where I've Been" toward the end of group and used to conduct an inspirational wrap-up to group of remembering how far they have already come in their journey.  Therapeutic Goals: 1)  demonstrate the importance of being a part of one's own support system 2)  discuss reasons people in one's life may eventually be unable to be continually supportive  3)  identify the patient's current support system and   4)  elicit commitments to add healthy supports and to become more conscious of being self-supportive   Summary of Patient Progress:  The patient expressed that she has a couple of good friends who are healthy supports, but most of her friends are unhealthy for her.  She did state that she feels she needs to participate in her own recovery instead of expecting other people to rescue her.   Therapeutic Modalities:   Motivational Interviewing Activity  Lynnell Chad

## 2018-07-18 LAB — VALPROIC ACID LEVEL
VALPROIC ACID LVL: 27 ug/mL — AB (ref 50.0–100.0)
Valproic Acid Lvl: 37 ug/mL — ABNORMAL LOW (ref 50.0–100.0)

## 2018-07-18 LAB — CBC WITH DIFFERENTIAL/PLATELET
Abs Immature Granulocytes: 0.06 10*3/uL (ref 0.00–0.07)
Basophils Absolute: 0 10*3/uL (ref 0.0–0.1)
Basophils Relative: 0 %
EOS ABS: 0.3 10*3/uL (ref 0.0–0.5)
Eosinophils Relative: 5 %
HEMATOCRIT: 32.3 % — AB (ref 36.0–46.0)
Hemoglobin: 10.1 g/dL — ABNORMAL LOW (ref 12.0–15.0)
Immature Granulocytes: 1 %
LYMPHS ABS: 1.5 10*3/uL (ref 0.7–4.0)
Lymphocytes Relative: 25 %
MCH: 29.1 pg (ref 26.0–34.0)
MCHC: 31.3 g/dL (ref 30.0–36.0)
MCV: 93.1 fL (ref 80.0–100.0)
MONO ABS: 0.4 10*3/uL (ref 0.1–1.0)
MONOS PCT: 7 %
NRBC: 0 % (ref 0.0–0.2)
Neutro Abs: 3.8 10*3/uL (ref 1.7–7.7)
Neutrophils Relative %: 62 %
Platelets: 190 10*3/uL (ref 150–400)
RBC: 3.47 MIL/uL — ABNORMAL LOW (ref 3.87–5.11)
RDW: 16.5 % — AB (ref 11.5–15.5)
WBC: 6.1 10*3/uL (ref 4.0–10.5)

## 2018-07-18 LAB — HEPATIC FUNCTION PANEL
ALT: 27 U/L (ref 0–44)
AST: 25 U/L (ref 15–41)
Albumin: 3.1 g/dL — ABNORMAL LOW (ref 3.5–5.0)
Alkaline Phosphatase: 50 U/L (ref 38–126)
BILIRUBIN TOTAL: 0.5 mg/dL (ref 0.3–1.2)
Bilirubin, Direct: 0.1 mg/dL (ref 0.0–0.2)
Indirect Bilirubin: 0.4 mg/dL (ref 0.3–0.9)
Total Protein: 6 g/dL — ABNORMAL LOW (ref 6.5–8.1)

## 2018-07-18 MED ORDER — AMLODIPINE BESYLATE 10 MG PO TABS
10.0000 mg | ORAL_TABLET | Freq: Every day | ORAL | Status: DC
  Filled 2018-07-18: qty 1
  Filled 2018-07-18 (×2): qty 14

## 2018-07-18 MED ORDER — RISPERIDONE 1 MG PO TABS: 1.0000 mg | ORAL_TABLET | Freq: Two times a day (BID) | ORAL | 0 refills | Status: DC

## 2018-07-18 MED ORDER — AMLODIPINE BESYLATE 5 MG PO TABS: 5.0000 mg | ORAL_TABLET | Freq: Every day | ORAL | 0 refills | Status: DC

## 2018-07-18 MED ORDER — TRAZODONE HCL 50 MG PO TABS: 50.0000 mg | ORAL_TABLET | Freq: Every evening | ORAL | 0 refills | Status: DC | PRN

## 2018-07-18 MED ORDER — GABAPENTIN 300 MG PO CAPS: 300.0000 mg | ORAL_CAPSULE | Freq: Four times a day (QID) | ORAL | 0 refills | Status: DC

## 2018-07-18 MED ORDER — NICOTINE 21 MG/24HR TD PT24: 21.0000 mg | MEDICATED_PATCH | Freq: Every day | TRANSDERMAL | 0 refills | Status: DC

## 2018-07-18 MED ORDER — AMLODIPINE BESYLATE 5 MG PO TABS
5.0000 mg | ORAL_TABLET | Freq: Once | ORAL | Status: AC
  Administered 2018-07-18: 5 mg via ORAL
  Filled 2018-07-18: qty 1

## 2018-07-18 MED ORDER — DIVALPROEX SODIUM ER 500 MG PO TB24: 500.0000 mg | ORAL_TABLET | Freq: Every evening | ORAL | 0 refills | Status: DC

## 2018-07-18 MED ORDER — CLONIDINE HCL 0.1 MG PO TABS: 0.1000 mg | ORAL_TABLET | Freq: Every day | ORAL | 0 refills | Status: DC

## 2018-07-18 MED ORDER — PANTOPRAZOLE SODIUM 20 MG PO TBEC
20.0000 mg | DELAYED_RELEASE_TABLET | Freq: Every day | ORAL | Status: DC
  Filled 2018-07-18 (×2): qty 14

## 2018-07-18 MED ORDER — AMLODIPINE BESYLATE 10 MG PO TABS: 10.0000 mg | ORAL_TABLET | Freq: Every day | ORAL | 0 refills | Status: DC

## 2018-07-18 MED ORDER — HYDROXYZINE HCL 25 MG PO TABS: 25.0000 mg | ORAL_TABLET | Freq: Four times a day (QID) | ORAL | 0 refills | Status: DC | PRN

## 2018-07-18 NOTE — Progress Notes (Addendum)
Discharge- Patient verbalizes readiness for discharge: Denies SI/HI, is not psychotic or delusional. A- Discharge instruction read and discussed with patient. All belongings returned to patient.  R- Patient verbalize understanding of discharge instructions. Signed for return of belongings.Meal tray given. Escorted to the lobby and waited with Pt till arrival of taxi.

## 2018-07-18 NOTE — Progress Notes (Signed)
Patient did not attend the evening speaker AA meeting. Pt was notified that group was beginning but returned to her room.   

## 2018-07-18 NOTE — Progress Notes (Signed)
Pt observed in the dayroom, seen interacting with peers. Pt appears animated/anxious in affect and mood. Pt denies SI/HI/AVH at this time. Rates pain 8/10; Generalized. Pt requested to have all her PRNs at bedtime. No new c/o's. Support and encouragement provided. Will continue with POC.

## 2018-07-18 NOTE — Progress Notes (Signed)
Nursing Progress Note [phone]  Data: Patient presents with anxiety. Patient complaint with scheduled medications. Patient reports chronic back pain and leg pain and requested PRN pain relief. Patient completed self-inventory sheet and rates depression, hopelessness, and anxiety 9, 9, and 9 respectively. Patient rates their sleep and appetite as fair and good respectively. Patient states goal for today is to "feel better". Patient slept in but is otherwise seen attending groups and visible in the milieu. Patient currently denies SI/HI/AVH.   Action: Patient is educated about and provided medication per provider's orders. Patient safety maintained with q15 min safety checks and frequent rounding. Low fall risk precautions in place. Emotional support offered. 1:1 interaction and active listening provided. Patient encouraged to attend meals, groups, and work on treatment plan and goals. Labs, vital signs and patient behavior monitored throughout shift.   Response: Patient remains safe on the unit at this time and agrees to come to staff with any issues/concerns. Patient is interacting with peers appropriately on the unit. Will continue to support and monitor.

## 2018-07-18 NOTE — Progress Notes (Signed)
Recreation Therapy Notes  Date: 10.28.19 Time: 0930 Location: [address] Dayroom  Group Topic: Stress Management  Goal Area(s) Addresses:  Patient will verbalize importance of using healthy stress management.  Patient will identify positive emotions associated with healthy stress management.   Behavioral Response: Engaged  Intervention: Stress Management  Activity :  Meditation.  LRT introduced the stress management technique of meditation.  LRT played a meditation on being resilient.  Patients were to follow along as meditation played to engage in activity.  Education:  Stress Management, Discharge Planning.   Education Outcome: Acknowledges edcuation/In group clarification offered/Needs additional education  Clinical Observations/Feedback: Pt attended and participated in activity.    Caroll Rancher, LRT/CTRS         Caroll Rancher A 07/18/2018 11:52 AM

## 2018-07-18 NOTE — Plan of Care (Signed)
  Problem: Health Behavior/Discharge Planning: Goal: Compliance with treatment plan for underlying cause of condition will improve Outcome: Progressing Note:  Pt. Compliant with treatment and med regimen.

## 2018-07-18 NOTE — Discharge Summary (Signed)
Physician Discharge Summary Note  Patient:  Ariana Jensen is an 52 y.o., female MRN:  [id number] DOB:  1965/10/21 Patient phone:  [phone] (home)  Patient address:   [address] [place] [address],  Total Time spent with patient: 20 minutes  Date of Admission:  07/14/2018 Date of Discharge: 07/18/2018  Reason for Admission:  Per assessment note on admission: Patient is a 52 year old female with a past psychiatric history significant for polysubstance dependence and bipolar disorder who presented to the [facility] emergency department by [facility]. This was secondary to ongoing substance abuse with heroin and alcohol. The patient had a recent hospitalization at our facility for similar circumstances on 06/21/2018. Patient stated she is currently homeless. After this last hospitalization she returned to the home where she was staying with her reported boyfriend. Early after she returned to that domicile she was "kidnapped" by her boyfriend. According to the patient he is currently in jail, and after she had him arrested she had to leave the house. The house belongs to the parents of her significant other. She stated approximately 10 to 12 days after her admission she relapsed on substances again. The patient related that she had attempted to kill herself on 2 previous occasions, the most recent being 18 months ago via an overdose. She has been hospitalized for substance related issues for approximately 4-5 times in the last year and a half. She was discharged on Depakote, risk for doll, clonidine, Vistaril, trazodone, Protonix. She admitted to drinking at least a 12 pack a day, and was unable to quantitate the amount of opiates she had been using. She stated she is still been using heroin. She admitted to helplessness, hopelessness and worthlessness. She admitted to suicidal ideation. She was admitted to the hospital for evaluation and  stabilization.  Principal Problem: Severe bipolar I disorder, most recent episode depressed [facility]) Discharge Diagnoses: Patient Active Problem List   Diagnosis Date Noted  . MDD (major depressive disorder), recurrent episode, severe (HCC) [F33.2] 07/14/2018  . Alcohol withdrawal syndrome with complication (HCC) [F10.239]   . Polysubstance dependence (HCC) [F19.20]   . Severe bipolar I disorder, most recent episode depressed (HCC) [F31.4]   . PTSD (post-traumatic stress disorder) [F43.10] 06/22/2018  . Bipolar I disorder, most recent episode depressed, severe without psychotic features (HCC) [F31.4] 06/21/2018  . PVC's (premature ventricular contractions) [I49.3] 03/04/2018  . OD (overdose of drug) [T50.901A] 03/04/2018  . Leukocytosis [D72.829] 03/04/2018  . Hypokalemia [E87.6] 03/04/2018  . Acute drug intoxication with complication (HCC) [F19.929]   . Acute urinary retention [R33.8] 02/11/2018  . Acute renal failure (ARF) (HCC) [N17.9] 02/10/2018  . Closed fracture of left distal fibula [S82.832A] 02/10/2018  . Withdrawal complaint [R68.89] 05/12/2017  . Symptomatic bradycardia [R00.1] 05/12/2017  . Acute kidney injury (HCC) [N17.9] 08/18/2016  . Boils [L02.92] 08/18/2016  . Rhabdomyolysis [M62.82] 08/18/2016  . Urinary retention [R33.9] 08/18/2016  . ARF (acute renal failure) (HCC) [N17.9] 08/18/2016  . AKI (acute kidney injury) (HCC) [N17.9] 08/17/2016  . Hallucination [R44.3] 08/17/2016  . Nausea & vomiting [R11.2] 10/04/2015  . Pruritic dermatitis [L29.9] 09/10/2015  . Rash and nonspecific skin eruption [R21] 08/21/2015  . Body lice [B85.1] 08/05/2015  . Prolonged QT interval [R94.31] 07/18/2015  . Dysmenorrhea [N94.6] 04/10/2015  . Trichimoniasis [A59.9] 04/10/2015  . Chest pain [R07.9] 03/07/2015  . Healthcare maintenance [Z00.00] 03/07/2015  . RLQ abdominal pain [R10.31] 02/18/2012  . GERD (gastroesophageal reflux disease) [K21.9] 01/28/2012  . Menorrhagia [N92.0]  01/25/2012  .  Weakness generalized [R53.1] 12/05/2011  . Tobacco abuse [Z72.0] 11/06/2011  . HTN (hypertension) [I10] 10/22/2011  . Abscess and cellulitis [L03.90, L02.91] 10/22/2011  . Hepatitis C, chronic (HCC) [B18.2] 10/22/2011  . History of TB (tuberculosis) [Z86.11] 10/22/2011  . Opioid use disorder, severe, dependence (HCC) [F11.20]   . Polysubstance abuse [facility]) [F19.10] 04/17/2011    Past Psychiatric History:   Past Medical History:  Past Medical History:  Diagnosis Date  . Anxiety   . ASCUS (atypical squamous cells of undetermined significance) on Pap smear   . Chronic lower back pain   . Cocaine abuse (HCC)    crack cocaine  . Depression   . Fibromyalgia   . GERD (gastroesophageal reflux disease)   . Hepatitis C   . Hepatitis C antibody test positive   . Hypertension   . Hypokalemia 03/04/2018  . Leukocytosis 03/04/2018  . Opioid dependence (HCC)   . PVC's (premature ventricular contractions) 03/04/2018  . Seizures (HCC)    "don't know why; I've had 2 in the past year" (08/18/2016)  . 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  . Trichomonas 04/17/2011   Diagnosed 04/02/11 during hospitalization, treated with Flagyl 2g, patient instructed to have partner treated (must follow-up)   . Tuberculosis    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 age 52, now s/p 1-yr of multidrug treatment (dates unknown)    Past Surgical History:  Procedure Laterality Date  . CESAREAN SECTION  1984  . DILATION AND CURETTAGE OF UTERUS    . I&D EXTREMITY  10/18/2011   Procedure: IRRIGATION AND DEBRIDEMENT EXTREMITY;  Surgeon: Sharma Covert, MD;  Location: [facility] OR;  Service: Orthopedics;  Laterality: Left;  . I&D EXTREMITY  10/21/2011   Procedure: IRRIGATION AND DEBRIDEMENT EXTREMITY;  Surgeon: Sharma Covert, MD;  Location: [facility] OR;  Service: Orthopedics;  Laterality: Left;  . SALPINGECTOMY Left March 2010   ectopic pregnancy   Family History:  Family History   Problem Relation Age of Onset  . Heart attack Mother   . Heart attack Father   . Heart attack Paternal Grandmother   . Heart attack Paternal Grandfather   . Cirrhosis Brother    Family Psychiatric  History:  Social History:  Social History   Substance and Sexual Activity  Alcohol Use Yes  . Alcohol/week: 0.0 standard drinks   Comment: occ     Social History   Substance and Sexual Activity  Drug Use Yes  . Types: Oxycodone, Heroin, Cocaine, IV   Comment: Pt stated "I do heroin"    Social History   Socioeconomic History  . Marital status: Widowed    Spouse name: Not on file  . Number of children: 1  . Years of education: 7th grade  . Highest education level: Not on file  Occupational History  . Not on file  Social Needs  . Financial resource strain: Not on file  . Food insecurity:    Worry: Not on file    Inability: Not on file  . Transportation needs:    Medical: Not on file    Non-medical: Not on file  Tobacco Use  . Smoking status: Current Every Day Smoker    Packs/day: 0.50    Years: 32.00    Pack years: 16.00    Types: Cigarettes  . Smokeless tobacco: Never Used  Substance and Sexual Activity  . Alcohol use: Yes    Alcohol/week: 0.0 standard drinks    Comment: occ  . Drug use: Yes  Types: Oxycodone, Heroin, Cocaine, IV    Comment: Pt stated "I do heroin"  . Sexual activity: Not Currently    Birth control/protection: None  Lifestyle  . Physical activity:    Days per week: Not on file    Minutes per session: Not on file  . Stress: Not on file  Relationships  . Social connections:    Talks on phone: Not on file    Gets together: Not on file    Attends religious service: Not on file    Active member of club or organization: Not on file    Attends meetings of clubs or organizations: Not on file    Relationship status: Not on file  Other Topics Concern  . Not on file  Social History Narrative   Lives in [place]   Currently [facility] Course:  Alizzon Dioguardi was admitted for Severe bipolar I disorder, most recent episode depressed (HCC) and crisis management.  Pt was treated discharged with the medications listed below under Medication List.  Medical problems were identified and treated as needed.  Home medications were restarted as appropriate.  Improvement was monitored by observation and [name] 's daily report of symptom reduction.  Emotional and mental status was monitored by daily self-inventory reports completed by [name] and clinical staff.         [name] was evaluated by the treatment team for stability and plans for continued recovery upon discharge. [name] 's motivation was an integral factor for scheduling further treatment. Employment, transportation, bed availability, health status, family support, and any pending legal issues were also considered during hospital stay. Pt was offered further treatment options upon discharge including but not limited to Residential, Intensive Outpatient, and Outpatient treatment.  [name] will follow up with the services as listed below under Follow Up Information.     Upon completion of this admission the patient was both mentally and medically stable for discharge denying suicidal/homicidal ideation, auditory/visual/tactile hallucinations, delusional thoughts and paranoia.   Liannah Bendavid responded well to treatment with Depakote 500 mg , Neurontin  300 mg and  Risperdal 1 mg.    without adverse effects. Initially, pt did complain of  with these medications, but this resolved. Pt demonstrated improvement without reported or observed adverse effects to the point of stability appropriate for outpatient management. Pertinent labs include: Valproic  for which outpatient follow-up is necessary for lab recheck as mentioned below. Reviewed CBC, CMP, BAL, and UDS + Opiates and Cocaine ; all unremarkable aside from noted  exceptions.   Physical Findings: AIMS: Facial and Oral Movements Muscles of Facial Expression: None, normal Lips and Perioral Area: None, normal Jaw: None, normal Tongue: None, normal,Extremity Movements Upper (arms, wrists, hands, fingers): None, normal Lower (legs, knees, ankles, toes): None, normal, Trunk Movements Neck, shoulders, hips: None, normal, Overall Severity Severity of abnormal movements (highest score from questions above): None, normal Incapacitation due to abnormal movements: None, normal Patient's awareness of abnormal movements (rate only patient's report): No Awareness, Dental Status Current problems with teeth and/or dentures?: No Does patient usually wear dentures?: No  CIWA:  CIWA-Ar Total: 10 COWS:  COWS Total Score: 2  Musculoskeletal: Strength & Muscle Tone: within normal limits Gait & Station: normal Patient leans: N/A  Psychiatric Specialty Exam: See SRA by MD Physical Exam  Vitals reviewed. Constitutional: She appears well-developed.  Psychiatric: She has a normal mood and affect. Her behavior is normal.    Review of Systems  Psychiatric/Behavioral: Negative for depression (stable). The patient is not nervous/anxious (improving ).   All other systems reviewed and are negative.   Blood pressure (!) 157/108, pulse 85, temperature (!) 97.5 F (36.4 C), resp. rate 20, height 5\' 3"  (1.6 m), weight 51.5 kg, last menstrual period 12/06/2015, SpO2 99 %.Body mass index is 20.11 kg/m.  Have you used any form of tobacco in the last 30 days? (Cigarettes, Smokeless Tobacco, Cigars, and/or Pipes): Yes  Has this patient used any form of tobacco in the last 30 days? (Cigarettes, Smokeless Tobacco, Cigars, and/or Pipes) Yes, Yes, A prescription for an FDA-approved tobacco cessation medication was offered at discharge and the patient refused  Blood Alcohol level:  Lab Results  Component Value Date   ETH 45 (H) 07/14/2018   ETH <10 06/21/2018    Metabolic  Disorder Labs:  Lab Results  Component Value Date   HGBA1C 5.8 (H) 06/22/2018   MPG 120 06/22/2018   MPG 116.89 02/11/2018   No results found for: PROLACTIN Lab Results  Component Value Date   CHOL 181 06/22/2018   TRIG 216 (H) 06/22/2018   HDL 55 06/22/2018   CHOLHDL 3.3 06/22/2018   VLDL 43 (H) 06/22/2018   LDLCALC 83 06/22/2018   LDLCALC 73 03/07/2015    See Psychiatric Specialty Exam and Suicide Risk Assessment completed by Attending Physician prior to discharge.  Discharge destination:  [facility]  Is patient on multiple antipsychotic therapies at discharge:  No   Has Patient had three or more failed trials of antipsychotic monotherapy by history:  No  Recommended Plan for Multiple Antipsychotic Therapies: NA  Discharge Instructions    Diet - low sodium heart healthy   Complete by:  As directed    Discharge instructions   Complete by:  As directed    Take all medications as prescribed. Keep all follow-up appointments as scheduled.  Do not consume alcohol or use illegal drugs while on prescription medications. Report any adverse effects from your medications to your primary care provider promptly.  In the event of recurrent symptoms or worsening symptoms, call 911, a crisis hotline, or go to the nearest emergency department for evaluation.   Increase activity slowly   Complete by:  As directed      Allergies as of 07/18/2018   No Known Allergies     Medication List    STOP taking these medications   divalproex 500 MG DR tablet Commonly known as:  DEPAKOTE Replaced by:  divalproex 500 MG 24 hr tablet   lisinopril 20 MG tablet Commonly known as:  PRINIVIL,ZESTRIL   naproxen 500 MG tablet Commonly known as:  NAPROSYN     TAKE these medications     Indication  amLODipine 5 MG tablet Commonly known as:  NORVASC Take 1 tablet (5 mg total) by mouth daily. Start taking on:  07/19/2018  Indication:  High Blood Pressure Disorder   busPIRone 15  MG tablet Commonly known as:  BUSPAR Take 1 tablet (15 mg total) by mouth 2 (two) times daily. For anxiety  Indication:  Anxiety Disorder   cloNIDine 0.1 MG tablet Commonly known as:  CATAPRES Take 1 tablet (0.1 mg total) by mouth daily before breakfast. Start taking on:  07/19/2018 What changed:    when to take this  additional instructions  Indication:  High Blood Pressure Disorder   divalproex 500 MG 24 hr tablet Commonly known as:  DEPAKOTE ER Take 1 tablet (500 mg total) by mouth every evening. Replaces:  divalproex 500 MG DR tablet  Indication:  Depressive Phase of Manic-Depression   gabapentin 300 MG capsule Commonly known as:  NEURONTIN Take 1 capsule (300 mg total) by mouth 4 (four) times daily. What changed:    when to take this  additional instructions  Indication:  mood stabilization   hydrochlorothiazide 25 MG tablet Commonly known as:  HYDRODIURIL Take 1 tablet (25 mg total) by mouth daily. For high blood pressure  Indication:  High Blood Pressure Disorder   hydrOXYzine 25 MG tablet Commonly known as:  ATARAX/VISTARIL Take 1 tablet (25 mg total) by mouth every 6 (six) hours as needed for anxiety. What changed:    medication strength  how much to take  Indication:  Feeling Anxious   nicotine 21 mg/24hr patch Commonly known as:  NICODERM CQ - dosed in mg/24 hours Place 1 patch (21 mg total) onto the skin daily. Start taking on:  07/19/2018  Indication:  Nicotine Addiction   pantoprazole 20 MG tablet Commonly known as:  PROTONIX Take 1 tablet (20 mg total) by mouth daily. For acid reflux  Indication:  Gastroesophageal Reflux Disease   risperiDONE 1 MG tablet Commonly known as:  RISPERDAL Take 1 tablet (1 mg total) by mouth 2 (two) times daily. What changed:  additional instructions  Indication:  Major Depressive Disorder   traZODone 50 MG tablet Commonly known as:  DESYREL Take 1 tablet (50 mg total) by mouth at bedtime and may repeat dose  one time if needed. What changed:    when to take this  reasons to take this  Indication:  Anxiety Disorder, Trouble Sleeping      Follow-up Information    [facility] Follow up on 07/21/2018.   Specialty:  Behavioral Health Why:  Hospital follow-up on Thursday, July 21, 2018, 8:00AM. Please call to reschedule if you enter [facility]. Please bring: photo ID, social security card, and hospital discharge paperwork to this appt. Thank you.  Contact information: [address] [place] [address] [phone]        Services, [facility] Follow up.   Why:  Screening for possible admission on Tuesday, 10/30 at 7:45AM. Please bring: photo ID/proof of [place] residency, 14 day supply of medications, and clothing. Thank you.   Contact information: [address] [place] [address] [phone]        [facility] Follow up on 07/20/2018.   Why:  If you plan to be assessed for methadone maintenance, please follow-up with Les for your pre-scheduled appt on Wednesday, 10/30. Thank you.  Contact information: [address], [place] [address] Phone: [phone] Fax: [phone]          Follow-up recommendations:  Activity:  as tolerated Diet:  heart healthy  Comments:  Take all medications as prescribed. Keep all follow-up appointments as scheduled.  Do not consume alcohol or use illegal drugs while on prescription medications. Report any adverse effects from your medications to your primary care provider promptly.  In the event of recurrent symptoms or worsening symptoms, call 911, a crisis hotline, or go to the nearest emergency department for evaluation.   Signed: Oneta Rack, NP 07/18/2018, 12:42 PM

## 2018-07-18 NOTE — Progress Notes (Addendum)
  [facility] Adult Case Management Discharge Plan :  Will you be returning to the same living situation after discharge:  No.Pt is hoping for admission into [facility].  At discharge, do you have transportation home?: Yes,  taxi voucher in chart. PATIENT MUST DISCHARGE BY 7AM ON TUESDAY, 10/29 TO GET TO [facility] FOR SCREENING Do you have the ability to pay for your medications: Yes,  mental health  Release of information consent forms completed and submitted to medical records by CSW.   Patient to Follow up at: Follow-up Information    [facility] Follow up on 07/21/2018.   Specialty:  Behavioral Health Why:  Hospital follow-up on Thursday, July 21, 2018, 8:00AM. Please call to reschedule if you enter [facility]. Please bring: photo ID, social security card, and hospital discharge paperwork to this appt. Thank you.  Contact information: [address] [place] [address] [phone]        [facility], [facility] Follow up.   Why:  Screening for possible admission on Tuesday, 10/30 at 7:45AM. Please bring: photo ID/proof of [place] residency, 14 day supply of medications, and clothing. Thank you.   Contact information: [address] [place] [address] [phone]        [facility] Follow up on 07/20/2018.   Why:  If you plan to be assessed for methadone maintenance, please follow-up with Les as soon as possible per his instruction. He can be reached at [phone]. Thank you.  Contact information: [address], [place] [address] Phone: [phone] Fax: [phone]          Next level of care provider has access to [facility] Link:no  Safety Planning and Suicide Prevention discussed: Yes,  SPE completed with pt; pt declined to consent to collateral contact. SPI pamphlet and mobile crisis information provided.   Have you used any form of tobacco in the last 30 days? (Cigarettes, Smokeless Tobacco, Cigars,  and/or Pipes): Yes  Has patient been referred to the Quitline?: Patient refused referral  Patient has been referred for addiction treatment: Yes  Rona Ravens, LCSW 07/18/2018, 3:28 PM

## 2018-07-18 NOTE — BHH Suicide Risk Assessment (Signed)
[facility] Discharge Suicide Risk Assessment   Principal Problem: Severe bipolar I disorder, most recent episode depressed [facility]) Discharge Diagnoses:  Patient Active Problem List   Diagnosis Date Noted  . MDD (major depressive disorder), recurrent episode, severe (HCC) [F33.2] 07/14/2018  . Alcohol withdrawal syndrome with complication (HCC) [F10.239]   . Polysubstance dependence (HCC) [F19.20]   . Severe bipolar I disorder, most recent episode depressed (HCC) [F31.4]   . PTSD (post-traumatic stress disorder) [F43.10] 06/22/2018  . Bipolar I disorder, most recent episode depressed, severe without psychotic features (HCC) [F31.4] 06/21/2018  . PVC's (premature ventricular contractions) [I49.3] 03/04/2018  . OD (overdose of drug) [T50.901A] 03/04/2018  . Leukocytosis [D72.829] 03/04/2018  . Hypokalemia [E87.6] 03/04/2018  . Acute drug intoxication with complication (HCC) [F19.929]   . Acute urinary retention [R33.8] 02/11/2018  . Acute renal failure (ARF) (HCC) [N17.9] 02/10/2018  . Closed fracture of left distal fibula [S82.832A] 02/10/2018  . Withdrawal complaint [R68.89] 05/12/2017  . Symptomatic bradycardia [R00.1] 05/12/2017  . Acute kidney injury (HCC) [N17.9] 08/18/2016  . Boils [L02.92] 08/18/2016  . Rhabdomyolysis [M62.82] 08/18/2016  . Urinary retention [R33.9] 08/18/2016  . ARF (acute renal failure) (HCC) [N17.9] 08/18/2016  . AKI (acute kidney injury) (HCC) [N17.9] 08/17/2016  . Hallucination [R44.3] 08/17/2016  . Nausea & vomiting [R11.2] 10/04/2015  . Pruritic dermatitis [L29.9] 09/10/2015  . Rash and nonspecific skin eruption [R21] 08/21/2015  . Body lice [B85.1] 08/05/2015  . Prolonged QT interval [R94.31] 07/18/2015  . Dysmenorrhea [N94.6] 04/10/2015  . Trichimoniasis [A59.9] 04/10/2015  . Chest pain [R07.9] 03/07/2015  . Healthcare maintenance [Z00.00] 03/07/2015  . RLQ abdominal pain [R10.31] 02/18/2012  . GERD (gastroesophageal reflux disease) [K21.9] 01/28/2012  .  Menorrhagia [N92.0] 01/25/2012  . Weakness generalized [R53.1] 12/05/2011  . Tobacco abuse [Z72.0] 11/06/2011  . HTN (hypertension) [I10] 10/22/2011  . Abscess and cellulitis [L03.90, L02.91] 10/22/2011  . Hepatitis C, chronic (HCC) [B18.2] 10/22/2011  . History of TB (tuberculosis) [Z86.11] 10/22/2011  . Opioid use disorder, severe, dependence (HCC) [F11.20]   . Polysubstance abuse (HCC) [F19.10] 04/17/2011    Total Time spent with patient: 15 minutes  Musculoskeletal: Strength & Muscle Tone: within normal limits Gait & Station: normal Patient leans: Right and N/A  Psychiatric Specialty Exam: Review of Systems  All other systems reviewed and are negative.   Blood pressure (!) 157/108, pulse 85, temperature (!) 97.5 F (36.4 C), resp. rate 20, height 5\' 3"  (1.6 m), weight 51.5 kg, last menstrual period 12/06/2015, SpO2 99 %.Body mass index is 20.11 kg/m.  General Appearance: Casual  Eye Contact::  Fair  Speech:  Normal Rate409  Volume:  Normal  Mood:  Euthymic  Affect:  Congruent  Thought Process:  Coherent and Descriptions of Associations: Intact  Orientation:  Full (Time, Place, and Person)  Thought Content:  Logical  Suicidal Thoughts:  No  Homicidal Thoughts:  No  Memory:  Immediate;   Fair Recent;   Fair Remote;   Fair  Judgement:  Intact  Insight:  Fair  Psychomotor Activity:  Normal  Concentration:  Fair  Recall:  [employer] of Knowledge:Fair  Language: Fair  Akathisia:  Negative  Handed:  Right  AIMS (if indicated):     Assets:  Communication Skills Desire for Improvement Physical Health Resilience  Sleep:  Number of Hours: 6.75  Cognition: WNL  ADL's:  Intact   Mental Status Per Nursing Assessment::   On Admission:  Suicidal ideation indicated by patient, Self-harm thoughts, Intention to act on suicide  plan, Plan includes specific time, place, or method  Demographic Factors:  Divorced or widowed, Caucasian, Low socioeconomic status and Living  alone  Loss Factors: Loss of significant relationship and Financial problems/change in socioeconomic status  Historical Factors: Impulsivity  Risk Reduction Factors:   Positive coping skills or problem solving skills  Continued Clinical Symptoms:  Bipolar Disorder:   Bipolar II Alcohol/Substance Abuse/Dependencies  Cognitive Features That Contribute To Risk:  None    Suicide Risk:  Minimal: No identifiable suicidal ideation.  Patients presenting with no risk factors but with morbid ruminations; may be classified as minimal risk based on the severity of the depressive symptoms  Follow-up Information    [facility] Follow up on 07/21/2018.   Specialty:  Behavioral Health Why:  Hospital follow-up on Thursday, July 21, 2018, 8:00AM. Please call to reschedule if you enter [facility]. Please bring: photo ID, social security card, and hospital discharge paperwork to this appt. Thank you.  Contact information: [address] [place] [address] [phone]        Services, [facility] Follow up.   Why:  Screening for possible admission on Tuesday, 10/30 at 7:45AM. Please bring: photo ID/proof of [place] residency, 14 day supply of medications, and clothing. Thank you.   Contact information: [address] [place] [address] [phone]        [facility] Follow up on 07/20/2018.   Why:  If you plan to be assessed for methadone maintenance, please follow-up with Les as soon as possible per his instruction. He can be reached at [phone]. Thank you.  [address] information: [address], [place] [address] Phone: [phone] Fax: [phone]          Plan Of Care/Follow-up recommendations:  Activity:  ad lib  Antonieta Pert, MD 07/18/2018, 3:48 PM

## 2018-07-18 NOTE — Progress Notes (Signed)
[facility] MD Progress Note  07/18/2018 2:03 PM Ariana Jensen  MRN:  [id number] Subjective:  Patient is seen and examined. Patient is a 52 year old female with a past psychiatric history significant for polysubstance dependence and bipolar disorder who presented to the [facility] emergency department by [facility]. This was secondary to ongoing substance abuse with heroin and alcohol. The patient had a recent hospitalization at our facility for similar circumstances on 06/21/2018. Patient stated she is currently homeless. After this last hospitalization she returned to the home where she was staying with her reported boyfriend. Early after she returned to that domicile she was "kidnapped" by her boyfriend. According to the patient he is currently in jail, and after she had him arrested she had to leave the house. The house belongs to the parents of her significant other. She stated approximately 10 to 12 days after her admission she relapsed on substances again. The patient related that she had attempted to kill herself on 2 previous occasions, the most recent being 18 months ago via an overdose. She has been hospitalized for substance related issues for approximately 4-5 times in the last year and a half. She was discharged on Depakote, risk for doll, clonidine, Vistaril, trazodone, Protonix. She admitted to drinking at least a 12 pack a day, and was unable to quantitate the amount of opiates she had been using. She stated she is still been using heroin. She admitted to helplessness, hopelessness and worthlessness. She admitted to suicidal ideation. She was admitted to the hospital for evaluation and stabilization.  Objective: Patient is seen and examined.  Patient is a 52 year old female with the above-stated past psychiatric history who is seen in follow-up.  She is doing well.  Her withdrawal symptoms have improved.  Her mood is improved.  She is much less  shaky.  The plan is for [facility] to [facility].  She will be leaving at 7 AM.  She denied any suicidal ideation.  She also stated that she has an appointment on Wednesday to be evaluated for a methadone maintenance clinic.  We discussed that.  Her blood pressure remains elevated at 157/108, pulse is 85.  She slept 6.75 hours last night.  We will need to get a Depakote level, liver function enzymes and CBC prior to discharge.  She is on amlodipine 5 mg p.o. daily and we will increase that.  Principal Problem: Severe bipolar I disorder, most recent episode depressed (HCC) Diagnosis:   Patient Active Problem List   Diagnosis Date Noted  . MDD (major depressive disorder), recurrent episode, severe (HCC) [F33.2] 07/14/2018  . Alcohol withdrawal syndrome with complication (HCC) [F10.239]   . Polysubstance dependence (HCC) [F19.20]   . Severe bipolar I disorder, most recent episode depressed (HCC) [F31.4]   . PTSD (post-traumatic stress disorder) [F43.10] 06/22/2018  . Bipolar I disorder, most recent episode depressed, severe without psychotic features (HCC) [F31.4] 06/21/2018  . PVC's (premature ventricular contractions) [I49.3] 03/04/2018  . OD (overdose of drug) [T50.901A] 03/04/2018  . Leukocytosis [D72.829] 03/04/2018  . Hypokalemia [E87.6] 03/04/2018  . Acute drug intoxication with complication (HCC) [F19.929]   . Acute urinary retention [R33.8] 02/11/2018  . Acute renal failure (ARF) (HCC) [N17.9] 02/10/2018  . Closed fracture of left distal fibula [S82.832A] 02/10/2018  . Withdrawal complaint [R68.89] 05/12/2017  . Symptomatic bradycardia [R00.1] 05/12/2017  . Acute kidney injury (HCC) [N17.9] 08/18/2016  . Boils [L02.92] 08/18/2016  . Rhabdomyolysis [M62.82] 08/18/2016  .  Urinary retention [R33.9] 08/18/2016  . ARF (acute renal failure) (HCC) [N17.9] 08/18/2016  . AKI (acute kidney injury) (HCC) [N17.9] 08/17/2016  . Hallucination [R44.3] 08/17/2016  .  Nausea & vomiting [R11.2] 10/04/2015  . Pruritic dermatitis [L29.9] 09/10/2015  . Rash and nonspecific skin eruption [R21] 08/21/2015  . Body lice [B85.1] 08/05/2015  . Prolonged QT interval [R94.31] 07/18/2015  . Dysmenorrhea [N94.6] 04/10/2015  . Trichimoniasis [A59.9] 04/10/2015  . Chest pain [R07.9] 03/07/2015  . Healthcare maintenance [Z00.00] 03/07/2015  . RLQ abdominal pain [R10.31] 02/18/2012  . GERD (gastroesophageal reflux disease) [K21.9] 01/28/2012  . Menorrhagia [N92.0] 01/25/2012  . Weakness generalized [R53.1] 12/05/2011  . Tobacco abuse [Z72.0] 11/06/2011  . HTN (hypertension) [I10] 10/22/2011  . Abscess and cellulitis [L03.90, L02.91] 10/22/2011  . Hepatitis C, chronic (HCC) [B18.2] 10/22/2011  . History of TB (tuberculosis) [Z86.11] 10/22/2011  . Opioid use disorder, severe, dependence (HCC) [F11.20]   . Polysubstance abuse (HCC) [F19.10] 04/17/2011   Total Time spent with patient: 15 minutes  Past Psychiatric History: See admission H&P  Past Medical History:  Past Medical History:  Diagnosis Date  . Anxiety   . ASCUS (atypical squamous cells of undetermined significance) on Pap smear   . Chronic lower back pain   . Cocaine abuse (HCC)    crack cocaine  . Depression   . Fibromyalgia   . GERD (gastroesophageal reflux disease)   . Hepatitis C   . Hepatitis C antibody test positive   . Hypertension   . Hypokalemia 03/04/2018  . Leukocytosis 03/04/2018  . Opioid dependence (HCC)   . PVC's (premature ventricular contractions) 03/04/2018  . Seizures (HCC)    "don't know why; I've had 2 in the past year" (08/18/2016)  . 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  . Trichomonas 04/17/2011   Diagnosed 04/02/11 during hospitalization, treated with Flagyl 2g, patient instructed to have partner treated (must follow-up)   . Tuberculosis    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 age 31, now s/p 1-yr of multidrug treatment (dates  unknown)    Past Surgical History:  Procedure Laterality Date  . CESAREAN SECTION  1984  . DILATION AND CURETTAGE OF UTERUS    . I&D EXTREMITY  10/18/2011   Procedure: IRRIGATION AND DEBRIDEMENT EXTREMITY;  Surgeon: Sharma Covert, MD;  Location: [facility] OR;  Service: Orthopedics;  Laterality: Left;  . I&D EXTREMITY  10/21/2011   Procedure: IRRIGATION AND DEBRIDEMENT EXTREMITY;  Surgeon: Sharma Covert, MD;  Location: [facility] OR;  Service: Orthopedics;  Laterality: Left;  . SALPINGECTOMY Left March 2010   ectopic pregnancy   Family History:  Family History  Problem Relation Age of Onset  . Heart attack Mother   . Heart attack Father   . Heart attack Paternal Grandmother   . Heart attack Paternal Grandfather   . Cirrhosis Brother    Family Psychiatric  History: The admission H&P Social History:  Social History   Substance and Sexual Activity  Alcohol Use Yes  . Alcohol/week: 0.0 standard drinks   Comment: occ     Social History   Substance and Sexual Activity  Drug Use Yes  . Types: Oxycodone, Heroin, Cocaine, IV   Comment: Pt stated "I do heroin"    Social History   Socioeconomic History  . Marital status: Widowed    Spouse name: Not on file  . Number of children: 1  . Years of education: 7th grade  . Highest education level: Not on file  Occupational History  .  Not on file  Social Needs  . Financial resource strain: Not on file  . Food insecurity:    Worry: Not on file    Inability: Not on file  . Transportation needs:    Medical: Not on file    Non-medical: Not on file  Tobacco Use  . Smoking status: Current Every Day Smoker    Packs/day: 0.50    Years: 32.00    Pack years: 16.00    Types: Cigarettes  . Smokeless tobacco: Never Used  Substance and Sexual Activity  . Alcohol use: Yes    Alcohol/week: 0.0 standard drinks    Comment: occ  . Drug use: Yes    Types: Oxycodone, Heroin, Cocaine, IV    Comment: Pt stated "I do heroin"  . Sexual activity: Not  Currently    Birth control/protection: None  Lifestyle  . Physical activity:    Days per week: Not on file    Minutes per session: Not on file  . Stress: Not on file  Relationships  . Social connections:    Talks on phone: Not on file    Gets together: Not on file    Attends religious service: Not on file    Active member of club or organization: Not on file    Attends meetings of clubs or organizations: Not on file    Relationship status: Not on file  Other Topics Concern  . Not on file  Social History Narrative   Lives in [place]   Currently Unemployeed   Additional Social History:                         Sleep: Good  Appetite:  Good  Current Medications: Current Facility-Administered Medications  Medication Dose Route Frequency Provider Last Rate Last Dose  . acetaminophen (TYLENOL) tablet 650 mg  650 mg Oral Q6H PRN Kerry Hough, PA-C   650 mg at 07/18/18 1025  . alum & mag hydroxide-simeth (MAALOX/MYLANTA) 200-200-20 MG/5ML suspension 30 mL  30 mL Oral Q4H PRN Donell Sievert E, PA-C      . amLODipine (NORVASC) tablet 5 mg  5 mg Oral Daily Antonieta Pert, MD   5 mg at 07/18/18 1023  . chlordiazePOXIDE (LIBRIUM) capsule 25 mg  25 mg Oral TID PRN Antonieta Pert, MD   25 mg at 07/17/18 2115  . [START ON 07/19/2018] cloNIDine (CATAPRES) tablet 0.1 mg  0.1 mg Oral QAC breakfast Antonieta Pert, MD      . dicyclomine (BENTYL) tablet 20 mg  20 mg Oral Q6H PRN Antonieta Pert, MD   20 mg at 07/15/18 1957  . divalproex (DEPAKOTE ER) 24 hr tablet 500 mg  500 mg Oral QPM Antonieta Pert, MD   500 mg at 07/17/18 1728  . folic acid (FOLVITE) tablet 1 mg  1 mg Oral Daily Antonieta Pert, MD   1 mg at 07/18/18 1023  . gabapentin (NEURONTIN) capsule 300 mg  300 mg Oral QID Money, Feliz Beam B, FNP   300 mg at 07/18/18 1022  . hydrOXYzine (ATARAX/VISTARIL) tablet 25 mg  25 mg Oral Q6H PRN Kerry Hough, PA-C   25 mg at 07/17/18 2116  . magnesium  hydroxide (MILK OF MAGNESIA) suspension 30 mL  30 mL Oral Daily PRN Donell Sievert E, PA-C      . methocarbamol (ROBAXIN) tablet 500 mg  500 mg Oral Q8H PRN Antonieta Pert, MD   500 mg  at 07/18/18 1025  . naproxen (NAPROSYN) tablet 500 mg  500 mg Oral BID PRN Antonieta Pert, MD   500 mg at 07/17/18 2115  . nicotine (NICODERM CQ - dosed in mg/24 hours) patch 21 mg  21 mg Transdermal Daily Antonieta Pert, MD   21 mg at 07/18/18 1023  . pantoprazole (PROTONIX) EC tablet 40 mg  40 mg Oral Daily Antonieta Pert, MD   40 mg at 07/18/18 1023  . risperiDONE (RISPERDAL) tablet 1 mg  1 mg Oral BID Antonieta Pert, MD   1 mg at 07/18/18 1022  . thiamine (VITAMIN B-1) tablet 100 mg  100 mg Oral Daily Antonieta Pert, MD   100 mg at 07/18/18 1023  . traZODone (DESYREL) tablet 50 mg  50 mg Oral QHS,MR X 1 Kerry Hough, PA-C   50 mg at 07/17/18 2116    Lab Results:  Results for orders placed or performed during the hospital encounter of 07/14/18 (from the past 48 hour(s))  Valproic acid level     Status: Abnormal   Collection Time: 07/18/18  6:29 AM  Result Value Ref Range   Valproic Acid Lvl 37 (L) 50.0 - 100.0 ug/mL    Comment: Performed at [facility], [address] [place], [place] [address]    Blood Alcohol level:  Lab Results  Component Value Date   ETH 45 (H) 07/14/2018   ETH <10 06/21/2018    Metabolic Disorder Labs: Lab Results  Component Value Date   HGBA1C 5.8 (H) 06/22/2018   MPG 120 06/22/2018   MPG 116.89 02/11/2018   No results found for: PROLACTIN Lab Results  Component Value Date   CHOL 181 06/22/2018   TRIG 216 (H) 06/22/2018   HDL 55 06/22/2018   CHOLHDL 3.3 06/22/2018   VLDL 43 (H) 06/22/2018   LDLCALC 83 06/22/2018   LDLCALC 73 03/07/2015    Physical Findings: AIMS: Facial and Oral Movements Muscles of Facial Expression: None, normal Lips and Perioral Area: None, normal Jaw: None, normal Tongue: None,  normal,Extremity Movements Upper (arms, wrists, hands, fingers): None, normal Lower (legs, knees, ankles, toes): None, normal, Trunk Movements Neck, shoulders, hips: None, normal, Overall Severity Severity of abnormal movements (highest score from questions above): None, normal Incapacitation due to abnormal movements: None, normal Patient's awareness of abnormal movements (rate only patient's report): No Awareness, Dental Status Current problems with teeth and/or dentures?: No Does patient usually wear dentures?: No  CIWA:  CIWA-Ar Total: 10 COWS:  COWS Total Score: 2  Musculoskeletal: Strength & Muscle Tone: within normal limits Gait & Station: normal Patient leans: N/A  Psychiatric Specialty Exam: Physical Exam  Constitutional: She is oriented to person, place, and time. She appears well-developed and well-nourished.  HENT:  Head: Normocephalic and atraumatic.  Respiratory: Effort normal.  Neurological: She is alert and oriented to person, place, and time.    ROS  Blood pressure (!) 157/108, pulse 85, temperature (!) 97.5 F (36.4 C), resp. rate 20, height 5\' 3"  (1.6 m), weight 51.5 kg, last menstrual period 12/06/2015, SpO2 99 %.Body mass index is 20.11 kg/m.  General Appearance: Casual  Eye Contact:  Fair  Speech:  Normal Rate  Volume:  Normal  Mood:  Euthymic  Affect:  Congruent  Thought Process:  Coherent and Descriptions of Associations: Intact  Orientation:  Full (Time, Place, and Person)  Thought Content:  Logical  Suicidal Thoughts:  No  Homicidal Thoughts:  No  Memory:  Immediate;  Fair Recent;   Fair Remote;   Fair  Judgement:  Intact  Insight:  Fair  Psychomotor Activity:  Normal  Concentration:  Concentration: Fair and Attention Span: Fair  Recall:  [employer] of Knowledge:  Fair  Language:  Fair  Akathisia:  Negative  Handed:  Right  AIMS (if indicated):     Assets:  Communication Skills Desire for Improvement Physical Health Resilience   ADL's:  Intact  Cognition:  WNL  Sleep:  Number of Hours: 6.75     Treatment Plan Summary: Daily contact with patient to assess and evaluate symptoms and progress in treatment, Medication management and Plan : Patient is seen and examined.  Patient is a 52 year old female with the above-stated past psychiatric history who is seen in follow-up.  #1 bipolar disorder type I-continue trazodone 50 mg p.o. nightly for insomnia, continue Risperdal 1 mg p.o. twice daily for mood stability, continue Depakote ER 500 mg p.o. nightly for mood stability, continue gabapentin 300 mg p.o. 4 times daily for withdrawal symptoms and mood stability.  #2 polysubstance dependence/alcohol withdrawal symptoms-she has completed the Librium detox protocol and this will be stopped.  We will also stop the clonidine detox protocol.  #3 hypertension-increase amlodipine to 10 mg p.o. daily.  #4 disposition planning-patient will be discharged tomorrow [facility].  Antonieta Pert, MD 07/18/2018, 2:03 PM

## 2018-07-19 NOTE — Progress Notes (Signed)
Chaplain follow up with [facility] after group re: fiance incarceration in [facility]   With Smantha's permission, Chaplain contacted chaplain at jail to determine fiance's ability to use phone and report that Shaneil was at behavioral health.  Jail reports Milee's fiance has limited access to phone.  Arleigh understands best way to contact is to write letter.  She has address.

## 2019-12-08 ENCOUNTER — Ambulatory Visit: Payer: Medicaid Other | Attending: Internal Medicine

## 2019-12-08 DIAGNOSIS — Z23 Encounter for immunization: Secondary | ICD-10-CM

## 2019-12-08 NOTE — Progress Notes (Signed)
   Covid-19 Vaccination Clinic  Name:  Ariana Jensen    MRN: [id number] DOB: 31-Oct-1965  12/08/2019  [name] was observed post Covid-19 immunization for 15 minutes without incident. She was provided with Vaccine Information Sheet and instruction to access the V-Safe system.   [name] was instructed to call 911 with any severe reactions post vaccine: Marland Kitchen Difficulty breathing  . Swelling of face and throat  . A fast heartbeat  . A bad rash all over body  . Dizziness and weakness   Immunizations Administered    Name Date Dose VIS Date Route   Pfizer COVID-19 Vaccine 12/08/2019  9:23 AM 0.3 mL 09/01/2019 Intramuscular   Manufacturer: [employer], [employer]   Lot: [id number]   NDC: [address]-5

## 2019-12-22 IMAGING — CR DG ANKLE COMPLETE 3+V*L*
3 series · 3 of 3 positions shown · non-contrast
Comparison: None.

CLINICAL DATA: Question fall from standing. Left lateral ankle pain
and swelling.

EXAM:
LEFT ANKLE COMPLETE - 3+ VIEW

[ankle ap]
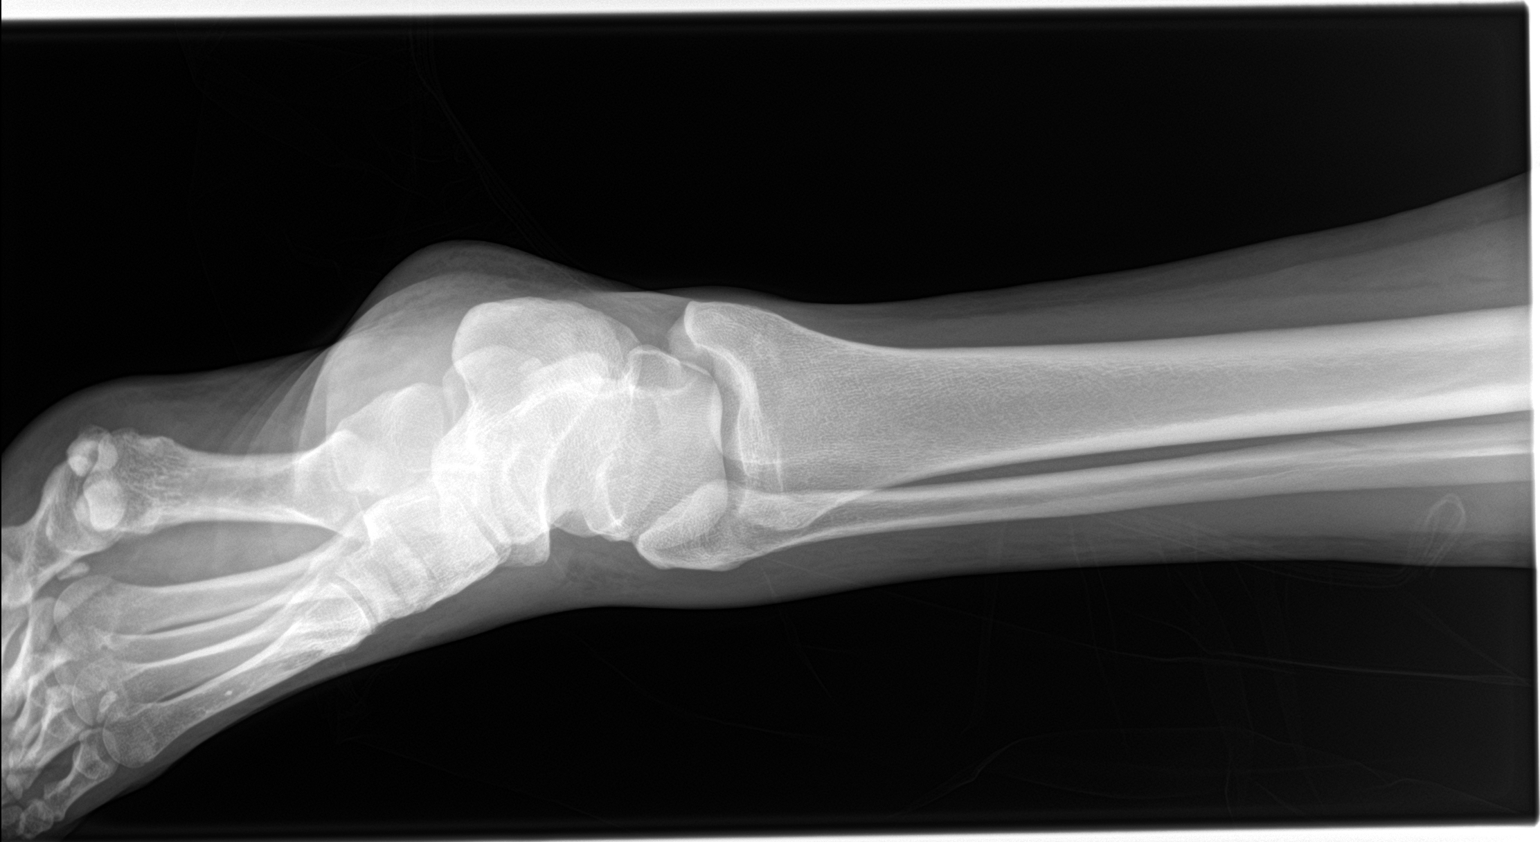

[ankle obl]
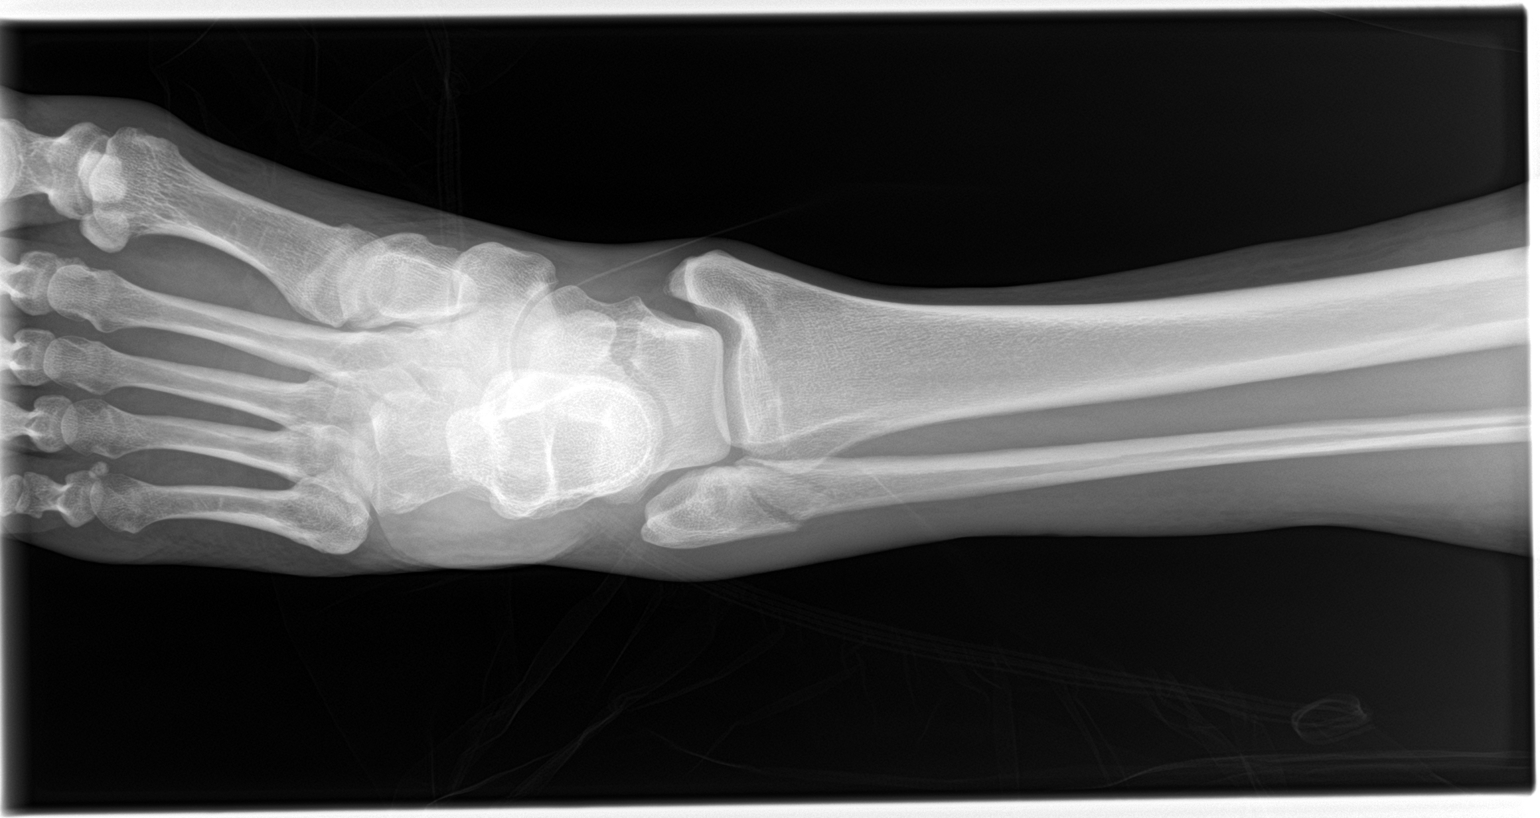

[ankle lat]
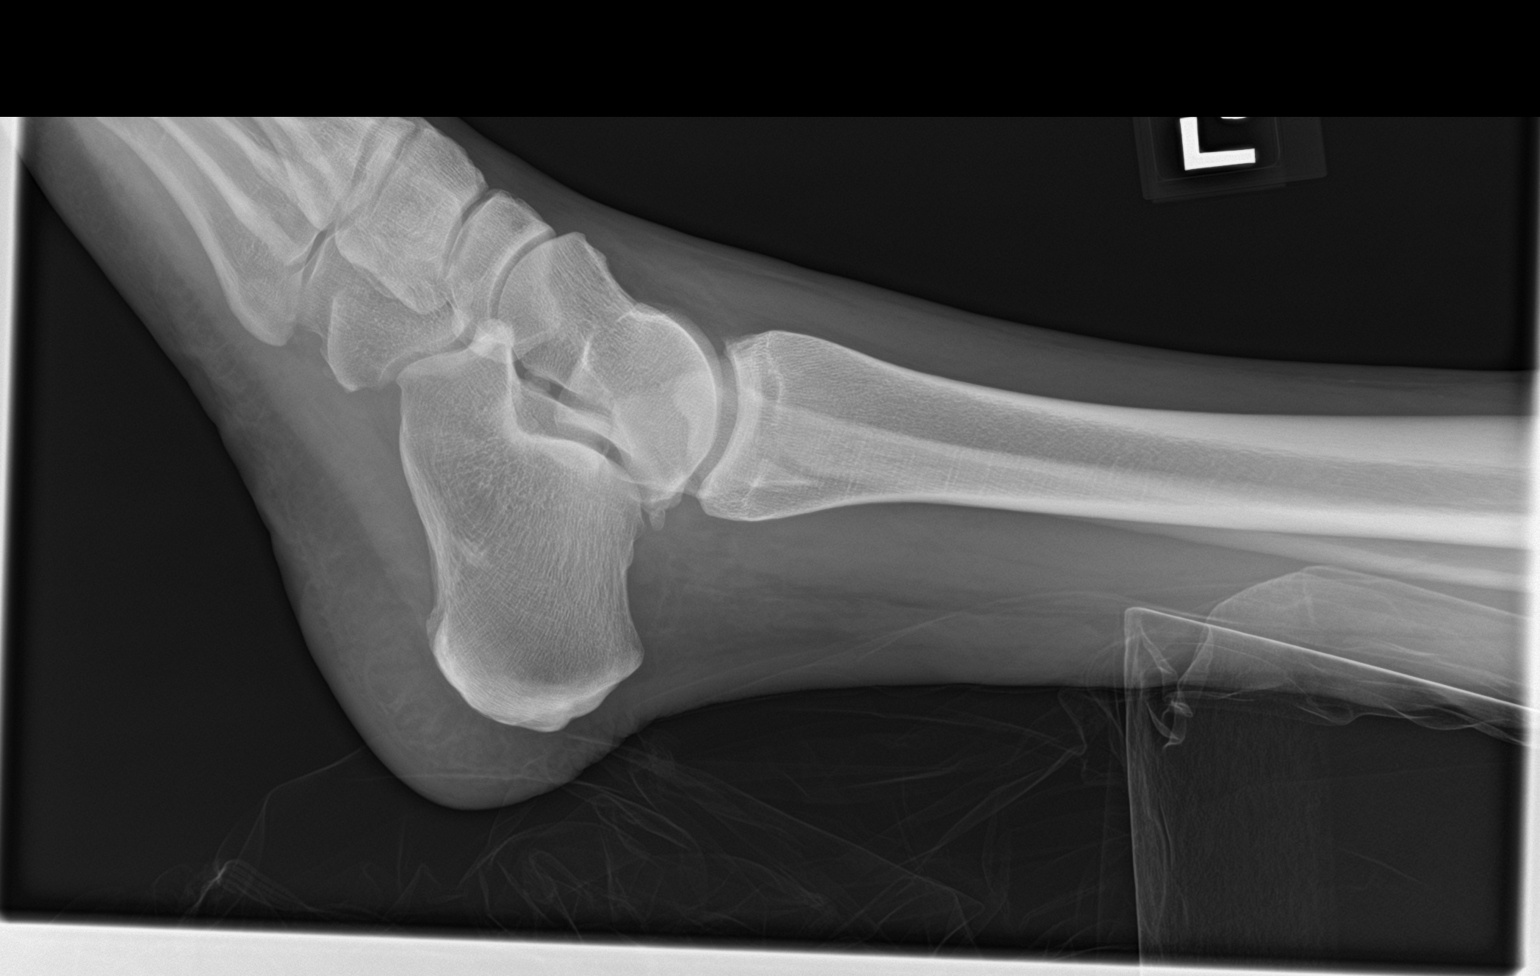

[3 of 3 positions shown; findings below may reference images not displayed]

FINDINGS: Oblique mildly displaced distal fibular fracture at the level of the
ankle mortise. No mortise widening. Lateral soft tissue edema. No
additional fracture of the ankle. Overall alignment is maintained.
IMPRESSION: Oblique mildly displaced distal fibular fracture at the level of the
ankle mortise.

## 2020-01-02 ENCOUNTER — Ambulatory Visit: Payer: Medicaid Other | Attending: Internal Medicine

## 2020-01-15 ENCOUNTER — Ambulatory Visit: Payer: Medicaid Other | Attending: Internal Medicine

## 2020-01-15 DIAGNOSIS — Z23 Encounter for immunization: Secondary | ICD-10-CM

## 2020-01-15 NOTE — Progress Notes (Signed)
   Covid-19 Vaccination Clinic  Name:  Ariana Jensen    MRN: [id number] DOB: [birth year]  01/15/2020  [name] was observed post Covid-19 immunization for 15 minutes without incident. She was provided with Vaccine Information Sheet and instruction to access the V-Safe system.   [name] was instructed to call 911 with any severe reactions post vaccine: Marland Kitchen Difficulty breathing  . Swelling of face and throat  . A fast heartbeat  . A bad rash all over body  . Dizziness and weakness   Immunizations Administered    Name Date Dose VIS Date Route   Pfizer COVID-19 Vaccine 01/15/2020  4:49 PM 0.3 mL 11/15/2018 Intramuscular   Manufacturer: [employer], [employer]   Lot: [id number]   NDC: [address]-0

## 2020-10-04 ENCOUNTER — Other Ambulatory Visit: Payer: Self-pay | Admitting: Obstetrics and Gynecology

## 2020-10-04 DIAGNOSIS — Z1231 Encounter for screening mammogram for malignant neoplasm of breast: Secondary | ICD-10-CM

## 2020-10-10 ENCOUNTER — Telehealth: Payer: Self-pay | Admitting: *Deleted

## 2020-10-10 NOTE — Telephone Encounter (Signed)
°   Autumn Pruitt DOB: Jan 28, 1966 MRN: [id number]   RIDER WAIVER AND RELEASE OF LIABILITY  For purposes of improving physical access to our facilities, [facility] is pleased to partner with third parties to provide [facility] patients or other authorized individuals the option of convenient, on-demand ground transportation services (the [employer]) through use of the technology service that enables users to request on-demand ground transportation from independent third-party providers.  By opting to use and accept these [employer], I, the undersigned, hereby agree on behalf of myself, and on behalf of any minor child using the [employer] for whom I am the parent or legal guardian, as follows:  1. Science writer provided to me are provided by independent third-party transportation providers who are not [employer] or employees and who are unaffiliated with [employer]. 2. [facility] is neither a transportation carrier nor a common or public carrier. 3. [facility] has no control over the quality or safety of the transportation that occurs as a result of the [employer]. 4. [facility] cannot guarantee that any third-party transportation provider will complete any arranged transportation service. 5. [facility] makes no representation, warranty, or guarantee regarding the reliability, timeliness, quality, safety, suitability, or availability of any of the Transport Services or that they will be error free. 6. I fully understand that traveling by vehicle involves risks and dangers of serious bodily injury, including permanent disability, paralysis, and death. I agree, on behalf of myself and on behalf of any minor child using the Transport Services for whom I am the parent or legal guardian, that the entire risk arising out of my use of the [employer] remains solely with me, to the maximum extent permitted under applicable law. 7. The [employer] are provided as is and as available. [facility] disclaims all representations and warranties, express, implied or statutory, not expressly set out in these terms, including the implied warranties of merchantability and fitness for a particular purpose. 8. I hereby waive and release [facility], its agents, employees, officers, directors, representatives, insurers, attorneys, assigns, successors, subsidiaries, and affiliates from any and all past, present, or future claims, demands, liabilities, actions, causes of action, or suits of any kind directly or indirectly arising from acceptance and use of the [employer]. 9. I further waive and release [facility] and its affiliates from all present and future liability and responsibility for any injury or death to persons or damages to property caused by or related to the use of the [employer]. 10. I have read this Waiver and Release of Liability, and I understand the terms used in it and their legal significance. This Waiver is freely and voluntarily given with the understanding that my right (as well as the right of any minor child for whom I am the parent or legal guardian using the [employer]) to legal recourse against [facility] in connection with the [employer] is knowingly surrendered in return for use of these services.   I attest that I read the consent document to [name], gave Ms. Bayly the opportunity to ask questions and answered the questions asked (if any). I affirm that [name] then provided consent for she's participation in this program.     Evette Doffing

## 2020-10-17 ENCOUNTER — Ambulatory Visit: Payer: Medicaid Other

## 2020-11-01 ENCOUNTER — Emergency Department (HOSPITAL_COMMUNITY): Payer: Self-pay

## 2020-11-01 ENCOUNTER — Emergency Department (HOSPITAL_COMMUNITY)
Admission: EM | Admit: 2020-11-01 | Discharge: 2020-11-01 | Disposition: A | Discharge status: Home / Self Care | Payer: Self-pay | Attending: Emergency Medicine | Admitting: Emergency Medicine

## 2020-11-01 ENCOUNTER — Other Ambulatory Visit: Payer: Self-pay

## 2020-11-01 ENCOUNTER — Encounter (HOSPITAL_COMMUNITY): Payer: Self-pay

## 2020-11-01 DIAGNOSIS — B9689 Other specified bacterial agents as the cause of diseases classified elsewhere: Secondary | ICD-10-CM | POA: Insufficient documentation

## 2020-11-01 DIAGNOSIS — K5900 Constipation, unspecified: Secondary | ICD-10-CM | POA: Insufficient documentation

## 2020-11-01 DIAGNOSIS — I10 Essential (primary) hypertension: Secondary | ICD-10-CM | POA: Insufficient documentation

## 2020-11-01 DIAGNOSIS — Z79899 Other long term (current) drug therapy: Secondary | ICD-10-CM | POA: Insufficient documentation

## 2020-11-01 DIAGNOSIS — M25552 Pain in left hip: Secondary | ICD-10-CM | POA: Insufficient documentation

## 2020-11-01 DIAGNOSIS — F17210 Nicotine dependence, cigarettes, uncomplicated: Secondary | ICD-10-CM | POA: Insufficient documentation

## 2020-11-01 DIAGNOSIS — N760 Acute vaginitis: Secondary | ICD-10-CM | POA: Insufficient documentation

## 2020-11-01 DIAGNOSIS — Z20822 Contact with and (suspected) exposure to covid-19: Secondary | ICD-10-CM | POA: Insufficient documentation

## 2020-11-01 LAB — URINALYSIS, ROUTINE W REFLEX MICROSCOPIC
Bilirubin Urine: NEGATIVE
Glucose, UA: NEGATIVE mg/dL
Hgb urine dipstick: NEGATIVE
Ketones, ur: NEGATIVE mg/dL
Leukocytes,Ua: NEGATIVE
Nitrite: NEGATIVE
Protein, ur: NEGATIVE mg/dL
Specific Gravity, Urine: 1.02 (ref 1.005–1.030)
pH: 6 (ref 5.0–8.0)

## 2020-11-01 LAB — CBC
HCT: 44.4 % (ref 36.0–46.0)
Hemoglobin: 14.5 g/dL (ref 12.0–15.0)
MCH: 31.7 pg (ref 26.0–34.0)
MCHC: 32.7 g/dL (ref 30.0–36.0)
MCV: 97.2 fL (ref 80.0–100.0)
Platelets: 208 10*3/uL (ref 150–400)
RBC: 4.57 MIL/uL (ref 3.87–5.11)
RDW: 14.1 % (ref 11.5–15.5)
WBC: 8.9 10*3/uL (ref 4.0–10.5)
nRBC: 0 % (ref 0.0–0.2)

## 2020-11-01 LAB — COMPREHENSIVE METABOLIC PANEL
ALT: 24 U/L (ref 0–44)
AST: 26 U/L (ref 15–41)
Albumin: 4.2 g/dL (ref 3.5–5.0)
Alkaline Phosphatase: 63 U/L (ref 38–126)
Anion gap: 8 (ref 5–15)
BUN: 23 mg/dL — ABNORMAL HIGH (ref 6–20)
CO2: 24 mmol/L (ref 22–32)
Calcium: 9.1 mg/dL (ref 8.9–10.3)
Chloride: 101 mmol/L (ref 98–111)
Creatinine, Ser: 0.65 mg/dL (ref 0.44–1.00)
GFR, Estimated: >60 mL/min (ref >60)
Glucose, Bld: 107 mg/dL — ABNORMAL HIGH (ref 70–99)
Potassium: 3.7 mmol/L (ref 3.5–5.1)
Sodium: 133 mmol/L — ABNORMAL LOW (ref 135–145)
Total Bilirubin: 0.6 mg/dL (ref 0.3–1.2)
Total Protein: 7.7 g/dL (ref 6.5–8.1)

## 2020-11-01 LAB — WET PREP, GENITAL
Sperm: NONE SEEN
Trich, Wet Prep: NONE SEEN
Yeast Wet Prep HPF POC: NONE SEEN

## 2020-11-01 LAB — SARS CORONAVIRUS 2 (TAT 6-24 HRS): SARS Coronavirus 2: NEGATIVE

## 2020-11-01 LAB — LIPASE, BLOOD: Lipase: 30 U/L (ref 11–51)

## 2020-11-01 MED ORDER — KETOROLAC TROMETHAMINE 30 MG/ML IJ SOLN
30.0000 mg | Freq: Once | INTRAMUSCULAR | Status: AC
  Administered 2020-11-01: 30 mg via INTRAVENOUS
  Filled 2020-11-01: qty 1

## 2020-11-01 MED ORDER — DICLOFENAC SODIUM 1 % EX GEL: 2.0000 g | Freq: Four times a day (QID) | CUTANEOUS | 0 refills | Status: AC

## 2020-11-01 MED ORDER — METRONIDAZOLE 0.75 % VA GEL: 1.0000 | Freq: Two times a day (BID) | VAGINAL | 0 refills | Status: AC

## 2020-11-01 MED ORDER — IOHEXOL 300 MG/ML  SOLN
100.0000 mL | Freq: Once | INTRAMUSCULAR | Status: AC | PRN
  Administered 2020-11-01: 100 mL via INTRAVENOUS

## 2020-11-01 MED ORDER — AMLODIPINE BESYLATE 5 MG PO TABS
10.0000 mg | ORAL_TABLET | Freq: Once | ORAL | Status: AC
  Administered 2020-11-01: 10 mg via ORAL
  Filled 2020-11-01 (×2): qty 2

## 2020-11-01 MED ORDER — DICYCLOMINE HCL 10 MG PO CAPS
10.0000 mg | ORAL_CAPSULE | Freq: Once | ORAL | Status: AC
  Administered 2020-11-01: 10 mg via ORAL
  Filled 2020-11-01: qty 1

## 2020-11-01 MED ORDER — LIDOCAINE 5 % EX PTCH: 1.0000 | MEDICATED_PATCH | CUTANEOUS | 0 refills | Status: DC

## 2020-11-01 NOTE — ED Triage Notes (Signed)
Pt arrived via walk in, c/o lower bilateral abd pain x2 weeks and left thigh pain left leg, that radiates down leg occasionally x3 months. Denies any n/v or diarrhea or urinary sx.

## 2020-11-01 NOTE — ED Notes (Signed)
Pt went to bathroom, ambulated w/o assistance, steady gait

## 2020-11-01 NOTE — Discharge Instructions (Addendum)
Your work-up today was overall reassuring. Your CT scan showed a cyst on your left kidney, which is not an emergency, but you will need to follow-up with a primary care doctor about this.  It also showed that you have increased stool in your lower abdomen, which may be a cause of your pain.  You may take over-the-counter laxatives, eat high-fiber foods.  Please see the handout for examples of high-fiber foods.  For your hip pain, you may use the Lidoderm patches and external anti-inflammatory cream in addition to the Tylenol ibuprofen that you have been taking.  Please make sure to follow-up with the primary care doctor for further evaluation and treatment.  Your wet prep today showed that you have something called bacterial vaginosis.  This is not an STD, but is an increase in growth of bacteria.  We are going to prescribe you some oral medications, however because you have a history of an abnormal EKG, we will prescribe you a vaginal cream instead.  Please use this as directed.  Your gonorrhea and Chlamydia tests will be available in the next 24 hours, please download the MyChart app to follow-up on this.  There is instructions on your discharge paperwork on how to download this.  Covid test will also be available on the MyChart app in the next 24 hours.  I provided contact information for [facility] which is a free clinic in the area.  Please call the phone number and schedule an appointment.  Return to the ER for any new or worsening symptoms

## 2020-11-01 NOTE — ED Provider Notes (Signed)
[facility]-EMERGENCY DEPT Provider Note   CSN: [id number] Arrival date & time: 11/01/20  [id number]     History Chief Complaint  Patient presents with  . Abdominal Pain    Ariana Jensen is a 55 y.o. female.  HPI 55 year old female with a history of crack cocaine abuse, depression, fibromyalgia, hepatitis C, hypertension, opioid dependence, seizures, trichomonas presents to the ER with multiple complaints.  Patient complains of constant throbbing bilateral lower abdominal pain x2 weeks which is progressively gotten worse.  She denies any nausea or vomiting.  Last bowel movement was 2 days ago.  Denies any dysuria or hematuria.  No vaginal bleeding or discharge, though she is sexually active with one partner and they do not use the barrier method.  She also complains of left hip pain which has been ongoing for several months, worsening to the point that that now she feels it radiating down the front of her left thigh.  She denies any loss of bowel bladder incontinence, no foot drop, no numbness.  No known injury or falls.  Denies any IV drug use.  She has not taken anything for her symptoms.  Although the patient did not originally endorse shortness of breath, when questioned, she states that she has noticed that she has been more short of breath even at rest over the last few days.  Denies any cough, fevers or chills.  No chest pain.  No pleuritic symptoms.  She is vaccinated for COVID but does not have the booster.  No lower extremity swelling.  She does have a history of smoking.    Past Medical History:  Diagnosis Date  . Anxiety   . ASCUS (atypical squamous cells of undetermined significance) on Pap smear   . Chronic lower back pain   . Cocaine abuse (HCC)    crack cocaine  . Depression   . Fibromyalgia   . GERD (gastroesophageal reflux disease)   . Hepatitis C   . Hepatitis C antibody test positive   . Hypertension   . Hypokalemia 03/04/2018  . Leukocytosis  03/04/2018  . Opioid dependence (HCC)   . PVC's (premature ventricular contractions) 03/04/2018  . Seizures (HCC)    "don't know why; I've had 2 in the past year" (08/18/2016)  . 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  . Trichomonas 04/17/2011   Diagnosed 04/02/11 during hospitalization, treated with Flagyl 2g, patient instructed to have partner treated (must follow-up)   . Tuberculosis    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 age 55, now s/p 1-yr of multidrug treatment (dates unknown)    Patient Active Problem List   Diagnosis Date Noted  . MDD (major depressive disorder), recurrent episode, severe (HCC) 07/14/2018  . Alcohol withdrawal syndrome with complication (HCC)   . Polysubstance dependence (HCC)   . Severe bipolar I disorder, most recent episode depressed (HCC)   . PTSD (post-traumatic stress disorder) 06/22/2018  . Bipolar I disorder, most recent episode depressed, severe without psychotic features (HCC) 06/21/2018  . PVC's (premature ventricular contractions) 03/04/2018  . OD (overdose of drug) 03/04/2018  . Leukocytosis 03/04/2018  . Hypokalemia 03/04/2018  . Acute drug intoxication with complication (HCC)   . Acute urinary retention 02/11/2018  . Acute renal failure (ARF) (HCC) 02/10/2018  . Closed fracture of left distal fibula 02/10/2018  . Withdrawal complaint 05/12/2017  . Symptomatic bradycardia 05/12/2017  . Acute kidney injury (HCC) 08/18/2016  . Boils 08/18/2016  . Rhabdomyolysis 08/18/2016  . Urinary retention 08/18/2016  .  ARF (acute renal failure) (HCC) 08/18/2016  . AKI (acute kidney injury) (HCC) 08/17/2016  . Hallucination 08/17/2016  . Nausea & vomiting 10/04/2015  . Pruritic dermatitis 09/10/2015  . Rash and nonspecific skin eruption 08/21/2015  . Body lice 08/05/2015  . Prolonged QT interval 07/18/2015  . Dysmenorrhea 04/10/2015  . Trichimoniasis 04/10/2015  . Chest pain 03/07/2015  . Healthcare maintenance  03/07/2015  . RLQ abdominal pain 02/18/2012  . GERD (gastroesophageal reflux disease) 01/28/2012  . Menorrhagia 01/25/2012  . Weakness generalized 12/05/2011  . Tobacco abuse 11/06/2011  . HTN (hypertension) 10/22/2011  . Abscess and cellulitis 10/22/2011  . Hepatitis C, chronic (HCC) 10/22/2011  . History of TB (tuberculosis) 10/22/2011  . Opioid use disorder, severe, dependence (HCC)   . Polysubstance abuse (HCC) 04/17/2011    Past Surgical History:  Procedure Laterality Date  . CESAREAN SECTION  1984  . DILATION AND CURETTAGE OF UTERUS    . I & D EXTREMITY  10/18/2011   Procedure: IRRIGATION AND DEBRIDEMENT EXTREMITY;  Surgeon: Sharma Covert, MD;  Location: [facility] OR;  Service: Orthopedics;  Laterality: Left;  . I & D EXTREMITY  10/21/2011   Procedure: IRRIGATION AND DEBRIDEMENT EXTREMITY;  Surgeon: Sharma Covert, MD;  Location: [facility] OR;  Service: Orthopedics;  Laterality: Left;  . SALPINGECTOMY Left March 2010   ectopic pregnancy     OB History   No obstetric history on file.     Family History  Problem Relation Age of Onset  . Heart attack Mother   . Heart attack Father   . Heart attack Paternal Grandmother   . Heart attack Paternal Grandfather   . Cirrhosis Brother     Social History   Tobacco Use  . Smoking status: Current Every Day Smoker    Packs/day: 0.50    Years: 32.00    Pack years: 16.00    Types: Cigarettes  . Smokeless tobacco: Never Used  Vaping Use  . Vaping Use: Never used  Substance Use Topics  . Alcohol use: Yes    Alcohol/week: 0.0 standard drinks    Comment: occ  . Drug use: Yes    Types: Oxycodone, Heroin, Cocaine, IV    Comment: Pt stated "I do heroin"    Home Medications Prior to Admission medications   Medication Sig Start Date End Date Taking? Authorizing Provider  diclofenac Sodium (VOLTAREN) 1 % GEL Apply 2 g topically 4 (four) times daily for 4 days. 11/01/20 11/05/20 Yes Trudee Grip A, PA-C  lidocaine (LIDODERM) 5 % Place 1  patch onto the skin daily. Remove & Discard patch within 12 hours or as directed by MD 11/01/20  Yes Mare Ferrari, PA-C  methadone (DOLOPHINE) 10 MG/ML solution Take 90 mg by mouth daily.   Yes [provider]  metroNIDAZOLE (METROGEL VAGINAL) 0.75 % vaginal gel Place 1 Applicatorful vaginally 2 (two) times daily for 7 days. 11/01/20 11/08/20 Yes Mare Ferrari, PA-C  amLODipine (NORVASC) 10 MG tablet Take 1 tablet (10 mg total) by mouth daily. 07/19/18   Antonieta Pert, MD  divalproex (DEPAKOTE ER) 500 MG 24 hr tablet Take 1 tablet (500 mg total) by mouth every evening. 07/18/18   Antonieta Pert, MD  gabapentin (NEURONTIN) 300 MG capsule Take 1 capsule (300 mg total) by mouth 4 (four) times daily. 07/18/18   Antonieta Pert, MD  hydrOXYzine (ATARAX/VISTARIL) 25 MG tablet Take 1 tablet (25 mg total) by mouth every 6 (six) hours as needed for anxiety. 07/18/18  Antonieta Pert, MD  nicotine (NICODERM CQ - DOSED IN MG/24 HOURS) 21 mg/24hr patch Place 1 patch (21 mg total) onto the skin daily. 07/19/18   Antonieta Pert, MD  pantoprazole (PROTONIX) 20 MG tablet Take 1 tablet (20 mg total) by mouth daily. For acid reflux 06/30/18   Money, Gerlene Burdock, FNP  risperiDONE (RISPERDAL) 1 MG tablet Take 1 tablet (1 mg total) by mouth 2 (two) times daily. 07/18/18   Antonieta Pert, MD  traZODone (DESYREL) 50 MG tablet Take 1 tablet (50 mg total) by mouth at bedtime and may repeat dose one time if needed. 07/18/18   Antonieta Pert, MD    Allergies    Patient has no known allergies.  Review of Systems   Review of Systems  Constitutional: Negative for chills and fever.  HENT: Negative for ear pain and sore throat.   Eyes: Negative for pain and visual disturbance.  Respiratory: Negative for cough and shortness of breath.   Cardiovascular: Negative for chest pain and palpitations.  Gastrointestinal: Positive for abdominal pain. Negative for vomiting.  Genitourinary: Negative  for dysuria and hematuria.  Musculoskeletal: Positive for arthralgias. Negative for back pain.  Skin: Negative for color change and rash.  Neurological: Negative for seizures and syncope.  All other systems reviewed and are negative.   Physical Exam Updated Vital Signs BP (!) 186/95   Pulse (!) 42   Temp 98.1 F (36.7 C) (Oral)   Resp 15   Ht 5\' 3"  (1.6 m)   Wt 56.7 kg   LMP 12/06/2015   SpO2 98%   BMI 22.14 kg/m   Physical Exam Vitals and nursing note reviewed.  Constitutional:      General: She is not in acute distress.    Appearance: She is well-developed and well-nourished. She is not ill-appearing or diaphoretic.  HENT:     Head: Normocephalic and atraumatic.  Eyes:     Conjunctiva/sclera: Conjunctivae normal.  Cardiovascular:     Rate and Rhythm: Normal rate and regular rhythm.     Heart sounds: No murmur heard.   Pulmonary:     Effort: Pulmonary effort is normal. No respiratory distress.     Breath sounds: Normal breath sounds.  Abdominal:     Palpations: Abdomen is soft.     Tenderness: There is abdominal tenderness in the right lower quadrant and left lower quadrant. There is no right CVA tenderness, left CVA tenderness or guarding. Negative signs include McBurney's sign.     Comments: Bilateral lower abdominal tenderness, more focally on the left.  No flank tenderness.  No guarding, peritoneal signs.  Genitourinary:    Vagina: Normal.     Cervix: No cervical motion tenderness or discharge.     Adnexa: Right adnexa normal and left adnexa normal.       Right: No tenderness.         Left: No tenderness.       Comments: Pelvic exam performed with chaperone at bedside.  No significant discharge, bleeding.  No cervical motion tenderness, no adnexal tenderness bilaterally. Musculoskeletal:        General: No edema.     Cervical back: Neck supple.     Comments: 5/5 strength in upper and lower extremities bilaterally.  Passive and active range of motion of the  left hip.  Full range of motion with flexion and extension.  Patient ambulating with no abnormal gait.  Neurovascularly intact.  Skin:    General: Skin is warm  and dry.     Capillary Refill: Capillary refill takes less than 2 seconds.  Neurological:     General: No focal deficit present.     Mental Status: She is alert.  Psychiatric:        Mood and Affect: Mood and affect and mood normal.        Behavior: Behavior normal.     ED Results / Procedures / Treatments   Labs (all labs ordered are listed, but only abnormal results are displayed) Labs Reviewed  WET PREP, GENITAL - Abnormal; Notable for the following components:      Result Value   Clue Cells Wet Prep HPF POC PRESENT (*)    WBC, Wet Prep HPF POC FEW (*)    All other components within normal limits  COMPREHENSIVE METABOLIC PANEL - Abnormal; Notable for the following components:   Sodium 133 (*)    Glucose, Bld 107 (*)    BUN 23 (*)    All other components within normal limits  SARS CORONAVIRUS 2 (TAT 6-24 HRS)  LIPASE, BLOOD  CBC  URINALYSIS, ROUTINE W REFLEX MICROSCOPIC  GC/CHLAMYDIA PROBE AMP ([place]) NOT AT [facility]    EKG EKG Interpretation  Date/Time:  Friday November 01 2020 10:04:31 EST Ventricular Rate:  43 PR Interval:    QRS Duration: 84 QT Interval:  532 QTC Calculation: 450 R Axis:   51 Text Interpretation: Sinus bradycardia Abnormal R-wave progression, early transition Consider left ventricular hypertrophy Nonspecific T abnormalities, lateral leads No significant change since prior 10/19 Confirmed by Meridee Score [id number]) on 11/01/2020 11:15:41 AM   Radiology CT ABDOMEN PELVIS W CONTRAST  Result Date: 11/01/2020 CLINICAL DATA:  "Pt arrived via walk in, c/o lower bilateral abd pain x2 weeks and left thigh pain left leg, that radiates down leg occasionally x3 months. Denies any n/v or diarrhea or urinary sx. EXAM: CT ABDOMEN AND PELVIS WITH CONTRAST TECHNIQUE: Multidetector CT imaging of the  abdomen and pelvis was performed using the standard protocol following bolus administration of intravenous contrast. CONTRAST:  OMNIPAQUE IOHEXOL 300 MG/ML  SOLN COMPARISON:  12/21/2013 FINDINGS: Lower chest: Lung bases show peripheral linear and reticular opacity consistent with a combination chronic interstitial thickening and subsegmental atelectasis. No acute findings. Hepatobiliary: Liver is normal in size and attenuation with no masses or focal lesions. Gallbladder is moderately distended. No wall thickening. No visualized stones. There is intra and extrahepatic bile duct dilation common bile duct measuring a maximum 9 mm some distal tapering. Duct dilation has mildly increased from the prior CT. Pancreas: Unremarkable. No pancreatic ductal dilatation or surrounding inflammatory changes. Spleen: Normal in size without focal abnormality. Adrenals/Urinary Tract: No adrenal masses. Kidneys normal in overall size, orientation and position with symmetric enhancement and excretion. 9 mm cyst versus dilated calyx noted in the upper pole the left kidney. No other renal masses or lesions. No stones. No hydronephrosis. Normal ureters. Normal bladder. Stomach/Bowel: Normal stomach. Small bowel and colon are normal in caliber. No wall thickening. No inflammation. Mild increase in the colonic stool burden. Normal appendix visualized. Vascular/Lymphatic: Aortic atherosclerosis. No aneurysm. No enlarged lymph nodes. Reproductive: Uterus and bilateral adnexa are unremarkable. Other: Small fat containing umbilical hernia.  No ascites. Musculoskeletal: No fracture or bone lesion. IMPRESSION: 1. No acute findings. No findings to account for left lower quadrant abdominal pain. No evidence of diverticulitis. 2. Mild generalized increase in the colonic stool burden. 3. 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 4. Aortic atherosclerosis. Electronically Signed  By: Amie Portland M.D.   On: 11/01/2020 14:04   DG  Chest Portable 1 View  Result Date: 11/01/2020 CLINICAL DATA:  55 year old female with 1 week of shortness of breath. Intermittent left hip pain radiating to the knee for 3 months. Constant pain recently. EXAM: PORTABLE CHEST 1 VIEW COMPARISON:  Portable chest 03/04/2018 and earlier. FINDINGS: Portable AP semi upright view at 1014 hours. Improved lung volumes from 2019. Heart size at the upper limits of normal. Other mediastinal contours are within normal limits. Visualized tracheal air column is within normal limits. No pneumothorax. Allowing for portable technique the lungs are clear. Negative visible bowel gas and osseous structures. IMPRESSION: Negative portable chest. Electronically Signed   By: Odessa Fleming M.D.   On: 11/01/2020 10:33   DG Hip Unilat W or Wo Pelvis 2-3 Views Left  Result Date: 11/01/2020 CLINICAL DATA:  55 year old female with 1 week of shortness of breath. Intermittent left hip pain radiating to the knee for 3 months. Constant pain recently. EXAM: DG HIP (WITH OR WITHOUT PELVIS) 2-3V LEFT COMPARISON:  CT Abdomen and Pelvis 12/21/2013. FINDINGS: Femoral heads are normally located. Bone mineralization is within normal limits. Pelvis appears stable and intact. Incidental phleboliths. Negative lower abdominal visceral contours. Grossly intact proximal right femur. Left femur appears stable and intact. Hip joint spaces appear symmetric, within normal limits. No acute osseous abnormality identified. IMPRESSION: No osseous abnormality identified about the left hip, pelvis. Electronically Signed   By: Odessa Fleming M.D.   On: 11/01/2020 10:35    Procedures Procedures   Medications Ordered in ED Medications  ketorolac (TORADOL) 30 MG/ML injection 30 mg (30 mg Intravenous Given 11/01/20 1229)  amLODipine (NORVASC) tablet 10 mg (10 mg Oral Given 11/01/20 1250)  iohexol (OMNIPAQUE) 300 MG/ML solution 100 mL (100 mLs Intravenous Contrast Given 11/01/20 1331)  dicyclomine (BENTYL) capsule 10 mg (10 mg  Oral Given 11/01/20 1427)    ED Course  I have reviewed the triage vital signs and the nursing notes.  Pertinent labs & imaging results that were available during my care of the patient were reviewed by me and considered in my medical decision making (see chart for details).    MDM Rules/Calculators/A&P                         55 year old female with multiple complaints, including lower abdominal pain, left hip pain and also endorsing shortness of breath  On arrival, the patient is well-appearing, no acute distress, ambulating about the room with no difficulty. Vitals on arrival with a blood pressure of 205/123, bradycardic though this does appear that she may have a history of this in the past chart reviewed.  She is afebrile, not hypoxic or tachypneic.  She states that she has not taken her home morning blood pressure medications this morning Physical exam with bilateral lower abdominal tenderness, no guarding or peritoneal signs.  More focal in the left.  Pelvic exam benign.  Lung sounds clear.  Exam of the left hip with full range of motion, strength, neurovascularly intact.  All labs and imaging ordered, interpreted and aided my medical decision making -CBC without leukocytosis, normal hemoglobin -CMP with mild hyponatremia 133, BUN of 23, no other significant abnormalities noted.  Normal liver and kidney function test. -Lipase normal -UA without evidence of blood or UTI -Wet prep and GC chlamydia pending  MDM:  10:15 AM: Given ongoing abdominal pain with focal tenderness on the left, will order  CT scan to rule out diverticulitis, appendicitis, malignancy, or any other emergency pathology.  She was given her home blood pressure medicines here, as well as Toradol for pain.  We will also add on a chest x-ray given shortness of breath.  EKG pending.  Plain films of the left hip ordered as well, low suspicion for septic joint at this time.  12:30 PM: Wet prep with clue cells and few  WBCs. -Chest x-ray negative -X-ray of the hip with no significant abnormalities -CT of the abdomen with a 9 mm cyst in the upper pole of the kidney.  There is also some mild generalized increase in the colonic stool burden.  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  Discussed these reassuring findings with the patient.  Patient was given her home Norvasc, blood pressure is mildly improved.  Remains bradycardic, chest pain, dizziness, or any other symptoms.  No evidence of heart block on EKG.  Encouraged stool softeners.  Ambulating about the room with no difficulty, no evidence of hypoxia.  Stressed PCP follow-up, will prescribe Derm patch and Voltaren gel.  Given history of long QT, will avoid oral Flagyl and provide MetroGel instead.  She was given follow-up and contact information for [facility].  Discussed return precautions.  She voiced understanding stable.  At the stage in the ED course, patient's medical screening stable for discharge  Discussed case with Dr. Charm Barges who is agreeable to the above plan and disposition.  Final Clinical Impression(s) / ED Diagnoses Final diagnoses:  Constipation, unspecified constipation type  Bacterial vaginosis  Left hip pain    Rx / DC Orders ED Discharge Orders         Ordered    metroNIDAZOLE (METROGEL VAGINAL) 0.75 % vaginal gel  2 times daily        11/01/20 1455    lidocaine (LIDODERM) 5 %  Every 24 hours        11/01/20 1455    diclofenac Sodium (VOLTAREN) 1 % GEL  4 times daily        11/01/20 1455           Leone Brand 11/01/20 1505    Terrilee Files, MD 11/02/20 [phone]

## 2020-11-04 LAB — GC/CHLAMYDIA PROBE AMP (~~LOC~~) NOT AT ARMC
Chlamydia: NEGATIVE
Comment: NEGATIVE
Comment: NORMAL
Neisseria Gonorrhea: NEGATIVE

## 2020-11-27 NOTE — Progress Notes (Signed)
Patient ID: Ariana Jensen, female   DOB: October 05, 1965, 55 y.o.   MRN: [id number]     Destry Bezdek, is a 55 y.o. female  CZY:[id number]  UXN:[id number]  DOB - 27-Jan-1966  Subjective:  Chief Complaint and HPI: Ariana Jensen is a 55 y.o. female here today to establish care and for a follow up visit After ED visit 11/01/2020.  She participates in the methadone clinic for years now and is doing well.  Compliant on amlodipine and bp has still been high at home 150-160/90s.  No HA/CP/dizziness.  Has not been on any meds other than amlodipine and methadone "for a while."  She has taken lisinopril in the past and tolerated it well.    She has been having L hip pain for over a month now.  The pain radiates into her L leg.  No known injury.  No weakness.  Denies back pain  From ED note: Ariana Jensen is a 55 y.o. female.  HPI 55 year old female with a history of crack cocaine abuse, depression, fibromyalgia, hepatitis C, hypertension, opioid dependence, seizures, trichomonas presents to the ER with multiple complaints.  Patient complains of constant throbbing bilateral lower abdominal pain x2 weeks which is progressively gotten worse.  She denies any nausea or vomiting.  Last bowel movement was 2 days ago.  Denies any dysuria or hematuria.  No vaginal bleeding or discharge, though she is sexually active with one partner and they do not use the barrier method.  She also complains of left hip pain which has been ongoing for several months, worsening to the point that that now she feels it radiating down the front of her left thigh.  She denies any loss of bowel bladder incontinence, no foot drop, no numbness.  No known injury or falls.  Denies any IV drug use.  She has not taken anything for her symptoms.  Although the patient did not originally endorse shortness of breath, when questioned, she states that she has noticed that she has been more short of breath even at rest over the last few  days.  Denies any cough, fevers or chills.  No chest pain.  No pleuritic symptoms.  She is vaccinated for COVID but does not have the booster.  No lower extremity swelling.  She does have a history of smoking.  From A/P: 55 year old female with multiple complaints, including lower abdominal pain, left hip pain and also endorsing shortness of breath  On arrival, the patient is well-appearing, no acute distress, ambulating about the room with no difficulty. Vitals on arrival with a blood pressure of 205/123, bradycardic though this does appear that she may have a history of this in the past chart reviewed.  She is afebrile, not hypoxic or tachypneic.  She states that she has not taken her home morning blood pressure medications this morning Physical exam with bilateral lower abdominal tenderness, no guarding or peritoneal signs.  More focal in the left.  Pelvic exam benign.  Lung sounds clear.  Exam of the left hip with full range of motion, strength, neurovascularly intact.  All labs and imaging ordered, interpreted and aided my medical decision making -CBC without leukocytosis, normal hemoglobin -CMP with mild hyponatremia 133, BUN of 23, no other significant abnormalities noted.  Normal liver and kidney function test. -Lipase normal -UA without evidence of blood or UTI -Wet prep and GC chlamydia pending  MDM:  10:15 AM: Given ongoing abdominal pain with focal tenderness on the left, will order CT scan to rule out  diverticulitis, appendicitis, malignancy, or any other emergency pathology.  She was given her home blood pressure medicines here, as well as Toradol for pain.  We will also add on a chest x-ray given shortness of breath.  EKG pending.  Plain films of the left hip ordered as well, low suspicion for septic joint at this time.  12:30 PM: Wet prep with clue cells and few WBCs. -Chest x-ray negative -X-ray of the hip with no significant abnormalities -CT of the abdomen with a 9 mm  cyst in the upper pole of the kidney.  There is also some mild generalized increase in the colonic stool burden.  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  Discussed these reassuring findings with the patient.  Patient was given her home Norvasc, blood pressure is mildly improved.  Remains bradycardic, chest pain, dizziness, or any other symptoms.  No evidence of heart block on EKG.  Encouraged stool softeners.  Ambulating about the room with no difficulty, no evidence of hypoxia.  Stressed PCP follow-up, will prescribe Derm patch and Voltaren gel.  Given history of long QT, will avoid oral Flagyl and provide MetroGel instead.  She was given follow-up and contact information for [facility] health and wellness.  ED/Hospital notes reviewed.    ROS:   Constitutional:  No f/c, No night sweats, No unexplained weight loss. EENT:  No vision changes, No blurry vision, No hearing changes. No mouth, throat, or ear problems.  Respiratory: No cough, No SOB Cardiac: No CP, no palpitations GI:  No abd pain, No N/V/D. GU: No Urinary s/sx Musculoskeletal: L hip pain Neuro: No headache, no dizziness, no motor weakness.  Skin: No rash Endocrine:  No polydipsia. No polyuria.  Psych: Denies SI/HI  No problems updated.  ALLERGIES: No Known Allergies  PAST MEDICAL HISTORY: Past Medical History:  Diagnosis Date   Anxiety    ASCUS (atypical squamous cells of undetermined significance) on Pap smear    Chronic lower back pain    Cocaine abuse (HCC)    crack cocaine   Depression    Fibromyalgia    GERD (gastroesophageal reflux disease)    Hepatitis C    Hepatitis C antibody test positive    Hypertension    Hypokalemia 03/04/2018   Leukocytosis 03/04/2018   Opioid dependence (HCC)    PVC's (premature ventricular contractions) 03/04/2018   Seizures (HCC)    "don't know why; I've had 2 in the past year"  (08/18/2016)   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   Trichomonas 04/17/2011   Diagnosed 04/02/11 during hospitalization, treated with Flagyl 2g, patient instructed to have partner treated (must follow-up)    Tuberculosis    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 age 72, now s/p 1-yr of multidrug treatment (dates unknown)    MEDICATIONS AT HOME: Prior to Admission medications   Medication Sig Start Date End Date Taking? Authorizing Provider  divalproex (DEPAKOTE ER) 500 MG 24 hr tablet Take 1 tablet (500 mg total) by mouth every evening. 07/18/18  Yes Antonieta Pert, MD  lidocaine (LIDODERM) 5 % Place 1 patch onto the skin daily. Remove & Discard patch within 12 hours or as directed by MD 11/01/20  Yes Mare Ferrari, PA-C  lisinopril (ZESTRIL) 10 MG tablet Take 1 tablet (10 mg total) by mouth daily. 11/28/20  Yes Anders Simmonds, PA-C  methadone (DOLOPHINE) 10 MG/ML solution Take 90 mg by mouth daily.  Yes [provider]  nicotine (NICODERM CQ - DOSED IN MG/24 HOURS) 21 mg/24hr patch Place 1 patch (21 mg total) onto the skin daily. 07/19/18  Yes Antonieta Pert, MD  amLODipine (NORVASC) 10 MG tablet Take 1 tablet (10 mg total) by mouth daily. 11/28/20   Anders Simmonds, PA-C  gabapentin (NEURONTIN) 300 MG capsule Take 1 capsule (300 mg total) by mouth 4 (four) times daily. 11/28/20   Anders Simmonds, PA-C     Objective:  EXAM:   Vitals:   11/28/20 1339  BP: (!) 153/75  Pulse: 61  SpO2: 96%  Weight: 138 lb 6.4 oz (62.8 kg)    General appearance : A&OX3. NAD. Non-toxic-appearing HEENT: Atraumatic and Normocephalic.  PERRLA. EOM intact.   Chest/Lungs:  Breathing-non-labored, Good air entry bilaterally, breath sounds normal without rales, rhonchi, or wheezing  CVS: S1 S2 regular, no murmurs, gallops, rubs  L leg/hip-ROM about80% of normal.  No redness or swelling.  Joint/ligaments seem stable Extremities: Bilateral Lower  Ext shows no edema, both legs are warm to touch with = pulse throughout Neurology:  CN II-XII grossly intact, Non focal.   Psych:  TP linear. J/I WNL. Normal speech. Appropriate eye contact and affect.  Skin:  No Rash  Data Review Lab Results  Component Value Date   HGBA1C 5.8 (H) 06/22/2018   HGBA1C 5.7 (H) 02/11/2018   HGBA1C 5.5 03/07/2015     Assessment & Plan   1. Hypertension, unspecified type Uncontrolled-add lisinopril - amLODipine (NORVASC) 10 MG tablet; Take 1 tablet (10 mg total) by mouth daily.  Dispense: 30 tablet; Refill: 3 - lisinopril (ZESTRIL) 10 MG tablet; Take 1 tablet (10 mg total) by mouth daily.  Dispense: 90 tablet; Refill: 3 - Comprehensive metabolic panel  2. Severe bipolar I disorder, most recent episode depressed [facility]) Patient has been ok not on meds.  No SI/HI.  No acute safety issues - Ambulatory referral to Integrated Behavioral Health  3. Hypokalemia  - Comprehensive metabolic panel  4. Hyponatremia - Comprehensive metabolic panel  5. Left hip pain - gabapentin (NEURONTIN) 300 MG capsule; Take 1 capsule (300 mg total) by mouth 4 (four) times daily.  Dispense: 90 capsule; Refill: 3 - Ambulatory referral to Orthopedic Surgery  6. Encounter for examination following treatment at hospital     Patient have been counseled extensively about nutrition and exercise  Return for 2 months-assign PCP.  The patient was given clear instructions to go to ER or return to medical center if symptoms don't improve, worsen or new problems develop. The patient verbalized understanding. The patient was told to call to get lab results if they haven't heard anything in the next week.     Georgian Co, PA-C [facility] [place], [place] [phone]   11/28/2020, 1:55 PM

## 2020-11-28 ENCOUNTER — Encounter: Payer: Self-pay | Admitting: Physician Assistant

## 2020-11-28 ENCOUNTER — Other Ambulatory Visit: Payer: Self-pay

## 2020-11-28 ENCOUNTER — Other Ambulatory Visit: Payer: Self-pay | Admitting: Physician Assistant

## 2020-11-28 ENCOUNTER — Ambulatory Visit: Payer: Self-pay | Attending: Physician Assistant | Admitting: Physician Assistant

## 2020-11-28 VITALS — BP 153/75 | HR 61 | Wt 138.4 lb

## 2020-11-28 DIAGNOSIS — Z09 Encounter for follow-up examination after completed treatment for conditions other than malignant neoplasm: Secondary | ICD-10-CM

## 2020-11-28 DIAGNOSIS — M25552 Pain in left hip: Secondary | ICD-10-CM

## 2020-11-28 DIAGNOSIS — E871 Hypo-osmolality and hyponatremia: Secondary | ICD-10-CM

## 2020-11-28 DIAGNOSIS — F314 Bipolar disorder, current episode depressed, severe, without psychotic features: Secondary | ICD-10-CM

## 2020-11-28 DIAGNOSIS — I10 Essential (primary) hypertension: Secondary | ICD-10-CM

## 2020-11-28 DIAGNOSIS — E876 Hypokalemia: Secondary | ICD-10-CM

## 2020-11-28 MED ORDER — AMLODIPINE BESYLATE 10 MG PO TABS: 10.0000 mg | ORAL_TABLET | Freq: Every day | ORAL | 3 refills | Status: DC

## 2020-11-28 MED ORDER — LISINOPRIL 10 MG PO TABS: 10.0000 mg | ORAL_TABLET | Freq: Every day | ORAL | 3 refills | Status: DC

## 2020-11-28 MED ORDER — GABAPENTIN 300 MG PO CAPS: 300.0000 mg | ORAL_CAPSULE | Freq: Four times a day (QID) | ORAL | 3 refills | Status: DC

## 2020-11-28 MED ORDER — GABAPENTIN 300 MG PO CAPS
300.0000 mg | ORAL_CAPSULE | Freq: Four times a day (QID) | ORAL | 3 refills | Status: DC
Start: 2020-11-28 — End: 2020-11-28

## 2020-11-28 MED FILL — GABAPENTIN 300 MG CAPSULE: 300 | 22 days supply | Qty: 90 | Fill #0

## 2020-11-28 MED FILL — LISINOPRIL 10 MG TABS: 10 | 30 days supply | Qty: 30 | Fill #0

## 2020-11-28 MED FILL — AMLODIPINE BESYLATE 10 MG T: 10 | 30 days supply | Qty: 30 | Fill #0

## 2020-11-28 NOTE — Progress Notes (Signed)
Having left leg pain.

## 2020-11-29 LAB — COMPREHENSIVE METABOLIC PANEL
ALT: 16 IU/L (ref 0–32)
AST: 20 IU/L (ref 0–40)
Albumin/Globulin Ratio: 1.6 (ref 1.2–2.2)
Albumin: 3.9 g/dL (ref 3.8–4.9)
Alkaline Phosphatase: 68 IU/L (ref 44–121)
BUN/Creatinine Ratio: 38 — ABNORMAL HIGH (ref 9–23)
BUN: 25 mg/dL — ABNORMAL HIGH (ref 6–24)
Bilirubin Total: <0.2 mg/dL (ref 0.0–1.2)
CO2: 21 mmol/L (ref 20–29)
Calcium: 9.2 mg/dL (ref 8.7–10.2)
Chloride: 104 mmol/L (ref 96–106)
Creatinine, Ser: 0.66 mg/dL (ref 0.57–1.00)
Globulin, Total: 2.4 g/dL (ref 1.5–4.5)
Glucose: 81 mg/dL (ref 65–99)
Potassium: 4.3 mmol/L (ref 3.5–5.2)
Sodium: 142 mmol/L (ref 134–144)
Total Protein: 6.3 g/dL (ref 6.0–8.5)
eGFR: 105 mL/min/{1.73_m2} (ref >59)

## 2020-12-02 ENCOUNTER — Telehealth: Payer: Self-pay

## 2020-12-02 NOTE — Telephone Encounter (Signed)
Copied from CRM [id number]. Topic: General - Other >> Dec 02, 2020 10:43 AM Ariana Jensen wrote: Reason for CRM: Pt called and is requesting to have information regarding the referral placed for her. Please advise.

## 2020-12-03 NOTE — Telephone Encounter (Signed)
Gm  Mailed a letter to patient on 3/10 informing her that she need to apply for [employer]  to be able to refer her to Orthopedics .   Thank you.

## 2020-12-16 MED FILL — GABAPENTIN 300 MG CAPSULE: 300 | 22 days supply | Qty: 90 | Fill #1

## 2021-01-14 ENCOUNTER — Other Ambulatory Visit: Payer: Self-pay

## 2021-01-14 MED FILL — Gabapentin Cap 300 MG: ORAL | 30 days supply | Qty: 120 | Fill #0 | Status: AC

## 2021-01-23 ENCOUNTER — Ambulatory Visit: Payer: Medicaid Other | Admitting: Critical Care Medicine

## 2021-01-23 NOTE — Progress Notes (Deleted)
Subjective:    Patient ID: Ariana Jensen, female    DOB: 04-21-1966, 55 y.o.   MRN: [id number]  55 y.o.F here to est pcp   01/23/2021 Hx HTN, Hep C, GERD, ETOH, bipolar, PTSD, subst use, QTC prolonged.   Saw [facility] 11/2020 after ED visit 10/2020  Ariana Jensen is a 55 y.o. female here today to establish care and for a follow up visit After ED visit 11/01/2020.  She participates in the methadone clinic for years now and is doing well.  Compliant on amlodipine and bp has still been high at home 150-160/90s.  No HA/CP/dizziness.  Has not been on any meds other than amlodipine and methadone "for a while."  She has taken lisinopril in the past and tolerated it well.    She has been having L hip pain for over a month now.  The pain radiates into her L leg.  No known injury.  No weakness.  Denies back pain  From ED note: Ariana Jensen a 55 y.o.female.  HPI 55 year old female with a history of crack cocaine abuse, depression, fibromyalgia, hepatitis C, hypertension, opioid dependence, seizures, trichomonas presents to the ER with multiple complaints. Patient complains of constant throbbing bilateral lower abdominal pain x2 weeks which is progressively gotten worse. She denies any nausea or vomiting. Last bowel movement was 2 days ago. Denies any dysuria or hematuria. No vaginal bleeding or discharge, though she is sexually active with one partner and they do not use the barrier method. She also complains of left hip pain which has been ongoing for several months, worsening to the point that that now she feels it radiating down the front of her left thigh. She denies any loss of bowel bladder incontinence, no foot drop, no numbness. No known injury or falls. Denies any IV drug use. She has not taken anything for her symptoms.  Although the patient did not originally endorse shortness of breath, when questioned, she states that she has noticed that she has been more short of  breath even at rest over the last few days. Denies any cough, fevers or chills. No chest pain. No pleuritic symptoms. She is vaccinated for COVID but does not have the booster. No lower extremity swelling. She does have a history of smoking.  From A/P: 55 year old female with multiple complaints, including lower abdominal pain, left hip pain and also endorsing shortness of breath  On arrival, the patient is well-appearing, no acute distress, ambulating about the room with no difficulty. Vitals on arrival with a blood pressure of 205/123, bradycardic though this does appear that she may have a history of this in the past chart reviewed. She is afebrile, not hypoxic or tachypneic. She states that she has not taken her home morning blood pressure medications this morning Physical exam with bilateral lower abdominal tenderness, no guarding or peritoneal signs. More focal in the left. Pelvic exam benign. Lung sounds clear. Exam of the left hip with full range of motion, strength, neurovascularly intact.  All labs and imaging ordered, interpreted and aided my medical decision making -CBC without leukocytosis, normal hemoglobin -CMP with mild hyponatremia 133, BUN of 23, no other significant abnormalities noted. Normal liver and kidney function test. -Lipase normal -UA without evidence of blood or UTI -Wet prep and GC chlamydia pending  MDM:  10:15 AM: Given ongoing abdominal pain with focal tenderness on the left, will order CT scan to rule out diverticulitis, appendicitis, malignancy, or any other emergency pathology. She was given her home  blood pressure medicines here, as well as Toradol for pain. We will also add on a chest x-ray given shortness of breath. EKG pending. Plain films of the left hip ordered as well, low suspicion for septic joint at this time.  12:30 PM: Wet prep with clue cells and few WBCs. -Chest x-ray negative -X-ray of the hip with no significant  abnormalities -CT of the abdomen with a 9 mm cyst in the upper pole of the kidney. There is also some mild generalized increase in the colonic stool burden. 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  Discussed these reassuring findings with the patient. Patient was given her home Norvasc, blood pressure is mildly improved. Remains bradycardic, chest pain, dizziness, or any other symptoms. No evidence of heart block on EKG. Encouraged stool softeners. Ambulating about the room with no difficulty, no evidence of hypoxia. Stressed PCP follow-up, will prescribe Derm patch and Voltaren gel. Given history of long QT, will avoid oral Flagyl and provide MetroGel instead. She was given follow-up andcontact information for [facility] health and wellness.  ED/Hospital notes reviewed.   . Hypertension, unspecified type Uncontrolled-add lisinopril - amLODipine (NORVASC) 10 MG tablet; Take 1 tablet (10 mg total) by mouth daily.  Dispense: 30 tablet; Refill: 3 - lisinopril (ZESTRIL) 10 MG tablet; Take 1 tablet (10 mg total) by mouth daily.  Dispense: 90 tablet; Refill: 3 - Comprehensive metabolic panel  2. Severe bipolar I disorder, most recent episode depressed [facility]) Patient has been ok not on meds.  No SI/HI.  No acute safety issues - Ambulatory referral to Integrated Behavioral Health  3. Hypokalemia  - Comprehensive metabolic panel  4. Hyponatremia - Comprehensive metabolic panel  5. Left hip pain - gabapentin (NEURONTIN) 300 MG capsule; Take 1 capsule (300 mg total) by mouth 4 (four) times daily.  Dispense: 90 capsule; Refill: 3 - Ambulatory referral to Orthopedic Surgery  6. Encounter for examination following treatment at hospital      Review of Systems     Objective:   Physical Exam        Assessment & Plan:

## 2021-02-12 ENCOUNTER — Other Ambulatory Visit: Payer: Self-pay

## 2021-02-12 MED FILL — Gabapentin Cap 300 MG: ORAL | 15 days supply | Qty: 60 | Fill #1 | Status: AC

## 2021-02-13 ENCOUNTER — Other Ambulatory Visit: Payer: Self-pay

## 2021-02-13 MED FILL — Lisinopril Tab 10 MG: ORAL | 30 days supply | Qty: 30 | Fill #0 | Status: CN

## 2021-02-20 ENCOUNTER — Other Ambulatory Visit: Payer: Self-pay

## 2021-03-20 ENCOUNTER — Encounter (INDEPENDENT_AMBULATORY_CARE_PROVIDER_SITE_OTHER): Payer: Self-pay

## 2021-03-20 ENCOUNTER — Ambulatory Visit: Payer: Self-pay | Attending: Critical Care Medicine | Admitting: Critical Care Medicine

## 2021-03-20 ENCOUNTER — Encounter: Payer: Self-pay | Admitting: Critical Care Medicine

## 2021-03-20 ENCOUNTER — Other Ambulatory Visit: Payer: Self-pay

## 2021-03-20 VITALS — BP 114/75 | HR 64 | Ht 63.0 in | Wt 134.0 lb

## 2021-03-20 DIAGNOSIS — B182 Chronic viral hepatitis C: Secondary | ICD-10-CM

## 2021-03-20 DIAGNOSIS — I10 Essential (primary) hypertension: Secondary | ICD-10-CM

## 2021-03-20 DIAGNOSIS — R55 Syncope and collapse: Secondary | ICD-10-CM | POA: Insufficient documentation

## 2021-03-20 DIAGNOSIS — I209 Angina pectoris, unspecified: Secondary | ICD-10-CM | POA: Insufficient documentation

## 2021-03-20 DIAGNOSIS — F1920 Other psychoactive substance dependence, uncomplicated: Secondary | ICD-10-CM

## 2021-03-20 DIAGNOSIS — I493 Ventricular premature depolarization: Secondary | ICD-10-CM

## 2021-03-20 DIAGNOSIS — Z0000 Encounter for general adult medical examination without abnormal findings: Secondary | ICD-10-CM

## 2021-03-20 DIAGNOSIS — Z720 Tobacco use: Secondary | ICD-10-CM

## 2021-03-20 DIAGNOSIS — R079 Chest pain, unspecified: Secondary | ICD-10-CM

## 2021-03-20 DIAGNOSIS — M5416 Radiculopathy, lumbar region: Secondary | ICD-10-CM

## 2021-03-20 DIAGNOSIS — F1410 Cocaine abuse, uncomplicated: Secondary | ICD-10-CM | POA: Insufficient documentation

## 2021-03-20 DIAGNOSIS — R9431 Abnormal electrocardiogram [ECG] [EKG]: Secondary | ICD-10-CM

## 2021-03-20 DIAGNOSIS — M25552 Pain in left hip: Secondary | ICD-10-CM | POA: Insufficient documentation

## 2021-03-20 DIAGNOSIS — Z139 Encounter for screening, unspecified: Secondary | ICD-10-CM

## 2021-03-20 DIAGNOSIS — F314 Bipolar disorder, current episode depressed, severe, without psychotic features: Secondary | ICD-10-CM

## 2021-03-20 DIAGNOSIS — F1910 Other psychoactive substance abuse, uncomplicated: Secondary | ICD-10-CM

## 2021-03-20 MED ORDER — GABAPENTIN 300 MG PO CAPS
300.0000 mg | ORAL_CAPSULE | Freq: Four times a day (QID) | ORAL | 1 refills | Status: DC | PRN
  Filled 2021-03-20: qty 90, 23d supply, fill #0
  Filled 2021-04-10: qty 90, 23d supply, fill #1

## 2021-03-20 MED ORDER — LISINOPRIL 10 MG PO TABS
10.0000 mg | ORAL_TABLET | Freq: Every day | ORAL | 3 refills | Status: DC
  Filled 2021-03-20 – 2021-05-15 (×2): qty 30, 30d supply, fill #0

## 2021-03-20 MED ORDER — DIVALPROEX SODIUM ER 500 MG PO TB24
500.0000 mg | ORAL_TABLET | Freq: Every evening | ORAL | 6 refills | Status: DC
  Filled 2021-03-20: qty 30, 30d supply, fill #0

## 2021-03-20 MED ORDER — DIVALPROEX SODIUM ER 500 MG PO TB24: 500.0000 mg | ORAL_TABLET | Freq: Every evening | ORAL | 0 refills | Status: DC

## 2021-03-20 NOTE — Assessment & Plan Note (Signed)
Strongly advised avoidance of cocaine.

## 2021-03-20 NOTE — Assessment & Plan Note (Signed)
Patient recently decreased smoking from 1 pack daily to 10 cigarettes daily. Strongly advised smoking cessation. Can continue to use nicotine gum. Follow up at next visit.

## 2021-03-20 NOTE — Assessment & Plan Note (Signed)
Patient with cocaine use experiencing intermittent chest pain. Strongly advised cocaine avoidance and smoking cessation. Referred to cardiology.  Follow up at next appointment.

## 2021-03-20 NOTE — Progress Notes (Signed)
Pain in abdomen and left leg.

## 2021-03-20 NOTE — Assessment & Plan Note (Signed)
Call the number in the breast and cervical cancer control program to obtain your mammogram and your cervical cancer screening

## 2021-03-20 NOTE — Patient Instructions (Addendum)
Call the number in the breast and cervical cancer control program to obtain your mammogram and your cervical cancer screening  Full set of labs were obtained today  Refills on all your medications sent to our pharmacy  Referral to cardiology will be made  Referral to the hepatitis C clinic will be made   Referral to orthopedic surgery be made  Please get your orange card renewed for the process we described  Please avoid all cocaine this is causing further heart damage  Return to see Dr. Delford Field 2 months and bring your blood pressure meter in with you  We gave you information on how to access mental health resources you should go to the [facility] behavioral health clinic on the second floor at [address] location you need to go Monday through Thursday arrive by 7:30 AM for walk-in care is open access there you will see a medication management provider and therapist  Continue your work at [facility]

## 2021-03-20 NOTE — Assessment & Plan Note (Signed)
Patient with history of chest pain. Had multiple PVCs seen on recent EKG. Referral to cardiology made today.  Strongly advised to avoid cocaine use and continue to avoid all other illicit substances. Follow up at next visit.

## 2021-03-20 NOTE — Assessment & Plan Note (Signed)
Likely cardiac in nature.  Referred to cardiology. Follow up at next visit.

## 2021-03-20 NOTE — Assessment & Plan Note (Signed)
Patient with hypertension well controlled today at 114/75. Continue lisinopril 10 mg daily Patient is no longer taking amlodipine 10 mg daily. This will be discontinued on the patient's medication list. Advised to bring home blood pressure meter to next office visit to make sure the machine is working properly. Follow up at next visit.

## 2021-03-20 NOTE — Assessment & Plan Note (Signed)
Patient with chronic left hip and leg pain which has not improved recently.  Continue gabapentin 300 mg four times for pain as needed.  Resent referral to orthopedics. Follow up at next visit.

## 2021-03-20 NOTE — Assessment & Plan Note (Signed)
Patient with bipolar disorder experiencing some depressive episodes and irritable mood.   Advised to go to [facility] behavioral health clinic for their open access mental health care.  Follow up at next visit.

## 2021-03-20 NOTE — Assessment & Plan Note (Signed)
Patient with diagnosis of chronic hepatitis C. States she has never been treated with medications.  Referred to hepatitis C management clinic today. Follow up at next visit.

## 2021-03-20 NOTE — Assessment & Plan Note (Signed)
Patient with polysubstance abuse being followed at [facility] methadone clinic. Last cocaine use was three weeks ago. Advised avoidance of all illicit substances.  Continue regular follow up with [facility] methadone clinic. Follow up at next visit.

## 2021-03-20 NOTE — Progress Notes (Signed)
New Patient Office Visit  Subjective:  Patient ID: Ariana Jensen, female    DOB: July 28, 1966  Age: 55 y.o. MRN: [id number]  CC:  Chief Complaint  Patient presents with   Abdominal Pain    HPI Ariana Jensen presents to the clinic today to establish care with a primary care provider. The patient has hypertension which is well controlled with a blood pressure on arrival today of 114/75. The patient is prescribed amlodipine 10 mg, though she has not been taking this medication. The patient is also prescribed lisinopril 10 mg which she takes daily. The patient states her at home blood pressure readings at home are usually around 200/100.    The patient also had a recent episode of syncope, which the patient believes was a seizure 1.5 weeks ago. The patient states she lost consciousness when she was speaking with a neighbor. The patient is unsure how long she lost consciousness for. The patient's neighbor told her the patient's eyes rolled back and then she was shaking during the episode. The patient did not bite her tongue or urinate on herself.   The patient has a history of intermittent chest pain. She reports having a few episodes of chest pain over the past few months which she attributed to smoking. The patient has seen cardiology in the past and has had both a echo and a myocardial perfusion scan. The echocardiogram revealed moderate concentric hypertrophy of the left ventricle with vigorous systolic function. Multiple PVCs are noted on a recent EKG the patient underwent.   The patient also has a history of polysubstance abuse. She goes to the [facility] daily for her methadone treatment. The patient also has cocaine abuse and states her last cocaine use was 3 weeks ago. She denies any recent alcohol use or use of any other illicit substances. The patient sees a counselor at [facility] and receives regular drug screening tests at this clinic.  The patient has tobacco use disorder. The  patient has recently decreased her tobacco use from 1 pack daily to 10 cigarettes daily. She has been using nicotine gum which was been helpful.  The patient also has been experiencing chronic left hip and left leg pain. She states the pain sometimes causes weakness in her leg which causes it to give out. The patient is prescribed gabapentin 300 mg four times daily which she takes as needed for pain. She states this is somewhat helpful. She reports she does have degenerative disc disease in her lumbar spine. The patient was referred to orthopedics at her last visit but states she never received a call from them.   The patient has a history of chronic hepatitis C. She states she saw a hepatitis C specialist in the past but was never placed on medication for this condition.   The patient has a history of bipolar disorder for which she takes Depakote 500 mg daily. She has been experiencing periods of depressed mood and irritable mood recently. The patient would like to see a mental health professional to discuss her treatment for this condition. The patient denies any suicidal or homicidal ideation.   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   Trichomonas 04/17/2011   Diagnosed 04/02/11 during hospitalization, treated with Flagyl 2g, patient instructed to have partner treated (must follow-up)    Tuberculosis    Patient  reports contracting disease at age 69, now s/p 1-yr of multidrug treatment (dates unknown)    Past Surgical History:  Procedure Laterality Date   CESAREAN SECTION  1984   DILATION AND CURETTAGE OF UTERUS     I & D EXTREMITY  10/18/2011   Procedure: IRRIGATION AND DEBRIDEMENT EXTREMITY;  Surgeon: Sharma Covert, MD;  Location: [facility] OR;  Service: Orthopedics;  Laterality: Left;   I & D EXTREMITY  10/21/2011   Procedure: IRRIGATION AND DEBRIDEMENT EXTREMITY;  Surgeon: Sharma Covert, MD;  Location: [facility] OR;  Service: Orthopedics;  Laterality: Left;   SALPINGECTOMY Left March 2010   ectopic pregnancy    Family History  Problem Relation Age of Onset   Heart attack Mother    Heart attack Father    Heart attack Paternal Grandmother    Heart attack Paternal Grandfather    Cirrhosis Brother     Social History   Socioeconomic History   Marital status: Widowed    Spouse name: Not on file   Number of children: 1   Years of education: 7th grade   Highest education level: Not on file  Occupational History   Not on file  Tobacco Use   Smoking status: Every Day    Packs/day: 0.50    Years: 32.00    Pack years: 16.00    Types: Cigarettes   Smokeless tobacco: Never  Vaping Use   Vaping Use: Never used  Substance and Sexual Activity   Alcohol use: Yes    Alcohol/week: 0.0 standard drinks    Comment: occ   Drug use: Yes    Types: Oxycodone, Heroin, Cocaine, IV    Comment: Pt stated "I do heroin"   Sexual activity: Not Currently    Birth control/protection: None  Other Topics Concern   Not on file  Social History Narrative   Lives in [place]   Currently Unemployeed   Social Determinants of Health   Financial Resource Strain: Not on file  Food Insecurity: Not on file  Transportation Needs: Not on file  Physical Activity: Not on file  Stress: Not on file  Social Connections: Not on file  Intimate Partner Violence: Not on file    ROS Review of Systems  Constitutional:   Negative for chills and fever.  HENT:  Negative for congestion and rhinorrhea.   Eyes:  Negative for pain.  Respiratory:  Positive for shortness of breath. Negative for cough.   Cardiovascular:  Positive for chest pain.  Gastrointestinal:  Negative for abdominal pain, nausea and vomiting.  Endocrine: Negative.   Genitourinary:  Negative for difficulty urinating and dyspareunia.  Musculoskeletal:  Positive for back pain.       Left leg pain   Skin:  Negative for rash.  Neurological:  Positive for syncope. Negative for headaches.  Psychiatric/Behavioral:  Positive for dysphoric mood. Negative for self-injury and suicidal ideas.    Objective:   Today's Vitals: BP 114/75   Pulse 64   Ht  5\' 3"  (1.6 m)   Wt 134 lb (60.8 kg)   LMP 12/06/2015   SpO2 94%   BMI 23.74 kg/m   Physical Exam Vitals and nursing note reviewed.  Constitutional:      General: She is not in acute distress. HENT:     Head: Normocephalic and atraumatic.     Mouth/Throat:     Mouth: Mucous membranes are moist.     Pharynx: Oropharynx is clear.  Eyes:     Conjunctiva/sclera: Conjunctivae normal.  Cardiovascular:     Rate and Rhythm: Normal rate and regular rhythm.     Heart sounds: Normal heart sounds. No murmur heard.   No friction rub. No gallop.  Pulmonary:     Effort: Pulmonary effort is normal. No respiratory distress.     Breath sounds: Normal breath sounds. No wheezing.  Abdominal:     General: There is no distension.     Palpations: Abdomen is soft.     Tenderness: There is no abdominal tenderness.  Musculoskeletal:        General: Normal range of motion.     Cervical back: Normal range of motion.     Comments: Tenderness to palpation along the anterior aspect of the left hip and anterior aspect of the left knee  Skin:    General: Skin is warm.  Neurological:     Mental Status: She is alert and oriented to person, place, and time.     Motor: No weakness.  Psychiatric:        Behavior:  Behavior normal.    Assessment & Plan:   Problem List Items Addressed This Visit       Cardiovascular and Mediastinum   HTN (hypertension)    Patient with hypertension well controlled today at 114/75. Continue lisinopril 10 mg daily Patient is no longer taking amlodipine 10 mg daily. This will be discontinued on the patient's medication list. Advised to bring home blood pressure meter to next office visit to make sure the machine is working properly. Follow up at next visit.        Relevant Medications   lisinopril (ZESTRIL) 10 MG tablet   Other Relevant Orders   Comprehensive metabolic panel   CBC with Differential/Platelet   Thyroid Panel With TSH   PVC's (premature ventricular contractions)    Patient with history of chest pain. Had multiple PVCs seen on recent EKG. Referral to cardiology made today.  Strongly advised to avoid cocaine use and continue to avoid all other illicit substances. Follow up at next visit.        Relevant Medications   lisinopril (ZESTRIL) 10 MG tablet   Other Relevant Orders   Ambulatory referral to Cardiology     Digestive   Hepatitis C, chronic (HCC)    Patient with diagnosis of chronic hepatitis C. States she has never been treated with medications.  Referred to hepatitis C management clinic today. Follow up at next visit.        Relevant Orders   HCV Ab w Reflex to Quant PCR   AMB referral to hepatitis C clinic     Other   Polysubstance abuse [facility](HCC)    Patient with polysubstance abuse being followed at [facility] meth[facility]one clinic. Last cocaine use was three weeks ago. Advised avoidance of all illicit substances.  Continue regular follow up with [facility] meth[facility]one clinic. Follow up at next visit.        Relevant Orders   Ambulatory referral to Cardiology   Comprehensive  metabolic panel   Tobacco abuse    Patient recently decreased smoking from 1 pack daily to 10 cigarettes daily. Strongly advised smoking cessation. Can continue to use  nicotine gum. Follow up at next visit.        Chest pain    Patient with cocaine use experiencing intermittent chest pain. Strongly advised cocaine avoidance and smoking cessation. Referred to cardiology.  Follow up at next appointment.        Healthcare maintenance    Call the number in the breast and cervical cancer control program to obtain your mammogram and your cervical cancer screening         Prolonged QT interval   Relevant Orders   Ambulatory referral to Cardiology   Bipolar I disorder, most recent episode depressed, severe without psychotic features [facility])    Patient with bipolar disorder experiencing some depressive episodes and irritable mood.  Advised to go to [facility] behavioral health clinic for their open access mental health care. Follow up at next visit.          Polysubstance dependence (HCC)   Ischemic chest pain (HCC) - Primary   Relevant Orders   Ambulatory referral to Cardiology   Cocaine abuse (HCC)    Strongly advised avoidance of cocaine.         Left hip pain    Patient with chronic left hip and leg pain which has not improved recently.  Continue gabapentin 300 mg four times for pain as needed.  Resent referral to orthopedics. Follow up at next visit.        Syncope    Likely cardiac in nature.  Referred to cardiology. Follow up at next visit.        Relevant Orders   Ambulatory referral to Cardiology   Other Visit Diagnoses     Lumbar radiculopathy       Relevant Medications   gabapentin (NEURONTIN) 300 MG capsule   divalproex (DEPAKOTE ER) 500 MG 24 hr tablet   Other Relevant Orders   Ambulatory referral to Orthopedic Surgery   Encounter for health-related screening       Relevant Orders   Hemoglobin A1c   Lipid panel       Outpatient Encounter Medications as of 03/20/2021  Medication Sig   methadone (DOLOPHINE) 10 MG/ML solution Take 90 mg by mouth daily.   [DISCONTINUED] divalproex (DEPAKOTE ER) 500 MG  24 hr tablet Take 1 tablet (500 mg total) by mouth every evening.   [DISCONTINUED] gabapentin (NEURONTIN) 300 MG capsule Take 1 capsule (300 mg total) by mouth 4 (four) times daily.   [DISCONTINUED] gabapentin (NEURONTIN) 300 MG capsule TAKE 1 CAPSULE (300 MG TOTAL) BY MOUTH 4 (FOUR) TIMES DAILY. (Patient taking differently: 4 (four) times daily as needed.)   [DISCONTINUED] lisinopril (ZESTRIL) 10 MG tablet Take 1 tablet (10 mg total) by mouth daily.   [DISCONTINUED] lisinopril (ZESTRIL) 10 MG tablet TAKE 1 TABLET (10 MG TOTAL) BY MOUTH DAILY.   divalproex (DEPAKOTE ER) 500 MG 24 hr tablet Take 1 tablet (500 mg total) by mouth every evening.   gabapentin (NEURONTIN) 300 MG capsule Take 1 capsule (300 mg total) by mouth 4 (four) times daily as needed.   lisinopril (ZESTRIL) 10 MG tablet TAKE 1 TABLET (10 MG TOTAL) BY MOUTH DAILY.   [DISCONTINUED] amLODipine (NORVASC) 10 MG tablet Take 1 tablet (10 mg total) by mouth daily. (Patient not taking: Reported on 03/20/2021)   [DISCONTINUED] amLODipine (NORVASC) 10 MG tablet TAKE 1 TABLET (  10 MG TOTAL) BY MOUTH DAILY. (Patient not taking: Reported on 03/20/2021)   [DISCONTINUED] divalproex (DEPAKOTE ER) 500 MG 24 hr tablet Take 1 tablet (500 mg total) by mouth every evening.   [DISCONTINUED] lidocaine (LIDODERM) 5 % Place 1 patch onto the skin daily. Remove & Discard patch within 12 hours or as directed by MD (Patient not taking: Reported on 03/20/2021)   [DISCONTINUED] nicotine (NICODERM CQ - DOSED IN MG/24 HOURS) 21 mg/24hr patch Place 1 patch (21 mg total) onto the skin daily. (Patient not taking: Reported on 03/20/2021)   No facility-administered encounter medications on file as of 03/20/2021.   48 min of care provided: review of prior notes, performing hx/px, formulating complex med decision making Follow-up: Return in about 2 months (around 05/20/2021).   Shan Levans, MD

## 2021-03-25 LAB — CBC WITH DIFFERENTIAL/PLATELET
Basophils Absolute: 0.1 10*3/uL (ref 0.0–0.2)
Basos: 1 %
EOS (ABSOLUTE): 0.2 10*3/uL (ref 0.0–0.4)
Eos: 3 %
Hematocrit: 38.9 % (ref 34.0–46.6)
Hemoglobin: 12.8 g/dL (ref 11.1–15.9)
Immature Grans (Abs): 0 10*3/uL (ref 0.0–0.1)
Immature Granulocytes: 0 %
Lymphocytes Absolute: 2.1 10*3/uL (ref 0.7–3.1)
Lymphs: 29 %
MCH: 30.8 pg (ref 26.6–33.0)
MCHC: 32.9 g/dL (ref 31.5–35.7)
MCV: 94 fL (ref 79–97)
Monocytes Absolute: 0.4 10*3/uL (ref 0.1–0.9)
Monocytes: 6 %
Neutrophils Absolute: 4.5 10*3/uL (ref 1.4–7.0)
Neutrophils: 61 %
Platelets: 228 10*3/uL (ref 150–450)
RBC: 4.15 x10E6/uL (ref 3.77–5.28)
RDW: 12.6 % (ref 11.7–15.4)
WBC: 7.2 10*3/uL (ref 3.4–10.8)

## 2021-03-25 LAB — COMPREHENSIVE METABOLIC PANEL
ALT: 14 IU/L (ref 0–32)
AST: 19 IU/L (ref 0–40)
Albumin/Globulin Ratio: 1.4 (ref 1.2–2.2)
Albumin: 4 g/dL (ref 3.8–4.9)
Alkaline Phosphatase: 69 IU/L (ref 44–121)
BUN/Creatinine Ratio: 23 (ref 9–23)
BUN: 24 mg/dL (ref 6–24)
Bilirubin Total: <0.2 mg/dL (ref 0.0–1.2)
CO2: 25 mmol/L (ref 20–29)
Calcium: 9.1 mg/dL (ref 8.7–10.2)
Chloride: 103 mmol/L (ref 96–106)
Creatinine, Ser: 1.06 mg/dL — ABNORMAL HIGH (ref 0.57–1.00)
Globulin, Total: 2.8 g/dL (ref 1.5–4.5)
Glucose: 70 mg/dL (ref 65–99)
Potassium: 4.5 mmol/L (ref 3.5–5.2)
Sodium: 140 mmol/L (ref 134–144)
Total Protein: 6.8 g/dL (ref 6.0–8.5)
eGFR: 63 mL/min/{1.73_m2} (ref >59)

## 2021-03-25 LAB — HCV RT-PCR, QUANT (NON-GRAPH)

## 2021-03-25 LAB — LIPID PANEL
Chol/HDL Ratio: 3.8 ratio (ref 0.0–4.4)
Cholesterol, Total: 193 mg/dL (ref 100–199)
HDL: 51 mg/dL (ref >39)
LDL Chol Calc (NIH): 109 mg/dL — ABNORMAL HIGH (ref 0–99)
Triglycerides: 193 mg/dL — ABNORMAL HIGH (ref 0–149)
VLDL Cholesterol Cal: 33 mg/dL (ref 5–40)

## 2021-03-25 LAB — HEMOGLOBIN A1C
Est. average glucose Bld gHb Est-mCnc: 111 mg/dL
Hgb A1c MFr Bld: 5.5 % (ref 4.8–5.6)

## 2021-03-25 LAB — HCV AB W REFLEX TO QUANT PCR: HCV Ab: >11 s/co ratio — ABNORMAL HIGH (ref 0.0–0.9)

## 2021-03-25 LAB — THYROID PANEL WITH TSH
Free Thyroxine Index: 2.3 (ref 1.2–4.9)
T3 Uptake Ratio: 24 % (ref 24–39)
T4, Total: 9.7 ug/dL (ref 4.5–12.0)
TSH: 1.82 u[IU]/mL (ref 0.450–4.500)

## 2021-03-27 ENCOUNTER — Other Ambulatory Visit: Payer: Self-pay

## 2021-04-07 ENCOUNTER — Ambulatory Visit (INDEPENDENT_AMBULATORY_CARE_PROVIDER_SITE_OTHER): Payer: Self-pay | Admitting: Family

## 2021-04-07 ENCOUNTER — Other Ambulatory Visit (HOSPITAL_COMMUNITY): Payer: Self-pay

## 2021-04-07 ENCOUNTER — Encounter: Payer: Self-pay | Admitting: Family

## 2021-04-07 ENCOUNTER — Other Ambulatory Visit: Payer: Self-pay

## 2021-04-07 ENCOUNTER — Telehealth: Payer: Self-pay

## 2021-04-07 VITALS — BP 138/96 | HR 59 | Temp 97.9°F | Resp 16 | Ht 63.0 in | Wt 134.5 lb

## 2021-04-07 DIAGNOSIS — B182 Chronic viral hepatitis C: Secondary | ICD-10-CM

## 2021-04-07 NOTE — Progress Notes (Signed)
Subjective:    Patient ID: Ariana Jensen, female    DOB: [birth year], 55 y.o.   MRN: [id number]  Chief Complaint  Patient presents with   New Patient (Initial Visit)    Hep c     HPI:  Ariana Jensen is a 55 y.o. female with previous medical history of polysubstance use, bipolar disorder, history of tuberculosis, and opioid use disorder presenting today for evaluation and treatment of hepatitis C.  Ariana Jensen was recently seen in her primary care office on 03/20/2021 with history of chronic hepatitis C and without treatment.  Testing revealed positive hepatitis C antibody and unfortunately the hepatitis C RNA level was canceled.  Ariana Jensen was initially diagnosed with hepatitis C approximately 4 to 5 years ago with risk factors including IV drug use and a homemade tattoo.  Ariana Jensen was previously seen by a provider for hepatitis C but never received treatment.  No current symptoms and denies abdominal pain, nausea, vomiting, diarrhea, scleral icterus, or jaundice.  No personal or family history of liver disease.  Drinks alcohol on occasion with no current recreational or illicit drug use and smokes approximately 1/2 pack of cigarettes per day.  No Known Allergies    Outpatient Medications Prior to Visit  Medication Sig Dispense Refill   divalproex (DEPAKOTE ER) 500 MG 24 hr tablet Take 1 tablet (500 mg total) by mouth every evening. 30 tablet 6   gabapentin (NEURONTIN) 300 MG capsule Take 1 capsule (300 mg total) by mouth 4 (four) times daily as needed. 90 capsule 1   lisinopril (ZESTRIL) 10 MG tablet TAKE 1 TABLET (10 MG TOTAL) BY MOUTH DAILY. 90 tablet 3   methadone (DOLOPHINE) 10 MG/ML solution Take 90 mg by mouth daily.     No facility-administered medications prior to visit.     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   Trichomonas 04/17/2011   Diagnosed 04/02/11 during hospitalization, treated with Flagyl 2g, patient instructed to have partner treated (must follow-up)    Tuberculosis    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 age 13, now s/p 1-yr of multidrug treatment (dates unknown)      Past Surgical History:  Procedure Laterality Date   CESAREAN SECTION  1984   DILATION AND CURETTAGE OF UTERUS     I & D EXTREMITY  10/18/2011   Procedure: IRRIGATION AND DEBRIDEMENT EXTREMITY;  Surgeon: Sharma Covert, MD;  Location: [facility] OR;  Service: Orthopedics;  Laterality: Left;   I & D EXTREMITY  10/21/2011   Procedure: IRRIGATION AND DEBRIDEMENT EXTREMITY;  Surgeon: Sharma Covert, MD;  Location: [facility] OR;  Service: Orthopedics;  Laterality: Left;   SALPINGECTOMY Left March 2010   ectopic pregnancy      Family History  Problem Relation Age of Onset   Heart attack Mother    Heart  attack Father    Heart attack Paternal Grandmother    Heart attack Paternal Grandfather    Cirrhosis Brother       Social History   Socioeconomic History   Marital status: Widowed    Spouse name: Not on file   Number of children: 1   Years of education: 7th grade   Highest education level: Not on file  Occupational History   Not on file  Tobacco Use   Smoking status: Every Day    Packs/day: 0.50    Years: 32.00    Pack years: 16.00    Types: Cigarettes   Smokeless tobacco: Never  Vaping Use   Vaping Use: Never used  Substance and Sexual Activity   Alcohol use: Yes     Alcohol/week: 0.0 standard drinks    Comment: occ   Drug use: Yes    Types: Oxycodone, Heroin, Cocaine, IV    Comment: Pt stated "I do heroin"   Sexual activity: Not Currently    Birth control/protection: None  Other Topics Concern   Not on file  Social History Narrative   Lives in [place]   Currently Unemployeed   Social Determinants of Health   Financial Resource Strain: Not on file  Food Insecurity: Not on file  Transportation Needs: Not on file  Physical Activity: Not on file  Stress: Not on file  Social Connections: Not on file  Intimate Partner Violence: Not on file      Review of Systems  Constitutional:  Negative for chills, fatigue, fever and unexpected weight change.  Respiratory:  Negative for cough, chest tightness, shortness of breath and wheezing.   Cardiovascular:  Negative for chest pain and leg swelling.  Gastrointestinal:  Negative for abdominal distention, constipation, diarrhea, nausea and vomiting.  Neurological:  Negative for dizziness, weakness, light-headedness and headaches.  Hematological:  Does not bruise/bleed easily.      Objective:    BP (!) 138/96   Pulse (!) 59   Temp 97.9 F (36.6 C)   Resp 16   Ht 5\' 3"  (1.6 m)   Wt 134 lb 8 oz (61 kg)   LMP 12/06/2015   SpO2 94%   BMI 23.83 kg/m  Nursing note and vital signs reviewed.  Physical Exam Constitutional:      General: Ariana Jensen is not in acute distress.    Appearance: Ariana Jensen is well-developed.  Cardiovascular:     Rate and Rhythm: Normal rate and regular rhythm.     Heart sounds: Normal heart sounds. No murmur heard.   No friction rub. No gallop.  Pulmonary:     Effort: Pulmonary effort is normal. No respiratory distress.     Breath sounds: Normal breath sounds. No wheezing or rales.  Chest:     Chest wall: No tenderness.  Abdominal:     General: Bowel sounds are normal. There is no distension.     Palpations: Abdomen is soft. There is no mass.     Tenderness: There is no  abdominal tenderness. There is no guarding or rebound.  Skin:    General: Skin is warm and dry.  Neurological:     Mental Status: Ariana Jensen is alert and oriented to person, place, and time.  Psychiatric:        Behavior: Behavior normal.        Thought Content: Thought content normal.        Judgment: Judgment normal.        Assessment & Plan:   Patient Active Problem  List   Diagnosis Date Noted   Ischemic chest pain (HCC) 03/20/2021   Cocaine abuse (HCC) 03/20/2021   Left hip pain 03/20/2021   Syncope 03/20/2021   MDD (major depressive disorder), recurrent episode, severe (HCC) 07/14/2018   Alcohol withdrawal syndrome with complication (HCC)    Polysubstance dependence (HCC)    Severe bipolar I disorder, most recent episode depressed (HCC)    PTSD (post-traumatic stress disorder) 06/22/2018   Bipolar I disorder, most recent episode depressed, severe without psychotic features (HCC) 06/21/2018   PVC's (premature ventricular contractions) 03/04/2018   OD (overdose of drug) 03/04/2018   Leukocytosis 03/04/2018   Hypokalemia 03/04/2018   Acute drug intoxication with complication (HCC)    Acute urinary retention 02/11/2018   Acute renal failure (ARF) (HCC) 02/10/2018   Closed fracture of left distal fibula 02/10/2018   Withdrawal complaint 05/12/2017   Symptomatic bradycardia 05/12/2017   Acute kidney injury (HCC) 08/18/2016   Boils 08/18/2016   Rhabdomyolysis 08/18/2016   Urinary retention 08/18/2016   ARF (acute renal failure) (HCC) 08/18/2016   AKI (acute kidney injury) (HCC) 08/17/2016   Hallucination 08/17/2016   Nausea & vomiting 10/04/2015   Pruritic dermatitis 09/10/2015   Rash and nonspecific skin eruption 82/42/3536   Body lice 08/05/2015   Prolonged QT interval 07/18/2015   Dysmenorrhea 04/10/2015   Trichimoniasis 04/10/2015   Chest pain 03/07/2015   Healthcare maintenance 03/07/2015   RLQ abdominal pain 02/18/2012   GERD (gastroesophageal reflux disease)  01/28/2012   Menorrhagia 01/25/2012   Weakness generalized 12/05/2011   Tobacco abuse 11/06/2011   HTN (hypertension) 10/22/2011   Abscess and cellulitis 10/22/2011   Hepatitis C, chronic (HCC) 10/22/2011   History of TB (tuberculosis) 10/22/2011   Opioid use disorder, severe, dependence (HCC)    Polysubstance abuse (HCC) 04/17/2011     Problem List Items Addressed This Visit       Digestive   Hepatitis C, chronic (HCC) - Primary    Ariana Jensen is a 55 year old female with positive hepatitis C antibody with risk factors for hepatitis C include IV drug use and homemade tattoos.  Ariana Jensen is treatment nave and currently asymptomatic.  We reviewed the pathophysiology, risks of left untreated, treatment options, and screening processes for hepatitis C.  Check blood work today.  Ariana Jensen is uninsured and will require financial assistance for medicine, lab work, and office visits.  Plan for treatment pending blood work results and schedule follow-up 1 month after start of medication.       Relevant Orders   Hepatic function panel   Hepatitis C genotype   Hepatitis C RNA quantitative   Liver Fibrosis, FibroTest-ActiTest   Protime-INR   Hepatitis B surface antibody,qualitative   Hepatitis B surface antigen   HIV Antibody (routine testing w rflx)     I am having Ariana Jensen maintain her methadone, gabapentin, lisinopril, and divalproex.   Follow-up: 1 month after starting medication.     Marcos Eke, MSN, FNP-C Nurse Practitioner [facility] Main number: [phone]

## 2021-04-07 NOTE — Telephone Encounter (Signed)
[facility] Patient Advocate Encounter ? ?Insurance verification completed.   ? ?The patient is uninsured and will need patient assistance for medication. ? ?We can complete the application and will need to meet with the patient for signatures and income documentation. ? ?John Vasconcelos, CPhT ?Specialty Pharmacy Patient Advocate ?[facility] ?Phone: [phone] ?Fax:  [phone]  ?

## 2021-04-07 NOTE — Patient Instructions (Addendum)
Nice to see you.   We will check your lab work today.  Plan for follow up in 1 month after starting medication.   Have a great day!  Limit acetaminophen (Tylenol) usage to no more than 2 grams (2,000 mg) per day.  Avoid alcohol.  Do not share toothbrushes or razors.  Practice safe sex to protect against transmission as well as sexually transmitted disease.    Hepatitis C Hepatitis C is a viral infection of the liver. It can lead to scarring of the liver (cirrhosis), liver failure, or liver cancer. Hepatitis C may go undetected for months or years because people with the infection may not have symptoms, or they may have only mild symptoms. What are the causes? This condition is caused by the hepatitis C virus (HCV). The virus can spread from person to person (is contagious) through: Blood. Childbirth. A woman who has hepatitis C can pass it to her baby during birth. Bodily fluids, such as breast milk, tears, semen, vaginal fluids, and saliva. Blood transfusions or organ transplants done in the [place] before 1992.  What increases the risk? The following factors may make you more likely to develop this condition: Having contact with unclean (contaminated) needles or syringes. This may result from: Acupuncture. Tattoing. Body piercing. Injecting drugs. Having unprotected sex with someone who is infected. Needing treatment to filter your blood (kidney dialysis). Having HIV (human immunodeficiency virus) or AIDS (acquired immunodeficiency syndrome). Working in a job that involves contact with blood or bodily fluids, such as health care.  What are the signs or symptoms? Symptoms of this condition include: Fatigue. Loss of appetite. Nausea. Vomiting. Abdominal pain. Dark yellow urine. Yellowish skin and eyes (jaundice). Itchy skin. Clay-colored bowel movements. Joint pain. Bleeding and bruising easily. Fluid building up in your stomach (ascites).  In some cases,  you may not have any symptoms. How is this diagnosed? This condition is diagnosed with: Blood tests. Other tests to check how well your liver is functioning. They may include: Magnetic resonance elastography (MRE). This imaging test uses MRIs and sound waves to measure liver stiffness. Transient elastography. This imaging test uses ultrasounds to measure liver stiffness. Liver biopsy. This test requires taking a small tissue sample from your liver to examine it under a microscope.  How is this treated? Your health care provider may perform noninvasive tests or a liver biopsy to help decide the best course of treatment. Treatment may include: Antiviral medicines and other medicines. Follow-up treatments every 6-12 months for infections or other liver conditions. Receiving a donated liver (liver transplant).  Follow these instructions at home: Medicines Take over-the-counter and prescription medicines only as told by your health care provider. Take your antiviral medicine as told by your health care provider. Do not stop taking the antiviral even if you start to feel better. Do not take any medicines unless approved by your health care provider, including over-the-counter medicines and birth control pills. Activity Rest as needed. Do not have sex unless approved by your health care provider. Ask your health care provider when you may return to school or work. Eating and drinking Eat a balanced diet with plenty of fruits and vegetables, whole grains, and lowfat (lean) meats or non-meat proteins (such as beans or tofu). Drink enough fluids to keep your urine clear or pale yellow. Do not drink alcohol. General instructions Do not share toothbrushes, nail clippers, or razors. Wash your hands frequently with soap and water. If soap and water are not available, use  hand sanitizer. Cover any cuts or open sores on your skin to prevent spreading the virus. Keep all follow-up visits as told by  your health care provider. This is important. You may need follow-up visits every 6-12 months. How is this prevented? There is no vaccine for hepatitis C. The only way to prevent the disease is to reduce the risk of exposure to the virus. Make sure you: Wash your hands frequently with soap and water. If soap and water are not available, use hand sanitizer. Do not share needles or syringes. Practice safe sex and use condoms. Avoid handling blood or bodily fluids without gloves or other protection. Avoid getting tattoos or piercings in shops or other locations that are not clean.  Contact a health care provider if: You have a fever. You develop abdominal pain. You pass dark urine. You pass clay-colored stools. You develop joint pain. Get help right away if: You have increasing fatigue or weakness. You lose your appetite. You cannot eat or drink without vomiting. You develop jaundice or your jaundice gets worse. You bruise or bleed easily. Summary Hepatitis C is a viral infection of the liver. It can lead to scarring of the liver (cirrhosis), liver failure, or liver cancer. The hepatitis C virus (HCV) causes this condition. The virus can pass from person to person (is contagious). You should not take any medicines unless approved by your health care provider. This includes over-the-counter medicines and birth control pills. This information is not intended to replace advice given to you by your health care provider. Make sure you discuss any questions you have with your health care provider. Document Released: 09/04/2000 Document Revised: 10/13/2016 Document Reviewed: 10/13/2016 Elsevier Interactive Patient Education  [employer].

## 2021-04-07 NOTE — Assessment & Plan Note (Signed)
Ariana Jensen is a 55 year old female with positive hepatitis C antibody with risk factors for hepatitis C include IV drug use and homemade tattoos.  She is treatment nave and currently asymptomatic.  We reviewed the pathophysiology, risks of left untreated, treatment options, and screening processes for hepatitis C.  Check blood work today.  She is uninsured and will require financial assistance for medicine, lab work, and office visits.  Plan for treatment pending blood work results and schedule follow-up 1 month after start of medication.

## 2021-04-10 ENCOUNTER — Other Ambulatory Visit: Payer: Self-pay

## 2021-04-11 ENCOUNTER — Telehealth: Payer: Self-pay | Admitting: Family

## 2021-04-11 ENCOUNTER — Other Ambulatory Visit (HOSPITAL_COMMUNITY): Payer: Self-pay

## 2021-04-11 LAB — LIVER FIBROSIS, FIBROTEST-ACTITEST
ALT: 12 U/L (ref 6–29)
Alpha-2-Macroglobulin: 295 mg/dL — ABNORMAL HIGH (ref 106–279)
Apolipoprotein A1: 164 mg/dL (ref 101–198)
Bilirubin: 0.3 mg/dL (ref 0.2–1.2)
Fibrosis Score: 0.16
GGT: 16 U/L (ref 3–70)
Haptoglobin: 146 mg/dL (ref 43–212)
Necroinflammat ACT Score: 0.03
Reference ID: 3953015

## 2021-04-11 LAB — HEPATITIS C GENOTYPE

## 2021-04-11 LAB — PROTIME-INR
INR: 0.9
Prothrombin Time: 9.6 s (ref 9.0–11.5)

## 2021-04-11 LAB — HEPATIC FUNCTION PANEL
AG Ratio: 1.4 (calc) (ref 1.0–2.5)
ALT: 12 U/L (ref 6–29)
AST: 18 U/L (ref 10–35)
Albumin: 4.2 g/dL (ref 3.6–5.1)
Alkaline phosphatase (APISO): 55 U/L (ref 37–153)
Bilirubin, Direct: 0.1 mg/dL (ref 0.0–0.2)
Globulin: 2.9 g/dL (calc) (ref 1.9–3.7)
Indirect Bilirubin: 0.2 mg/dL (calc) (ref 0.2–1.2)
Total Bilirubin: 0.3 mg/dL (ref 0.2–1.2)
Total Protein: 7.1 g/dL (ref 6.1–8.1)

## 2021-04-11 LAB — HEPATITIS C RNA QUANTITATIVE
HCV Quantitative Log: 5.84 log IU/mL — ABNORMAL HIGH
HCV RNA, PCR, QN: 689000 IU/mL — ABNORMAL HIGH

## 2021-04-11 LAB — HEPATITIS B SURFACE ANTIGEN: Hepatitis B Surface Ag: NONREACTIVE

## 2021-04-11 LAB — HIV ANTIBODY (ROUTINE TESTING W REFLEX): HIV 1&2 Ab, 4th Generation: NONREACTIVE

## 2021-04-11 LAB — HEPATITIS B SURFACE ANTIBODY,QUALITATIVE: Hep B S Ab: REACTIVE — AB

## 2021-04-11 NOTE — Telephone Encounter (Signed)
Spoke with Ariana Jensen regarding her Hepatitis C results. She has Genotype 1a chronic hepatitis C with initial viral load of  689,000 and fibrosis score of F0. Discussed plan of care to include starting Mavyret for 8 weeks. Plan for follow up in 1 month after starting medication.   Marcos Eke, NP 04/11/2021 12:41 PM

## 2021-04-11 NOTE — Telephone Encounter (Signed)
I will start a MyAbbvie application today.

## 2021-04-14 ENCOUNTER — Telehealth: Payer: Self-pay

## 2021-04-14 NOTE — Telephone Encounter (Signed)
[facility] Patient Advocate Encounter  Completed and sent MYABBVIE application for mavyret for this patient who is uninsured.    Patient assistance phone number for follow up is [phone].   This encounter will be updated until final determination.   Stetson Pelaez, CPhT Specialty Pharmacy Patient [facility] Phone: [phone] Fax:  [phone]  

## 2021-04-30 ENCOUNTER — Telehealth: Payer: Self-pay

## 2021-04-30 NOTE — Telephone Encounter (Signed)
[facility] Patient Advocate Encounter  Completed and sent MYABBVIE application for Mavyret for this patient who is uninsured.    Patient is approved 04/21/21 through 10/16/21.  I have reached out to the patient several times and to no avail. Patient would need to call [name] @ [phone] to set up home delivery.   Clearance Coots, CPhT Specialty Pharmacy Patient [facility] Phone: [phone] Fax:  [phone]

## 2021-05-09 ENCOUNTER — Ambulatory Visit: Payer: Self-pay

## 2021-05-09 NOTE — Telephone Encounter (Signed)
Patient called and says she has scabies from a friend's grandchildren that she was around 1 week ago. She says they slept in the same bed, she played with the kids, right much contact. She says they ended up itching with rash and their doctor diagnosed them with scabies. Patient says she's been using OTC lice medicine, but nothing is helping. She says she has rash around the back of her neck, back of legs underneath stomach, around ankles, toes, bunch of little dots in a row and itching is bad. She denies any other symptoms. I advised no availability in the office with her PCP or any provider today or next week. She says she can't wait until next week and wants something called into the pharmacy. I advised she will need to be seen by a provider, advised to do an e-visit or virtual through [place], code sent to set up MyChart, advised UC or on Monday to go to the Mobile Unit. Care advice given, she verbalized understanding.   Reason for Disposition  [1] Scabies is suspected (very itchy, bumpy rash) AND [2] hasn't been diagnosed  Answer Assessment - Initial Assessment Questions 1. PLACE OF EXPOSURE: "Where were you when you were exposed?" (e.g., home, work)     Friend's grandchildren 2. TYPE OF EXPOSURE: "How were you exposed?" "How much contact was there?"     Friend's grandchildren-holding and playing with the kids, slept in the bed together, sitting on furniture together 3. DATE OF EXPOSURE: "When did the exposure occur?" (e.g., days)     1 week ago 4. SYMPTOMS: "Do you have any symptoms?" (e.g., itching, bumpy rash)  If Yes, ask: "What does it look like?" "Which parts of your body are affected?"     Itching, rash located back of legs underneath stomach, around back of neck, around ankles, toes, bunch of little dots in a row 5. PRIOR HISTORY: "Have you ever had scabies before?"     No 6. CLOSE CONTACTS: "Does any person who lives with you or has had close contact with you have itching?"      Yes  Protocols used: Scabies Exposure-A-AH

## 2021-05-12 ENCOUNTER — Ambulatory Visit: Payer: Self-pay | Admitting: Physician Assistant

## 2021-05-12 ENCOUNTER — Other Ambulatory Visit: Payer: Self-pay

## 2021-05-12 ENCOUNTER — Telehealth: Payer: Self-pay | Admitting: *Deleted

## 2021-05-12 ENCOUNTER — Telehealth: Payer: Self-pay | Admitting: Pharmacist

## 2021-05-12 VITALS — BP 193/99 | HR 56 | Temp 98.6°F | Resp 18 | Ht 63.0 in | Wt 133.0 lb

## 2021-05-12 DIAGNOSIS — F1920 Other psychoactive substance dependence, uncomplicated: Secondary | ICD-10-CM

## 2021-05-12 DIAGNOSIS — I10 Essential (primary) hypertension: Secondary | ICD-10-CM

## 2021-05-12 DIAGNOSIS — R030 Elevated blood-pressure reading, without diagnosis of hypertension: Secondary | ICD-10-CM

## 2021-05-12 DIAGNOSIS — B86 Scabies: Secondary | ICD-10-CM

## 2021-05-12 MED ORDER — PERMETHRIN 5 % EX CREA
1.0000 "application " | TOPICAL_CREAM | Freq: Once | CUTANEOUS | 0 refills | Status: DC
  Filled 2021-05-12: qty 60, 30d supply, fill #0
  Filled 2021-05-27: qty 60, 60d supply, fill #0

## 2021-05-12 NOTE — Progress Notes (Signed)
Established Patient Office Visit  Subjective:  Patient ID: Ariana Jensen, female    DOB: 12-14-1965  Age: 55 y.o. MRN: [id number]  CC:  Chief Complaint  Patient presents with   Insect Bite    Scabies exposure    HPI Ariana Jensen reports that she was exposed to scabies approximately 1 week ago.  Reports that she has been having a pruritic rash around the back of her neck, her stomach, ankles, in between her fingers and in between her toes.  Reports that she has been using over-the-counter lice medication without relief.  Denies any new medications, body lotions, body washes, fragrances.  States that grandchildren were also diagnosed with scabies.  Reports that she has not taken her blood pressure medication today, does states that she takes it on a daily basis, denies any hypertensive symptoms.  States that her blood pressure becomes elevated when she is anxious and the itching is causing her to be very anxious.   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   Trichomonas 04/17/2011   Diagnosed 04/02/11 during hospitalization, treated with Flagyl 2g, patient instructed to have partner treated (must follow-up)    Tuberculosis    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 age 43, now s/p 1-yr of multidrug treatment (dates unknown)    Past Surgical History:  Procedure Laterality Date   CESAREAN SECTION   1984   DILATION AND CURETTAGE OF UTERUS     I & D EXTREMITY  10/18/2011   Procedure: IRRIGATION AND DEBRIDEMENT EXTREMITY;  Surgeon: Sharma Covert, MD;  Location: [facility] OR;  Service: Orthopedics;  Laterality: Left;   I & D EXTREMITY  10/21/2011   Procedure: IRRIGATION AND DEBRIDEMENT EXTREMITY;  Surgeon: Sharma Covert, MD;  Location: [facility] OR;  Service: Orthopedics;  Laterality: Left;   SALPINGECTOMY Left March 2010   ectopic pregnancy    Family History  Problem Relation Age of Onset   Heart attack Mother    Heart attack Father    Heart attack Paternal Grandmother    Heart attack Paternal Grandfather    Cirrhosis Brother     Social History   Socioeconomic History   Marital status: Widowed    Spouse name: Not on file   Number of children: 1   Years of education: 7th grade   Highest education level: Not on file  Occupational History   Not on file  Tobacco Use   Smoking status: Every Day    Packs/day: 0.50    Years: 32.00    Pack years: 16.00    Types: Cigarettes   Smokeless tobacco: Never  Vaping Use   Vaping Use: Never used  Substance and Sexual Activity   Alcohol use: Yes    Alcohol/week: 0.0 standard drinks    Comment: occ   Drug use: Yes    Types:  Oxycodone, Heroin, Cocaine, IV    Comment: Pt stated "I do heroin"   Sexual activity: Not Currently    Birth control/protection: None  Other Topics Concern   Not on file  Social History Narrative   Lives in [place]   Currently Unemployeed   Social Determinants of Health   Financial Resource Strain: Not on file  Food Insecurity: Not on file  Transportation Needs: Not on file  Physical Activity: Not on file  Stress: Not on file  Social Connections: Not on file  Intimate Partner Violence: Not on file    Outpatient Medications Prior to Visit  Medication Sig Dispense Refill   divalproex (DEPAKOTE ER) 500 MG 24 hr tablet Take 1 tablet (500 mg total) by mouth every evening. 30 tablet 6   gabapentin (NEURONTIN) 300  MG capsule Take 1 capsule (300 mg total) by mouth 4 (four) times daily as needed. 90 capsule 1   lisinopril (ZESTRIL) 10 MG tablet TAKE 1 TABLET (10 MG TOTAL) BY MOUTH DAILY. 90 tablet 3   methadone (DOLOPHINE) 10 MG/ML solution Take 90 mg by mouth daily.     No facility-administered medications prior to visit.    No Known Allergies  ROS Review of Systems  Constitutional:  Negative for chills and fever.  HENT: Negative.    Eyes: Negative.   Respiratory:  Negative for shortness of breath.   Cardiovascular:  Negative for chest pain.  Gastrointestinal: Negative.   Endocrine: Negative.   Genitourinary: Negative.   Musculoskeletal: Negative.   Skin:  Positive for rash.  Allergic/Immunologic: Negative.   Neurological:  Negative for syncope and headaches.  Hematological: Negative.   Psychiatric/Behavioral: Negative.       Objective:    Physical Exam Vitals and nursing note reviewed.  Constitutional:      Appearance: Normal appearance.  HENT:     Head: Normocephalic and atraumatic.     Right Ear: External ear normal.     Left Ear: External ear normal.     Nose: Nose normal.     Mouth/Throat:     Mouth: Mucous membranes are moist.     Pharynx: Oropharynx is clear.  Eyes:     Extraocular Movements: Extraocular movements intact.     Conjunctiva/sclera: Conjunctivae normal.     Pupils: Pupils are equal, round, and reactive to light.  Cardiovascular:     Rate and Rhythm: Normal rate.     Pulses: Normal pulses.     Heart sounds: Normal heart sounds.  Pulmonary:     Effort: Pulmonary effort is normal.     Breath sounds: Normal breath sounds.  Musculoskeletal:        General: Normal range of motion.     Cervical back: Normal range of motion and neck supple.  Skin:    Comments: Tiny vesicles in different stages of healing noted on both hands, both feet and ankles, both lower legs and both forearms, upper back and on abdomen,  interdigital burrows noted on both hands   Neurological:     General: No focal deficit present.     Mental Status: She is alert and oriented to person, place, and time.  Psychiatric:        Mood and Affect: Mood normal.        Behavior: Behavior normal.        Thought Content: Thought content normal.        Judgment: Judgment normal.    BP (!) 193/99 (BP Location: Left Arm, Patient Position: Sitting,  Cuff Size: Normal)   Pulse (!) 56   Temp 98.6 F (37 C) (Oral)   Resp 18   Ht $R'5\' 3"'gv$  (1.6 m)   Wt 133 lb (60.3 kg)   LMP 12/06/2015   SpO2 100%   BMI 23.56 kg/m  Wt Readings from Last 3 Encounters:  05/12/21 133 lb (60.3 kg)  04/07/21 134 lb 8 oz (61 kg)  03/20/21 134 lb (60.8 kg)     Health Maintenance Due  Topic Date Due   Pneumococcal Vaccine 55-88 Years old (1 - PCV) Never done   COLONOSCOPY (Pts 45-48yrs Insurance coverage will need to be confirmed)  Never done   MAMMOGRAM  Never done   Zoster Vaccines- Shingrix (1 of 2) Never done   PAP SMEAR-Modifier  04/09/2018   COVID-19 Vaccine (3 - Booster for Pfizer series) 06/16/2020   INFLUENZA VACCINE  04/21/2021    There are no preventive care reminders to display for this patient.  Lab Results  Component Value Date   TSH 1.820 03/20/2021   Lab Results  Component Value Date   WBC 7.2 03/20/2021   HGB 12.8 03/20/2021   HCT 38.9 03/20/2021   MCV 94 03/20/2021   PLT 228 03/20/2021   Lab Results  Component Value Date   NA 140 03/20/2021   K 4.5 03/20/2021   CO2 25 03/20/2021   GLUCOSE 70 03/20/2021   BUN 24 03/20/2021   CREATININE 1.06 (H) 03/20/2021   BILITOT 0.3 04/07/2021   ALKPHOS 69 03/20/2021   AST 18 04/07/2021   ALT 12 04/07/2021   ALT 12 04/07/2021   PROT 7.1 04/07/2021   ALBUMIN 4.0 03/20/2021   CALCIUM 9.1 03/20/2021   ANIONGAP 8 11/01/2020   EGFR 63 03/20/2021   Lab Results  Component Value Date   CHOL 193 03/20/2021   Lab Results  Component Value Date   HDL 51 03/20/2021   Lab Results  Component Value Date   LDLCALC 109 (H)  03/20/2021   Lab Results  Component Value Date   TRIG 193 (H) 03/20/2021   Lab Results  Component Value Date   CHOLHDL 3.8 03/20/2021   Lab Results  Component Value Date   HGBA1C 5.5 03/20/2021      Assessment & Plan:   Problem List Items Addressed This Visit       Cardiovascular and Mediastinum   Elevated blood pressure reading with diagnosis of hypertension     Musculoskeletal and Integument   Scabies - Primary   Relevant Medications   permethrin (ELIMITE) 5 % cream     Other   Polysubstance dependence ([place])    Meds ordered this encounter  Medications   permethrin (ELIMITE) 5 % cream    Sig: Apply 1 application topically once for 1 dose.    Dispense:  60 g    Refill:  0    Order Specific Question:   Supervising Provider    Answer:   WRIGHT, PATRICK E [[id number]]  1. Scabies Trial permethrin.  Patient education given on supportive care. - permethrin (ELIMITE) 5 % cream; Apply 1 application topically once for 1 dose.  Dispense: 60 g; Refill: 0  2. Elevated blood pressure reading with diagnosis of hypertension Patient was recently seen by primary care provider on March 20, 2021 and blood pressure was within normal limits.  Did have 1 other office visit on April 07, 2021, blood pressure recorded at 138/96.  Patient endorses blood pressure cuff at home, patient encouraged to check blood pressure  on a daily basis, keep a written log and have available for upcoming follow-up with Dr. Joya Gaskins on May 20, 2021.  Red flags given for prompt reevaluation.   I have reviewed the patient's medical history (PMH, PSH, Social History, Family History, Medications, and allergies) , and have been updated if relevant. I spent 31 minutes reviewing chart and  face to face time with patient.     Follow-up: Return in about 8 days (around 05/20/2021) for At [facility].    Loraine Grip Mayers, PA-C

## 2021-05-12 NOTE — Progress Notes (Signed)
Patient reports possible exposure to scabies. Patient has has taken methadone today and last took BP medication yesterday. Patient has eaten today. Patient report exposure to scabies from a friend who was diagnosed last week. Patient has used bed bug treatment, live and washed linens with no relief. Patient reports itching in the hands and feet. Patient denies pain at this time.

## 2021-05-12 NOTE — Telephone Encounter (Signed)
Thanks

## 2021-05-12 NOTE — Telephone Encounter (Signed)
Copied from CRM [id number]. Topic: General - Other >> May 09, 2021  8:11 AM Traci Sermon wrote: Reason for CRM: Pt called in stating she was around someone who has had scabies, and wanted to see about getting some medication. Please advise

## 2021-05-12 NOTE — Patient Instructions (Signed)
I prescribed lotion to help you with your scabies infection.  Your blood pressure is elevated today, at your last office visit with Dr. Delford Field your blood pressure was within normal range.  I do encourage you to make sure you are taking the lisinopril on a daily basis, check your blood pressure on a daily basis, keep a written log and have available for all of your office visits.  Make sure that you keep your follow-up with Dr. Delford Field on August 30 for further review of your blood pressure readings.  Please let us know if there is anything else we can do for you.  Roney Jaffe, PA-C Physician Assistant [facility] [email or web address]   Scabies, Adult  Scabies is a skin condition that happens when very small insects called mites get under the skin (infestation). This causes a rash and severe itchiness. Scabies is contagious, which means it can spread from person to person. If you get scabies, it is common forothers in your household to get scabies too. With proper treatment, symptoms usually go away in 2-4 weeks. Scabies usuallydoes not cause lasting problems. What are the causes? This condition is caused by tiny mites (Sarcoptes scabiei, or human itch mites) that can only be seen with a microscope. The mites get into the top layer of skin and lay eggs. Scabies can spread from person to person through: Close contact with a person who has scabies. Sharing or having contact with infested items, such as towels, bedding, or clothing. What increases the risk? The following factors may make you more likely to develop this condition: Living in a nursing home or other extended care facility. Having sexual contact with a partner who has scabies. Caring for others who are at increased risk for scabies. What are the signs or symptoms? Symptoms of this condition include: Severe itchiness. This is often worse at night. A rash that includes tiny red  bumps or blisters. The rash commonly occurs on the hands, wrists, elbows, armpits, chest, waist, groin, or buttocks. The bumps may form a line (burrow) in some areas. Skin irritation. This can include scaly patches or sores. How is this diagnosed? This condition may be diagnosed based on: A physical exam of the skin. A skin test. Your health care provider may take a sample of your affected skin (skin scraping) and have it examined under a microscope for signs of mites. How is this treated? This condition may be treated with: Medicated cream or lotion that kills the mites. This is spread on the entire body and left on for several hours. Usually, one treatment with medicated cream or lotion is enough to kill all the mites. In severe cases, the treatment may need to be repeated. Medicated cream that relieves itching. Medicines taken by mouth (orally) that: Relieve itching. Reduce the swelling and redness. Kill the mites. This treatment may be done in severe cases. Follow these instructions at home: Medicines Take or apply over-the-counter and prescription medicines only as told by your health care provider. Apply medicated cream or lotion as told by your health care provider. Do not wash off the medicated cream or lotion until the necessary amount of time has passed. Skin care Avoid scratching the affected areas of your skin. Keep your fingernails closely trimmed to reduce injury from scratching. Take cool baths or apply cool washcloths to your skin to help reduce itching. General instructions Clean all items that you had contact with during the 3 days before diagnosis. This includes bedding, clothing,  towels, and furniture. Do this on the same day that you start treatment. Dry-clean items, or use hot water to wash items. Dry items on the hot dry cycle. Place items that cannot be washed into closed, airtight plastic bags for at least 3 days. The mites cannot live for more than 3 days away from  human skin. Vacuum furniture and mattresses that you use. Make sure that other people who may have been infested are examined by a health care provider. These include members of your household and anyone who may have had contact with infested items. Keep all follow-up visits. This is important. Where to find more information Centers for Disease Control and Prevention: [employer].br Contact a health care provider if: You have itching that does not go away after 4 weeks of treatment. You continue to develop new bumps or burrows. You have redness, swelling, or pain in your rash area after treatment. You have fluid, blood, or pus coming from your rash. Summary Scabies is a skin condition that causes a rash and severe itchiness. This condition is caused by tiny mites that get into the top layer of the skin and lay eggs. Scabies can spread from person to person. Follow treatments as recommended by your health care provider. Clean all items that you recently had contact with. This information is not intended to replace advice given to you by your health care provider. Make sure you discuss any questions you have with your healthcare provider. Document Revised: 01/05/2020 Document Reviewed: 01/05/2020 Elsevier Patient Education  2022 [employer].

## 2021-05-12 NOTE — Telephone Encounter (Signed)
Patient is approved to receive Mavyret x 8 weeks for chronic Hepatitis C infection. Counseled patient to take all three tablets of Mavyret daily with food.  Counseled patient the need to take all three tablets together and to not separate them out during the day. Encouraged patient not to miss any doses and explained how their chance of cure could go down with each dose missed. Counseled patient on what to do if dose is missed - if it is closer to the missed dose take immediately; if closer to next dose then skip dose and take the next dose at the usual time. Counseled patient on common side effects such as headache, fatigue, and nausea and that these normally decrease with time. I reviewed patient medications and found no drug interactions. Discussed with patient that there are several drug interactions with Mavyret and instructed patient to call the clinic if she wishes to start a new medication during course of therapy. Also advised patient to call if she experiences any side effects. Patient will follow-up with Cassie in the pharmacy clinic on 9/26 at 1:45.

## 2021-05-13 DIAGNOSIS — B86 Scabies: Secondary | ICD-10-CM

## 2021-05-13 HISTORY — DX: Scabies: B86

## 2021-05-13 NOTE — Telephone Encounter (Signed)
Just now seeing:  Pt was just seen 8/22 by Cari at [facility] and was given permethrin , if cari felt this was necessary no additional encounters needed. She should get the cream and apply the cream

## 2021-05-13 NOTE — Telephone Encounter (Signed)
Routing to PCP for review.

## 2021-05-13 NOTE — Telephone Encounter (Signed)
Would need a direct exam and OV to determine any Treatments   we do not treat based just on exposure,  does she have any rash anywhere?

## 2021-05-15 ENCOUNTER — Other Ambulatory Visit: Payer: Self-pay

## 2021-05-15 NOTE — Telephone Encounter (Signed)
Noted  

## 2021-05-19 ENCOUNTER — Other Ambulatory Visit: Payer: Self-pay

## 2021-05-20 ENCOUNTER — Ambulatory Visit: Payer: Self-pay | Attending: Critical Care Medicine | Admitting: Critical Care Medicine

## 2021-05-20 ENCOUNTER — Other Ambulatory Visit: Payer: Self-pay

## 2021-05-20 ENCOUNTER — Encounter: Payer: Self-pay | Admitting: Critical Care Medicine

## 2021-05-20 VITALS — BP 183/97 | HR 67 | Ht 63.0 in | Wt 134.6 lb

## 2021-05-20 DIAGNOSIS — I10 Essential (primary) hypertension: Secondary | ICD-10-CM | POA: Insufficient documentation

## 2021-05-20 DIAGNOSIS — L299 Pruritus, unspecified: Secondary | ICD-10-CM

## 2021-05-20 DIAGNOSIS — I160 Hypertensive urgency: Secondary | ICD-10-CM | POA: Insufficient documentation

## 2021-05-20 DIAGNOSIS — Z1211 Encounter for screening for malignant neoplasm of colon: Secondary | ICD-10-CM

## 2021-05-20 DIAGNOSIS — B851 Pediculosis due to Pediculus humanus corporis: Secondary | ICD-10-CM

## 2021-05-20 DIAGNOSIS — F1910 Other psychoactive substance abuse, uncomplicated: Secondary | ICD-10-CM

## 2021-05-20 DIAGNOSIS — B182 Chronic viral hepatitis C: Secondary | ICD-10-CM

## 2021-05-20 DIAGNOSIS — B86 Scabies: Secondary | ICD-10-CM

## 2021-05-20 MED ORDER — GABAPENTIN 300 MG PO CAPS
300.0000 mg | ORAL_CAPSULE | Freq: Four times a day (QID) | ORAL | 1 refills | Status: DC | PRN
  Filled 2021-05-20: qty 90, 23d supply, fill #0
  Filled 2021-06-16: qty 90, 23d supply, fill #1

## 2021-05-20 MED ORDER — CLONIDINE HCL 0.1 MG PO TABS
0.1000 mg | ORAL_TABLET | Freq: Once | ORAL | Status: AC
Start: 2021-05-20 — End: 2021-05-20
  Administered 2021-05-20: 0.1 mg via ORAL

## 2021-05-20 MED ORDER — VALSARTAN-HYDROCHLOROTHIAZIDE 320-25 MG PO TABS
1.0000 | ORAL_TABLET | Freq: Every day | ORAL | 3 refills | Status: DC
  Filled 2021-05-20: qty 30, 30d supply, fill #0

## 2021-05-20 NOTE — Assessment & Plan Note (Signed)
Resolved

## 2021-05-20 NOTE — Assessment & Plan Note (Signed)
Ongoing cocaine use

## 2021-05-20 NOTE — Patient Instructions (Signed)
Call the cancer control program to apply for free mammogram and pap smear, they will order the studies once approved The number is:   [phone]   Stop lisinopril and begin valsartan HCT 1 daily for blood pressure  You are cleared to go back to [facility] I have provided a letter for this  Fecal occult kit given for colon cancer screening and labs today metabolic panel to check your kidneys  Keep yourself well-hydrated and follow the Dash diet attached  Return to see Lurena Joiner our clinical pharmacist in 2 weeks to assess your blood pressure control return to see Dr. Joya Gaskins in 2 months  Avoid all cocaine and discuss this with your [facility] provider

## 2021-05-20 NOTE — Assessment & Plan Note (Signed)
Patient encouraged to follow-up with hepatitis C treatment

## 2021-05-20 NOTE — Assessment & Plan Note (Signed)
Hypertensive urgency responded to 1 dose of 0.1 mg clonidine patient will now be switched to valsartan HCT 320/25 1 daily and we will obtain a metabolic panel and thyroid function patient to be seen back in short-term in 2 weeks by clinical pharmacy and by Dr. Delford Field in 2 months

## 2021-05-20 NOTE — Assessment & Plan Note (Signed)
Body lice and scabies resolved

## 2021-05-20 NOTE — Progress Notes (Signed)
New Patient Office Visit  Subjective:  Patient ID: Ariana Jensen, female    DOB: August 28, 1966  Age: 55 y.o. MRN: [id number]  CC:  Chief Complaint  Patient presents with   Hypertension    HPI 02/2021 Ariana BaloSamantha Jensen presents to the clinic today to establish care with a primary care provider. The patient has hypertension which is well controlled with a blood pressure on arrival today of 114/75. The patient is prescribed amlodipine 10 mg, though she has not been taking this medication. The patient is also prescribed lisinopril 10 mg which she takes daily. The patient states her at home blood pressure readings at home are usually around 200/100.    The patient also had a recent episode of syncope, which the patient believes was a seizure 1.5 weeks ago. The patient states she lost consciousness when she was speaking with a neighbor. The patient is unsure how long she lost consciousness for. The patient's neighbor told her the patient's eyes rolled back and then she was shaking during the episode. The patient did not bite her tongue or urinate on herself.   The patient has a history of intermittent chest pain. She reports having a few episodes of chest pain over the past few months which she attributed to smoking. The patient has seen cardiology in the past and has had both a echo and a myocardial perfusion scan. The echocardiogram revealed moderate concentric hypertrophy of the left ventricle with vigorous systolic function. Multiple PVCs are noted on a recent EKG the patient underwent.   The patient also has a history of polysubstance abuse. She goes to the [facility] clinic daily for her methadone treatment. The patient also has cocaine abuse and states her last cocaine use was 3 weeks ago. She denies any recent alcohol use or use of any other illicit substances. The patient sees a counselor at [facility] and receives regular drug screening tests at this clinic.  The patient has tobacco use disorder. The  patient has recently decreased her tobacco use from 1 pack daily to 10 cigarettes daily. She has been using nicotine gum which was been helpful.  The patient also has been experiencing chronic left hip and left leg pain. She states the pain sometimes causes weakness in her leg which causes it to give out. The patient is prescribed gabapentin 300 mg four times daily which she takes as needed for pain. She states this is somewhat helpful. She reports she does have degenerative disc disease in her lumbar spine. The patient was referred to orthopedics at her last visit but states she never received a call from them.   The patient has a history of chronic hepatitis C. She states she saw a hepatitis C specialist in the past but was never placed on medication for this condition.   The patient has a history of bipolar disorder for which she takes Depakote 500 mg daily. She has been experiencing periods of depressed mood and irritable mood recently. The patient would like to see a mental health professional to discuss her treatment for this condition. The patient denies any suicidal or homicidal ideation.   05/20/2021 Patient is seen today and on arrival blood pressure is 223/126.  She has been complaining of some headaches and she is not drinking much water.  She is only on 10 mg a day of Zestril.  Patient recently was treated with permethrin cream for scabies which has resolved.  The itching she had has now resolved.  Note patient is due  colon cancer screening she needs a Pap smear and a mammogram and she is uninsured.  Patient does receive methadone at the drug treatment center and needs a letter clearing her to return to outpatient therapy sessions now that she is free of any type of parasitic controlled infection.  Note she has been using cocaine regularly about once a week.  Note she does have chronic active hepatitis C and now is about to start antiviral therapy from the hepatitis C clinic   Past Medical  History:  Diagnosis Date   Anxiety    ASCUS (atypical squamous cells of undetermined significance) on Pap smear    Body lice 08/05/2015   Chronic lower back pain    Cocaine abuse (HCC)    crack cocaine   Depression    Fibromyalgia    GERD (gastroesophageal reflux disease)    Hepatitis C    Hepatitis C antibody test positive    Hypertension    Hypokalemia 03/04/2018   Leukocytosis 03/04/2018   Opioid dependence (HCC)    PVC's (premature ventricular contractions) 03/04/2018   Scabies 05/13/2021   Seizures (HCC)    "don't know why; I've had 2 in the past year" (08/18/2016)   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   Trichomonas 04/17/2011   Diagnosed 04/02/11 during hospitalization, treated with Flagyl 2g, patient instructed to have partner treated (must follow-up)    Tuberculosis    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 age 38, now s/p 1-yr of multidrug treatment (dates unknown)    Past Surgical History:  Procedure Laterality Date   CESAREAN SECTION  1984   DILATION AND CURETTAGE OF UTERUS     I & D EXTREMITY  10/18/2011   Procedure: IRRIGATION AND DEBRIDEMENT EXTREMITY;  Surgeon: Sharma Covert, MD;  Location: [facility] OR;  Service: Orthopedics;  Laterality: Left;   I & D EXTREMITY  10/21/2011   Procedure: IRRIGATION AND DEBRIDEMENT EXTREMITY;  Surgeon: Sharma Covert, MD;  Location: [facility] OR;  Service: Orthopedics;  Laterality: Left;   SALPINGECTOMY Left March 2010   ectopic pregnancy    Family History  Problem Relation Age of Onset   Heart attack Mother    Heart attack Father    Heart attack Paternal Grandmother    Heart attack Paternal Grandfather    Cirrhosis Brother     Social History   Socioeconomic History   Marital status: Widowed    Spouse name: Not on file   Number of children: 1   Years of education: 7th grade   Highest education level: Not on file  Occupational History   Not on file  Tobacco Use   Smoking status: Every Day     Packs/day: 0.50    Years: 32.00    Pack years: 16.00    Types: Cigarettes   Smokeless tobacco: Never  Vaping Use   Vaping Use: Never used  Substance and Sexual Activity   Alcohol use: Yes    Alcohol/week: 0.0 standard drinks    Comment: occ   Drug use: Yes    Types: Oxycodone, Heroin, Cocaine, IV    Comment: Pt stated "I do heroin"   Sexual activity: Not Currently    Birth control/protection: None  Other Topics Concern   Not on file  Social History Narrative   Lives in [place]   Currently Unemployeed   Social Determinants of Health   Financial Resource Strain: Not on file  Food Insecurity: Not on file  Transportation Needs: Not on file  Physical Activity: Not on file  Stress: Not on file  Social Connections: Not on file  Intimate Partner Violence: Not on file    ROS Review of Systems  Constitutional:  Negative for chills and fever.  HENT:  Negative for congestion and rhinorrhea.   Eyes:  Negative for pain.  Respiratory:  Negative for cough and shortness of breath.   Cardiovascular:  Negative for chest pain.  Gastrointestinal:  Negative for abdominal pain, nausea and vomiting.  Endocrine: Negative.   Genitourinary:  Negative for difficulty urinating and dyspareunia.  Musculoskeletal:  Negative for back pain.  Skin:  Negative for rash.  Neurological:  Positive for headaches. Negative for syncope.  Psychiatric/Behavioral:  Negative for dysphoric mood, self-injury and suicidal ideas.    Objective:   Today's Vitals: BP (!) 183/97   Pulse 67   Ht 5\' 3"  (1.6 m)   Wt 134 lb 9.6 oz (61.1 kg)   LMP 12/06/2015   SpO2 98%   BMI 23.84 kg/m   Physical Exam Vitals and nursing note reviewed.  Constitutional:      General: She is not in acute distress. HENT:     Head: Normocephalic and atraumatic.     Mouth/Throat:     Mouth: Mucous membranes are moist.     Pharynx: Oropharynx is clear.  Eyes:     Conjunctiva/sclera: Conjunctivae normal.  Cardiovascular:      Rate and Rhythm: Normal rate and regular rhythm.     Heart sounds: Normal heart sounds. No murmur heard.   No friction rub. No gallop.  Pulmonary:     Effort: Pulmonary effort is normal. No respiratory distress.     Breath sounds: Normal breath sounds. No wheezing.  Abdominal:     General: There is no distension.     Palpations: Abdomen is soft.     Tenderness: There is no abdominal tenderness.  Musculoskeletal:        General: Normal range of motion.     Cervical back: Normal range of motion.  Skin:    General: Skin is warm.     Comments: No rash , no evident scabies  Neurological:     Mental Status: She is alert and oriented to person, place, and time.     Motor: No weakness.  Psychiatric:        Behavior: Behavior normal.   Repeat blood pressure was reduced therefore patient was allowed to leave after 1 dose of clonidine 0.1 mg Assessment & Plan:   Problem List Items Addressed This Visit       Cardiovascular and Mediastinum   Hypertensive urgency - Primary    Hypertensive urgency responded to 1 dose of 0.1 mg clonidine patient will now be switched to valsartan HCT 320/25 1 daily and we will obtain a metabolic panel and thyroid function patient to be seen back in short-term in 2 weeks by clinical pharmacy and by Dr. 12/08/2015 in 2 months      Relevant Medications   valsartan-hydrochlorothiazide (DIOVAN-HCT) 320-25 MG tablet   Other Relevant Orders   Basic Metabolic Panel   Thyroid Panel With TSH     Digestive   Hepatitis C, chronic (HCC)    Patient encouraged to follow-up with hepatitis C treatment        Musculoskeletal and Integument   Pruritic dermatitis    Resolved      RESOLVED: Body lice    Body lice and scabies resolved      RESOLVED: Scabies    Resolved  Other   Polysubstance abuse (HCC)    Ongoing cocaine use      Other Visit Diagnoses     Colon cancer screening       Relevant Orders   Fecal occult blood, imunochemical       Outpatient Encounter Medications as of 05/20/2021  Medication Sig   divalproex (DEPAKOTE ER) 500 MG 24 hr tablet Take 1 tablet (500 mg total) by mouth every evening.   methadone (DOLOPHINE) 10 MG/ML solution Take 90 mg by mouth daily.   valsartan-hydrochlorothiazide (DIOVAN-HCT) 320-25 MG tablet Take 1 tablet by mouth daily.   [DISCONTINUED] gabapentin (NEURONTIN) 300 MG capsule Take 1 capsule (300 mg total) by mouth 4 (four) times daily as needed.   [DISCONTINUED] lisinopril (ZESTRIL) 10 MG tablet TAKE 1 TABLET (10 MG TOTAL) BY MOUTH DAILY.   gabapentin (NEURONTIN) 300 MG capsule Take 1 capsule (300 mg total) by mouth 4 (four) times daily as needed.   [EXPIRED] cloNIDine (CATAPRES) tablet 0.1 mg    No facility-administered encounter medications on file as of 05/20/2021.   Follow-up: Return in about 2 months (around 07/20/2021).   Shan Levans, MD

## 2021-05-21 ENCOUNTER — Other Ambulatory Visit: Payer: Self-pay

## 2021-05-21 LAB — BASIC METABOLIC PANEL
BUN/Creatinine Ratio: 20 (ref 9–23)
BUN: 13 mg/dL (ref 6–24)
CO2: 25 mmol/L (ref 20–29)
Calcium: 9.7 mg/dL (ref 8.7–10.2)
Chloride: 101 mmol/L (ref 96–106)
Creatinine, Ser: 0.66 mg/dL (ref 0.57–1.00)
Glucose: 100 mg/dL — ABNORMAL HIGH (ref 65–99)
Potassium: 4.7 mmol/L (ref 3.5–5.2)
Sodium: 139 mmol/L (ref 134–144)
eGFR: 105 mL/min/{1.73_m2} (ref >59)

## 2021-05-21 LAB — THYROID PANEL WITH TSH
Free Thyroxine Index: 2.6 (ref 1.2–4.9)
T3 Uptake Ratio: 23 % — ABNORMAL LOW (ref 24–39)
T4, Total: 11.5 ug/dL (ref 4.5–12.0)
TSH: 0.675 u[IU]/mL (ref 0.450–4.500)

## 2021-05-27 ENCOUNTER — Other Ambulatory Visit: Payer: Self-pay

## 2021-06-05 ENCOUNTER — Other Ambulatory Visit: Payer: Self-pay

## 2021-06-05 ENCOUNTER — Ambulatory Visit: Payer: Medicaid Other | Admitting: Cardiology

## 2021-06-16 ENCOUNTER — Ambulatory Visit: Payer: Medicaid Other | Admitting: Pharmacist

## 2021-06-16 ENCOUNTER — Other Ambulatory Visit: Payer: Self-pay

## 2021-06-18 ENCOUNTER — Other Ambulatory Visit: Payer: Self-pay

## 2021-07-20 NOTE — Progress Notes (Signed)
Established Patient Office Visit  Subjective:  Patient ID: Ariana Jensen, female    DOB: 1966/03/22  Age: 55 y.o. MRN: [id number]  CC:  Chief Complaint  Patient presents with   Hypertension    Patient wants to be treated for scabies    HPI Ariana Jensen presents for primary care follow-up.  On arrival blood pressure 203/124 and patient was given 1 dose of clonidine point 1 mg.  She had stopped taking all her blood pressure medicine because when she was on the 320/25 valsartan HCT she was getting blood pressure readings of 80/60 felt dizzy.  She is still smoking half pack a day of cigarettes.  She is had a preflight currents of her severe pruritus which may be psychogenic in nature.  She thinks she has scabies and some of the lesions do have that appearance.  She does need a flu vaccine she will agree to this.  Patient does have active mental health follow-up.  The patient was to see our clinical pharmacist she was not able to keep that appointment.  She maintains methadone 95 mg daily per aDS  Patient does have a history of chronic hepatitis C she has not followed up with these visits as well. Patient is yet to receive her mammogram and has not followed up with Pap smear yet. Past Medical History:  Diagnosis Date   Anxiety    ASCUS (atypical squamous cells of undetermined significance) on Pap smear    Body lice 57/32/2025   Chronic lower back pain    Cocaine abuse (HCC)    crack cocaine   Depression    Fibromyalgia    GERD (gastroesophageal reflux disease)    Hepatitis C    Hepatitis C antibody test positive    Hypertension    Hypokalemia 03/04/2018   Leukocytosis 03/04/2018   Opioid dependence ([place])    PVC's (premature ventricular contractions) 03/04/2018   Scabies 05/13/2021   Seizures ([place])    "don't know why; I've had 2 in the past year" (08/18/2016)   Severe recurrent major depression without psychotic features ([place]) 06/21/2018   Trichomonas 04/17/2011    Diagnosed 04/02/11 during hospitalization, treated with Flagyl 2g, patient instructed to have partner treated (must follow-up)    Tuberculosis    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 age 33, now s/p 1-yr of multidrug treatment (dates unknown)    Past Surgical History:  Procedure Laterality Date   [place]     I & D EXTREMITY  10/18/2011   Procedure: IRRIGATION AND DEBRIDEMENT EXTREMITY;  Surgeon: Linna Hoff, MD;  Location: [facility];  Service: Orthopedics;  Laterality: Left;   I & D EXTREMITY  10/21/2011   Procedure: IRRIGATION AND DEBRIDEMENT EXTREMITY;  Surgeon: Linna Hoff, MD;  Location: [facility];  Service: Orthopedics;  Laterality: Left;   SALPINGECTOMY Left March 2010   ectopic pregnancy    Family History  Problem Relation Age of Onset   Heart attack Mother    Heart attack Father    Heart attack Paternal Grandmother    Heart attack Paternal Grandfather    Cirrhosis Brother     Social History   Socioeconomic History   Marital status: Widowed    Spouse name: Not on file   Number of children: 1   Years of education: 7th grade   Highest education level: Not on file  Occupational History   Not on file  Tobacco Use   Smoking  status: Every Day    Packs/day: 0.50    Years: 32.00    Pack years: 16.00    Types: Cigarettes   Smokeless tobacco: Never  Vaping Use   Vaping Use: Never used  Substance and Sexual Activity   Alcohol use: Yes    Alcohol/week: 0.0 standard drinks    Comment: occ   Drug use: Yes    Types: Oxycodone, Heroin, Cocaine, IV    Comment: Pt stated "I do heroin"   Sexual activity: Not Currently    Birth control/protection: None  Other Topics Concern   Not on file  Social History Narrative   Lives in [place]   Currently Unemployeed   Social Determinants of Health   Financial Resource Strain: Not on file  Food Insecurity: Not on file  Transportation Needs: Not on file  Physical Activity: Not  on file  Stress: Not on file  Social Connections: Not on file  Intimate Partner Violence: Not on file    Outpatient Medications Prior to Visit  Medication Sig Dispense Refill   divalproex (DEPAKOTE ER) 500 MG 24 hr tablet Take 1 tablet (500 mg total) by mouth every evening. 30 tablet 6   gabapentin (NEURONTIN) 300 MG capsule Take 1 capsule (300 mg total) by mouth 4 (four) times daily as needed. 90 capsule 1   methadone (DOLOPHINE) 10 MG/ML solution Take 95 mg by mouth daily.     permethrin (ELIMITE) 5 % cream Apply 1 application topically once for 1 dose. 60 g 0   valsartan-hydrochlorothiazide (DIOVAN-HCT) 320-25 MG tablet Take 1 tablet by mouth daily. (Patient not taking: Reported on 07/21/2021) 90 tablet 3   No facility-administered medications prior to visit.    No Known Allergies  ROS Review of Systems  Constitutional: Negative.   HENT: Negative.  Negative for ear pain, postnasal drip, rhinorrhea, sinus pressure, sore throat, trouble swallowing and voice change.   Eyes: Negative.   Respiratory: Negative.  Negative for apnea, cough, choking, chest tightness, shortness of breath, wheezing and stridor.   Cardiovascular: Negative.  Negative for chest pain, palpitations and leg swelling.  Gastrointestinal: Negative.  Negative for abdominal distention, abdominal pain, nausea and vomiting.  Genitourinary: Negative.   Musculoskeletal: Negative.  Negative for arthralgias and myalgias.  Skin:  Positive for rash.       Severe pruritus  Allergic/Immunologic: Negative.  Negative for environmental allergies and food allergies.  Neurological: Negative.  Negative for dizziness, syncope, weakness and headaches.  Hematological: Negative.  Negative for adenopathy. Does not bruise/bleed easily.  Psychiatric/Behavioral:  Positive for decreased concentration. Negative for agitation and sleep disturbance. The patient is nervous/anxious and is hyperactive.      Objective:    Physical Exam Vitals  reviewed.  Constitutional:      Appearance: Normal appearance. She is well-developed. She is not diaphoretic.  HENT:     Head: Normocephalic and atraumatic.     Nose: No nasal deformity, septal deviation, mucosal edema or rhinorrhea.     Right Sinus: No maxillary sinus tenderness or frontal sinus tenderness.     Left Sinus: No maxillary sinus tenderness or frontal sinus tenderness.     Mouth/Throat:     Pharynx: No oropharyngeal exudate.  Eyes:     General: No scleral icterus.    Conjunctiva/sclera: Conjunctivae normal.     Pupils: Pupils are equal, round, and reactive to light.  Neck:     Thyroid: No thyromegaly.     Vascular: No carotid bruit or JVD.  Trachea: Trachea normal. No tracheal tenderness or tracheal deviation.  Cardiovascular:     Rate and Rhythm: Normal rate and regular rhythm.     Chest Wall: PMI is not displaced.     Pulses: Normal pulses. No decreased pulses.     Heart sounds: Normal heart sounds, S1 normal and S2 normal. Heart sounds not distant. No murmur heard. No systolic murmur is present.  No diastolic murmur is present.    No friction rub. No gallop. No S3 or S4 sounds.  Pulmonary:     Effort: No tachypnea, accessory muscle usage or respiratory distress.     Breath sounds: No stridor. No decreased breath sounds, wheezing, rhonchi or rales.  Chest:     Chest wall: No tenderness.  Abdominal:     General: Bowel sounds are normal. There is no distension.     Palpations: Abdomen is soft. Abdomen is not rigid.     Tenderness: There is no abdominal tenderness. There is no guarding or rebound.  Musculoskeletal:        General: Normal range of motion.     Cervical back: Normal range of motion and neck supple. No edema, erythema or rigidity. No muscular tenderness. Normal range of motion.  Lymphadenopathy:     Head:     Right side of head: No submental or submandibular adenopathy.     Left side of head: No submental or submandibular adenopathy.     Cervical:  No cervical adenopathy.  Skin:    General: Skin is warm and dry.     Coloration: Skin is not pale.     Findings: No rash.     Nails: There is no clubbing.  Neurological:     Mental Status: She is alert and oriented to person, place, and time.     Sensory: No sensory deficit.  Psychiatric:        Attention and Perception: She is inattentive. She does not perceive auditory or visual hallucinations.        Mood and Affect: Mood is anxious. Affect is labile.        Speech: Speech normal.        Behavior: Behavior is agitated and hyperactive. Behavior is not slowed, aggressive, withdrawn or combative. Behavior is cooperative.        Thought Content: Thought content normal. Thought content is not paranoid or delusional. Thought content does not include homicidal or suicidal ideation. Thought content does not include homicidal or suicidal plan.        Cognition and Memory: Cognition is not impaired. Memory is impaired. She exhibits impaired recent memory. She does not exhibit impaired remote memory.        Judgment: Judgment is impulsive.    BP (!) 203/124   Pulse 63   Resp 16   Wt 128 lb 12.8 oz (58.4 kg)   LMP 12/06/2015   SpO2 95%   BMI 22.82 kg/m  Wt Readings from Last 3 Encounters:  07/21/21 128 lb 12.8 oz (58.4 kg)  05/20/21 134 lb 9.6 oz (61.1 kg)  05/12/21 133 lb (60.3 kg)     Health Maintenance Due  Topic Date Due   Pneumococcal Vaccine 57-74 Years old (1 - PCV) Never done   COLON CANCER SCREENING ANNUAL FOBT  Never done   MAMMOGRAM  Never done   Zoster Vaccines- Shingrix (1 of 2) Never done   PAP SMEAR-Modifier  04/09/2018   COVID-19 Vaccine (3 - Booster for Pfizer series) 03/11/2020    There  are no preventive care reminders to display for this patient.  Lab Results  Component Value Date   TSH 0.675 05/20/2021   Lab Results  Component Value Date   WBC 7.2 03/20/2021   HGB 12.8 03/20/2021   HCT 38.9 03/20/2021   MCV 94 03/20/2021   PLT 228 03/20/2021   Lab  Results  Component Value Date   NA 139 05/20/2021   K 4.7 05/20/2021   CO2 25 05/20/2021   GLUCOSE 100 (H) 05/20/2021   BUN 13 05/20/2021   CREATININE 0.66 05/20/2021   BILITOT 0.3 04/07/2021   ALKPHOS 69 03/20/2021   AST 18 04/07/2021   ALT 12 04/07/2021   ALT 12 04/07/2021   PROT 7.1 04/07/2021   ALBUMIN 4.0 03/20/2021   CALCIUM 9.7 05/20/2021   ANIONGAP 8 11/01/2020   EGFR 105 05/20/2021   Lab Results  Component Value Date   CHOL 193 03/20/2021   Lab Results  Component Value Date   HDL 51 03/20/2021   Lab Results  Component Value Date   LDLCALC 109 (H) 03/20/2021   Lab Results  Component Value Date   TRIG 193 (H) 03/20/2021   Lab Results  Component Value Date   CHOLHDL 3.8 03/20/2021   Lab Results  Component Value Date   HGBA1C 5.5 03/20/2021      Assessment & Plan:   Problem List Items Addressed This Visit       Cardiovascular and Mediastinum   Hypertensive urgency    1 dose of clonidine given 0.1 mg with minimal effect  Concerned about overshoot on this so did not give additional medications  Resume valsartan at reduced dose 160/25 daily  Return to see clinical pharmacy 1 week Dr. Delford Field 6 weeks  Smoking cessation stressed      Relevant Medications   cloNIDine (CATAPRES) tablet 0.1 mg   valsartan-hydrochlorothiazide (DIOVAN-HCT) 160-25 MG tablet     Musculoskeletal and Integument   Pruritic dermatitis    Patient is concerned she may have scabies again its difficult to discern this is the case I will give a repeat treatment of permethrin and begin doxepin 50 mg at bedtime to see if this will help with the pruritus which may be psychogenic        Other   Tobacco abuse       Current smoking consumption amount: 1/2 pack a day  Dicsussion on advise to quit smoking and smoking impacts: Cardiovascular impacts  Patient's willingness to quit: Uncertain  Methods to quit smoking discussed: Behavioral modification nicotine  replacement  Medication management of smoking session drugs discussed: Nicotine replacement  Resources provided:  AVS   Setting quit date not established  Follow-up arranged 1 week   Time spent counseling the patient: 5 minutes       Bipolar I disorder, most recent episode depressed, severe without psychotic features (HCC)    Management per mental health      Polysubstance dependence [facility])    Patient denies current use      Cocaine abuse (HCC)    Patient denies current use      Other Visit Diagnoses     Colon cancer screening    -  Primary   Relevant Orders   Fecal occult blood, imunochemical   Need for immunization against influenza       Relevant Orders   Flu Vaccine QUAD 26mo+IM (Fluarix, Fluzone & Alfiuria Quad PF) (Completed)       Meds ordered this encounter  Medications  permethrin (ELIMITE) 5 % cream    Sig: Apply 1 application topically once for 1 dose. May repeat dose if needed    Dispense:  60 g    Refill:  1   cloNIDine (CATAPRES) tablet 0.1 mg   valsartan-hydrochlorothiazide (DIOVAN-HCT) 160-25 MG tablet    Sig: Take 1 tablet by mouth daily.    Dispense:  40 tablet    Refill:  3   doxepin (SINEQUAN) 50 MG capsule    Sig: Take 1 capsule (50 mg total) by mouth at bedtime.    Dispense:  30 capsule    Refill:  2    Follow-up: Return in about 6 weeks (around 09/01/2021).    Asencion Noble, MD

## 2021-07-21 ENCOUNTER — Encounter: Payer: Self-pay | Admitting: Critical Care Medicine

## 2021-07-21 ENCOUNTER — Other Ambulatory Visit: Payer: Self-pay | Admitting: Critical Care Medicine

## 2021-07-21 ENCOUNTER — Ambulatory Visit: Payer: Self-pay | Attending: Critical Care Medicine | Admitting: Critical Care Medicine

## 2021-07-21 ENCOUNTER — Other Ambulatory Visit: Payer: Self-pay

## 2021-07-21 ENCOUNTER — Telehealth: Payer: Self-pay | Admitting: Critical Care Medicine

## 2021-07-21 VITALS — BP 203/124 | HR 63 | Resp 16 | Wt 128.8 lb

## 2021-07-21 DIAGNOSIS — F314 Bipolar disorder, current episode depressed, severe, without psychotic features: Secondary | ICD-10-CM

## 2021-07-21 DIAGNOSIS — L308 Other specified dermatitis: Secondary | ICD-10-CM | POA: Insufficient documentation

## 2021-07-21 DIAGNOSIS — Z23 Encounter for immunization: Secondary | ICD-10-CM | POA: Insufficient documentation

## 2021-07-21 DIAGNOSIS — L299 Pruritus, unspecified: Secondary | ICD-10-CM

## 2021-07-21 DIAGNOSIS — F319 Bipolar disorder, unspecified: Secondary | ICD-10-CM | POA: Insufficient documentation

## 2021-07-21 DIAGNOSIS — F1920 Other psychoactive substance dependence, uncomplicated: Secondary | ICD-10-CM

## 2021-07-21 DIAGNOSIS — Z720 Tobacco use: Secondary | ICD-10-CM

## 2021-07-21 DIAGNOSIS — Z1211 Encounter for screening for malignant neoplasm of colon: Secondary | ICD-10-CM

## 2021-07-21 DIAGNOSIS — I160 Hypertensive urgency: Secondary | ICD-10-CM | POA: Insufficient documentation

## 2021-07-21 DIAGNOSIS — F1410 Cocaine abuse, uncomplicated: Secondary | ICD-10-CM

## 2021-07-21 DIAGNOSIS — F17210 Nicotine dependence, cigarettes, uncomplicated: Secondary | ICD-10-CM | POA: Insufficient documentation

## 2021-07-21 MED ORDER — PERMETHRIN 5 % EX CREA
1.0000 "application " | TOPICAL_CREAM | Freq: Once | CUTANEOUS | 1 refills | Status: AC
  Filled 2021-07-21: qty 60, 1d supply, fill #0
  Filled 2021-07-29: qty 60, 1d supply, fill #1

## 2021-07-21 MED ORDER — VALSARTAN-HYDROCHLOROTHIAZIDE 160-25 MG PO TABS
1.0000 | ORAL_TABLET | Freq: Every day | ORAL | 3 refills | Status: DC
  Filled 2021-07-21: qty 40, 40d supply, fill #0

## 2021-07-21 MED ORDER — DOXEPIN HCL 50 MG PO CAPS
50.0000 mg | ORAL_CAPSULE | Freq: Every day | ORAL | 2 refills | Status: DC
  Filled 2021-07-21: qty 30, 30d supply, fill #0

## 2021-07-21 MED ORDER — GABAPENTIN 300 MG PO CAPS
300.0000 mg | ORAL_CAPSULE | Freq: Four times a day (QID) | ORAL | 0 refills | Status: DC | PRN
  Filled 2021-07-21: qty 90, 23d supply, fill #0

## 2021-07-21 MED ORDER — CLONIDINE HCL 0.1 MG PO TABS: 0.1000 mg | ORAL_TABLET | Freq: Once | ORAL | Status: DC

## 2021-07-21 NOTE — Telephone Encounter (Signed)
Patient requesting Dr. Note reflecting she was was seen today in office and dx with scabies. Patient states her AVS does not reflect dx and she has to provide documentation to [facility])

## 2021-07-21 NOTE — Telephone Encounter (Signed)
Requested Prescriptions  Pending Prescriptions Disp Refills  . gabapentin (NEURONTIN) 300 MG capsule 90 capsule 0    Sig: Take 1 capsule (300 mg total) by mouth 4 (four) times daily as needed.     Neurology: Anticonvulsants - gabapentin Passed - 07/21/2021 10:26 AM      Passed - Valid encounter within last 12 months    Recent Outpatient Visits          Today Hypertensive urgency   [facility] [name], MD   2 months ago Hypertensive urgency   [facility] [name], MD   4 months ago Primary hypertension   [facility] [name], MD   7 months ago Hypertension, unspecified type   [facility] [place], Marzella Schlein, [place]      Future Appointments            In 1 month Delford Field Charlcie Cradle, MD [facility]

## 2021-07-21 NOTE — Assessment & Plan Note (Signed)
1 dose of clonidine given 0.1 mg with minimal effect  Concerned about overshoot on this so did not give additional medications  Resume valsartan at reduced dose 160/25 daily  Return to see clinical pharmacy 1 week Dr. Delford Field 6 weeks  Smoking cessation stressed

## 2021-07-21 NOTE — Assessment & Plan Note (Signed)
Patient is concerned she may have scabies again its difficult to discern this is the case I will give a repeat treatment of permethrin and begin doxepin 50 mg at bedtime to see if this will help with the pruritus which may be psychogenic

## 2021-07-21 NOTE — Assessment & Plan Note (Signed)
  .   Current smoking consumption amount: 1/2 pack a day  . Dicsussion on advise to quit smoking and smoking impacts: Cardiovascular impacts  . Patient's willingness to quit: Uncertain  . Methods to quit smoking discussed: Behavioral modification nicotine replacement  . Medication management of smoking session drugs discussed: Nicotine replacement  . Resources provided:  AVS   . Setting quit date not established  . Follow-up arranged 1 week   Time spent counseling the patient: 5 minutes

## 2021-07-21 NOTE — Patient Instructions (Addendum)
Call the cancer control program to apply for free mammogram and pap smear, they will order the studies once approved The number is:   [phone]      Clonidine point 1 mg was given for high blood pressure  Reduce valsartan HCT to 160/25 daily new prescription sent to our pharmacy  Begin doxepin 50 mg at bedtime to help with itching  Permethrin cream was prescribed for the scabies this may be repeated x1  Flu vaccine was given  Focus on smoking cessation  Colon cancer screening kit issued please process bring back to the clinic  Return to see 8 clinical pharmacy in 2 weeks Dr. Joya Gaskins in 6 weeks

## 2021-07-21 NOTE — Assessment & Plan Note (Signed)
Patient denies current use

## 2021-07-21 NOTE — Assessment & Plan Note (Signed)
Management per mental health 

## 2021-07-22 ENCOUNTER — Other Ambulatory Visit: Payer: Self-pay

## 2021-07-22 ENCOUNTER — Encounter: Payer: Self-pay | Admitting: Critical Care Medicine

## 2021-07-22 NOTE — Telephone Encounter (Signed)
Pt was called and is aware of note. I will it leave with front desk when she comes todasy

## 2021-07-22 NOTE — Telephone Encounter (Signed)
Letter completed.  Carly pls call the pt and get the letter to her

## 2021-07-29 ENCOUNTER — Other Ambulatory Visit: Payer: Self-pay

## 2021-07-31 ENCOUNTER — Other Ambulatory Visit: Payer: Self-pay

## 2021-08-04 ENCOUNTER — Other Ambulatory Visit: Payer: Self-pay

## 2021-08-04 ENCOUNTER — Ambulatory Visit: Payer: Self-pay | Attending: Family Medicine | Admitting: Family Medicine

## 2021-08-04 ENCOUNTER — Encounter: Payer: Self-pay | Admitting: Family Medicine

## 2021-08-04 VITALS — BP 182/116 | HR 80 | Ht 63.0 in | Wt 129.8 lb

## 2021-08-04 DIAGNOSIS — I10 Essential (primary) hypertension: Secondary | ICD-10-CM

## 2021-08-04 DIAGNOSIS — B86 Scabies: Secondary | ICD-10-CM

## 2021-08-04 MED ORDER — PERMETHRIN 5 % EX CREA
1.0000 "application " | TOPICAL_CREAM | Freq: Once | CUTANEOUS | 1 refills | Status: AC
  Filled 2021-08-04: qty 120, 6d supply, fill #0
  Filled 2021-08-08: qty 120, 6d supply, fill #1

## 2021-08-04 NOTE — Progress Notes (Signed)
Subjective:  Patient ID: Ariana Jensen, female    DOB: February 28, 1966  Age: 55 y.o. MRN: [id number]  CC: Hypertension   HPI Ariana Jensen is a 55 y.o. year old female with a history of hypertension, bipolar depression, opioid dependence (on chronic methadone) here for an acute visit.  Interval History: " I have got bugs and they are everywhere - in my hair everywhere". Ariana Jensen complains of pruritus which has been present for the last month and is driving her crazy, also occurring at night.. Ariana Jensen cleaned her house and has been treated with permethrin with no resolution of symptoms.  Ariana Jensen does have a roommate who has his own room and Ariana Jensen does not think he has similar symptoms.  Her blood pressure is significantly elevated today and Ariana Jensen is yet to take her antihypertensives.  At her last visit with PCP 3 months ago Ariana Jensen did receive clonidine and was referred to the clinical pharmacist for follow-up on her blood pressure but Ariana Jensen never followed up.  Past Medical History:  Diagnosis Date   Anxiety    ASCUS (atypical squamous cells of undetermined significance) on Pap smear    Body lice 08/05/2015   Chronic lower back pain    Cocaine abuse (HCC)    crack cocaine   Depression    Fibromyalgia    GERD (gastroesophageal reflux disease)    Hepatitis C    Hepatitis C antibody test positive    Hypertension    Hypokalemia 03/04/2018   Leukocytosis 03/04/2018   Opioid dependence (HCC)    PVC's (premature ventricular contractions) 03/04/2018   Scabies 05/13/2021   Seizures (HCC)    "don't know why; I've had 2 in the past year" (08/18/2016)   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   Trichomonas 04/17/2011   Diagnosed 04/02/11 during hospitalization, treated with Flagyl 2g, patient instructed to have partner treated (must follow-up)    Tuberculosis    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 age 55, now s/p 1-yr of multidrug treatment (dates unknown)    Past  Surgical History:  Procedure Laterality Date   CESAREAN SECTION  1984   DILATION AND CURETTAGE OF UTERUS     I & D EXTREMITY  10/18/2011   Procedure: IRRIGATION AND DEBRIDEMENT EXTREMITY;  Surgeon: Sharma Covert, MD;  Location: [facility] OR;  Service: Orthopedics;  Laterality: Left;   I & D EXTREMITY  10/21/2011   Procedure: IRRIGATION AND DEBRIDEMENT EXTREMITY;  Surgeon: Sharma Covert, MD;  Location: [facility];  Service: Orthopedics;  Laterality: Left;   SALPINGECTOMY Left March 2010   ectopic pregnancy    Family History  Problem Relation Age of Onset   Heart attack Mother    Heart attack Father    Heart attack Paternal Grandmother    Heart attack Paternal Grandfather    Cirrhosis Brother     No Known Allergies  Outpatient Medications Prior to Visit  Medication Sig Dispense Refill   divalproex (DEPAKOTE ER) 500 MG 24 hr tablet Take 1 tablet (500 mg total) by mouth every evening. 30 tablet 6   doxepin (SINEQUAN) 50 MG capsule Take 1 capsule (50 mg total) by mouth at bedtime. 30 capsule 2   gabapentin (NEURONTIN) 300 MG capsule Take 1 capsule (300 mg total) by mouth 4 (four) times daily as needed. 90 capsule 0   methadone (DOLOPHINE) 10 MG/ML solution Take 95 mg by mouth daily.     valsartan-hydrochlorothiazide (DIOVAN-HCT) 160-25 MG tablet Take 1 tablet by mouth daily.  40 tablet 3   Facility-Administered Medications Prior to Visit  Medication Dose Route Frequency Provider Last Rate Last Admin   cloNIDine (CATAPRES) tablet 0.1 mg  0.1 mg Oral Once [name], MD         ROS Review of Systems  Constitutional:  Negative for activity change, appetite change and fatigue.  HENT:  Negative for congestion, sinus pressure and sore throat.   Eyes:  Negative for visual disturbance.  Respiratory:  Negative for cough, chest tightness, shortness of breath and wheezing.   Cardiovascular:  Negative for chest pain and palpitations.  Gastrointestinal:  Negative for abdominal distention,  abdominal pain and constipation.  Endocrine: Negative for polydipsia.  Genitourinary:  Negative for dysuria and frequency.  Musculoskeletal:  Negative for arthralgias and back pain.  Skin:  Positive for rash.  Neurological:  Negative for tremors, light-headedness and numbness.  Hematological:  Does not bruise/bleed easily.  Psychiatric/Behavioral:  Negative for agitation and behavioral problems.    Objective:  BP (!) 182/116 (BP Location: Left Arm, Patient Position: Sitting, Cuff Size: Normal)   Pulse 80   Ht 5\' 3"  (1.6 m)   Wt 129 lb 12.8 oz (58.9 kg)   LMP 12/06/2015   SpO2 97%   BMI 22.99 kg/m   BP/Weight 08/04/2021 07/21/2021 05/20/2021  Systolic BP 182 203 183  Diastolic BP 116 124 97  Wt. (Lbs) 129.8 128.8 134.6  BMI 22.99 22.82 23.84  Some encounter information is confidential and restricted. Go to Review Flowsheets activity to see all data.      Physical Exam Constitutional:      Appearance: Ariana Jensen is well-developed.  Cardiovascular:     Rate and Rhythm: Normal rate.     Heart sounds: Normal heart sounds. No murmur heard. Pulmonary:     Effort: Pulmonary effort is normal.     Breath sounds: Normal breath sounds. No wheezing or rales.  Chest:     Chest wall: No tenderness.  Abdominal:     General: Bowel sounds are normal. There is no distension.     Palpations: Abdomen is soft. There is no mass.     Tenderness: There is no abdominal tenderness.  Musculoskeletal:        General: Normal range of motion.     Right lower leg: No edema.     Left lower leg: No edema.  Skin:    Comments: Erythematous rash on extremities  Neurological:     Mental Status: Ariana Jensen is alert and oriented to person, place, and time.  Psychiatric:        Mood and Affect: Mood normal.    CMP Latest Ref Rng & Units 05/20/2021 04/07/2021 04/07/2021  Glucose 65 - 99 mg/dL 04/09/2021) - -  BUN 6 - 24 mg/dL 13 - -  Creatinine 953(U - 1.00 mg/dL 0.23 - -  Sodium 3.43 - 144 mmol/L 139 - -  Potassium 3.5 -  5.2 mmol/L 4.7 - -  Chloride 96 - 106 mmol/L 101 - -  CO2 20 - 29 mmol/L 25 - -  Calcium 8.7 - 10.2 mg/dL 9.7 - -  Total Protein 6.1 - 8.1 g/dL - 7.1 -  Total Bilirubin 0.2 - 1.2 mg/dL - 0.3 -  Alkaline Phos 44 - 121 IU/L - - -  AST 10 - 35 U/L - 18 -  ALT 6 - 29 U/L - 12 12    Lipid Panel     Component Value Date/Time   CHOL 193 03/20/2021 1027  TRIG 193 (H) 03/20/2021 1027   HDL 51 03/20/2021 1027   CHOLHDL 3.8 03/20/2021 1027   CHOLHDL 3.3 06/22/2018 1823   VLDL 43 (H) 06/22/2018 1823   LDLCALC 109 (H) 03/20/2021 1027    CBC    Component Value Date/Time   WBC 7.2 03/20/2021 1027   WBC 8.9 11/01/2020 0908   RBC 4.15 03/20/2021 1027   RBC 4.57 11/01/2020 0908   HGB 12.8 03/20/2021 1027   HCT 38.9 03/20/2021 1027   PLT 228 03/20/2021 1027   MCV 94 03/20/2021 1027   MCH 30.8 03/20/2021 1027   MCH 31.7 11/01/2020 0908   MCHC 32.9 03/20/2021 1027   MCHC 32.7 11/01/2020 0908   RDW 12.6 03/20/2021 1027   LYMPHSABS 2.1 03/20/2021 1027   MONOABS 0.4 07/18/2018 1833   EOSABS 0.2 03/20/2021 1027   BASOSABS 0.1 03/20/2021 1027    Lab Results  Component Value Date   HGBA1C 5.5 03/20/2021    Assessment & Plan:  1. Scabies Uncontrolled Will provide repeat treatment for scabies Discussed care of beddings Ariana Jensen will need to use an exterminator to ensure complete eradication - permethrin (ELIMITE) 5 % cream; Apply 1 application topically once for 1 dose. From head to toe and leave in for 8 to 14 hours then wash off.  Repeat in 72 hours  Dispense: 120 g; Refill: 1  2. Essential hypertension Uncontrolled Yet to take antihypertensive hence I will make no changes today. Compliance strongly encouraged Referred to clinical pharmacist for follow-up of blood pressure and regimen modification Ariana Jensen will keep her appointment with her PCP.    Meds ordered this encounter  Medications   permethrin (ELIMITE) 5 % cream    Sig: Apply 1 application topically once for 1 dose. From  head to toe and leave in for 8 to 14 hours then wash off.  Repeat in 72 hours    Dispense:  120 g    Refill:  1    Follow-up: Return in about 1 week (around 08/11/2021) for Blood pressure follow-up with Franky Macho.       Hoy Register, MD, FAAFP. [facility] [place], [place] [phone]   08/04/2021, 10:01 AM

## 2021-08-04 NOTE — Patient Instructions (Signed)
Scabies, Adult °Scabies is a skin condition that happens when very small insects called mites get under the skin (infestation). This causes a rash and severe itchiness. Scabies is contagious, which means it can spread from person to person. If you get scabies, it is common for others in your household to get scabies too. °With proper treatment, symptoms usually go away in 2-4 weeks. Scabies usually does not cause lasting problems. °What are the causes? °This condition is caused by tiny mites (Sarcoptes scabiei, or human itch mites) that can only be seen with a microscope. The mites get into the top layer of skin and lay eggs. Scabies can spread from person to person through: °Close contact with a person who has scabies. °Sharing or having contact with infested items, such as towels, bedding, or clothing. °What increases the risk? °The following factors may make you more likely to develop this condition: °Living in a nursing home or other extended care facility. °Having sexual contact with a partner who has scabies. °Caring for others who are at increased risk for scabies. °What are the signs or symptoms? °Symptoms of this condition include: °Severe itchiness. This is often worse at night. °A rash that includes tiny red bumps or blisters. The rash commonly occurs on the hands, wrists, elbows, armpits, chest, waist, groin, or buttocks. The bumps may form a line (burrow) in some areas. °Skin irritation. This can include scaly patches or sores. °How is this diagnosed? °This condition may be diagnosed based on: °A physical exam of the skin. °A skin test. Your health care provider may take a sample of your affected skin (skin scraping) and have it examined under a microscope for signs of mites. °How is this treated? °This condition may be treated with: °Medicated cream or lotion that kills the mites. This is spread on the entire body and left on for several hours. Usually, one treatment with medicated cream or lotion is  enough to kill all the mites. In severe cases, the treatment may need to be repeated. °Medicated cream that relieves itching. °Medicines taken by mouth (orally) that: °Relieve itching. °Reduce the swelling and redness. °Kill the mites. This treatment may be done in severe cases. °Follow these instructions at home: °Medicines °Take or apply over-the-counter and prescription medicines only as told by your health care provider. °Apply medicated cream or lotion as told by your health care provider. °Do not wash off the medicated cream or lotion until the necessary amount of time has passed. °Skin care °Avoid scratching the affected areas of your skin. °Keep your fingernails closely trimmed to reduce injury from scratching. °Take cool baths or apply cool washcloths to your skin to help reduce itching. °General instructions °Clean all items that you had contact with during the 3 days before diagnosis. This includes bedding, clothing, towels, and furniture. Do this on the same day that you start treatment. °Dry-clean items, or use hot water to wash items. Dry items on the hot dry cycle. °Place items that cannot be washed into closed, airtight plastic bags for at least 3 days. The mites cannot live for more than 3 days away from human skin. °Vacuum furniture and mattresses that you use. °Make sure that other people who may have been infested are examined by a health care provider. These include members of your household and anyone who may have had contact with infested items. °Keep all follow-up visits. This is important. °Where to find more information °Centers for Disease Control and Prevention: www.cdc.gov °Contact a health   care provider if: °You have itching that does not go away after 4 weeks of treatment. °You continue to develop new bumps or burrows. °You have redness, swelling, or pain in your rash area after treatment. °You have fluid, blood, or pus coming from your rash. °Summary °Scabies is a skin condition that  causes a rash and severe itchiness. °This condition is caused by tiny mites that get into the top layer of the skin and lay eggs. °Scabies can spread from person to person. °Follow treatments as recommended by your health care provider. °Clean all items that you recently had contact with. °This information is not intended to replace advice given to you by your health care provider. Make sure you discuss any questions you have with your health care provider. °Document Revised: 01/05/2020 Document Reviewed: 01/05/2020 °Elsevier Patient Education © 2022 [employer] ° °

## 2021-08-05 ENCOUNTER — Other Ambulatory Visit: Payer: Self-pay

## 2021-08-06 ENCOUNTER — Other Ambulatory Visit: Payer: Self-pay

## 2021-08-08 ENCOUNTER — Other Ambulatory Visit: Payer: Self-pay

## 2021-08-11 ENCOUNTER — Other Ambulatory Visit: Payer: Self-pay

## 2021-08-12 ENCOUNTER — Other Ambulatory Visit: Payer: Self-pay

## 2021-09-03 ENCOUNTER — Ambulatory Visit: Payer: Medicaid Other | Admitting: Critical Care Medicine

## 2021-09-03 NOTE — Progress Notes (Deleted)
Established Patient Office Visit  Subjective:  Patient ID: Ariana Jensen, female    DOB: 10/14/1965  Age: 55 y.o. MRN: [id number]  CC: No chief complaint on file.   HPI Ariana Jensen presents for ***  Past Medical History:  Diagnosis Date   Anxiety    ASCUS (atypical squamous cells of undetermined significance) on Pap smear    Body lice 78/67/6720   Chronic lower back pain    Cocaine abuse (HCC)    crack cocaine   Depression    Fibromyalgia    GERD (gastroesophageal reflux disease)    Hepatitis C    Hepatitis C antibody test positive    Hypertension    Hypokalemia 03/04/2018   Leukocytosis 03/04/2018   Opioid dependence ([place])    PVC's (premature ventricular contractions) 03/04/2018   Scabies 05/13/2021   Seizures ([place])    "don't know why; I've had 2 in the past year" (08/18/2016)   Severe recurrent major depression without psychotic features ([place]) 06/21/2018   Trichomonas 04/17/2011   Diagnosed 04/02/11 during hospitalization, treated with Flagyl 2g, patient instructed to have partner treated (must follow-up)    Tuberculosis    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 age 63, now s/p 1-yr of multidrug treatment (dates unknown)    Past Surgical History:  Procedure Laterality Date   Force     I & D EXTREMITY  10/18/2011   Procedure: IRRIGATION AND DEBRIDEMENT EXTREMITY;  Surgeon: Linna Hoff, MD;  Location: [facility];  Service: Orthopedics;  Laterality: Left;   I & D EXTREMITY  10/21/2011   Procedure: IRRIGATION AND DEBRIDEMENT EXTREMITY;  Surgeon: Linna Hoff, MD;  Location: [facility];  Service: Orthopedics;  Laterality: Left;   SALPINGECTOMY Left March 2010   ectopic pregnancy    Family History  Problem Relation Age of Onset   Heart attack Mother    Heart attack Father    Heart attack Paternal Grandmother    Heart attack Paternal Grandfather    Cirrhosis Brother     Social History   Socioeconomic History    Marital status: Widowed    Spouse name: Not on file   Number of children: 1   Years of education: 7th grade   Highest education level: Not on file  Occupational History   Not on file  Tobacco Use   Smoking status: Every Day    Packs/day: 0.50    Years: 32.00    Pack years: 16.00    Types: Cigarettes   Smokeless tobacco: Never  Vaping Use   Vaping Use: Never used  Substance and Sexual Activity   Alcohol use: Yes    Alcohol/week: 0.0 standard drinks    Comment: occ   Drug use: Yes    Types: Oxycodone, Heroin, Cocaine, IV    Comment: Pt stated "I do heroin"   Sexual activity: Not Currently    Birth control/protection: None  Other Topics Concern   Not on file  Social History Narrative   Lives in [place]   Currently Unemployeed   Social Determinants of Health   Financial Resource Strain: Not on file  Food Insecurity: Not on file  Transportation Needs: Not on file  Physical Activity: Not on file  Stress: Not on file  Social Connections: Not on file  Intimate Partner Violence: Not on file    Outpatient Medications Prior to Visit  Medication Sig Dispense Refill   divalproex (DEPAKOTE ER) 500 MG 24 hr  tablet Take 1 tablet (500 mg total) by mouth every evening. 30 tablet 6   doxepin (SINEQUAN) 50 MG capsule Take 1 capsule (50 mg total) by mouth at bedtime. 30 capsule 2   gabapentin (NEURONTIN) 300 MG capsule Take 1 capsule (300 mg total) by mouth 4 (four) times daily as needed. 90 capsule 0   methadone (DOLOPHINE) 10 MG/ML solution Take 95 mg by mouth daily.     valsartan-hydrochlorothiazide (DIOVAN-HCT) 160-25 MG tablet Take 1 tablet by mouth daily. 40 tablet 3   Facility-Administered Medications Prior to Visit  Medication Dose Route Frequency Provider Last Rate Last Admin   cloNIDine (CATAPRES) tablet 0.1 mg  0.1 mg Oral Once Elsie Stain, MD        No Known Allergies  ROS Review of Systems    Objective:    Physical Exam  LMP 12/06/2015  Wt  Readings from Last 3 Encounters:  08/04/21 129 lb 12.8 oz (58.9 kg)  07/21/21 128 lb 12.8 oz (58.4 kg)  05/20/21 134 lb 9.6 oz (61.1 kg)     Health Maintenance Due  Topic Date Due   Pneumococcal Vaccine 45-40 Years old (1 - PCV) Never done   COLON CANCER SCREENING ANNUAL FOBT  Never done   MAMMOGRAM  Never done   Zoster Vaccines- Shingrix (1 of 2) Never done   PAP SMEAR-Modifier  04/09/2018   COVID-19 Vaccine (3 - Booster for Margaret series) 03/11/2020    There are no preventive care reminders to display for this patient.  Lab Results  Component Value Date   TSH 0.675 05/20/2021   Lab Results  Component Value Date   WBC 7.2 03/20/2021   HGB 12.8 03/20/2021   HCT 38.9 03/20/2021   MCV 94 03/20/2021   PLT 228 03/20/2021   Lab Results  Component Value Date   NA 139 05/20/2021   K 4.7 05/20/2021   CO2 25 05/20/2021   GLUCOSE 100 (H) 05/20/2021   BUN 13 05/20/2021   CREATININE 0.66 05/20/2021   BILITOT 0.3 04/07/2021   ALKPHOS 69 03/20/2021   AST 18 04/07/2021   ALT 12 04/07/2021   ALT 12 04/07/2021   PROT 7.1 04/07/2021   ALBUMIN 4.0 03/20/2021   CALCIUM 9.7 05/20/2021   ANIONGAP 8 11/01/2020   EGFR 105 05/20/2021   Lab Results  Component Value Date   CHOL 193 03/20/2021   Lab Results  Component Value Date   HDL 51 03/20/2021   Lab Results  Component Value Date   LDLCALC 109 (H) 03/20/2021   Lab Results  Component Value Date   TRIG 193 (H) 03/20/2021   Lab Results  Component Value Date   CHOLHDL 3.8 03/20/2021   Lab Results  Component Value Date   HGBA1C 5.5 03/20/2021      Assessment & Plan:   Problem List Items Addressed This Visit   None   No orders of the defined types were placed in this encounter.   Follow-up: No follow-ups on file.    Ariana Noble, MD

## 2021-09-10 ENCOUNTER — Other Ambulatory Visit: Payer: Self-pay

## 2021-09-10 DIAGNOSIS — Z1231 Encounter for screening mammogram for malignant neoplasm of breast: Secondary | ICD-10-CM

## 2021-09-30 ENCOUNTER — Ambulatory Visit: Payer: Self-pay

## 2021-09-30 NOTE — Telephone Encounter (Signed)
°  Chief Complaint: rash Symptoms: rash all over body Frequency: 2 weeks Pertinent Negatives: Patient denies fever Disposition: [] ED /[] Urgent Care (no appt availability in office) / [] Appointment(In office/virtual)/ []  [facility]/ [] Home Care/ [] Refused Recommended Disposition /[x] [facility]/ []  Follow-up with PCP Additional Notes: Pt called, advised no appts in office but can go to mobile unit that will be at practice location tomorrow. Pt states she will be there at 0800.    Summary: Body Rash for 2 weeks   Patient experiencing body rash for 2 weeks, irration, itching and painful. Patient has been taking benadryl and cream not working. Practice has no available appointments.      Reason for Disposition  SEVERE itching (i.e., interferes with sleep, normal activities or school)  Answer Assessment - Initial Assessment Questions 1. APPEARANCE of RASH: "Describe the rash." (e.g., spots, blisters, raised areas, skin peeling, scaly)     Blisters sores 2. SIZE: "How big are the spots?" (e.g., tip of pen, eraser, coin; inches, centimeters)     Eraser pencil  3. LOCATION: "Where is the rash located?"     All over the body 4. COLOR: "What color is the rash?" (Note: It is difficult to assess rash color in people with darker-colored skin. When this situation occurs, simply ask the caller to describe what they see.)     Red and puffy in middle  5. ONSET: "When did the rash begin?"     2 weeks 6. FEVER: "Do you have a fever?" If Yes, ask: "What is your temperature, how was it measured, and when did it start?"     No 7. ITCHING: "Does the rash itch?" If Yes, ask: "How bad is the itch?" (Scale 1-10; or mild, moderate, severe)     Yes 9. MEDICINE FACTORS: "Have you started any new medicines within the last 2 weeks?" (e.g., antibiotics)      No  10. OTHER SYMPTOMS: "Do you have any other symptoms?" (e.g., dizziness, headache, sore throat, joint pain)       No  Protocols  used: Rash or Redness - [facility]

## 2021-10-01 ENCOUNTER — Other Ambulatory Visit: Payer: Self-pay

## 2021-10-01 ENCOUNTER — Ambulatory Visit: Payer: Self-pay | Admitting: Physician Assistant

## 2021-10-01 DIAGNOSIS — B86 Scabies: Secondary | ICD-10-CM

## 2021-10-01 MED ORDER — PERMETHRIN 5 % EX CREA
1.0000 "application " | TOPICAL_CREAM | Freq: Once | CUTANEOUS | 1 refills | Status: DC
  Filled 2021-10-01: qty 60, 30d supply, fill #0
  Filled 2021-10-06: qty 60, 5d supply, fill #1

## 2021-10-01 NOTE — Patient Instructions (Signed)
I sent the prescription to your pharmacy for you.  I do encourage you to possibly reach out to pest control to see if they can offer some relief.  I also encourage you to reach out to your veterinarian to see if it is reasonable for you to treat your dog with over-the-counter versions of this medication.  I hope that you feel better soon, please let us know there is anything else we can do for you  Roney Jaffe, PA-C Physician Assistant [facility] [email or web address]   Scabies, Adult Scabies is a skin condition that happens when very small insects called mites get under the skin (infestation). This causes a rash and severe itchiness. Scabies is contagious, which means it can spread from person to person. If you get scabies, it is common for others in your household to get scabies too. With proper treatment, symptoms usually go away in 2-4 weeks. Scabies usually does not cause lasting problems. What are the causes? This condition is caused by tiny mites (Sarcoptes scabiei, or human itch mites) that can only be seen with a microscope. The mites get into the top layer of skin and lay eggs. Scabies can spread from person to person through: Close contact with a person who has scabies. Sharing or having contact with infested items, such as towels, bedding, or clothing. What increases the risk? The following factors may make you more likely to develop this condition: Living in a nursing home or other extended care facility. Having sexual contact with a partner who has scabies. Caring for others who are at increased risk for scabies. What are the signs or symptoms? Symptoms of this condition include: Severe itchiness. This is often worse at night. A rash that includes tiny red bumps or blisters. The rash commonly occurs on the hands, wrists, elbows, armpits, chest, waist, groin, or buttocks. The bumps may form a line (burrow) in some  areas. Skin irritation. This can include scaly patches or sores. How is this diagnosed? This condition may be diagnosed based on: A physical exam of the skin. A skin test. Your health care provider may take a sample of your affected skin (skin scraping) and have it examined under a microscope for signs of mites. How is this treated? This condition may be treated with: Medicated cream or lotion that kills the mites. This is spread on the entire body and left on for several hours. Usually, one treatment with medicated cream or lotion is enough to kill all the mites. In severe cases, the treatment may need to be repeated. Medicated cream that relieves itching. Medicines taken by mouth (orally) that: Relieve itching. Reduce the swelling and redness. Kill the mites. This treatment may be done in severe cases. Follow these instructions at home: Medicines Take or apply over-the-counter and prescription medicines only as told by your health care provider. Apply medicated cream or lotion as told by your health care provider. Do not wash off the medicated cream or lotion until the necessary amount of time has passed. Skin care Avoid scratching the affected areas of your skin. Keep your fingernails closely trimmed to reduce injury from scratching. Take cool baths or apply cool washcloths to your skin to help reduce itching. General instructions Clean all items that you had contact with during the 3 days before diagnosis. This includes bedding, clothing, towels, and furniture. Do this on the same day that you start treatment. Dry-clean items, or use hot water to wash items. Dry items on the hot  dry cycle. Place items that cannot be washed into closed, airtight plastic bags for at least 3 days. The mites cannot live for more than 3 days away from human skin. Vacuum furniture and mattresses that you use. Make sure that other people who may have been infested are examined by a health care provider.  These include members of your household and anyone who may have had contact with infested items. Keep all follow-up visits. This is important. Where to find more information Centers for Disease Control and Prevention: [employer] Contact a health care provider if: You have itching that does not go away after 4 weeks of treatment. You continue to develop new bumps or burrows. You have redness, swelling, or pain in your rash area after treatment. You have fluid, blood, or pus coming from your rash. Summary Scabies is a skin condition that causes a rash and severe itchiness. This condition is caused by tiny mites that get into the top layer of the skin and lay eggs. Scabies can spread from person to person. Follow treatments as recommended by your health care provider. Clean all items that you recently had contact with. This information is not intended to replace advice given to you by your health care provider. Make sure you discuss any questions you have with your health care provider. Document Revised: 01/05/2020 Document Reviewed: 01/05/2020 Elsevier Patient Education  2022 [employer].

## 2021-10-01 NOTE — Progress Notes (Signed)
Established Patient Office Visit  Subjective:  Patient ID: Ariana Jensen, female    DOB: [birth year]  Age: 56 y.o. MRN: [id number]  CC:  Chief Complaint  Patient presents with   Rash    HPI Ariana Jensen states that she believes she is having an outbreak of scabies once again.  Reports that she started having itchy spots over her entire body approximately 1 week ago.  Reports that she tried an over-the-counter cream without relief.  Reports that she has been treated twice before for scabies, May 12, 2021 and August 04, 2021.  States that both treatments did offer relief, and she took measures to rid her home of potential scabies, states that she is very frustrated because she does not know why they keep coming back.  Denies any new fragrances, body washes, detergents, lotions, shampoos, medications.  Does endorse pet in-home   Past Medical History:  Diagnosis Date   Anxiety    ASCUS (atypical squamous cells of undetermined significance) on Pap smear    Body lice 09/38/1829   Chronic lower back pain    Cocaine abuse (HCC)    crack cocaine   Depression    Fibromyalgia    GERD (gastroesophageal reflux disease)    Hepatitis C    Hepatitis C antibody test positive    Hypertension    Hypokalemia 03/04/2018   Leukocytosis 03/04/2018   Opioid dependence ([place])    PVC's (premature ventricular contractions) 03/04/2018   Scabies 05/13/2021   Seizures ([place])    "don't know why; I've had 2 in the past year" (08/18/2016)   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;  Service: Orthopedics;  Laterality: Left;   I & D EXTREMITY  10/21/2011   Procedure: IRRIGATION AND DEBRIDEMENT EXTREMITY;  Surgeon: Linna Hoff, MD;  Location: [place];  Service: Orthopedics;  Laterality: Left;   SALPINGECTOMY Left March 2010   ectopic pregnancy    Family History  Problem Relation Age of Onset   Heart attack Mother    Heart attack Father    Heart attack Paternal Grandmother    Heart attack Paternal Grandfather    Cirrhosis Brother     Social History   Socioeconomic History   Marital status: Widowed    Spouse name: Not on file   Number of children: 1   Years of education: 7th grade   Highest education level: Not on file  Occupational History   Not on file  Tobacco Use   Smoking status: Every Day    Packs/day: 0.50    Years: 32.00    Pack years: 16.00    Types: Cigarettes   Smokeless tobacco: Never  Vaping Use   Vaping Use: Never used  Substance and Sexual Activity   Alcohol use: Yes    Alcohol/week: 0.0 standard drinks    Comment: occ   Drug use: Yes    Types: Oxycodone, Heroin, Cocaine, IV  Comment: Pt stated "I do heroin"   Sexual activity: Not Currently    Birth control/protection: None  Other Topics Concern   Not on file  Social History Narrative   Lives in [place]   Currently Unemployeed   Social Determinants of Health   Financial Resource Strain: Not on file  Food Insecurity: Not on file  Transportation Needs: Not on file  Physical Activity: Not on file  Stress: Not on file  Social Connections: Not on file  Intimate Partner Violence: Not on file    Outpatient Medications Prior to Visit  Medication Sig Dispense Refill   divalproex (DEPAKOTE ER) 500 MG 24 hr tablet Take 1 tablet (500 mg total) by mouth every evening. 30 tablet 6   doxepin (SINEQUAN) 50 MG capsule Take 1 capsule (50 mg total) by  mouth at bedtime. 30 capsule 2   gabapentin (NEURONTIN) 300 MG capsule Take 1 capsule (300 mg total) by mouth 4 (four) times daily as needed. 90 capsule 0   methadone (DOLOPHINE) 10 MG/ML solution Take 95 mg by mouth daily.     valsartan-hydrochlorothiazide (DIOVAN-HCT) 160-25 MG tablet Take 1 tablet by mouth daily. 40 tablet 3   Facility-Administered Medications Prior to Visit  Medication Dose Route Frequency Provider Last Rate Last Admin   cloNIDine (CATAPRES) tablet 0.1 mg  0.1 mg Oral Once [name], MD        No Known Allergies  ROS Review of Systems  Constitutional:  Negative for chills and fever.  HENT: Negative.    Eyes: Negative.   Respiratory:  Negative for shortness of breath.   Cardiovascular:  Negative for chest pain.  Gastrointestinal: Negative.   Endocrine: Negative.   Genitourinary: Negative.   Musculoskeletal: Negative.   Skin:  Positive for rash.  Allergic/Immunologic: Negative.   Neurological: Negative.   Hematological: Negative.   Psychiatric/Behavioral: Negative.       Objective:    Physical Exam Nursing note reviewed.  Constitutional:      General: She is not in acute distress.    Appearance: Normal appearance. She is not ill-appearing.     Comments: Due to nature of rash, patient was examined in the parking lot, a full physical exam was not completed.  HENT:     Head: Normocephalic and atraumatic.     Right Ear: External ear normal.     Left Ear: External ear normal.     Nose: Nose normal.     Mouth/Throat:     Mouth: Mucous membranes are moist.     Pharynx: Oropharynx is clear.  Eyes:     Extraocular Movements: Extraocular movements intact.     Conjunctiva/sclera: Conjunctivae normal.     Pupils: Pupils are equal, round, and reactive to light.  Musculoskeletal:        General: Normal range of motion.  Skin:    Findings: Rash present.     Comments: See photo  Neurological:     General: No focal deficit present.     Mental Status:  She is alert and oriented to person, place, and time.  Psychiatric:        Mood and Affect: Mood normal.        Behavior: Behavior normal.        Thought Content: Thought content normal.        Judgment: Judgment normal.         LMP 12/06/2015  Wt Readings from Last 3 Encounters:  08/04/21 129 lb 12.8 oz (58.9 kg)  07/21/21 128 lb 12.8 oz (58.4 kg)  05/20/21 134 lb 9.6 oz (61.1 kg)     Health Maintenance Due  Topic Date Due   Pneumococcal Vaccine 41-68 Years old (1 - PCV) Never done   COLON CANCER SCREENING ANNUAL FOBT  Never done   MAMMOGRAM  Never done   Zoster Vaccines- Shingrix (1 of 2) Never done   PAP SMEAR-Modifier  04/09/2018   COVID-19 Vaccine (3 - Booster for Pfizer series) 03/11/2020    There are no preventive care reminders to display for this patient.  Lab Results  Component Value Date   TSH 0.675 05/20/2021   Lab Results  Component Value Date   WBC 7.2 03/20/2021   HGB 12.8 03/20/2021   HCT 38.9 03/20/2021   MCV 94 03/20/2021   PLT 228 03/20/2021   Lab Results  Component Value Date   NA 139 05/20/2021   K 4.7 05/20/2021   CO2 25 05/20/2021   GLUCOSE 100 (H) 05/20/2021   BUN 13 05/20/2021   CREATININE 0.66 05/20/2021   BILITOT 0.3 04/07/2021   ALKPHOS 69 03/20/2021   AST 18 04/07/2021   ALT 12 04/07/2021   ALT 12 04/07/2021   PROT 7.1 04/07/2021   ALBUMIN 4.0 03/20/2021   CALCIUM 9.7 05/20/2021   ANIONGAP 8 11/01/2020   EGFR 105 05/20/2021   Lab Results  Component Value Date   CHOL 193 03/20/2021   Lab Results  Component Value Date   HDL 51 03/20/2021   Lab Results  Component Value Date   LDLCALC 109 (H) 03/20/2021   Lab Results  Component Value Date   TRIG 193 (H) 03/20/2021   Lab Results  Component Value Date   CHOLHDL 3.8 03/20/2021   Lab Results  Component Value Date   HGBA1C 5.5 03/20/2021      Assessment & Plan:   Problem List Items Addressed This Visit   None Visit Diagnoses     Scabies    -  Primary    Relevant Medications   permethrin (ELIMITE) 5 % cream       Meds ordered this encounter  Medications   permethrin (ELIMITE) 5 % cream    Sig: Apply 1 application topically once for 1 dose.    Dispense:  120 g    Refill:  1    Order Specific Question:   Supervising Provider    Answer:   WRIGHT, PATRICK E [[id number]]  1. Scabies Patient education given on supportive care, patient encouraged to consider having professional pest control, veterinary visit for in-home pet.  Red flags given for prompt reevaluation - permethrin (ELIMITE) 5 % cream; Apply 1 application topically once for 1 dose.  Dispense: 120 g; Refill: 1   I have reviewed the patient's medical history (PMH, PSH, Social History, Family History, Medications, and allergies) , and have been updated if relevant. I spent 320 minutes reviewing chart and  face to face time with patient.   Follow-up: Return if symptoms worsen or fail to improve.    Loraine Grip Mayers, PA-C

## 2021-10-02 ENCOUNTER — Other Ambulatory Visit: Payer: Self-pay

## 2021-10-06 ENCOUNTER — Other Ambulatory Visit: Payer: Self-pay

## 2021-10-07 ENCOUNTER — Other Ambulatory Visit: Payer: Self-pay

## 2021-10-08 ENCOUNTER — Other Ambulatory Visit: Payer: Self-pay

## 2021-10-08 ENCOUNTER — Ambulatory Visit: Payer: Self-pay | Attending: Critical Care Medicine | Admitting: Physician Assistant

## 2021-10-08 VITALS — BP 187/152 | HR 70 | Temp 98.1°F | Resp 16 | Ht 63.0 in | Wt 128.0 lb

## 2021-10-08 DIAGNOSIS — I10 Essential (primary) hypertension: Secondary | ICD-10-CM

## 2021-10-08 DIAGNOSIS — L299 Pruritus, unspecified: Secondary | ICD-10-CM

## 2021-10-08 MED ORDER — GABAPENTIN 300 MG PO CAPS
300.0000 mg | ORAL_CAPSULE | Freq: Four times a day (QID) | ORAL | 0 refills | Status: DC | PRN
  Filled 2021-10-08: qty 90, 23d supply, fill #0

## 2021-10-08 MED ORDER — DOXEPIN HCL 50 MG PO CAPS
50.0000 mg | ORAL_CAPSULE | Freq: Every day | ORAL | 2 refills | Status: DC
  Filled 2021-10-08: qty 30, 30d supply, fill #0

## 2021-10-08 MED ORDER — HYDROXYZINE PAMOATE 25 MG PO CAPS
25.0000 mg | ORAL_CAPSULE | Freq: Three times a day (TID) | ORAL | 1 refills | Status: DC | PRN
  Filled 2021-10-08: qty 90, 30d supply, fill #0

## 2021-10-08 MED ORDER — VALSARTAN-HYDROCHLOROTHIAZIDE 160-25 MG PO TABS
1.0000 | ORAL_TABLET | Freq: Every day | ORAL | 3 refills | Status: DC
  Filled 2021-10-08: qty 30, 30d supply, fill #0

## 2021-10-08 MED ORDER — CLONIDINE HCL 0.1 MG PO TABS
0.1000 mg | ORAL_TABLET | Freq: Once | ORAL | Status: AC
  Administered 2021-10-08: 0.1 mg via ORAL

## 2021-10-08 MED ORDER — TRIAMCINOLONE ACETONIDE 0.1 % EX CREA
1.0000 | TOPICAL_CREAM | Freq: Two times a day (BID) | CUTANEOUS | 0 refills | Status: DC
Start: 2021-10-08 — End: 2022-01-20
  Filled 2021-10-08: qty 45, 23d supply, fill #0

## 2021-10-08 MED ORDER — DIVALPROEX SODIUM ER 500 MG PO TB24
500.0000 mg | ORAL_TABLET | Freq: Every evening | ORAL | 6 refills | Status: DC
  Filled 2021-10-08: qty 30, 30d supply, fill #0

## 2021-10-08 NOTE — Patient Instructions (Signed)
Neurodermatitis °Neurodermatitis is an inflammation and thickening of the skin. It is caused by severe itchiness, which leads to repeated scratching and rubbing of the skin. It can happen anywhere on the body. Common places include the neck, head, arms, and legs. °Neurodermatitis may have mental or emotional (psychogenic) causes that may need treatment. Usually, this is a lifelong (chronic) condition, but in some cases it may go away on its own. °What are the causes? °This condition is caused by repeated scratching and rubbing of the skin. This happens because of feelings of severe itchiness. Often, the cause of itchiness is not known. Common causes include: °Materials or substances that irritate the skin. °Skin conditions, such as chronic dermatitis or eczema. °Allergic reaction. °In some cases, neurodermatitis may have psychogenic causes, like anxiety or stress, that may need to be treated. °What increases the risk? °You are more likely to develop this condition if: °You have dry skin or other skin conditions. °You have an anxiety disorder or a lot of stress. °You are 30-50 years old. °You are a woman. °You are exposed to irritating chemicals. °What are the signs or symptoms? °The main symptom of this condition is one or more patches of skin that are red, swollen, itchy, and thicker than normal. °These patches can be anywhere on the body, but are often on the head, neck, legs, and arms. °This condition can also affect the genital and anal areas. °In severe cases, bleeding, crusting, and scaling of the skin can occur. °Symptoms often come and go over time. The frequency and severity of symptoms varies from person to person. °How is this diagnosed? °This condition is diagnosed based on your medical history and a physical exam of your skin. You may be referred to a health care provider who specializes in skin care (dermatologist). You may also have tests, including: °Skin allergy tests. These tests will determine if  you have an allergy that is causing your condition. °Blood or other lab tests. These tests can help to determine if your itching is caused by an infection or other condition. °In some cases, your health care provider may remove a small amount of skin cells to be examined under a microscope (biopsy). °How is this treated? °This condition is managed by stopping all scratching and rubbing of the affected area. It is also important to remove all skin irritants and treat any underlying causes of neurodermatitis. Depending on the cause, your health care provider may recommend certain treatments such as: °Creams, lotions, or pills to reduce inflammation and itching (corticosteroids). °Medicines to prevent or treat infection (antibiotics). °Medicines to relieve allergy symptoms (antihistamines). °Therapy to learn how to stop feelings of itchiness and stop scratching (behavioral therapy or psychotherapy). Your health care provider may recommend a doctor who specializes in human behavior (psychologist). °Phototherapy. °This condition sometimes goes away without treatment. °Follow these instructions at home: °Skin care ° °Avoid scratching and rubbing your skin. This is the best way to manage neurodermatitis and prevent it from returning. °Keep your skin clean and moisturized. °Avoid very hot water. °Apply lotion at least one time per day. °Avoid products, such as soaps and lotions, that have harsh chemicals, scents, and dyes. °Try to shower and take baths only as often as you need to. Frequent bathing can dry out your skin. °Avoid tight or rough clothing that irritates your skin. °Apply cool washcloths to your skin to help reduce itching. °General instructions °Use over-the-counter and prescription medicines only as told by your health care provider. °Drink   enough fluid to keep your urine pale yellow. °Avoid irritating chemicals. °Pay attention to your symptoms. Watch for things that trigger itching and scratching. °Keep your  fingernails cut short to reduce injury from scratching. °Keep all follow-up visits as told by your health care provider. This is important. °Contact a health care provider if: °Your condition gets worse or does not get better after 3-4 days of treatment. °You have blood or fluid leaking from an irritated patch of skin. °You have a fever. °Summary °Neurodermatitis is an inflammation and thickening of the skin. It is caused by severe itchiness, which leads to repeated scratching and rubbing of the skin. °Depending on the cause, your health care provider may recommend certain treatments such as creams, lotions, and pills to reduce inflammation and other medicines to treat infection, if needed. °Treatment may also include therapy to learn how to stop feelings of itchiness and stop scratching (behavioral therapy or psychotherapy). °Use over-the-counter and prescription medicines only as told by your health care provider. °Keep all follow-up visits as told by your health care provider. This is important. °This information is not intended to replace advice given to you by your health care provider. Make sure you discuss any questions you have with your health care provider. °Document Revised: 12/30/2018 Document Reviewed: 01/26/2018 °Elsevier Patient Education © 2022 [employer] ° °

## 2021-10-08 NOTE — Progress Notes (Signed)
Still have problems with mites- lice or scabies   Hip pain x 1 month 7/10  OTC Tylenol and Aleve No relief Relief with Neurontin

## 2021-10-08 NOTE — Progress Notes (Signed)
Patient ID: Ariana Jensen, female   DOB: [birth year], 56 y.o.   MRN: [id number]   Matelyn Mcfail, is a 56 y.o. female  [id number]  [id number]  DOB - 21-Oct-1965  No chief complaint on file.      Subjective:   Ariana Jensen is a 57 y.o. female here today for continued itching all over and still thinks she has scabies or lice.  This has been treated multiple times by multiple providers.  None of her contacts have had it but have all been treated.  She says she sees bugs in her hair and on her glasses when she removes them.  Itches all over.  Says she takes her BP meds but it does not look as though she has been picking up RF.  She denies illicit drug use and methadone is being weaned.  She has gotten rid of all bedding and even most furniture due to the infestations.  Her scalp is her biggest complaint today   Patient has No headache, No chest pain, No abdominal pain - No Nausea, No new weakness tingling or numbness, No Cough - SOB.  No problems updated.  ALLERGIES: No Known Allergies  PAST MEDICAL HISTORY: Past Medical History:  Diagnosis Date   Anxiety    ASCUS (atypical squamous cells of undetermined significance) on Pap smear    Body lice [id number]   Chronic lower back pain    Cocaine abuse (HCC)    crack cocaine   Depression    Fibromyalgia    GERD (gastroesophageal reflux disease)    Hepatitis C    Hepatitis C antibody test positive    Hypertension    Hypokalemia 03/04/2018   Leukocytosis 03/04/2018   Opioid dependence ([place])    PVC's (premature ventricular contractions) 03/04/2018   Scabies 05/13/2021   Seizures ([place])    "don't know why; I've had 2 in the past year" (08/18/2016)   Severe recurrent major depression without psychotic features ([place]) 06/21/2018   Trichomonas 04/17/2011   Diagnosed 04/02/11 during hospitalization, treated with Flagyl 2g, patient instructed to have partner treated (must follow-up)    Tuberculosis    Patient reports  contracting disease at age 31, now s/p 1-yr of multidrug treatment (dates unknown)    MEDICATIONS AT HOME: Prior to Admission medications   Medication Sig Start Date End Date Taking? Authorizing Provider  hydrOXYzine (VISTARIL) 25 MG capsule Take 1 capsule (25 mg total) by mouth every 8 (eight) hours as needed. For itching 10/08/21  Yes Argentina Donovan, PA-C  methadone (DOLOPHINE) 10 MG/ML solution Take 95 mg by mouth daily.   Yes [provider]  triamcinolone cream (KENALOG) 0.1 % Apply cream to affected area(s) 2 times daily. 10/08/21  Yes Argentina Donovan, PA-C  divalproex (DEPAKOTE ER) 500 MG 24 hr tablet Take 1 tablet (500 mg total) by mouth every evening. 10/08/21   Argentina Donovan, PA-C  doxepin (SINEQUAN) 50 MG capsule Take 1 capsule (50 mg total) by mouth at bedtime. 10/08/21   Argentina Donovan, PA-C  gabapentin (NEURONTIN) 300 MG capsule Take 1 capsule (300 mg total) by mouth 4 (four) times daily as needed. 10/08/21 10/08/22  Argentina Donovan, PA-C  valsartan-hydrochlorothiazide (DIOVAN-HCT) 160-25 MG tablet Take 1 tablet by mouth daily. 10/08/21   Marnell Mcdaniel, Dionne Bucy, PA-C    ROS: Neg HEENT Neg resp Neg cardiac Neg GI Neg GU Neg MS Neg psych Neg neuro  Objective:   Vitals:   10/08/21 1119 10/08/21 1159 10/08/21 1226  BP: Marland Kitchen)  199/120 (!) 199/120 (!) 187/152  Pulse: 70    Resp: 16    Temp: 98.1 F (36.7 C)    TempSrc: Oral    SpO2: 98%    Weight: 128 lb (58.1 kg)    Height: 5\' 3"  (1.6 m)     Exam General appearance : Awake, alert, not in any distress. Speech pressured.  Anxious affect. Not toxic looking HEENT: Atraumatic and Normocephalic.  I looked with magnification and had Travia Benjamin< RN look as weel.  No nits or lice visualized in her scalp.  There is no obvious rash or dandruff in the scalp witherNeck: Supple, no JVD. No cervical lymphadenopathy.  Chest: Good air entry bilaterally, CTAB.  No rales/rhonchi/wheezing CVS: S1 S2 regular, no murmurs.   Extremities: B/L Lower Ext shows no edema, both legs are warm to touch Neurology: Awake alert, and oriented X 3, CN II-XII intact, Non focal Skin: No Rash, there is healed scabbing all over her skin.  No burrows in fingers.  No secondary infections noted  Data Review Lab Results  Component Value Date   HGBA1C 5.5 03/20/2021   HGBA1C 5.8 (H) 06/22/2018   HGBA1C 5.7 (H) 02/11/2018    Assessment & Plan   1. Essential hypertension Patient agrees to take meds as soon as she leaves.  Check BP OOO 3-4 times weekly and record.  Her BPs are always very high!!! - cloNIDine (CATAPRES) tablet 0.1 mg - valsartan-hydrochlorothiazide (DIOVAN-HCT) 160-25 MG tablet; Take 1 tablet by mouth daily.  Dispense: 40 tablet; Refill: 3  2. Pruritic dermatitis I am wondering if her skin has become hypersensitive due to itching and scratching and now it just won't calm down due to being chronically irritated.  Her itching may be due to changes in methadone dosing or even possible other recreational substances.  She was treated again for scabies only 1 week ago and there is no body lice visible today - hydrOXYzine (VISTARIL) 25 MG capsule; Take 1 capsule (25 mg total) by mouth every 8 (eight) hours as needed. For itching  Dispense: 90 capsule; Refill: 1 - triamcinolone cream (KENALOG) 0.1 %; Apply cream to affected area(s) 2 times daily.  Dispense: 45 g; Refill: 0 - Ambulatory referral to Dermatology    Patient have been counseled extensively about nutrition and exercise. Other issues discussed during this visit include: low cholesterol diet, weight control and daily exercise, foot care, annual eye examinations at Ophthalmology, importance of adherence with medications and regular follow-up. We also discussed long term complications of uncontrolled diabetes and hypertension.   Return in about 2 weeks (around 10/22/2021) for 2-3 weeks with Lurena Joiner for BP and 3 months with PCP.  The patient was given clear instructions  to go to ER or return to medical center if symptoms don't improve, worsen or new problems develop. The patient verbalized understanding. The patient was told to call to get lab results if they haven't heard anything in the next week.      Freeman Caldron, PA-C [facility] [place], [place]   10/08/2021, 12:39 PM

## 2021-10-22 ENCOUNTER — Telehealth: Payer: Self-pay | Admitting: Critical Care Medicine

## 2021-10-22 NOTE — Telephone Encounter (Signed)
I return Pt call, she was inform that is a most to have the IRS paper if she did not file taxes

## 2021-10-22 NOTE — Telephone Encounter (Signed)
Copied from CRM [id number]. Topic: General - Other >> Oct 22, 2021 11:23 AM McGill, Darlina Rumpf wrote: Reason for CRM: Pt stated she has questions/needs assistance filling out application for orange card.

## 2021-10-23 ENCOUNTER — Ambulatory Visit: Payer: Medicaid Other

## 2021-10-23 ENCOUNTER — Ambulatory Visit: Payer: Medicaid Other | Admitting: Pharmacist

## 2021-10-23 ENCOUNTER — Inpatient Hospital Stay: Admission: RE | Admit: 2021-10-23 | Payer: Medicaid Other | Source: Ambulatory Visit

## 2021-11-28 ENCOUNTER — Telehealth: Payer: Self-pay | Admitting: Critical Care Medicine

## 2021-11-28 NOTE — Telephone Encounter (Signed)
Copied from CRM [id number]. Topic: General - Other ?>> Nov 28, 2021  9:34 AM Gaetana Michaelis A wrote: ?Reason for CRM: The patient has called to speak with Mr Ariana Jensen when possible  ? ?The patient would like to know if Mr Ariana Jensen has received the tax information that was recently submitted by the tax office for them  ? ?Please contact further when possible ?

## 2021-12-03 NOTE — Telephone Encounter (Signed)
I return Pt call, no answer and no VM ?

## 2021-12-11 ENCOUNTER — Telehealth: Payer: Self-pay | Admitting: Critical Care Medicine

## 2021-12-11 NOTE — Telephone Encounter (Signed)
I return Pt call, No answer, I have no Idea what is she is talking about it, also try to inform the Pt that today is the last day I be working @ [facility] financial department, also that will be no one here for now to do financial or answer any question for now ?

## 2021-12-11 NOTE — Telephone Encounter (Signed)
Copied from CRM [id number]. Topic: General - Inquiry ?>> Dec 09, 2021  3:05 PM Aretta Nip wrote: ?Pt got a letter back from IRS and she is confused as they were talking about her filing fee and she stated in previous converstaion with them that they were to be contacting Mikle Bosworth. Pt is confused and wants to know if she needs to do anything else in the card process, FU at [phone] a message may be left. ?

## 2022-01-20 ENCOUNTER — Ambulatory Visit: Payer: Self-pay | Attending: Critical Care Medicine | Admitting: Critical Care Medicine

## 2022-01-20 ENCOUNTER — Encounter: Payer: Self-pay | Admitting: Critical Care Medicine

## 2022-01-20 ENCOUNTER — Other Ambulatory Visit: Payer: Self-pay

## 2022-01-20 VITALS — BP 194/121 | HR 67 | Wt 133.6 lb

## 2022-01-20 DIAGNOSIS — F1410 Cocaine abuse, uncomplicated: Secondary | ICD-10-CM | POA: Insufficient documentation

## 2022-01-20 DIAGNOSIS — L299 Pruritus, unspecified: Secondary | ICD-10-CM | POA: Insufficient documentation

## 2022-01-20 DIAGNOSIS — F17210 Nicotine dependence, cigarettes, uncomplicated: Secondary | ICD-10-CM | POA: Insufficient documentation

## 2022-01-20 DIAGNOSIS — Z79899 Other long term (current) drug therapy: Secondary | ICD-10-CM | POA: Insufficient documentation

## 2022-01-20 DIAGNOSIS — Z720 Tobacco use: Secondary | ICD-10-CM

## 2022-01-20 DIAGNOSIS — M25552 Pain in left hip: Secondary | ICD-10-CM | POA: Insufficient documentation

## 2022-01-20 DIAGNOSIS — F314 Bipolar disorder, current episode depressed, severe, without psychotic features: Secondary | ICD-10-CM

## 2022-01-20 DIAGNOSIS — I160 Hypertensive urgency: Secondary | ICD-10-CM

## 2022-01-20 MED ORDER — GABAPENTIN 300 MG PO CAPS
300.0000 mg | ORAL_CAPSULE | Freq: Four times a day (QID) | ORAL | 0 refills | Status: DC | PRN
  Filled 2022-01-20: qty 90, 23d supply, fill #0

## 2022-01-20 MED ORDER — MIRTAZAPINE 15 MG PO TABS
15.0000 mg | ORAL_TABLET | Freq: Every day | ORAL | 3 refills | Status: DC
  Filled 2022-01-20: qty 30, 30d supply, fill #0

## 2022-01-20 MED ORDER — CAPSAICIN 0.035 % EX CREA
TOPICAL_CREAM | CUTANEOUS | 3 refills | Status: DC
  Filled 2022-01-20: qty 42.5, fill #0

## 2022-01-20 MED ORDER — NICOTINE 21 MG/24HR TD PT24
21.0000 mg | MEDICATED_PATCH | Freq: Every day | TRANSDERMAL | 0 refills | Status: DC
  Filled 2022-01-20: qty 28, 28d supply, fill #0

## 2022-01-20 MED ORDER — NICOTINE POLACRILEX 4 MG MT LOZG
LOZENGE | OROMUCOSAL | 4 refills | Status: AC
Start: 2022-01-20 — End: ?

## 2022-01-20 MED ORDER — AMLODIPINE BESYLATE 10 MG PO TABS
10.0000 mg | ORAL_TABLET | Freq: Every day | ORAL | 2 refills | Status: DC
  Filled 2022-01-20: qty 30, 30d supply, fill #0

## 2022-01-20 MED ORDER — CLONIDINE HCL 0.1 MG PO TABS
0.1000 mg | ORAL_TABLET | Freq: Two times a day (BID) | ORAL | 2 refills | Status: DC
Start: 2022-01-20 — End: 2022-04-02
  Filled 2022-01-20: qty 60, 30d supply, fill #0

## 2022-01-20 MED ORDER — VALSARTAN-HYDROCHLOROTHIAZIDE 320-25 MG PO TABS
1.0000 | ORAL_TABLET | Freq: Every day | ORAL | 3 refills | Status: DC
  Filled 2022-01-20: qty 30, 30d supply, fill #0

## 2022-01-20 NOTE — Assessment & Plan Note (Signed)
Left hip pain persist we will image the hip and once the patient gets financial assistance will refer to orthopedic ?

## 2022-01-20 NOTE — Patient Instructions (Addendum)
Obtain x-ray of the left hip today ? ?Stop Sinequan and begin Remeron 15 mg at bedtime for itching ? ?Discontinue triamcinolone cream and discontinue hydroxyzine ? ?Begin clonidine 1 tablet twice daily for blood pressure, increase valsartan HCT to 320/25 1 daily, begin amlodipine 10 mg daily. ? ?Reschedule your mammogram ? ?Process colon cancer screening kit and bring it back to the clinic ? ?Process your paperwork for financial assistance ? ?Stop smoking using nicotine patch and use the lozenge as well 3 times daily see attachment ? ?Blood test today to assess for allergies ? ?Return to see [facility] clinical pharmacy in 3 weeks for blood pressure control ? ?Return to Dr. Joya Gaskins 2 months ? ?You need mental health care Go to [facility] during open access times as noted below for assessment for medication management for your bipolar condition ? ?[facility] ?[address] [place], [place] [address] [phone] or [phone] ?Walk-in urgent care 24/7 for anyone ? ?For [facility] Residents ONLY ?New patient assessments and therapy walk-ins: ?Monday and Wednesday 8am-11am ?First and second Friday 1pm-5pm ?New patient psychiatry and medication management walk-ins: ?Mondays, Wednesdays, Thursdays, Fridays 8am-11am ?No psychiatry walk-ins on Tuesday   ? ?

## 2022-01-20 NOTE — Assessment & Plan Note (Signed)
Patient claims to not be using at this time ?

## 2022-01-20 NOTE — Assessment & Plan Note (Addendum)
? ? ??   Current smoking consumption amount: 1 pack a day ? ?? Dicsussion on advise to quit smoking and smoking impacts: Cardiovascular impacts ? ?? Patient's willingness to quit: Uncertain ? ?? Methods to quit smoking discussed: Behavioral modification nicotine replacement ? ?? Medication management of smoking session drugs discussed: Nicotine replacement ? ?? Resources provided:  AVS  ? ?? Setting quit date not established ? ?? Follow-up arranged 3 week ? ? ?Time spent counseling the patient: 5 minutes ? ?

## 2022-01-20 NOTE — Assessment & Plan Note (Signed)
Blood pressures consistently remain elevated she continues to smoke heavily ? ?Plan to begin clonidine  0.1 mg twice daily, amlodipine 10 mg daily, increase valsartan HCT to 320/25 daily, have this patient see clinical pharmacy in 2 weeks ? ?Focus on smoking cessation at this time using nicotine replacement using patch and lozenge ? ? ?

## 2022-01-20 NOTE — Assessment & Plan Note (Signed)
This patient would require significant mental health support and I have given her resources to go to [facility] for walk-in care for medication management and recommended she follow through on this ?

## 2022-01-20 NOTE — Assessment & Plan Note (Signed)
Patient failed hydroxyzine triamcinolone and Sinequan ? ?Plan to begin Remeron 15 mg at bedtime and capsaicin cream twice daily to skin ? ?We will check IgE and absolute eosinophil levels to rule out allergic manifestations ?

## 2022-01-20 NOTE — Progress Notes (Signed)
? ?Established Patient Office Visit ? ?Subjective:  ?Patient ID: Ariana Jensen, female    DOB: 1965/10/25  Age: 56 y.o. MRN: [id number] ? ?CC:  ?Chief Complaint  ?Patient presents with  ? Hypertension  ? Medication Refill  ? Hip Pain  ? ? ?HPI ? ?5/2 ?This patient returns in follow-up and on arrival blood pressure elevated 194/120.  This is a chronic elevation of blood pressure the patient continues to experience.  She has been seen by 3 other providers since I last saw her in October.  The main interventions of an occasional clonidine tablets given and then she was placed on valsartan HCT 160/25 at the last visit in March.  She has been taking this medication.  She measures her blood pressure at home she gets around 180/116-120.  She is still smoking 1 pack a day of cigarettes.  She has no real symptoms from this.  She complains of severe pruritus that is all over her body for several months.  She states gabapentin being used for chronic pain does nothing for the itching.  She was been given hydroxyzine with no relief.  Given triamcinolone cream with no relief.  She was thought to have scabies and did not respond to permethrin.  She denies seeing any insects in her home.  She has had her home cleaned.  The skin feels like a burning sensation is occurring.  She also now has left hip pain is been going on for several months.  The pruritus is worse at night.  We tried Sinequan for this it did not help her symptoms.  She has been on methadone for 25 years maintains a 95 mg daily dosing from [facility]. ? ?Patient has a mammogram scheduled in February as she could not get transportation missed this appointment.  She is yet to process her colon cancer screening kit.  She is trying to get her final paperwork arranged for financial assistance.  Were unable to give the tetanus shot because she is uninsured and does not have her orange card as of yet.  She had does have bipolar and is on Depakote.  She has not seen a mental  health provider for many years. ?BP scalp derm mammo: scholarship, fobt, pap. Tdap never saw luke for bp fin ? ?Past Medical History:  ?Diagnosis Date  ? Anxiety   ? ASCUS (atypical squamous cells of undetermined significance) on Pap smear   ? Body lice 80/16/5537  ? Chronic lower back pain   ? Cocaine abuse ([place])   ? crack cocaine  ? Depression   ? Fibromyalgia   ? GERD (gastroesophageal reflux disease)   ? Hepatitis C   ? Hepatitis C antibody test positive   ? Hypertension   ? Hypokalemia 03/04/2018  ? Leukocytosis 03/04/2018  ? Opioid dependence ([place])   ? PVC's (premature ventricular contractions) 03/04/2018  ? Scabies 05/13/2021  ? Seizures ([place])   ? "don't know why; I've had 2 in the past year" (08/18/2016)  ? Severe recurrent major depression without psychotic features ([place]) 06/21/2018  ? Trichomonas 04/17/2011  ? Diagnosed 04/02/11 during hospitalization, treated with Flagyl 2g, patient instructed to have partner treated (must follow-up)   ? Tuberculosis   ? 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 age 67, now s/p 1-yr of multidrug treatment (dates unknown)  ? ? ?Past Surgical History:  ?Procedure Laterality Date  ? [place]  ? DILATION AND CURETTAGE OF UTERUS    ? I & D EXTREMITY  10/18/2011  ?  Procedure: IRRIGATION AND DEBRIDEMENT EXTREMITY;  Surgeon: Linna Hoff, MD;  Location: [place];  Service: Orthopedics;  Laterality: Left;  ? I & D EXTREMITY  10/21/2011  ? Procedure: IRRIGATION AND DEBRIDEMENT EXTREMITY;  Surgeon: Linna Hoff, MD;  Location: [place];  Service: Orthopedics;  Laterality: Left;  ? SALPINGECTOMY Left March 2010  ? ectopic pregnancy  ? ? ?Family History  ?Problem Relation Age of Onset  ? Heart attack Mother   ? Heart attack Father   ? Heart attack Paternal Grandmother   ? Heart attack Paternal Grandfather   ? Cirrhosis Brother   ? ? ?Social History  ? ?Socioeconomic History  ? Marital status: Widowed  ?  Spouse name: Not on file  ? Number of children: 1  ? Years of education: 7th  grade  ? Highest education level: Not on file  ?Occupational History  ? Not on file  ?Tobacco Use  ? Smoking status: Every Day  ?  Packs/day: 0.50  ?  Years: 32.00  ?  Pack years: 16.00  ?  Types: Cigarettes  ? Smokeless tobacco: Never  ?Vaping Use  ? Vaping Use: Never used  ?Substance and Sexual Activity  ? Alcohol use: Yes  ?  Alcohol/week: 0.0 standard drinks  ?  Comment: occ  ? Drug use: Yes  ?  Types: Oxycodone, Heroin, Cocaine, IV  ?  Comment: Pt stated "I do heroin"  ? Sexual activity: Not Currently  ?  Birth control/protection: None  ?Other Topics Concern  ? Not on file  ?Social History Narrative  ? Lives in [place]  ? Currently Unemployeed  ? ?Social Determinants of Health  ? ?Financial Resource Strain: Not on file  ?Food Insecurity: Not on file  ?Transportation Needs: Not on file  ?Physical Activity: Not on file  ?Stress: Not on file  ?Social Connections: Not on file  ?Intimate Partner Violence: Not on file  ? ? ?Outpatient Medications Prior to Visit  ?Medication Sig Dispense Refill  ? divalproex (DEPAKOTE ER) 500 MG 24 hr tablet Take 1 tablet (500 mg total) by mouth every evening. 30 tablet 6  ? methadone (DOLOPHINE) 10 MG/ML solution Take 95 mg by mouth daily.    ? doxepin (SINEQUAN) 50 MG capsule Take 1 capsule (50 mg total) by mouth at bedtime. 30 capsule 2  ? gabapentin (NEURONTIN) 300 MG capsule Take 1 capsule (300 mg total) by mouth 4 (four) times daily as needed. 90 capsule 0  ? hydrOXYzine (VISTARIL) 25 MG capsule Take 1 capsule (25 mg total) by mouth every 8 (eight) hours as needed. For itching 90 capsule 1  ? triamcinolone cream (KENALOG) 0.1 % Apply cream to affected area(s) 2 times daily. 45 g 0  ? valsartan-hydrochlorothiazide (DIOVAN-HCT) 160-25 MG tablet Take 1 tablet by mouth daily. 40 tablet 3  ? cloNIDine (CATAPRES) tablet 0.1 mg     ? ?No facility-administered medications prior to visit.  ? ? ?No Known Allergies ? ?ROS ?Review of Systems  ?Constitutional: Negative.   ?HENT: Negative.   Negative for ear pain, postnasal drip, rhinorrhea, sinus pressure, sore throat, trouble swallowing and voice change.   ?Eyes: Negative.   ?Respiratory:  Positive for shortness of breath. Negative for apnea, cough, choking, chest tightness, wheezing and stridor.   ?Cardiovascular: Negative.  Negative for chest pain, palpitations and leg swelling.  ?Gastrointestinal: Negative.  Negative for abdominal distention, abdominal pain, nausea and vomiting.  ?Genitourinary: Negative.   ?Musculoskeletal: Negative.  Negative for arthralgias and myalgias.  ?  Left hip pain   ?Skin:  Negative for rash.  ?     Severe pruritus  ?Allergic/Immunologic: Negative.  Negative for environmental allergies and food allergies.  ?Neurological: Negative.  Negative for dizziness, syncope, weakness and headaches.  ?Hematological: Negative.  Negative for adenopathy. Does not bruise/bleed easily.  ?Psychiatric/Behavioral:  Positive for decreased concentration. Negative for agitation and sleep disturbance. The patient is nervous/anxious and is hyperactive.   ? ?  ?Objective:  ?  ?Physical Exam ?Vitals reviewed.  ?Constitutional:   ?   Appearance: Normal appearance. She is well-developed. She is not diaphoretic.  ?HENT:  ?   Head: Normocephalic and atraumatic.  ?   Nose: No nasal deformity, septal deviation, mucosal edema or rhinorrhea.  ?   Right Sinus: No maxillary sinus tenderness or frontal sinus tenderness.  ?   Left Sinus: No maxillary sinus tenderness or frontal sinus tenderness.  ?   Mouth/Throat:  ?   Mouth: Mucous membranes are moist.  ?   Pharynx: Oropharynx is clear. No oropharyngeal exudate.  ?Eyes:  ?   General: No scleral icterus. ?   Conjunctiva/sclera: Conjunctivae normal.  ?   Pupils: Pupils are equal, round, and reactive to light.  ?Neck:  ?   Thyroid: No thyromegaly.  ?   Vascular: No carotid bruit or JVD.  ?   Trachea: Trachea normal. No tracheal tenderness or tracheal deviation.  ?Cardiovascular:  ?   Rate and Rhythm: Normal  rate and regular rhythm.  ?   Chest Wall: PMI is not displaced.  ?   Pulses: Normal pulses. No decreased pulses.  ?   Heart sounds: Normal heart sounds, S1 normal and S2 normal. Heart sounds not distant. N

## 2022-01-23 LAB — IGE: IgE (Immunoglobulin E), Serum: 53 IU/mL (ref 6–495)

## 2022-01-23 LAB — EOSINOPHIL COUNT: EOS (ABSOLUTE): 0.1 10*3/uL (ref 0.0–0.4)

## 2022-01-27 ENCOUNTER — Other Ambulatory Visit: Payer: Self-pay

## 2022-03-10 ENCOUNTER — Other Ambulatory Visit: Payer: Self-pay

## 2022-03-10 ENCOUNTER — Ambulatory Visit: Payer: Self-pay | Admitting: Physician Assistant

## 2022-03-10 ENCOUNTER — Encounter: Payer: Self-pay | Admitting: Physician Assistant

## 2022-03-10 DIAGNOSIS — B86 Scabies: Secondary | ICD-10-CM

## 2022-03-10 MED ORDER — HYDROXYZINE HCL 10 MG PO TABS
10.0000 mg | ORAL_TABLET | Freq: Three times a day (TID) | ORAL | 0 refills | Status: DC | PRN
  Filled 2022-03-10: qty 30, 10d supply, fill #0

## 2022-03-10 MED ORDER — PERMETHRIN 5 % EX CREA
1.0000 | TOPICAL_CREAM | Freq: Once | CUTANEOUS | 1 refills | Status: DC
  Filled 2022-03-10 – 2022-06-03 (×2): qty 60, 30d supply, fill #0
  Filled 2022-06-29: qty 60, 30d supply, fill #1

## 2022-03-10 NOTE — Progress Notes (Signed)
Established Patient Office Visit  Subjective   Patient ID: Ariana Jensen, female    DOB: Jul 30, 1966  Age: 56 y.o. MRN: [id number]  Chief Complaint  Patient presents with   scabies  Virtual Visit via Telephone Note  I connected with Ariana Jensen on 03/10/22 at  2:20 PM EDT by telephone and verified that I am speaking with the correct person using two identifiers.  Location: Patient: Home  Provider: [facility] Medicine [facility]    I discussed the limitations, risks, security and privacy concerns of performing an evaluation and management service by telephone and the availability of in person appointments. I also discussed with the patient that there may be a patient responsible charge related to this service. The patient expressed understanding and agreed to proceed.   History of Present Illness: Ariana Jensen states that she believes she is having an outbreak of scabies once again.  Reports that she started having itchy spots over her entire body approximately 3 weeks ago.  States that she has been treated for this many times before and with the last one being in January 2023.  States that she did have an exterminator come out after that episode and did have her rash resolved and her scabies infestation in her home resolved.  States that she believes she had the reinfestation after visiting an ex-boyfriend.     Denies any new fragrances, body washes, detergents, lotions, shampoos, medications.  Does endorse pet in-home    Observations/Objective: Medical history and current medications reviewed, no physical exam completed   Past Medical History:  Diagnosis Date   Anxiety    ASCUS (atypical squamous cells of undetermined significance) on Pap smear    Body lice 08/05/2015   Chronic lower back pain    Cocaine abuse (HCC)    crack cocaine   Depression    Fibromyalgia    GERD (gastroesophageal reflux disease)    Hepatitis C    Hepatitis C antibody test positive     Hypertension    Hypokalemia 03/04/2018   Leukocytosis 03/04/2018   Opioid dependence (HCC)    PVC's (premature ventricular contractions) 03/04/2018   Scabies 05/13/2021   Seizures (HCC)    "don't know why; I've had 2 in the past year" (08/18/2016)   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   Trichomonas 04/17/2011   Diagnosed 04/02/11 during hospitalization, treated with Flagyl 2g, patient instructed to have partner treated (must follow-up)    Tuberculosis    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 age 75, now s/p 1-yr of multidrug treatment (dates unknown)   Social History   Socioeconomic History   Marital status: Widowed    Spouse name: Not on file   Number of children: 1   Years of education: 7th grade   Highest education level: Not on file  Occupational History   Not on file  Tobacco Use   Smoking status: Every Day    Packs/day: 0.50    Years: 32.00    Total pack years: 16.00    Types: Cigarettes   Smokeless tobacco: Never  Vaping Use   Vaping Use: Never used  Substance and Sexual Activity   Alcohol use: Yes    Alcohol/week: 0.0 standard drinks of alcohol    Comment: occ   Drug use: Yes    Types: Oxycodone, Heroin, Cocaine, IV    Comment: Pt stated "I do heroin"   Sexual activity: Not Currently    Birth control/protection: None  Other Topics Concern  Not on file  Social History Narrative   Lives in [place]   Currently Unemployeed   Social Determinants of Health   Financial Resource Strain: Not on file  Food Insecurity: Not on file  Transportation Needs: Not on file  Physical Activity: Not on file  Stress: Not on file  Social Connections: Not on file  Intimate Partner Violence: Not on file   Family History  Problem Relation Age of Onset   Heart attack Mother    Heart attack Father    Heart attack Paternal Grandmother    Heart attack Paternal Grandfather    Cirrhosis Brother    No Known Allergies    Review of  Systems  Constitutional: Negative.   HENT: Negative.    Eyes: Negative.   Respiratory:  Negative for shortness of breath.   Cardiovascular:  Negative for chest pain.  Gastrointestinal: Negative.   Genitourinary: Negative.   Musculoskeletal: Negative.   Skin:  Positive for itching and rash.  Neurological: Negative.   Endo/Heme/Allergies: Negative.   Psychiatric/Behavioral: Negative.           Assessment & Plan:   Problem List Items Addressed This Visit   None Visit Diagnoses     Scabies    -  Primary   Relevant Medications   permethrin (ELIMITE) 5 % cream   hydrOXYzine (ATARAX) 10 MG tablet       Assessment and Plan: 1. Scabies Trial permethrin, hydroxyzine.  Patient education given on supportive care.  Red flags given for prompt reevaluation.  You  No AVS created, patient states that she is unable to access her MyChart account. - permethrin (ELIMITE) 5 % cream; Apply cream onto affected area once as directed  Dispense: 60 g; Refill: 1 - hydrOXYzine (ATARAX) 10 MG tablet; Take 1 tablet (10 mg total) by mouth 3 (three) times daily as needed.  Dispense: 30 tablet; Refill: 0   Follow Up Instructions:    I discussed the assessment and treatment plan with the patient. The patient was provided an opportunity to ask questions and all were answered. The patient agreed with the plan and demonstrated an understanding of the instructions.   The patient was advised to call back or seek an in-person evaluation if the symptoms worsen or if the condition fails to improve as anticipated.  I provided 12 minutes of non-face-to-face time during this encounter.   Return if symptoms worsen or fail to improve.    [name], PA-C

## 2022-03-11 ENCOUNTER — Other Ambulatory Visit: Payer: Self-pay

## 2022-03-16 ENCOUNTER — Other Ambulatory Visit: Payer: Self-pay

## 2022-04-02 ENCOUNTER — Encounter: Payer: Self-pay | Admitting: Critical Care Medicine

## 2022-04-02 ENCOUNTER — Other Ambulatory Visit: Payer: Self-pay

## 2022-04-02 ENCOUNTER — Ambulatory Visit: Payer: Medicaid Other | Attending: Critical Care Medicine | Admitting: Critical Care Medicine

## 2022-04-02 VITALS — BP 199/119 | HR 63 | Temp 98.2°F | Ht 63.0 in | Wt 126.2 lb

## 2022-04-02 DIAGNOSIS — L299 Pruritus, unspecified: Secondary | ICD-10-CM

## 2022-04-02 DIAGNOSIS — K219 Gastro-esophageal reflux disease without esophagitis: Secondary | ICD-10-CM

## 2022-04-02 DIAGNOSIS — F1910 Other psychoactive substance abuse, uncomplicated: Secondary | ICD-10-CM

## 2022-04-02 DIAGNOSIS — B86 Scabies: Secondary | ICD-10-CM

## 2022-04-02 DIAGNOSIS — I160 Hypertensive urgency: Secondary | ICD-10-CM

## 2022-04-02 DIAGNOSIS — F17210 Nicotine dependence, cigarettes, uncomplicated: Secondary | ICD-10-CM

## 2022-04-02 DIAGNOSIS — F314 Bipolar disorder, current episode depressed, severe, without psychotic features: Secondary | ICD-10-CM

## 2022-04-02 DIAGNOSIS — Z1211 Encounter for screening for malignant neoplasm of colon: Secondary | ICD-10-CM

## 2022-04-02 DIAGNOSIS — F332 Major depressive disorder, recurrent severe without psychotic features: Secondary | ICD-10-CM

## 2022-04-02 DIAGNOSIS — Z720 Tobacco use: Secondary | ICD-10-CM

## 2022-04-02 DIAGNOSIS — F1410 Cocaine abuse, uncomplicated: Secondary | ICD-10-CM

## 2022-04-02 MED ORDER — HYDROXYZINE HCL 10 MG PO TABS
10.0000 mg | ORAL_TABLET | Freq: Three times a day (TID) | ORAL | 0 refills | Status: DC | PRN
  Filled 2022-04-02: qty 30, 10d supply, fill #0

## 2022-04-02 MED ORDER — SPIRONOLACTONE 25 MG PO TABS
25.0000 mg | ORAL_TABLET | Freq: Every day | ORAL | 2 refills | Status: DC
  Filled 2022-04-02: qty 30, 30d supply, fill #0

## 2022-04-02 MED ORDER — CLONIDINE HCL 0.2 MG PO TABS
0.2000 mg | ORAL_TABLET | Freq: Two times a day (BID) | ORAL | 4 refills | Status: DC
  Filled 2022-04-02: qty 60, 30d supply, fill #0

## 2022-04-02 MED ORDER — MIRTAZAPINE 15 MG PO TABS
15.0000 mg | ORAL_TABLET | Freq: Every day | ORAL | 3 refills | Status: DC
  Filled 2022-04-02: qty 30, 30d supply, fill #0

## 2022-04-02 MED ORDER — GABAPENTIN 300 MG PO CAPS
300.0000 mg | ORAL_CAPSULE | Freq: Four times a day (QID) | ORAL | 1 refills | Status: DC | PRN
  Filled 2022-04-02: qty 120, 30d supply, fill #0
  Filled 2022-06-03: qty 120, 30d supply, fill #1

## 2022-04-02 MED ORDER — DIVALPROEX SODIUM ER 500 MG PO TB24
500.0000 mg | ORAL_TABLET | Freq: Every evening | ORAL | 6 refills | Status: DC
  Filled 2022-04-02: qty 30, 30d supply, fill #0

## 2022-04-02 MED ORDER — NALOXONE HCL 4 MG/0.1ML NA LIQD
NASAL | 1 refills | Status: AC
  Filled 2022-04-02: qty 2, 30d supply, fill #0

## 2022-04-02 MED ORDER — VALSARTAN-HYDROCHLOROTHIAZIDE 320-25 MG PO TABS
1.0000 | ORAL_TABLET | Freq: Every day | ORAL | 3 refills | Status: DC
  Filled 2022-04-02: qty 30, 30d supply, fill #0

## 2022-04-02 NOTE — Assessment & Plan Note (Signed)
Plan to refill omeprazole

## 2022-04-02 NOTE — Assessment & Plan Note (Signed)
Blood pressure again remains high plan for this patient is to increase clonidine to 0.2 mg twice daily and add Aldactone 25 mg daily patient will also continue with the amlodipine 10 mg daily and valsartan HCT 320/25 daily  Metabolic panel will be obtained at this visit to assess renal function  The following Lifestyle Medicine recommendations according to [employer] College of Lifestyle Medicine [facility]) were discussed and offered to patient who agrees to start the journey:  A. Whole Foods, Plant-based plate comprising of fruits and vegetables, plant-based proteins, whole-grain carbohydrates was discussed in detail with the patient.   A list for source of those nutrients were also provided to the patient.  Patient will use only water or unsweetened tea for hydration. B.  The need to stay away from risky substances including alcohol, smoking; obtaining 7 to 9 hours of restorative sleep, at least 150 minutes of moderate intensity exercise weekly, the importance of healthy social connections,  and stress reduction techniques were discussed.

## 2022-04-02 NOTE — Assessment & Plan Note (Signed)
Patient claims not currently using

## 2022-04-02 NOTE — Assessment & Plan Note (Signed)
This has resolved with capsaicin and Remeron

## 2022-04-02 NOTE — Assessment & Plan Note (Signed)
Patient reports relapse on heroin and is still on the methadone

## 2022-04-02 NOTE — Progress Notes (Signed)
Established Patient Office Visit  Subjective:  Patient ID: Ariana Jensen, female    DOB: 05/28/1966  Age: 56 y.o. MRN: [id number]  CC:  Chief Complaint  Patient presents with   Hypertension    HPI  5/2 This patient returns in follow-up and on arrival blood pressure elevated 194/120.  This is a chronic elevation of blood pressure the patient continues to experience.  She has been seen by 3 other providers since I last saw her in October.  The main interventions of an occasional clonidine tablets given and then she was placed on valsartan HCT 160/25 at the last visit in March.  She has been taking this medication.  She measures her blood pressure at home she gets around 180/116-120.  She is still smoking 1 pack a day of cigarettes.  She has no real symptoms from this.  She complains of severe pruritus that is all over her body for several months.  She states gabapentin being used for chronic pain does nothing for the itching.  She was been given hydroxyzine with no relief.  Given triamcinolone cream with no relief.  She was thought to have scabies and did not respond to permethrin.  She denies seeing any insects in her home.  She has had her home cleaned.  The skin feels like a burning sensation is occurring.  She also now has left hip pain is been going on for several months.  The pruritus is worse at night.  We tried Sinequan for this it did not help her symptoms.  She has been on methadone for 25 years maintains a 95 mg daily dosing from [facility].  Patient has a mammogram scheduled in February as she could not get transportation missed this appointment.  She is yet to process her colon cancer screening kit.  She is trying to get her final paperwork arranged for financial assistance.  Were unable to give the tetanus shot because she is uninsured and does not have her orange card as of yet.  She had does have bipolar and is on Depakote.  She has not seen a mental health provider for many years. BP  scalp derm mammo: scholarship, fobt, pap. Tdap never saw luke for bp fin  7/13 This patient is seen in return follow-up on arrival blood pressure 199/119.  Patient's not been compliant with her medication missing her afternoon dose of clonidine.  Patient also snorted heroin 2 months ago and is on a methadone program.  She is now back in outpatient therapy intensive along with the methadone.  She does not have a Narcan available.  She smokes about a half a pack a day of cigarettes at this time.  She does not eat a healthy diet.  She has a blood pressure meter at home and is running high at home as well.  She never got her mammogram that was ordered previously.  She also needs colon cancer screening process she has lost the kit we gave her previously The patient follows with mental health and remains on the Remeron, Atarax, Depakote and gabapentin. Past Medical History:  Diagnosis Date   Anxiety    ASCUS (atypical squamous cells of undetermined significance) on Pap smear    Body lice 08/05/2015   Chronic lower back pain    Cocaine abuse (HCC)    crack cocaine   Depression    Fibromyalgia    GERD (gastroesophageal reflux disease)    Hepatitis C    Hepatitis C antibody test positive  Hypertension    Hypokalemia 03/04/2018   Leukocytosis 03/04/2018   Opioid dependence ([place])    PVC's (premature ventricular contractions) 03/04/2018   Scabies 05/13/2021   Seizures ([place])    "don't know why; I've had 2 in the past year" (08/18/2016)   Severe recurrent major depression without psychotic features ([place]) 06/21/2018   Trichomonas 04/17/2011   Diagnosed 04/02/11 during hospitalization, treated with Flagyl 2g, patient instructed to have partner treated (must follow-up)    Tuberculosis    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 age 17, now s/p 1-yr of multidrug treatment (dates unknown)    Past Surgical History:  Procedure Laterality Date   [place] D EXTREMITY  10/18/2011   Procedure: IRRIGATION AND DEBRIDEMENT EXTREMITY;  Surgeon: Linna Hoff, MD;  Location: [place];  Service: Orthopedics;  Laterality: Left;   I & D EXTREMITY  10/21/2011   Procedure: IRRIGATION AND DEBRIDEMENT EXTREMITY;  Surgeon: Linna Hoff, MD;  Location: [place];  Service: Orthopedics;  Laterality: Left;   SALPINGECTOMY Left March 2010   ectopic pregnancy    Family History  Problem Relation Age of Onset   Heart attack Mother    Heart attack Father    Heart attack Paternal Grandmother    Heart attack Paternal Grandfather    Cirrhosis Brother     Social History   Socioeconomic History   Marital status: Widowed    Spouse name: Not on file   Number of children: 1   Years of education: 7th grade   Highest education level: Not on file  Occupational History   Not on file  Tobacco Use   Smoking status: Every Day    Packs/day: 0.50    Years: 32.00    Total pack years: 16.00    Types: Cigarettes   Smokeless tobacco: Never  Vaping Use   Vaping Use: Never used  Substance and Sexual Activity   Alcohol use: Yes    Alcohol/week: 0.0 standard drinks of alcohol    Comment: occ   Drug use: Yes    Types: Oxycodone, Heroin, Cocaine, IV    Comment: Pt stated "I do heroin"   Sexual activity: Not Currently    Birth control/protection: None  Other Topics Concern   Not on file  Social History Narrative   Lives in [place]   Currently Unemployeed   Social Determinants of Health   Financial Resource Strain: Not on file  Food Insecurity: Not on file  Transportation Needs: Not on file  Physical Activity: Not on file  Stress: Not on file  Social Connections: Not on file  Intimate Partner Violence: Not on file    Outpatient Medications Prior to Visit  Medication Sig Dispense Refill   amLODipine (NORVASC) 10 MG tablet Take 1 tablet (10 mg total) by mouth daily. 60 tablet 2   Capsaicin 0.035 % CREA Apply to affected areas on skin twice daily 42.5 g 3    methadone (DOLOPHINE) 10 MG/ML solution Take 95 mg by mouth daily.     nicotine polacrilex (NICORETTE MINI) 4 MG lozenge Use three times a day for smoking cessation 100 tablet 4   cloNIDine (CATAPRES) 0.1 MG tablet Take 1 tablet (0.1 mg total) by mouth 2 (two) times daily. 60 tablet 2   divalproex (DEPAKOTE ER) 500 MG 24 hr tablet Take 1 tablet (500 mg total) by mouth every evening. 30 tablet 6   gabapentin (NEURONTIN)  300 MG capsule Take 1 capsule (300 mg total) by mouth 4 (four) times daily as needed. 90 capsule 0   hydrOXYzine (ATARAX) 10 MG tablet Take 1 tablet (10 mg total) by mouth 3 (three) times daily as needed. 30 tablet 0   mirtazapine (REMERON) 15 MG tablet Take 1 tablet (15 mg total) by mouth at bedtime. 30 tablet 3   nicotine (NICODERM CQ - DOSED IN MG/24 HOURS) 21 mg/24hr patch Place 1 patch (21 mg total) onto the skin daily. 28 patch 0   valsartan-hydrochlorothiazide (DIOVAN-HCT) 320-25 MG tablet Take 1 tablet by mouth daily. 90 tablet 3   No facility-administered medications prior to visit.    No Known Allergies  ROS Review of Systems  Constitutional: Negative.   HENT: Negative.  Negative for ear pain, postnasal drip, rhinorrhea, sinus pressure, sore throat, trouble swallowing and voice change.   Eyes: Negative.   Respiratory:  Negative for apnea, cough, choking, chest tightness, shortness of breath, wheezing and stridor.   Cardiovascular: Negative.  Negative for chest pain, palpitations and leg swelling.  Gastrointestinal: Negative.  Negative for abdominal distention, abdominal pain, nausea and vomiting.  Genitourinary: Negative.   Musculoskeletal: Negative.  Negative for arthralgias and myalgias.       Left hip pain   Skin:  Negative for rash.       Severe pruritus  Allergic/Immunologic: Negative.  Negative for environmental allergies and food allergies.  Neurological: Negative.  Negative for dizziness, syncope, weakness and headaches.  Hematological: Negative.   Negative for adenopathy. Does not bruise/bleed easily.  Psychiatric/Behavioral:  Positive for decreased concentration. Negative for agitation and sleep disturbance. The patient is nervous/anxious and is hyperactive.       Objective:    Physical Exam Vitals reviewed.  Constitutional:      Appearance: Normal appearance. She is well-developed. She is not diaphoretic.  HENT:     Head: Normocephalic and atraumatic.     Nose: No nasal deformity, septal deviation, mucosal edema or rhinorrhea.     Right Sinus: No maxillary sinus tenderness or frontal sinus tenderness.     Left Sinus: No maxillary sinus tenderness or frontal sinus tenderness.     Mouth/Throat:     Mouth: Mucous membranes are moist.     Pharynx: Oropharynx is clear. No oropharyngeal exudate.  Eyes:     General: No scleral icterus.    Conjunctiva/sclera: Conjunctivae normal.     Pupils: Pupils are equal, round, and reactive to light.  Neck:     Thyroid: No thyromegaly.     Vascular: No carotid bruit or JVD.     Trachea: Trachea normal. No tracheal tenderness or tracheal deviation.  Cardiovascular:     Rate and Rhythm: Normal rate and regular rhythm.     Chest Wall: PMI is not displaced.     Pulses: Normal pulses. No decreased pulses.     Heart sounds: Normal heart sounds, S1 normal and S2 normal. Heart sounds not distant. No murmur heard.    No systolic murmur is present.     No diastolic murmur is present.     No friction rub. No gallop. No S3 or S4 sounds.  Pulmonary:     Effort: No tachypnea, accessory muscle usage or respiratory distress.     Breath sounds: No stridor. No decreased breath sounds, wheezing, rhonchi or rales.  Chest:     Chest wall: No tenderness.  Abdominal:     General: Bowel sounds are normal. There is no distension.  Palpations: Abdomen is soft. Abdomen is not rigid.     Tenderness: There is no abdominal tenderness. There is no guarding or rebound.  Musculoskeletal:        General: Normal  range of motion.     Cervical back: Normal range of motion and neck supple. No edema, erythema or rigidity. No muscular tenderness. Normal range of motion.  Lymphadenopathy:     Head:     Right side of head: No submental or submandibular adenopathy.     Left side of head: No submental or submandibular adenopathy.     Cervical: No cervical adenopathy.  Skin:    General: Skin is warm and dry.     Coloration: Skin is not pale.     Findings: No rash.     Nails: There is no clubbing.  Neurological:     Mental Status: She is alert and oriented to person, place, and time.     Sensory: No sensory deficit.  Psychiatric:        Attention and Perception: She is inattentive. She does not perceive auditory or visual hallucinations.        Mood and Affect: Mood is anxious. Affect is labile.        Speech: Speech normal.        Behavior: Behavior is agitated and hyperactive. Behavior is not slowed, aggressive, withdrawn or combative. Behavior is cooperative.        Thought Content: Thought content normal. Thought content is not paranoid or delusional. Thought content does not include homicidal or suicidal ideation. Thought content does not include homicidal or suicidal plan.        Cognition and Memory: Cognition is not impaired. Memory is impaired. She exhibits impaired recent memory. She does not exhibit impaired remote memory.        Judgment: Judgment normal. Judgment is not impulsive.     BP (!) 199/119   Pulse 63   Temp 98.2 F (36.8 C) (Oral)   Ht 5\' 3"  (1.6 m)   Wt 126 lb 3.2 oz (57.2 kg)   LMP 12/06/2015   SpO2 95%   BMI 22.36 kg/m  Wt Readings from Last 3 Encounters:  04/02/22 126 lb 3.2 oz (57.2 kg)  01/20/22 133 lb 9.6 oz (60.6 kg)  10/08/21 128 lb (58.1 kg)     Health Maintenance Due  Topic Date Due   COLON CANCER SCREENING ANNUAL FOBT  Never done   MAMMOGRAM  Never done   Zoster Vaccines- Shingrix (1 of 2) Never done   PAP SMEAR-Modifier  04/09/2018   COVID-19 Vaccine  (3 - Pfizer series) 03/11/2020   TETANUS/TDAP  10/18/2021    There are no preventive care reminders to display for this patient.  Lab Results  Component Value Date   TSH 0.675 05/20/2021   Lab Results  Component Value Date   WBC 7.2 03/20/2021   HGB 12.8 03/20/2021   HCT 38.9 03/20/2021   MCV 94 03/20/2021   PLT 228 03/20/2021   Lab Results  Component Value Date   NA 139 05/20/2021   K 4.7 05/20/2021   CO2 25 05/20/2021   GLUCOSE 100 (H) 05/20/2021   BUN 13 05/20/2021   CREATININE 0.66 05/20/2021   BILITOT 0.3 04/07/2021   ALKPHOS 69 03/20/2021   AST 18 04/07/2021   ALT 12 04/07/2021   ALT 12 04/07/2021   PROT 7.1 04/07/2021   ALBUMIN 4.0 03/20/2021   CALCIUM 9.7 05/20/2021   ANIONGAP 8 11/01/2020  EGFR 105 05/20/2021   Lab Results  Component Value Date   CHOL 193 03/20/2021   Lab Results  Component Value Date   HDL 51 03/20/2021   Lab Results  Component Value Date   LDLCALC 109 (H) 03/20/2021   Lab Results  Component Value Date   TRIG 193 (H) 03/20/2021   Lab Results  Component Value Date   CHOLHDL 3.8 03/20/2021   Lab Results  Component Value Date   HGBA1C 5.5 03/20/2021      Assessment & Plan:   Problem List Items Addressed This Visit       Cardiovascular and Mediastinum   Hypertensive urgency    Blood pressure again remains high plan for this patient is to increase clonidine to 0.2 mg twice daily and add Aldactone 25 mg daily patient will also continue with the amlodipine 10 mg daily and valsartan HCT 662/94 daily  Metabolic panel will be obtained at this visit to assess renal function  The following Lifestyle Medicine recommendations according to [employer]estyle Medicine [facility]) were discussed and offered to patient who agrees to start the journey:  A. Whole Foods, Plant-based plate comprising of fruits and vegetables, plant-based proteins, whole-grain carbohydrates was discussed in detail with the patient.   A list for  source of those nutrients were also provided to the patient.  Patient will use only water or unsweetened tea for hydration. B.  The need to stay away from risky substances including alcohol, smoking; obtaining 7 to 9 hours of restorative sleep, at least 150 minutes of moderate intensity exercise weekly, the importance of healthy social connections,  and stress reduction techniques were discussed.       Relevant Medications   cloNIDine (CATAPRES) 0.2 MG tablet   valsartan-hydrochlorothiazide (DIOVAN-HCT) 320-25 MG tablet   spironolactone (ALDACTONE) 25 MG tablet   Other Relevant Orders   Comprehensive metabolic panel   CBC with Differential/Platelet     Digestive   GERD (gastroesophageal reflux disease)    Plan to refill omeprazole        Other   Polysubstance abuse ([place])    Patient reports relapse on heroin and is still on the methadone      Tobacco abuse       Current smoking consumption amount: 1 pack a day  Dicsussion on advise to quit smoking and smoking impacts: Cardiovascular impacts  Patient's willingness to quit: Uncertain  Methods to quit smoking discussed: Behavioral modification nicotine replacement  Medication management of smoking session drugs discussed: Nicotine replacement  Resources provided:  AVS   Setting quit date not established  Follow-up arranged 6 week   Time spent counseling the patient: 5 minutes       Colon cancer screening - Primary    We will get colon cancer screening and mammogram      Relevant Orders   Fecal occult blood, imunochemical   Pruritic dermatitis    This has resolved with capsaicin and Remeron      Bipolar I disorder, most recent episode depressed, severe without psychotic features ([place])    Continue with mental health      MDD (major depressive disorder), recurrent episode, severe (HCC)    Stable at this time      Relevant Medications   hydrOXYzine (ATARAX) 10 MG tablet   mirtazapine (REMERON) 15 MG tablet    RESOLVED: Cocaine abuse ([place])    Patient claims not currently using      Other Visit Diagnoses  Scabies       Relevant Medications   hydrOXYzine (ATARAX) 10 MG tablet      Meds ordered this encounter  Medications   cloNIDine (CATAPRES) 0.2 MG tablet    Sig: Take 1 tablet (0.2 mg total) by mouth 2 (two) times daily.    Dispense:  60 tablet    Refill:  4   divalproex (DEPAKOTE ER) 500 MG 24 hr tablet    Sig: Take 1 tablet (500 mg total) by mouth every evening.    Dispense:  30 tablet    Refill:  6   gabapentin (NEURONTIN) 300 MG capsule    Sig: Take 1 capsule (300 mg total) by mouth 4 (four) times daily as needed.    Dispense:  120 capsule    Refill:  1   hydrOXYzine (ATARAX) 10 MG tablet    Sig: Take 1 tablet (10 mg total) by mouth 3 (three) times daily as needed.    Dispense:  30 tablet    Refill:  0   mirtazapine (REMERON) 15 MG tablet    Sig: Take 1 tablet (15 mg total) by mouth at bedtime.    Dispense:  30 tablet    Refill:  3    Neurogenic pruritis   valsartan-hydrochlorothiazide (DIOVAN-HCT) 320-25 MG tablet    Sig: Take 1 tablet by mouth daily.    Dispense:  90 tablet    Refill:  3   spironolactone (ALDACTONE) 25 MG tablet    Sig: Take 1 tablet (25 mg total) by mouth daily.    Dispense:  60 tablet    Refill:  2   naloxone (NARCAN) nasal spray 4 mg/0.1 mL    Sig: Use if needed for overdose    Dispense:  1 each    Refill:  1  64minutes spent complex that he is high multiple systems assessed We will reschedule mammogram and get the patient scheduled with a provider for Pap smear and have the patient process her colon cancer screening Follow-up: Return in about 1 month (around 05/03/2022).    Asencion Noble, MD

## 2022-04-02 NOTE — Patient Instructions (Addendum)
Focus on smoking cessation by using the nicotine lozenge 4 times daily  Increase clonidine to 0.2 mg twice daily remember to take the second dose during the day  Begin Aldactone 1 pill daily for blood pressure  Stay on amlodipine and valsartan HCT as you are taking daily  All medications were refilled sent to the pharmacy downstairs  Return to see Dr. Joya Gaskins in 1 month for blood pressure reassessment  Call us with your blood pressure readings over the next 2 weeks take your blood pressure at least daily and record numbers and call us with those results in about 2 weeks  Continue to follow the lifestyle medicine handout we discussed and gave you at the last visit you must try to focus on more plant-based foods and watch the salt intake as we discussed  Labs today include metabolic panel and blood counts and another fecal occult kit will be issued for colon cancer screening and please call to get your mammogram scheduled that has already been ordered  A narcan Rx sent to pharmacy

## 2022-04-02 NOTE — Assessment & Plan Note (Signed)
Stable at this time 

## 2022-04-02 NOTE — Assessment & Plan Note (Signed)
Continue with mental health.

## 2022-04-02 NOTE — Progress Notes (Signed)
Pain in left leg. ?

## 2022-04-02 NOTE — Assessment & Plan Note (Signed)
  .   Current smoking consumption amount: 1 pack a day  . Dicsussion on advise to quit smoking and smoking impacts: Cardiovascular impacts  . Patient's willingness to quit: Uncertain  . Methods to quit smoking discussed: Behavioral modification nicotine replacement  . Medication management of smoking session drugs discussed: Nicotine replacement  . Resources provided:  AVS   . Setting quit date not established  . Follow-up arranged 6 week   Time spent counseling the patient: 5 minutes

## 2022-04-02 NOTE — Assessment & Plan Note (Signed)
We will get colon cancer screening and mammogram

## 2022-04-07 ENCOUNTER — Other Ambulatory Visit: Payer: Self-pay

## 2022-04-22 ENCOUNTER — Other Ambulatory Visit: Payer: Self-pay

## 2022-04-22 NOTE — Progress Notes (Unsigned)
Patient appearing on report for [facility] - Hypertension Control report due to last documented ambulatory blood pressure of 199/119 mmHg on 04/02/2022. Next appointment with PCP is 06/03/2022,   Outreached patient to discuss hypertension control and medication management. However, phone number in chart not in service.   Valeda Malm, Pharm.D. PGY-2 Ambulatory Care Pharmacy Resident 04/22/2022 1:27 PM

## 2022-05-20 ENCOUNTER — Encounter (HOSPITAL_COMMUNITY): Payer: Self-pay

## 2022-05-20 ENCOUNTER — Emergency Department (HOSPITAL_COMMUNITY): Payer: Medicaid Other

## 2022-05-20 ENCOUNTER — Emergency Department (HOSPITAL_COMMUNITY)
Admission: EM | Admit: 2022-05-20 | Discharge: 2022-05-20 | Disposition: A | Discharge status: Home / Self Care | Payer: Medicaid Other | Attending: Emergency Medicine | Admitting: Emergency Medicine

## 2022-05-20 ENCOUNTER — Other Ambulatory Visit: Payer: Self-pay

## 2022-05-20 DIAGNOSIS — Z79899 Other long term (current) drug therapy: Secondary | ICD-10-CM | POA: Insufficient documentation

## 2022-05-20 DIAGNOSIS — I10 Essential (primary) hypertension: Secondary | ICD-10-CM | POA: Insufficient documentation

## 2022-05-20 DIAGNOSIS — R079 Chest pain, unspecified: Secondary | ICD-10-CM | POA: Insufficient documentation

## 2022-05-20 DIAGNOSIS — R0602 Shortness of breath: Secondary | ICD-10-CM | POA: Insufficient documentation

## 2022-05-20 LAB — CBC
HCT: 43.2 % (ref 36.0–46.0)
Hemoglobin: 14.3 g/dL (ref 12.0–15.0)
MCH: 30.3 pg (ref 26.0–34.0)
MCHC: 33.1 g/dL (ref 30.0–36.0)
MCV: 91.5 fL (ref 80.0–100.0)
Platelets: 210 10*3/uL (ref 150–400)
RBC: 4.72 MIL/uL (ref 3.87–5.11)
RDW: 14.5 % (ref 11.5–15.5)
WBC: 7.3 10*3/uL (ref 4.0–10.5)
nRBC: 0 % (ref 0.0–0.2)

## 2022-05-20 LAB — BASIC METABOLIC PANEL
Anion gap: 6 (ref 5–15)
BUN: 21 mg/dL — ABNORMAL HIGH (ref 6–20)
CO2: 25 mmol/L (ref 22–32)
Calcium: 9.2 mg/dL (ref 8.9–10.3)
Chloride: 105 mmol/L (ref 98–111)
Creatinine, Ser: 0.68 mg/dL (ref 0.44–1.00)
GFR, Estimated: >60 mL/min (ref >60)
Glucose, Bld: 86 mg/dL (ref 70–99)
Potassium: 4.3 mmol/L (ref 3.5–5.1)
Sodium: 136 mmol/L (ref 135–145)

## 2022-05-20 LAB — BRAIN NATRIURETIC PEPTIDE: B Natriuretic Peptide: 95.2 pg/mL (ref 0.0–100.0)

## 2022-05-20 LAB — TROPONIN I (HIGH SENSITIVITY): Troponin I (High Sensitivity): 4 ng/L (ref <18)

## 2022-05-20 MED ORDER — LIDOCAINE VISCOUS HCL 2 % MT SOLN
15.0000 mL | Freq: Once | OROMUCOSAL | Status: AC
  Administered 2022-05-20: 15 mL via ORAL
  Filled 2022-05-20: qty 15

## 2022-05-20 MED ORDER — CLONIDINE HCL 0.1 MG PO TABS
0.2000 mg | ORAL_TABLET | Freq: Once | ORAL | Status: AC
  Administered 2022-05-20: 0.2 mg via ORAL
  Filled 2022-05-20: qty 2

## 2022-05-20 MED ORDER — ALUM & MAG HYDROXIDE-SIMETH 200-200-20 MG/5ML PO SUSP
30.0000 mL | Freq: Once | ORAL | Status: AC
  Administered 2022-05-20: 30 mL via ORAL
  Filled 2022-05-20: qty 30

## 2022-05-20 MED ORDER — AMLODIPINE BESYLATE 5 MG PO TABS
10.0000 mg | ORAL_TABLET | Freq: Once | ORAL | Status: AC
  Administered 2022-05-20: 10 mg via ORAL
  Filled 2022-05-20: qty 2

## 2022-05-20 NOTE — Discharge Instructions (Signed)
Your work-up was negative.  Take Tylenol Motrin for pain, take your home medicine.  See your main doctor tomorrow for reevaluation.  Return to the ED if the pain changes, worsens or you have new symptoms.

## 2022-05-20 NOTE — ED Provider Notes (Signed)
[facility]-EMERGENCY DEPT Provider Note   CSN: [id number] Arrival date & time: 05/20/22  2683     History  Chief Complaint  Patient presents with   Chest Pain    Ariana Jensen is a 56 y.o. female.   Chest Pain    Patient with medical history of polysubstance use disorder, tobacco abuse, GERD, PVCs, bipolar 1, hypertension, hypertensive urgency presents today due to right-sided chest pain.  Patient states started 2 days ago after being bit by a bug on her right upper extremity.  The pain moves from her shoulder to her right chest, goes to the right side of her back as well.  Is been constant since yesterday but intermittent since the bite.  She feels short of breath, the pain is worse when she lays down flat.  She feels short of breath, not coughing, no fevers, mopped assist lower extremity swelling.  Has not had any medicine prior to arrival, missed her blood pressure medicine earlier today.  Home Medications Prior to Admission medications   Medication Sig Start Date End Date Taking? Authorizing Provider  amLODipine (NORVASC) 10 MG tablet Take 1 tablet (10 mg total) by mouth daily. 01/20/22   [name], MD  Capsaicin 0.035 % CREA Apply to affected areas on skin twice daily 01/20/22   [name], MD  cloNIDine (CATAPRES) 0.2 MG tablet Take 1 tablet (0.2 mg total) by mouth 2 (two) times daily. 04/02/22   [name], MD  divalproex (DEPAKOTE ER) 500 MG 24 hr tablet Take 1 tablet (500 mg total) by mouth every evening. 04/02/22   [name], MD  gabapentin (NEURONTIN) 300 MG capsule Take 1 capsule (300 mg total) by mouth 4 (four) times daily as needed. 04/02/22 04/02/23  [name], MD  hydrOXYzine (ATARAX) 10 MG tablet Take 1 tablet (10 mg total) by mouth 3 (three) times daily as needed. 04/02/22   [name], MD  methadone (DOLOPHINE) 10 MG/ML solution Take 95 mg by mouth daily.    [provider]  mirtazapine (REMERON)  15 MG tablet Take 1 tablet (15 mg total) by mouth at bedtime. 04/02/22   [name], MD  naloxone [facility]) nasal spray 4 mg/0.1 mL Use if needed for overdose 04/02/22   [name], MD  nicotine polacrilex (NICORETTE MINI) 4 MG lozenge Use three times a day for smoking cessation 01/20/22   [name], MD  spironolactone (ALDACTONE) 25 MG tablet Take 1 tablet (25 mg total) by mouth daily. 04/02/22   [name], MD  valsartan-hydrochlorothiazide (DIOVAN-HCT) 320-25 MG tablet Take 1 tablet by mouth daily. 04/02/22   [name], MD      Allergies    Patient has no known allergies.    Review of Systems   Review of Systems  Cardiovascular:  Positive for chest pain.    Physical Exam Updated Vital Signs BP 122/84   Pulse (!) 46   Temp 97.8 F (36.6 C) (Oral)   Resp 12   Ht 5\' 3"  (1.6 m)   Wt 56.7 kg   LMP 12/06/2015   SpO2 92%   BMI 22.14 kg/m  Physical Exam Vitals and nursing note reviewed. Exam conducted with a chaperone present.  Constitutional:      Appearance: Normal appearance.  HENT:     Head: Normocephalic and atraumatic.  Eyes:     General: No scleral icterus.       Right eye: No discharge.  Left eye: No discharge.     Extraocular Movements: Extraocular movements intact.     Pupils: Pupils are equal, round, and reactive to light.  Cardiovascular:     Rate and Rhythm: Normal rate and regular rhythm.     Pulses: Normal pulses.     Heart sounds: Normal heart sounds. No murmur heard.    No friction rub. No gallop.  Pulmonary:     Effort: Pulmonary effort is normal. No respiratory distress.     Breath sounds: Normal breath sounds.  Chest:     Chest wall: Tenderness present.  Abdominal:     General: Abdomen is flat. Bowel sounds are normal. There is no distension.     Palpations: Abdomen is soft.     Tenderness: There is no abdominal tenderness.  Musculoskeletal:     Right lower leg: No edema.     Left lower leg: No edema.  Skin:     General: Skin is warm and dry.     Coloration: Skin is not jaundiced.  Neurological:     Mental Status: She is alert. Mental status is at baseline.     Coordination: Coordination normal.     ED Results / Procedures / Treatments   Labs (all labs ordered are listed, but only abnormal results are displayed) Labs Reviewed  BASIC METABOLIC PANEL - Abnormal; Notable for the following components:      Result Value   BUN 21 (*)    All other components within normal limits  CBC  BRAIN NATRIURETIC PEPTIDE  TROPONIN I (HIGH SENSITIVITY)  TROPONIN I (HIGH SENSITIVITY)    EKG None  Radiology DG Chest 2 View  Result Date: 05/20/2022 CLINICAL DATA:  Chest pain and shortness of breath over the last several days. Chest pain is particularly right-sided. EXAM: CHEST - 2 VIEW COMPARISON:  11/01/2020 FINDINGS: Heart size is normal. Aortic atherosclerosis and tortuosity as seen previously. The lungs are clear. No pneumothorax. No acute bone finding. IMPRESSION: No active disease. No cause of right chest pain identified. Chronic aortic atherosclerosis. Electronically Signed   By: Paulina Fusi M.D.   On: 05/20/2022 07:53    Procedures Procedures    Medications Ordered in ED Medications  cloNIDine (CATAPRES) tablet 0.2 mg (0.2 mg Oral Given 05/20/22 0826)  amLODipine (NORVASC) tablet 10 mg (10 mg Oral Given 05/20/22 0827)  alum & mag hydroxide-simeth (MAALOX/MYLANTA) 200-200-20 MG/5ML suspension 30 mL (30 mLs Oral Given 05/20/22 0827)    And  lidocaine (XYLOCAINE) 2 % viscous mouth solution 15 mL (15 mLs Oral Given 05/20/22 0827)    ED Course/ Medical Decision Making/ A&P                           Medical Decision Making Amount and/or Complexity of Data Reviewed Labs: ordered. Radiology: ordered.  Risk OTC drugs. Prescription drug management.   Patient presents due to chest pain.  Differential includes but not limited to ACS, PE, pneumonia, pericarditis, aortic dissection, esophageal  rupture, hypertensive urgency, potential emergency.  Exam patient is hypertensive but vital signs are stable, not hypoxic.  Lungs clear to auscultation, upper and lower extremity pulses are symmetric.  Regular rhythm and reproducible chest wall tenderness.  I ordered and viewed laboratory work-up. CBC without leukocytosis or anemia. BMP without gross electrolyte derangement or AKI Troponin is 4.  Given pain has been going on constantly for over 24 hours I do not think second troponin is indicated.  Given no  ischemic changes on EKG I do not think this is ACS.  BNP is unremarkable, no sign of acute onset of CHF.  Chest x-ray without pneumonia, acute process. EKG shows sinus rhythm with frequent PVCs.  I ordered patient's home blood pressure medicine as she has not had it yet today.  She does not have any headache, visual complaints.  Do not think CT head is indicated.  Patient's pain is feeling significantly improved after medication.  At this time I do feel patient is stabilized for close outpatient follow-up with her PCP.  Patient is having frequent PVCs which may be contributing.  Doubt ACS given no ischemic findings on EKG and negative troponin.  Considered PE but does not fit clinical presentation.  Considered esophageal rupture and aortic dissection but do not think either these are contributing.  No signs of hypertensive emergency on exam the patient's blood pressure improved to baseline after administering for home medicine.  On reevaluation patient feeling significantly improved.  Based on negative work-up and improvement I think patient is appropriate outpatient follow-up.  We discussed return precautions, discharge stable condition.        Final Clinical Impression(s) / ED Diagnoses Final diagnoses:  Nonspecific chest pain    Rx / DC Orders ED Discharge Orders     None         Sherrill Raring, Hershal Coria 05/20/22 1451    Lacretia Leigh, MD 05/21/22 [phone]

## 2022-05-20 NOTE — ED Triage Notes (Signed)
Pt arrived via POV c/o chest pain and SOB for several days. Right sided, radiates down arm and through both legs. Continuous. Also endorses some SOB

## 2022-06-03 ENCOUNTER — Other Ambulatory Visit: Payer: Self-pay

## 2022-06-03 ENCOUNTER — Telehealth: Payer: Self-pay

## 2022-06-03 ENCOUNTER — Encounter: Payer: Self-pay | Admitting: Critical Care Medicine

## 2022-06-03 ENCOUNTER — Ambulatory Visit: Payer: Medicaid Other | Attending: Critical Care Medicine | Admitting: Critical Care Medicine

## 2022-06-03 VITALS — BP 177/111 | HR 60 | Temp 98.1°F | Ht 63.0 in | Wt 122.4 lb

## 2022-06-03 DIAGNOSIS — I10 Essential (primary) hypertension: Secondary | ICD-10-CM

## 2022-06-03 DIAGNOSIS — I160 Hypertensive urgency: Secondary | ICD-10-CM

## 2022-06-03 DIAGNOSIS — Z1231 Encounter for screening mammogram for malignant neoplasm of breast: Secondary | ICD-10-CM

## 2022-06-03 DIAGNOSIS — Z720 Tobacco use: Secondary | ICD-10-CM

## 2022-06-03 DIAGNOSIS — F17210 Nicotine dependence, cigarettes, uncomplicated: Secondary | ICD-10-CM

## 2022-06-03 DIAGNOSIS — Z23 Encounter for immunization: Secondary | ICD-10-CM

## 2022-06-03 MED ORDER — AMLODIPINE BESYLATE 10 MG PO TABS
10.0000 mg | ORAL_TABLET | Freq: Every day | ORAL | 2 refills | Status: DC
  Filled 2022-06-03: qty 30, 30d supply, fill #0

## 2022-06-03 MED ORDER — CARVEDILOL 25 MG PO TABS
25.0000 mg | ORAL_TABLET | Freq: Two times a day (BID) | ORAL | 3 refills | Status: DC
  Filled 2022-06-03: qty 60, 30d supply, fill #0

## 2022-06-03 MED ORDER — SPIRONOLACTONE 50 MG PO TABS
50.0000 mg | ORAL_TABLET | Freq: Every day | ORAL | 2 refills | Status: DC
  Filled 2022-06-03: qty 30, 30d supply, fill #0

## 2022-06-03 MED ORDER — DIVALPROEX SODIUM ER 500 MG PO TB24
500.0000 mg | ORAL_TABLET | Freq: Every evening | ORAL | 6 refills | Status: DC
Start: 2022-06-03 — End: 2022-09-02
  Filled 2022-06-03: qty 30, 30d supply, fill #0

## 2022-06-03 MED ORDER — CLONIDINE HCL 0.2 MG PO TABS
0.2000 mg | ORAL_TABLET | Freq: Two times a day (BID) | ORAL | 4 refills | Status: DC
  Filled 2022-06-03: qty 60, 30d supply, fill #0

## 2022-06-03 NOTE — Progress Notes (Signed)
Established Patient Office Visit  Subjective:  Patient ID: Ariana Jensen, female    DOB: [birth year]  Age: 56 y.o. MRN: [id number]  CC:  Chief Complaint  Patient presents with   Hypertension    HPI  5/2 This patient returns in follow-up and on arrival blood pressure elevated 194/120.  This is a chronic elevation of blood pressure the patient continues to experience.  She has been seen by 3 other providers since I last saw her in October.  The main interventions of an occasional clonidine tablets given and then she was placed on valsartan HCT 160/25 at the last visit in March.  She has been taking this medication.  She measures her blood pressure at home she gets around 180/116-120.  She is still smoking 1 pack a day of cigarettes.  She has no real symptoms from this.  She complains of severe pruritus that is all over her body for several months.  She states gabapentin being used for chronic pain does nothing for the itching.  She was been given hydroxyzine with no relief.  Given triamcinolone cream with no relief.  She was thought to have scabies and did not respond to permethrin.  She denies seeing any insects in her home.  She has had her home cleaned.  The skin feels like a burning sensation is occurring.  She also now has left hip pain is been going on for several months.  The pruritus is worse at night.  We tried Sinequan for this it did not help her symptoms.  She has been on methadone for 25 years maintains a 95 mg daily dosing from [facility].  Patient has a mammogram scheduled in February as she could not get transportation missed this appointment.  She is yet to process her colon cancer screening kit.  She is trying to get her final paperwork arranged for financial assistance.  Were unable to give the tetanus shot because she is uninsured and does not have her orange card as of yet.  She had does have bipolar and is on Depakote.  She has not seen a mental health provider for many years. BP  scalp derm mammo: scholarship, fobt, pap. Tdap never saw luke for bp fin  7/13 This patient is seen in return follow-up on arrival blood pressure 199/119.  Patient's not been compliant with her medication missing her afternoon dose of clonidine.  Patient also snorted heroin 2 months ago and is on a methadone program.  She is now back in outpatient therapy intensive along with the methadone.  She does not have a Narcan available.  She smokes about a half a pack a day of cigarettes at this time.  She does not eat a healthy diet.  She has a blood pressure meter at home and is running high at home as well.  She never got her mammogram that was ordered previously.  She also needs colon cancer screening process she has lost the kit we gave her previously The patient follows with mental health and remains on the Remeron, Atarax, Depakote and gabapentin.  9/13 Patient is seen in return follow-up on arrival blood pressure still elevated 177/1.  Patient states at home her home meter blood pressures are anywhere from 209/126 to the lowest of 160/110.  She has been consistent with her medications but she did just run out of her clonidine.  She currently is taking the amlodipine 10 mg daily, clonidine 0.2 mg twice daily spironolactone 25 mg daily and valsartan HCT  daily  Patient's been trying to change her lifestyle and eating healthier smoking less  She was in the ER recently with chest pain after a snakebite work-up was negative   She was not able to pick up the fecal occult cancer screening needs to have this done and has yet to hear from the mammogram scholarship program  Past Medical History:  Diagnosis Date   Anxiety    ASCUS (atypical squamous cells of undetermined significance) on Pap smear    Body lice 12/45/8099   Chronic lower back pain    Cocaine abuse (HCC)    crack cocaine   Depression    Fibromyalgia    GERD (gastroesophageal reflux disease)    Hepatitis C    Hepatitis C antibody test  positive    Hypertension    Hypokalemia 03/04/2018   Leukocytosis 03/04/2018   Opioid dependence ([place])    PVC's (premature ventricular contractions) 03/04/2018   Scabies 05/13/2021   Seizures ([place])    "don't know why; I've had 2 in the past year" (08/18/2016)   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   Trichomonas 04/17/2011   Diagnosed 04/02/11 during hospitalization, treated with Flagyl 2g, patient instructed to have partner treated (must follow-up)    Tuberculosis    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 age 26, now s/p 1-yr of multidrug treatment (dates unknown)    Past Surgical History:  Procedure Laterality Date   CESAREAN SECTION  1984   DILATION AND CURETTAGE OF UTERUS     I & D EXTREMITY  10/18/2011   Procedure: IRRIGATION AND DEBRIDEMENT EXTREMITY;  Surgeon: Linna Hoff, MD;  Location: [facility];  Service: Orthopedics;  Laterality: Left;   I & D EXTREMITY  10/21/2011   Procedure: IRRIGATION AND DEBRIDEMENT EXTREMITY;  Surgeon: Linna Hoff, MD;  Location: [facility];  Service: Orthopedics;  Laterality: Left;   SALPINGECTOMY Left March 2010   ectopic pregnancy    Family History  Problem Relation Age of Onset   Heart attack Mother    Heart attack Father    Heart attack Paternal Grandmother    Heart attack Paternal Grandfather    Cirrhosis Brother     Social History   Socioeconomic History   Marital status: Widowed    Spouse name: Not on file   Number of children: 1   Years of education: 7th grade   Highest education level: Not on file  Occupational History   Not on file  Tobacco Use   Smoking status: Every Day    Packs/day: 0.50    Years: 32.00    Total pack years: 16.00    Types: Cigarettes   Smokeless tobacco: Never  Vaping Use   Vaping Use: Never used  Substance and Sexual Activity   Alcohol use: Yes    Alcohol/week: 0.0 standard drinks of alcohol    Comment: occ   Drug use: Yes    Types: Oxycodone, Heroin, Cocaine, IV     Comment: Pt stated "I do heroin"   Sexual activity: Not Currently    Birth control/protection: None  Other Topics Concern   Not on file  Social History Narrative   Lives in [place]   Currently Unemployeed   Social Determinants of Health   Financial Resource Strain: Not on file  Food Insecurity: Not on file  Transportation Needs: Not on file  Physical Activity: Not on file  Stress: Not on file  Social Connections: Not on file  Intimate Partner Violence: Not  on file    Outpatient Medications Prior to Visit  Medication Sig Dispense Refill   gabapentin (NEURONTIN) 300 MG capsule Take 1 capsule (300 mg total) by mouth 4 (four) times daily as needed. 120 capsule 1   hydrOXYzine (ATARAX) 10 MG tablet Take 1 tablet (10 mg total) by mouth 3 (three) times daily as needed. 30 tablet 0   methadone (DOLOPHINE) 10 MG/ML solution Take 95 mg by mouth daily.     mirtazapine (REMERON) 15 MG tablet Take 1 tablet (15 mg total) by mouth at bedtime. 30 tablet 3   naloxone (NARCAN) nasal spray 4 mg/0.1 mL Use if needed for overdose 1 each 1   nicotine polacrilex (NICORETTE MINI) 4 MG lozenge Use three times a day for smoking cessation 100 tablet 4   valsartan-hydrochlorothiazide (DIOVAN-HCT) 320-25 MG tablet Take 1 tablet by mouth daily. 90 tablet 3   amLODipine (NORVASC) 10 MG tablet Take 1 tablet (10 mg total) by mouth daily. 60 tablet 2   cloNIDine (CATAPRES) 0.2 MG tablet Take 1 tablet (0.2 mg total) by mouth 2 (two) times daily. 60 tablet 4   divalproex (DEPAKOTE ER) 500 MG 24 hr tablet Take 1 tablet (500 mg total) by mouth every evening. 30 tablet 6   spironolactone (ALDACTONE) 25 MG tablet Take 1 tablet (25 mg total) by mouth daily. 60 tablet 2   Capsaicin 0.035 % CREA Apply to affected areas on skin twice daily (Patient not taking: Reported on 06/03/2022) 42.5 g 3   No facility-administered medications prior to visit.    No Known Allergies  ROS Review of Systems  Constitutional:  Negative.   HENT: Negative.  Negative for ear pain, postnasal drip, rhinorrhea, sinus pressure, sore throat, trouble swallowing and voice change.   Eyes: Negative.   Respiratory:  Negative for apnea, cough, choking, chest tightness, shortness of breath, wheezing and stridor.   Cardiovascular: Negative.  Negative for chest pain, palpitations and leg swelling.  Gastrointestinal: Negative.  Negative for abdominal distention, abdominal pain, nausea and vomiting.  Genitourinary: Negative.   Musculoskeletal: Negative.  Negative for arthralgias and myalgias.       Left hip pain   Skin:  Negative for rash.       Severe pruritus  Allergic/Immunologic: Negative.  Negative for environmental allergies and food allergies.  Neurological: Negative.  Negative for dizziness, syncope, weakness and headaches.  Hematological: Negative.  Negative for adenopathy. Does not bruise/bleed easily.  Psychiatric/Behavioral:  Positive for decreased concentration. Negative for agitation and sleep disturbance. The patient is nervous/anxious and is hyperactive.       Objective:    Physical Exam Vitals reviewed.  Constitutional:      Appearance: Normal appearance. She is well-developed. She is not diaphoretic.  HENT:     Head: Normocephalic and atraumatic.     Nose: No nasal deformity, septal deviation, mucosal edema or rhinorrhea.     Right Sinus: No maxillary sinus tenderness or frontal sinus tenderness.     Left Sinus: No maxillary sinus tenderness or frontal sinus tenderness.     Mouth/Throat:     Mouth: Mucous membranes are moist.     Pharynx: Oropharynx is clear. No oropharyngeal exudate.  Eyes:     General: No scleral icterus.    Conjunctiva/sclera: Conjunctivae normal.     Pupils: Pupils are equal, round, and reactive to light.  Neck:     Thyroid: No thyromegaly.     Vascular: No carotid bruit or JVD.     Trachea: Trachea  normal. No tracheal tenderness or tracheal deviation.  Cardiovascular:     Rate  and Rhythm: Normal rate and regular rhythm.     Chest Wall: PMI is not displaced.     Pulses: Normal pulses. No decreased pulses.     Heart sounds: Normal heart sounds, S1 normal and S2 normal. Heart sounds not distant. No murmur heard.    No systolic murmur is present.     No diastolic murmur is present.     No friction rub. No gallop. No S3 or S4 sounds.  Pulmonary:     Effort: No tachypnea, accessory muscle usage or respiratory distress.     Breath sounds: No stridor. No decreased breath sounds, wheezing, rhonchi or rales.  Chest:     Chest wall: No tenderness.  Abdominal:     General: Bowel sounds are normal. There is no distension.     Palpations: Abdomen is soft. Abdomen is not rigid.     Tenderness: There is no abdominal tenderness. There is no guarding or rebound.  Musculoskeletal:        General: Normal range of motion.     Cervical back: Normal range of motion and neck supple. No edema, erythema or rigidity. No muscular tenderness. Normal range of motion.  Lymphadenopathy:     Head:     Right side of head: No submental or submandibular adenopathy.     Left side of head: No submental or submandibular adenopathy.     Cervical: No cervical adenopathy.  Skin:    General: Skin is warm and dry.     Coloration: Skin is not pale.     Findings: No rash.     Nails: There is no clubbing.  Neurological:     Mental Status: She is alert and oriented to person, place, and time.     Sensory: No sensory deficit.  Psychiatric:        Attention and Perception: She is inattentive. She does not perceive auditory or visual hallucinations.        Mood and Affect: Mood is anxious. Affect is labile.        Speech: Speech normal.        Behavior: Behavior is agitated and hyperactive. Behavior is not slowed, aggressive, withdrawn or combative. Behavior is cooperative.        Thought Content: Thought content normal. Thought content is not paranoid or delusional. Thought content does not include  homicidal or suicidal ideation. Thought content does not include homicidal or suicidal plan.        Cognition and Memory: Cognition is not impaired. Memory is impaired. She exhibits impaired recent memory. She does not exhibit impaired remote memory.        Judgment: Judgment normal. Judgment is not impulsive.     BP (!) 177/111   Pulse 60   Temp 98.1 F (36.7 C) (Oral)   Ht $R'5\' 3"'aw$  (1.6 m)   Wt 122 lb 6.4 oz (55.5 kg)   LMP 12/06/2015   SpO2 97%   BMI 21.68 kg/m  Wt Readings from Last 3 Encounters:  06/03/22 122 lb 6.4 oz (55.5 kg)  05/20/22 125 lb (56.7 kg)  04/02/22 126 lb 3.2 oz (57.2 kg)     Health Maintenance Due  Topic Date Due   COLON CANCER SCREENING ANNUAL FOBT  Never done   MAMMOGRAM  Never done   Zoster Vaccines- Shingrix (1 of 2) Never done   PAP SMEAR-Modifier  04/09/2018   COVID-19 Vaccine (3 -  Pfizer series) 03/11/2020   TETANUS/TDAP  10/18/2021    There are no preventive care reminders to display for this patient.  Lab Results  Component Value Date   TSH 0.675 05/20/2021   Lab Results  Component Value Date   WBC 7.3 05/20/2022   HGB 14.3 05/20/2022   HCT 43.2 05/20/2022   MCV 91.5 05/20/2022   PLT 210 05/20/2022   Lab Results  Component Value Date   NA 136 05/20/2022   K 4.3 05/20/2022   CO2 25 05/20/2022   GLUCOSE 86 05/20/2022   BUN 21 (H) 05/20/2022   CREATININE 0.68 05/20/2022   BILITOT 0.3 04/07/2021   ALKPHOS 69 03/20/2021   AST 18 04/07/2021   ALT 12 04/07/2021   ALT 12 04/07/2021   PROT 7.1 04/07/2021   ALBUMIN 4.0 03/20/2021   CALCIUM 9.2 05/20/2022   ANIONGAP 6 05/20/2022   EGFR 105 05/20/2021   Lab Results  Component Value Date   CHOL 193 03/20/2021   Lab Results  Component Value Date   HDL 51 03/20/2021   Lab Results  Component Value Date   LDLCALC 109 (H) 03/20/2021   Lab Results  Component Value Date   TRIG 193 (H) 03/20/2021   Lab Results  Component Value Date   CHOLHDL 3.8 03/20/2021   Lab Results   Component Value Date   HGBA1C 5.5 03/20/2021      Assessment & Plan:   Problem List Items Addressed This Visit       Cardiovascular and Mediastinum   HTN (hypertension) - Primary    Hypertensive urgency in the past now has uncontrolled hypertension  Plan to begin carvedilol 25 mg twice daily and increase Aldactone to 50 mg daily Maintain amlodipine 10 mg daily and clonidine 0.2 mg twice daily and valsartan HCT 320/25 daily  Initiate secondary cause of hypertension work-up with renin aldosterone levels and renal vascular ultrasound  Check metabolic panel  Return to see clinical pharmacist 3 to 4 weeks  Return to Dr. Joya Gaskins 3 months      Relevant Medications   amLODipine (NORVASC) 10 MG tablet   cloNIDine (CATAPRES) 0.2 MG tablet   spironolactone (ALDACTONE) 50 MG tablet   carvedilol (COREG) 25 MG tablet     Other   Tobacco abuse       Current smoking consumption amount: 1/2 pack a day  Dicsussion on advise to quit smoking and smoking impacts: Cardiovascular impacts  Patient's willingness to quit: Uncertain  Methods to quit smoking discussed: Behavioral modification nicotine replacement  Medication management of smoking session drugs discussed: Nicotine replacement  Resources provided:  AVS   Setting quit date not established  Follow-up arranged 4 week   Time spent counseling the patient: 5 minutes       Other Visit Diagnoses     Need for immunization against influenza       Relevant Orders   Flu Vaccine QUAD 18mo+IM (Fluarix, Fluzone & Alfiuria Quad PF) (Completed)   Encounter for screening mammogram for malignant neoplasm of breast       Relevant Orders   MM DIGITAL SCREENING BILATERAL      Meds ordered this encounter  Medications   amLODipine (NORVASC) 10 MG tablet    Sig: Take 1 tablet (10 mg total) by mouth daily.    Dispense:  60 tablet    Refill:  2   cloNIDine (CATAPRES) 0.2 MG tablet    Sig: Take 1 tablet (0.2 mg total) by mouth 2  (two)  times daily.    Dispense:  120 tablet    Refill:  4   divalproex (DEPAKOTE ER) 500 MG 24 hr tablet    Sig: Take 1 tablet (500 mg total) by mouth every evening.    Dispense:  60 tablet    Refill:  6   spironolactone (ALDACTONE) 50 MG tablet    Sig: Take 1 tablet (50 mg total) by mouth daily.    Dispense:  60 tablet    Refill:  2   carvedilol (COREG) 25 MG tablet    Sig: Take 1 tablet (25 mg total) by mouth 2 (two) times daily with a meal.    Dispense:  120 tablet    Refill:  3   Follow-up: Return in about 3 months (around 09/02/2022) for htn.    Asencion Noble, MD

## 2022-06-03 NOTE — Telephone Encounter (Signed)
Called to scheduled VAS U/S of Kidney per pcp.  Pt has been scheduled in 09/19@9am .  Pt was given instructions & Location in office.   Pt expressed understanding.---DD,RMA

## 2022-06-03 NOTE — Assessment & Plan Note (Signed)
Hypertensive urgency in the past now has uncontrolled hypertension  Plan to begin carvedilol 25 mg twice daily and increase Aldactone to 50 mg daily Maintain amlodipine 10 mg daily and clonidine 0.2 mg twice daily and valsartan HCT 320/25 daily  Initiate secondary cause of hypertension work-up with renin aldosterone levels and renal vascular ultrasound  Check metabolic panel  Return to see clinical pharmacist 3 to 4 weeks  Return to Dr. Delford Field 3 months

## 2022-06-03 NOTE — Assessment & Plan Note (Addendum)
  .   Current smoking consumption amount: 1/2 pack a day  . Dicsussion on advise to quit smoking and smoking impacts: Cardiovascular impacts  . Patient's willingness to quit: Uncertain  . Methods to quit smoking discussed: Behavioral modification nicotine replacement  . Medication management of smoking session drugs discussed: Nicotine replacement  . Resources provided:  AVS   . Setting quit date not established  . Follow-up arranged 4 week   Time spent counseling the patient: 5 minutes

## 2022-06-03 NOTE — Patient Instructions (Signed)
Increase Aldactone to 50 mg daily can take 2 of the 25 mg daily but the refill will be a single 50 mg tablet daily  Start carvedilol 25 mg twice daily for blood pressure  No other medication changes stay on all other blood pressure medicines as you have been taking  Dr. Delford Field sent refills for a 47-month supply on each of your medications  You received a flu vaccine at this visit  Labs today include evaluation for secondary causes of high blood pressure and will also order an ultrasound of your kidney  Return to see Dr. Delford Field in 3 months and you will see [facility] clinical pharmacy in 4 weeks for follow-up with your blood pressure bring your blood pressure meter and medications with you for this visit

## 2022-06-03 NOTE — Progress Notes (Signed)
Pt request RF on all her Rx's

## 2022-06-05 ENCOUNTER — Other Ambulatory Visit: Payer: Self-pay | Admitting: Obstetrics and Gynecology

## 2022-06-05 DIAGNOSIS — Z1231 Encounter for screening mammogram for malignant neoplasm of breast: Secondary | ICD-10-CM

## 2022-06-08 NOTE — Progress Notes (Signed)
Let pt know kidney normal,  renin is normal meaning no hormonal cause for HTN  awaiting kidney artery [place]

## 2022-06-09 ENCOUNTER — Ambulatory Visit (HOSPITAL_COMMUNITY): Admission: RE | Admit: 2022-06-09 | Payer: Medicaid Other | Source: Ambulatory Visit

## 2022-06-09 ENCOUNTER — Telehealth: Payer: Self-pay

## 2022-06-09 NOTE — Telephone Encounter (Signed)
-----   Message from Elsie Stain, MD sent at 06/08/2022  5:07 PM EDT ----- Let pt know kidney normal,  renin is normal meaning no hormonal cause for HTN  awaiting kidney artery [place]

## 2022-06-09 NOTE — Telephone Encounter (Signed)
Pt was called and is aware of results, DOB was confirmed.  ?

## 2022-06-10 LAB — BMP8+EGFR
BUN/Creatinine Ratio: 25 — ABNORMAL HIGH (ref 9–23)
BUN: 17 mg/dL (ref 6–24)
CO2: 21 mmol/L (ref 20–29)
Calcium: 9.5 mg/dL (ref 8.7–10.2)
Chloride: 101 mmol/L (ref 96–106)
Creatinine, Ser: 0.68 mg/dL (ref 0.57–1.00)
Glucose: 84 mg/dL (ref 70–99)
Potassium: 3.9 mmol/L (ref 3.5–5.2)
Sodium: 140 mmol/L (ref 134–144)
eGFR: 103 mL/min/{1.73_m2} (ref >59)

## 2022-06-10 LAB — ALDOSTERONE + RENIN ACTIVITY W/ RATIO
ALDOS/RENIN RATIO: 2 (ref 0.0–30.0)
ALDOSTERONE: 4.4 ng/dL (ref 0.0–30.0)
Renin: 2.216 ng/mL/hr (ref 0.167–5.380)

## 2022-06-29 ENCOUNTER — Other Ambulatory Visit: Payer: Self-pay

## 2022-06-30 ENCOUNTER — Other Ambulatory Visit: Payer: Self-pay

## 2022-07-02 ENCOUNTER — Ambulatory Visit: Payer: Self-pay | Admitting: *Deleted

## 2022-07-02 ENCOUNTER — Other Ambulatory Visit: Payer: Self-pay | Admitting: Pharmacist

## 2022-07-02 ENCOUNTER — Other Ambulatory Visit: Payer: Self-pay

## 2022-07-02 DIAGNOSIS — B86 Scabies: Secondary | ICD-10-CM

## 2022-07-02 MED ORDER — PERMETHRIN 5 % EX CREA
TOPICAL_CREAM | CUTANEOUS | 0 refills | Status: DC
  Filled 2022-07-02: qty 60, fill #0

## 2022-07-02 NOTE — Telephone Encounter (Signed)
Summary: itching   Pt requesting a Rx for "parenthis cream"   Pt states she is itching really bad   Please assist further    Medication request: permethrin (ELIMITE) 5 % cream hydrOXYzine (ATARAX) 10 MG tablet Reason for Disposition  [1] Prescription refill request for NON-ESSENTIAL medicine (i.e., no harm to patient if med not taken) AND [2] triager unable to refill per department policy  Answer Assessment - Initial Assessment Questions 1. DESCRIPTION: "Describe the itching you are having."     Patient has hx of scabies and feels sure she has it again 2. SEVERITY: "How bad is it?"    - MILD: Doesn't interfere with normal activities.   - MODERATE-SEVERE: Interferes with work, school, sleep, or other activities.      severe 3. SCRATCHING: "Are there any scratch marks? Bleeding?"     Yes- leaving marks 4. ONSET: "When did this begin?"      1 week ago 5. CAUSE: "What do you think is causing the itching?" (ask about swimming pools, pollen, animals, soaps, etc.)     Scabies- same symptoms as previous 6. OTHER SYMPTOMS: "Do you have any other symptoms?"      No other symptoms  7. PREGNANCY: "Is there any chance you are pregnant?" "When was your last menstrual period?"  Answer Assessment - Initial Assessment Questions 1. DRUG NAME: "What medicine do you need to have refilled?"     Patient is requesting RF- permethrin and hydroxyzine 2. REFILLS REMAINING: "How many refills are remaining?" (Note: The label on the medicine or pill bottle will show how many refills are remaining. If there are no refills remaining, then a renewal may be needed.)     none 3. EXPIRATION DATE: "What is the expiration date?" (Note: The label states when the prescription will expire, and thus can no longer be refilled.)       4. PRESCRIBING HCP: "Who prescribed it?" Reason: If prescribed by specialist, call should be referred to that group.     PCP 5. SYMPTOMS: "Do you have any symptoms?"     Widespread  itching- patient believes she has scabies again- reports same symptoms as previous visit.  Patient requesting treatment for scabies  Protocols used: Itching - Widespread-A-AH, Medication Refill and Renewal Call-A-AH

## 2022-07-02 NOTE — Telephone Encounter (Signed)
  Chief Complaint: medication request- widespread itching Symptoms: severe itching- scabies hx Frequency: started 1 week ago Pertinent Negatives: Patient denies   Disposition: [] ED /[] Urgent Care (no appt availability in office) / [] Appointment(In office/virtual)/ []  [facility]/ [] Home Care/ [] Refused Recommended Disposition /[] [facility]/ [x]  Follow-up with PCP Additional Notes: Patient is requesting treatment for scabies - advised provider is not in office this afternoon- unsure if this will be addressed today

## 2022-07-07 ENCOUNTER — Ambulatory Visit: Payer: Medicaid Other | Admitting: Pharmacist

## 2022-07-23 ENCOUNTER — Ambulatory Visit: Payer: Self-pay

## 2022-07-23 ENCOUNTER — Ambulatory Visit: Payer: Self-pay | Admitting: Hematology and Oncology

## 2022-07-23 ENCOUNTER — Ambulatory Visit
Admission: RE | Admit: 2022-07-23 | Discharge: 2022-07-23 | Disposition: A | Discharge status: Home / Self Care | Payer: No Typology Code available for payment source | Source: Ambulatory Visit | Attending: Obstetrics and Gynecology | Admitting: Obstetrics and Gynecology

## 2022-07-23 VITALS — BP 128/90 | Wt 124.7 lb

## 2022-07-23 DIAGNOSIS — Z1231 Encounter for screening mammogram for malignant neoplasm of breast: Secondary | ICD-10-CM

## 2022-07-23 DIAGNOSIS — Z01419 Encounter for gynecological examination (general) (routine) without abnormal findings: Secondary | ICD-10-CM

## 2022-07-23 NOTE — Telephone Encounter (Signed)
  Chief Complaint: High Bp readings, Dizziness, hot flashes, SOB Symptoms: above Frequency: today Pertinent Negatives: Patient denies Chest pain Disposition: [x] ED /[] Urgent Care (no appt availability in office) / [] Appointment(In office/virtual)/ []  [place] [facility]/ [] Home Care/ [] Refused Recommended Disposition /[] [facility]/ []  Follow-up with PCP Additional Notes: PT called with elevated blood pressure readings. Pt is also Dizzy, has a HA and is short of breath. 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 Pt will try to make arrangements for someone to come to take care of him.    Reason for Disposition  [7] Systolic BP  >= 510 OR Diastolic >= 258 AND [5] cardiac (e.g., breathing difficulty, chest pain) or neurologic symptoms (e.g., new-onset blurred or double vision, unsteady gait)  Answer Assessment - Initial Assessment Questions 1. BLOOD PRESSURE: "What is the blood pressure?" "Did you take at least two measurements 5 minutes apart?"     225/137 - 10 minutes ago, before that 207/145, before that 242/123. 2. ONSET: "When did you take your blood pressure?"     Past few minutes 3. HOW: "How did you take your blood pressure?" (e.g., automatic home BP monitor, visiting nurse)     automatic 4. HISTORY: "Do you have a history of high blood pressure?"     yes 5. MEDICINES: "Are you taking any medicines for blood pressure?" "Have you missed any doses recently?"     Yes - Missed the day before yesterday  6. OTHER SYMPTOMS: "Do you have any symptoms?" (e.g., blurred vision, chest pain, difficulty breathing, headache, weakness)     HA, dizziness, hot flashes, Short of breath 7. PREGNANCY: "Is there any chance you are pregnant?" "When was your last menstrual period?"  Protocols used: Blood Pressure - High-A-AH

## 2022-07-23 NOTE — Progress Notes (Signed)
Ms. Olinda Nola is a 56 y.o. No obstetric history on file. female who presents to [facility] clinic today with no complaints.    Pap Smear: Pap smear completed today. Last Pap smear was 2016 at [facility] clinic and was normal. Per patient has no history of an abnormal Pap smear. Last Pap smear result is not available in Epic.   Physical exam: Breasts Breasts symmetrical. No skin abnormalities bilateral breasts. No nipple retraction bilateral breasts. No nipple discharge bilateral breasts. No lymphadenopathy. No lumps palpated bilateral breasts.       Pelvic/Bimanual Ext Genitalia No lesions, no swelling and no discharge observed on external genitalia.        Vagina Vagina pink and normal texture. No lesions or discharge observed in vagina.        Cervix Cervix is present. Cervix pink and of normal texture. No discharge observed.    Uterus Uterus is present and palpable. Uterus in normal position and normal size.        Adnexae Bilateral ovaries present and palpable. No tenderness on palpation.         Rectovaginal No rectal exam completed today since patient had no rectal complaints. No skin abnormalities observed on exam.     Smoking History: Patient has is a current smoker at 1/2 packs per day and was referred to quit line.    Patient Navigation: Patient education provided. Access to services provided for patient through [facility] program. No interpreter provided. No transportation provided   Colorectal Cancer Screening: Per patient has never had colonoscopy completed No complaints today. FIT test given in July per PCP   Breast and Cervical Cancer Risk Assessment: Patient does not have family history of breast cancer, known genetic mutations, or radiation treatment to the chest before age 31. Patient does not have history of cervical dysplasia, immunocompromised, or DES exposure in-utero.  Risk Assessment   No risk assessment data     A: [facility] exam with pap  smear No complaints with benign exam.   P: Referred patient to the [place] for a screening mammogram. Appointment scheduled 07/23/22.  Melodye Ped, NP 07/23/2022 9:44 AM

## 2022-07-23 NOTE — Patient Instructions (Signed)
Taught [name] about breast self awareness. Patient did need a Pap smear today due to last Pap smear was in 2016 per patient. Let her know [facility]CP will cover Pap smears every 3 years unless has a history of abnormal Pap smears. Referred patient to the [facility] [place] for screening mammogram. Appointment scheduled for 07/23/22. Patient aware of appointment and will be there. Let patient know will follow up with her within the next couple weeks with results. Ariana Jensen verbalized understanding. She will return for repeat mammogram in one year and repeat Pap smear in 5 years.   Melodye Ped, NP 9:48 AM

## 2022-07-27 ENCOUNTER — Other Ambulatory Visit: Payer: Self-pay

## 2022-07-27 ENCOUNTER — Telehealth: Payer: Self-pay

## 2022-07-27 DIAGNOSIS — B9689 Other specified bacterial agents as the cause of diseases classified elsewhere: Secondary | ICD-10-CM

## 2022-07-27 LAB — CYTOLOGY - PAP
Comment: NEGATIVE
Diagnosis: NEGATIVE
Diagnosis: REACTIVE
High risk HPV: NEGATIVE

## 2022-07-27 MED ORDER — METRONIDAZOLE 500 MG PO TABS
500.0000 mg | ORAL_TABLET | Freq: Two times a day (BID) | ORAL | 0 refills | Status: AC
  Filled 2022-07-27: qty 14, 7d supply, fill #0

## 2022-07-27 NOTE — Telephone Encounter (Signed)
Attempted to contact patient about pap results but patient's voicemail box was full. Will try back at another time.

## 2022-07-27 NOTE — Telephone Encounter (Signed)
Called patient to give pap smear results. Informed patient that pap smear was normal and HPV was negative. Based on this result her next pap smear will be due in 5 years. Patient voiced understanding. Informed patient that pap was suggestive of BV. Patient has noticed discharge recently. [facility] Department did wet prep last week but she has not heard back from them yet. Let patient know that we can prescribe Flagyl to treat infection. Rx sent to [facility]. Instructed patient to take BID for 7 days, no alcohol while taking this medication. Patient voiced understanding.

## 2022-07-28 NOTE — Telephone Encounter (Signed)
Called patient and scheduled her with luke for a bp check

## 2022-07-31 ENCOUNTER — Other Ambulatory Visit: Payer: Self-pay

## 2022-08-04 ENCOUNTER — Ambulatory Visit: Payer: No Typology Code available for payment source | Admitting: Pharmacist

## 2022-09-01 NOTE — Progress Notes (Unsigned)
Established Patient Office Visit  Subjective:  Patient ID: Ariana Jensen, female    DOB: 1965/12/07  Age: 56 y.o. MRN: [id number]  CC:  No chief complaint on file.   HPI  5/2 This patient returns in follow-up and on arrival blood pressure elevated 194/120.  This is a chronic elevation of blood pressure the patient continues to experience.  She has been seen by 3 other providers since I last saw her in October.  The main interventions of an occasional clonidine tablets given and then she was placed on valsartan HCT 160/25 at the last visit in March.  She has been taking this medication.  She measures her blood pressure at home she gets around 180/116-120.  She is still smoking 1 pack a day of cigarettes.  She has no real symptoms from this.  She complains of severe pruritus that is all over her body for several months.  She states gabapentin being used for chronic pain does nothing for the itching.  She was been given hydroxyzine with no relief.  Given triamcinolone cream with no relief.  She was thought to have scabies and did not respond to permethrin.  She denies seeing any insects in her home.  She has had her home cleaned.  The skin feels like a burning sensation is occurring.  She also now has left hip pain is been going on for several months.  The pruritus is worse at night.  We tried Sinequan for this it did not help her symptoms.  She has been on methadone for 25 years maintains a 95 mg daily dosing from [facility].  Patient has a mammogram scheduled in February as she could not get transportation missed this appointment.  She is yet to process her colon cancer screening kit.  She is trying to get her final paperwork arranged for financial assistance.  Were unable to give the tetanus shot because she is uninsured and does not have her orange card as of yet.  She had does have bipolar and is on Depakote.  She has not seen a mental health provider for many years. BP scalp derm mammo:  scholarship, fobt, pap. Tdap never saw luke for bp fin  7/13 This patient is seen in return follow-up on arrival blood pressure 199/119.  Patient's not been compliant with her medication missing her afternoon dose of clonidine.  Patient also snorted heroin 2 months ago and is on a methadone program.  She is now back in outpatient therapy intensive along with the methadone.  She does not have a Narcan available.  She smokes about a half a pack a day of cigarettes at this time.  She does not eat a healthy diet.  She has a blood pressure meter at home and is running high at home as well.  She never got her mammogram that was ordered previously.  She also needs colon cancer screening process she has lost the kit we gave her previously The patient follows with mental health and remains on the Remeron, Atarax, Depakote and gabapentin.  9/13 Patient is seen in return follow-up on arrival blood pressure still elevated 177/1.  Patient states at home her home meter blood pressures are anywhere from 209/126 to the lowest of 160/110.  She has been consistent with her medications but she did just run out of her clonidine.  She currently is taking the amlodipine 10 mg daily, clonidine 0.2 mg twice daily spironolactone 25 mg daily and valsartan HCT daily  Patient's been trying  to change her lifestyle and eating healthier smoking less  She was in the ER recently with chest pain after a snakebite work-up was negative   She was not able to pick up the fecal occult cancer screening needs to have this done and has yet to hear from the mammogram scholarship program  12/12 colon   pap done neg.  Mammo done neg  HTN (hypertension) - Primary    Hypertensive urgency in the past now has uncontrolled hypertension  Plan to begin carvedilol 25 mg twice daily and increase Aldactone to 50 mg daily Maintain amlodipine 10 mg daily and clonidine 0.2 mg twice daily and valsartan HCT 320/25 daily  Initiate secondary cause of  hypertension work-up with renin aldosterone levels and renal vascular ultrasound  Check metabolic panel  Return to see clinical pharmacist 3 to 4 weeks  Return to Dr. Joya Gaskins 3 months      Past Medical History:  Diagnosis Date   Anxiety    ASCUS (atypical squamous cells of undetermined significance) on Pap smear    Body lice 25/63/8937   Chronic lower back pain    Cocaine abuse (HCC)    crack cocaine   Depression    Fibromyalgia    GERD (gastroesophageal reflux disease)    Hepatitis C    Hepatitis C antibody test positive    Hypertension    Hypokalemia 03/04/2018   Leukocytosis 03/04/2018   Opioid dependence ([place])    PVC's (premature ventricular contractions) 03/04/2018   Scabies 05/13/2021   Seizures ([place])    "don't know why; I've had 2 in the past year" (08/18/2016)   Severe recurrent major depression without psychotic features ([place]) 06/21/2018   Trichomonas 04/17/2011   Diagnosed 04/02/11 during hospitalization, treated with Flagyl 2g, patient instructed to have partner treated (must follow-up)    Tuberculosis    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 age 74, now s/p 1-yr of multidrug treatment (dates unknown)    Past Surgical History:  Procedure Laterality Date   CESAREAN SECTION  1984   DILATION AND CURETTAGE OF UTERUS     I & D EXTREMITY  10/18/2011   Procedure: IRRIGATION AND DEBRIDEMENT EXTREMITY;  Surgeon: Linna Hoff, MD;  Location: [place];  Service: Orthopedics;  Laterality: Left;   I & D EXTREMITY  10/21/2011   Procedure: IRRIGATION AND DEBRIDEMENT EXTREMITY;  Surgeon: Linna Hoff, MD;  Location: [place];  Service: Orthopedics;  Laterality: Left;   SALPINGECTOMY Left March 2010   ectopic pregnancy    Family History  Problem Relation Age of Onset   Heart attack Mother    Heart attack Father    Heart attack Paternal Grandmother    Heart attack Paternal Grandfather    Cirrhosis Brother    Breast cancer Neg Hx     Social History   Socioeconomic History    Marital status: Widowed    Spouse name: Not on file   Number of children: 1   Years of education: 7th grade   Highest education level: Not on file  Occupational History   Not on file  Tobacco Use   Smoking status: Every Day    Packs/day: 0.50    Years: 32.00    Total pack years: 16.00    Types: Cigarettes   Smokeless tobacco: Never  Vaping Use   Vaping Use: Never used  Substance and Sexual Activity   Alcohol use: Not Currently    Comment: occ   Drug use: Not Currently  Types: Oxycodone, Heroin, Cocaine, IV    Comment: Pt stated "I do heroin"   Sexual activity: Not Currently    Birth control/protection: None  Other Topics Concern   Not on file  Social History Narrative   Lives in [place]   Currently Unemployeed   Social Determinants of Health   Financial Resource Strain: Not on file  Food Insecurity: No Food Insecurity (07/23/2022)   Hunger Vital Sign    Worried About Running Out of Food in the Last Year: Never true    Ran Out of Food in the Last Year: Never true  Transportation Needs: Unmet Transportation Needs (07/23/2022)   PRAPARE - Hydrologist (Medical): Yes    Lack of Transportation (Non-Medical): Yes  Physical Activity: Not on file  Stress: Not on file  Social Connections: Not on file  Intimate Partner Violence: Not on file    Outpatient Medications Prior to Visit  Medication Sig Dispense Refill   amLODipine (NORVASC) 10 MG tablet Take 1 tablet (10 mg total) by mouth daily. 60 tablet 2   Capsaicin 0.035 % CREA Apply to affected areas on skin twice daily (Patient not taking: Reported on 06/03/2022) 42.5 g 3   carvedilol (COREG) 25 MG tablet Take 1 tablet (25 mg total) by mouth 2 (two) times daily with a meal. 120 tablet 3   cloNIDine (CATAPRES) 0.2 MG tablet Take 1 tablet (0.2 mg total) by mouth 2 (two) times daily. 120 tablet 4   divalproex (DEPAKOTE ER) 500 MG 24 hr tablet Take 1 tablet (500 mg total) by mouth every  evening. 60 tablet 6   gabapentin (NEURONTIN) 300 MG capsule Take 1 capsule (300 mg total) by mouth 4 (four) times daily as needed. 120 capsule 1   hydrOXYzine (ATARAX) 10 MG tablet Take 1 tablet (10 mg total) by mouth 3 (three) times daily as needed. (Patient not taking: Reported on 07/23/2022) 30 tablet 0   methadone (DOLOPHINE) 10 MG/ML solution Take 95 mg by mouth daily.     mirtazapine (REMERON) 15 MG tablet Take 1 tablet (15 mg total) by mouth at bedtime. 30 tablet 3   naloxone (NARCAN) nasal spray 4 mg/0.1 mL Use if needed for overdose 1 each 1   nicotine polacrilex (NICORETTE MINI) 4 MG lozenge Use three times a day for smoking cessation (Patient not taking: Reported on 07/23/2022) 100 tablet 4   permethrin (ELIMITE) 5 % cream Apply 1 application topically onto affected area(s) once for one dose as directed. (Patient not taking: Reported on 07/23/2022) 60 g 0   spironolactone (ALDACTONE) 50 MG tablet Take 1 tablet (50 mg total) by mouth daily. 60 tablet 2   valsartan-hydrochlorothiazide (DIOVAN-HCT) 320-25 MG tablet Take 1 tablet by mouth daily. 90 tablet 3   No facility-administered medications prior to visit.    No Known Allergies  ROS Review of Systems  Constitutional: Negative.   HENT: Negative.  Negative for ear pain, postnasal drip, rhinorrhea, sinus pressure, sore throat, trouble swallowing and voice change.   Eyes: Negative.   Respiratory:  Negative for apnea, cough, choking, chest tightness, shortness of breath, wheezing and stridor.   Cardiovascular: Negative.  Negative for chest pain, palpitations and leg swelling.  Gastrointestinal: Negative.  Negative for abdominal distention, abdominal pain, nausea and vomiting.  Genitourinary: Negative.   Musculoskeletal: Negative.  Negative for arthralgias and myalgias.       Left hip pain   Skin:  Negative for rash.  Severe pruritus  Allergic/Immunologic: Negative.  Negative for environmental allergies and food allergies.   Neurological: Negative.  Negative for dizziness, syncope, weakness and headaches.  Hematological: Negative.  Negative for adenopathy. Does not bruise/bleed easily.  Psychiatric/Behavioral:  Positive for decreased concentration. Negative for agitation and sleep disturbance. The patient is nervous/anxious and is hyperactive.       Objective:    Physical Exam Vitals reviewed.  Constitutional:      Appearance: Normal appearance. She is well-developed. She is not diaphoretic.  HENT:     Head: Normocephalic and atraumatic.     Nose: No nasal deformity, septal deviation, mucosal edema or rhinorrhea.     Right Sinus: No maxillary sinus tenderness or frontal sinus tenderness.     Left Sinus: No maxillary sinus tenderness or frontal sinus tenderness.     Mouth/Throat:     Mouth: Mucous membranes are moist.     Pharynx: Oropharynx is clear. No oropharyngeal exudate.  Eyes:     General: No scleral icterus.    Conjunctiva/sclera: Conjunctivae normal.     Pupils: Pupils are equal, round, and reactive to light.  Neck:     Thyroid: No thyromegaly.     Vascular: No carotid bruit or JVD.     Trachea: Trachea normal. No tracheal tenderness or tracheal deviation.  Cardiovascular:     Rate and Rhythm: Normal rate and regular rhythm.     Chest Wall: PMI is not displaced.     Pulses: Normal pulses. No decreased pulses.     Heart sounds: Normal heart sounds, S1 normal and S2 normal. Heart sounds not distant. No murmur heard.    No systolic murmur is present.     No diastolic murmur is present.     No friction rub. No gallop. No S3 or S4 sounds.  Pulmonary:     Effort: No tachypnea, accessory muscle usage or respiratory distress.     Breath sounds: No stridor. No decreased breath sounds, wheezing, rhonchi or rales.  Chest:     Chest wall: No tenderness.  Abdominal:     General: Bowel sounds are normal. There is no distension.     Palpations: Abdomen is soft. Abdomen is not rigid.     Tenderness:  There is no abdominal tenderness. There is no guarding or rebound.  Musculoskeletal:        General: Normal range of motion.     Cervical back: Normal range of motion and neck supple. No edema, erythema or rigidity. No muscular tenderness. Normal range of motion.  Lymphadenopathy:     Head:     Right side of head: No submental or submandibular adenopathy.     Left side of head: No submental or submandibular adenopathy.     Cervical: No cervical adenopathy.  Skin:    General: Skin is warm and dry.     Coloration: Skin is not pale.     Findings: No rash.     Nails: There is no clubbing.  Neurological:     Mental Status: She is alert and oriented to person, place, and time.     Sensory: No sensory deficit.  Psychiatric:        Attention and Perception: She is inattentive. She does not perceive auditory or visual hallucinations.        Mood and Affect: Mood is anxious. Affect is labile.        Speech: Speech normal.        Behavior: Behavior is agitated and hyperactive.  Behavior is not slowed, aggressive, withdrawn or combative. Behavior is cooperative.        Thought Content: Thought content normal. Thought content is not paranoid or delusional. Thought content does not include homicidal or suicidal ideation. Thought content does not include homicidal or suicidal plan.        Cognition and Memory: Cognition is not impaired. Memory is impaired. She exhibits impaired recent memory. She does not exhibit impaired remote memory.        Judgment: Judgment normal. Judgment is not impulsive.     LMP 12/06/2015  Wt Readings from Last 3 Encounters:  07/23/22 124 lb 11.2 oz (56.6 kg)  06/03/22 122 lb 6.4 oz (55.5 kg)  05/20/22 125 lb (56.7 kg)     Health Maintenance Due  Topic Date Due   COLON CANCER SCREENING ANNUAL FOBT  Never done   Zoster Vaccines- Shingrix (1 of 2) Never done   DTaP/Tdap/Td (2 - Td or Tdap) 10/18/2021   COVID-19 Vaccine (3 - 2023-24 season) 05/22/2022    There are  no preventive care reminders to display for this patient.  Lab Results  Component Value Date   TSH 0.675 05/20/2021   Lab Results  Component Value Date   WBC 7.3 05/20/2022   HGB 14.3 05/20/2022   HCT 43.2 05/20/2022   MCV 91.5 05/20/2022   PLT 210 05/20/2022   Lab Results  Component Value Date   NA 140 06/03/2022   K 3.9 06/03/2022   CO2 21 06/03/2022   GLUCOSE 84 06/03/2022   BUN 17 06/03/2022   CREATININE 0.68 06/03/2022   BILITOT 0.3 04/07/2021   ALKPHOS 69 03/20/2021   AST 18 04/07/2021   ALT 12 04/07/2021   ALT 12 04/07/2021   PROT 7.1 04/07/2021   ALBUMIN 4.0 03/20/2021   CALCIUM 9.5 06/03/2022   ANIONGAP 6 05/20/2022   EGFR 103 06/03/2022   Lab Results  Component Value Date   CHOL 193 03/20/2021   Lab Results  Component Value Date   HDL 51 03/20/2021   Lab Results  Component Value Date   LDLCALC 109 (H) 03/20/2021   Lab Results  Component Value Date   TRIG 193 (H) 03/20/2021   Lab Results  Component Value Date   CHOLHDL 3.8 03/20/2021   Lab Results  Component Value Date   HGBA1C 5.5 03/20/2021      Assessment & Plan:   Problem List Items Addressed This Visit   None No orders of the defined types were placed in this encounter.  Follow-up: No follow-ups on file.    Asencion Noble, MD

## 2022-09-02 ENCOUNTER — Ambulatory Visit: Payer: Medicaid Other | Attending: Critical Care Medicine | Admitting: Critical Care Medicine

## 2022-09-02 ENCOUNTER — Other Ambulatory Visit: Payer: Self-pay

## 2022-09-02 ENCOUNTER — Encounter: Payer: Self-pay | Admitting: Critical Care Medicine

## 2022-09-02 VITALS — BP 180/100 | HR 59 | Temp 98.0°F | Ht 63.0 in | Wt 129.0 lb

## 2022-09-02 DIAGNOSIS — Z1211 Encounter for screening for malignant neoplasm of colon: Secondary | ICD-10-CM | POA: Diagnosis not present

## 2022-09-02 DIAGNOSIS — I160 Hypertensive urgency: Secondary | ICD-10-CM | POA: Diagnosis not present

## 2022-09-02 DIAGNOSIS — Z79899 Other long term (current) drug therapy: Secondary | ICD-10-CM | POA: Insufficient documentation

## 2022-09-02 DIAGNOSIS — Z136 Encounter for screening for cardiovascular disorders: Secondary | ICD-10-CM

## 2022-09-02 DIAGNOSIS — M25552 Pain in left hip: Secondary | ICD-10-CM | POA: Diagnosis not present

## 2022-09-02 DIAGNOSIS — I10 Essential (primary) hypertension: Secondary | ICD-10-CM | POA: Insufficient documentation

## 2022-09-02 LAB — POCT ABI - SCREENING FOR PILOT NO CHARGE
Left ABI: 1.1
Right ABI: 1.17

## 2022-09-02 MED ORDER — VALSARTAN-HYDROCHLOROTHIAZIDE 320-25 MG PO TABS
1.0000 | ORAL_TABLET | Freq: Every day | ORAL | 3 refills | Status: DC
Start: 2022-09-02 — End: 2022-11-14
  Filled 2022-09-02: qty 30, 30d supply, fill #0

## 2022-09-02 MED ORDER — SPIRONOLACTONE 50 MG PO TABS
50.0000 mg | ORAL_TABLET | Freq: Every day | ORAL | 2 refills | Status: DC
  Filled 2022-09-02: qty 30, 30d supply, fill #0

## 2022-09-02 MED ORDER — DIVALPROEX SODIUM ER 500 MG PO TB24
500.0000 mg | ORAL_TABLET | Freq: Every evening | ORAL | 6 refills | Status: DC
  Filled 2022-09-02: qty 30, 30d supply, fill #0

## 2022-09-02 MED ORDER — GABAPENTIN 300 MG PO CAPS
300.0000 mg | ORAL_CAPSULE | Freq: Four times a day (QID) | ORAL | 1 refills | Status: DC | PRN
  Filled 2022-09-02: qty 120, 30d supply, fill #0
  Filled 2022-09-29: qty 120, 30d supply, fill #1

## 2022-09-02 MED ORDER — CLONIDINE HCL 0.2 MG PO TABS
0.2000 mg | ORAL_TABLET | Freq: Two times a day (BID) | ORAL | 4 refills | Status: DC
Start: 2022-09-02 — End: 2022-11-14
  Filled 2022-09-02: qty 120, 60d supply, fill #0

## 2022-09-02 MED ORDER — AMLODIPINE BESYLATE 10 MG PO TABS
10.0000 mg | ORAL_TABLET | Freq: Every day | ORAL | 2 refills | Status: DC
  Filled 2022-09-02: qty 30, 30d supply, fill #0

## 2022-09-02 MED ORDER — CARVEDILOL 25 MG PO TABS
25.0000 mg | ORAL_TABLET | Freq: Two times a day (BID) | ORAL | 3 refills | Status: DC
  Filled 2022-09-02: qty 60, 30d supply, fill #0

## 2022-09-02 MED ORDER — CLONIDINE HCL 0.2 MG PO TABS
0.2000 mg | ORAL_TABLET | Freq: Once | ORAL | Status: AC
  Administered 2022-09-02: 0.2 mg via ORAL

## 2022-09-02 NOTE — Assessment & Plan Note (Signed)
Patient will pick up colon cancer screening at this visit

## 2022-09-02 NOTE — Patient Instructions (Signed)
No change in existing blood pressure medications  Start hydralazine 1 pill 3 times daily for blood pressure  Kidney ultrasound will be obtained and hip x-rays obtained  Continue to work on smoking cessation use nicotine replacement to reduce nicotine use  Always remember to take your medications for blood pressure before you come to the office  Screening arterial brachial index study was performed  An appointment with Franky Macho our clinical pharmacist will be made in 3 weeks please keep that appointment  Return to Dr. Delford Field 2 months  Labs today include colon cancer screening and metabolic panel

## 2022-09-02 NOTE — Assessment & Plan Note (Addendum)
Hypertensive urgency present have given 0.2 mg of clonidine and blood pressure is now down to 180/100  Patient will resume current medications and will add hydralazine 25 mg 3 times daily  She will return to see clinical pharmacy 3 weeks  Will check a metabolic panel  Smoking cessation counseling given  Renin aldosterone levels normal will obtain renal vascular ultrasound may yet need advanced hypertension clinic

## 2022-09-02 NOTE — Assessment & Plan Note (Signed)
ABI screening study normal  X-ray of the hip will be obtained

## 2022-09-03 LAB — BMP8+EGFR
BUN/Creatinine Ratio: 21 (ref 9–23)
BUN: 14 mg/dL (ref 6–24)
CO2: 21 mmol/L (ref 20–29)
Calcium: 9.4 mg/dL (ref 8.7–10.2)
Chloride: 98 mmol/L (ref 96–106)
Creatinine, Ser: 0.66 mg/dL (ref 0.57–1.00)
Glucose: 91 mg/dL (ref 70–99)
Potassium: 4.4 mmol/L (ref 3.5–5.2)
Sodium: 134 mmol/L (ref 134–144)
eGFR: 104 mL/min/{1.73_m2} (ref >59)

## 2022-09-03 NOTE — Progress Notes (Signed)
Let pt know kidneys normal

## 2022-09-04 ENCOUNTER — Telehealth: Payer: Self-pay | Admitting: Critical Care Medicine

## 2022-09-04 NOTE — Telephone Encounter (Signed)
Pt. Given results, verbalizes understanding. 

## 2022-09-08 ENCOUNTER — Other Ambulatory Visit: Payer: Self-pay

## 2022-09-29 ENCOUNTER — Other Ambulatory Visit: Payer: Self-pay

## 2022-09-30 NOTE — Progress Notes (Deleted)
   S:     PCP: Dr. Joya Gaskins  57 y.o. female who presents for hypertension evaluation, education, and management.  PMH is significant for HTN. GERD, TB, polysubstance use disorder, MDD, PTSD, bipolar 1 disorder.  Patient was referred and last seen by Primary Care Provider, Dr. Joya Gaskins, on 09/02/2022.   Patient consistently has elevated BP and reported medication nonadherence. At last visit on 09/02/2022, BP was 192/110 and 180/100 (following clonidine administration).    Today, patient arrives in *** spirits and presents without *** assistance. *** Denies dizziness, headache, blurred vision, swelling.   Patient reports hypertension was diagnosed in ***.   Family/Social history:  -Fhx: MI (x4), cirrhosis -Alcohol:not currently -AQTMAUQ:3.3 ppd -Illicit substances: heroin, previous cocaine use   Medication adherence *** . Patient has *** taken BP medications today.   Current antihypertensives include: amlodipine 10mg  once daily, carvedilol 25mg  BID, spironolactone 25mg  once daily, valsartan-hctz 320-25mg  once daily, clonidine 0.2mg  BID  Reported home BP readings: ***  Patient reported dietary habits: Eats *** meals/day Breakfast: *** Lunch: *** Dinner: *** Snacks: *** Drinks: ***  Patient-reported exercise habits: ***  ASCVD risk factors include: ***  O:  ROS  Physical Exam  Last 3 Office BP readings: BP Readings from Last 3 Encounters:  09/02/22 (!) 180/100  07/23/22 (!) 128/90  06/03/22 (!) 177/111    BMET    Component Value Date/Time   NA 134 09/02/2022 0955   K 4.4 09/02/2022 0955   CL 98 09/02/2022 0955   CO2 21 09/02/2022 0955   GLUCOSE 91 09/02/2022 0955   GLUCOSE 86 05/20/2022 0920   BUN 14 09/02/2022 0955   CREATININE 0.66 09/02/2022 0955   CREATININE 0.85 08/21/2015 1508   CALCIUM 9.4 09/02/2022 0955   GFRNONAA >60 05/20/2022 0920   GFRNONAA 82 08/21/2015 1508   GFRAA >60 07/14/2018 0000   GFRAA >89 08/21/2015 1508    Renal function: CrCl  cannot be calculated (Patient's most recent lab result is older than the maximum 21 days allowed.).  Clinical ASCVD: {YES/NO:21197} The 10-year ASCVD risk score (Arnett DK, et al., 2019) is: 12.9%   Values used to calculate the score:     Age: 37 years     Sex: Female     Is Non-Hispanic African American: No     Diabetic: No     Tobacco smoker: Yes     Systolic Blood Pressure: 354 mmHg     Is BP treated: Yes     HDL Cholesterol: 51 mg/dL     Total Cholesterol: 193 mg/dL  Patient is participating in a Managed Medicaid Plan:  {MM YES/NO:27447::"Yes"}    A/P: Hypertension diagnosed *** currently *** on current medications. BP goal < 130/80 *** mmHg. Medication adherence appears ***. Control is suboptimal due to ***.  -{Meds adjust:18428} ***.  -Patient educated on purpose, proper use, and potential adverse effects of ***.  -F/u labs ordered - *** -Counseled on lifestyle modifications for blood pressure control including reduced dietary sodium, increased exercise, adequate sleep. -Encouraged patient to check BP at home and bring log of readings to next visit. Counseled on proper use of home BP cuff.    Results reviewed and written information provided.    Written patient instructions provided. Patient verbalized understanding of treatment plan.  Total time in face to face counseling *** minutes.    Follow-up:  Pharmacist ***. PCP clinic visit in February 2024  Maryan Puls, PharmD PGY-1 [facility] Resident

## 2022-10-01 ENCOUNTER — Ambulatory Visit: Payer: Medicaid Other | Admitting: Pharmacist

## 2022-11-12 ENCOUNTER — Ambulatory Visit: Payer: Self-pay | Admitting: Critical Care Medicine

## 2022-11-14 ENCOUNTER — Ambulatory Visit (INDEPENDENT_AMBULATORY_CARE_PROVIDER_SITE_OTHER): Payer: Medicaid Other | Admitting: Family

## 2022-11-14 ENCOUNTER — Encounter: Payer: Self-pay | Admitting: Family

## 2022-11-14 VITALS — BP 108/72 | HR 63 | Resp 16 | Wt 122.4 lb

## 2022-11-14 DIAGNOSIS — M25552 Pain in left hip: Secondary | ICD-10-CM

## 2022-11-14 DIAGNOSIS — I10 Essential (primary) hypertension: Secondary | ICD-10-CM

## 2022-11-14 DIAGNOSIS — Z87898 Personal history of other specified conditions: Secondary | ICD-10-CM | POA: Diagnosis not present

## 2022-11-14 DIAGNOSIS — Z8669 Personal history of other diseases of the nervous system and sense organs: Secondary | ICD-10-CM

## 2022-11-14 DIAGNOSIS — Z720 Tobacco use: Secondary | ICD-10-CM | POA: Diagnosis not present

## 2022-11-14 MED ORDER — DIVALPROEX SODIUM ER 500 MG PO TB24: 500.0000 mg | ORAL_TABLET | Freq: Every evening | ORAL | 6 refills | Status: AC

## 2022-11-14 MED ORDER — GABAPENTIN 300 MG PO CAPS: 300.0000 mg | ORAL_CAPSULE | Freq: Four times a day (QID) | ORAL | 1 refills | Status: DC | PRN

## 2022-11-14 MED ORDER — CLONIDINE HCL 0.2 MG PO TABS: 0.2000 mg | ORAL_TABLET | Freq: Two times a day (BID) | ORAL | 4 refills | Status: DC

## 2022-11-14 MED ORDER — CARVEDILOL 25 MG PO TABS: 25.0000 mg | ORAL_TABLET | Freq: Two times a day (BID) | ORAL | 3 refills | Status: DC

## 2022-11-14 MED ORDER — AMLODIPINE BESYLATE 10 MG PO TABS: 10.0000 mg | ORAL_TABLET | Freq: Every day | ORAL | 2 refills | Status: DC

## 2022-11-14 MED ORDER — SPIRONOLACTONE 50 MG PO TABS: 50.0000 mg | ORAL_TABLET | Freq: Every day | ORAL | 2 refills | Status: DC

## 2022-11-14 MED ORDER — VALSARTAN-HYDROCHLOROTHIAZIDE 320-25 MG PO TABS: 1.0000 | ORAL_TABLET | Freq: Every day | ORAL | 3 refills | Status: DC

## 2022-11-14 NOTE — Progress Notes (Signed)
Established Patient Office Visit  Subjective:  Patient ID: Ariana Jensen, female    DOB: 1965/10/09  Age: 57 y.o. MRN: [id number]  CC:  Chief Complaint  Patient presents with   Hypertension    HPI  5/2 This patient returns in follow-up and on arrival blood pressure elevated 194/120.  This is a chronic elevation of blood pressure the patient continues to experience.  She has been seen by 3 other providers since I last saw her in October.  The main interventions of an occasional clonidine tablets given and then she was placed on valsartan HCT 160/25 at the last visit in March.  She has been taking this medication.  She measures her blood pressure at home she gets around 180/116-120.  She is still smoking 1 pack a day of cigarettes.  She has no real symptoms from this.  She complains of severe pruritus that is all over her body for several months.  She states gabapentin being used for chronic pain does nothing for the itching.  She was been given hydroxyzine with no relief.  Given triamcinolone cream with no relief.  She was thought to have scabies and did not respond to permethrin.  She denies seeing any insects in her home.  She has had her home cleaned.  The skin feels like a burning sensation is occurring.  She also now has left hip pain is been going on for several months.  The pruritus is worse at night.  We tried Sinequan for this it did not help her symptoms.  She has been on methadone for 25 years maintains a 95 mg daily dosing from [facility].  Patient has a mammogram scheduled in February as she could not get transportation missed this appointment.  She is yet to process her colon cancer screening kit.  She is trying to get her final paperwork arranged for financial assistance.  Were unable to give the tetanus shot because she is uninsured and does not have her orange card as of yet.  She had does have bipolar and is on Depakote.  She has not seen a mental health provider for many years. BP  scalp derm mammo: scholarship, fobt, pap. Tdap never saw luke for bp fin  7/13 This patient is seen in return follow-up on arrival blood pressure 199/119.  Patient's not been compliant with her medication missing her afternoon dose of clonidine.  Patient also snorted heroin 2 months ago and is on a methadone program.  She is now back in outpatient therapy intensive along with the methadone.  She does not have a Narcan available.  She smokes about a half a pack a day of cigarettes at this time.  She does not eat a healthy diet.  She has a blood pressure meter at home and is running high at home as well.  She never got her mammogram that was ordered previously.  She also needs colon cancer screening process she has lost the kit we gave her previously The patient follows with mental health and remains on the Remeron, Atarax, Depakote and gabapentin.  9/13 Patient is seen in return follow-up on arrival blood pressure still elevated 177/1.  Patient states at home her home meter blood pressures are anywhere from 209/126 to the lowest of 160/110.  She has been consistent with her medications but she did just run out of her clonidine.  She currently is taking the amlodipine 10 mg daily, clonidine 0.2 mg twice daily spironolactone 25 mg daily and valsartan HCT  daily  Patient's been trying to change her lifestyle and eating healthier smoking less  She was in the ER recently with chest pain after a snakebite work-up was negative   She was not able to pick up the fecal occult cancer screening needs to have this done and has yet to hear from the mammogram scholarship program  12/12 colon   pap done neg.  Mammo done neg  192/110 Patient seen in return follow-up on arrival blood pressure at 192/110.  She did not take her blood pressure medicines this morning.  She is complaining of some shortness of breath mild headaches.  She is taking carvedilol 25 mg twice daily Aldactone 50 mg daily amlodipine 10 mg daily  clonidine point 2 mg twice daily and valsartan HCT 320 mg daily  She denies any chest pain.  She is still smoking half pack a day of cigarettes.  She is still complains of leg and left hip pain.   The patient did get a Pap smear was negative and did get a mammogram in November was normal altered the scholarship program now she has Medicaid  She cannot take statins because of myalgias she is on the Zetia and fish oil she needs cholesterol rechecked  11/14/2022 Ariana Jensen is a 57 year old female who presents to the clinic this morning for follow-up for hypertension and shortness of breath.  She was last seen by her primary care provider, Dr. Joya Gaskins, in December 2023 when she was experiencing shortness of breath.  Patient reports that her symptoms have improved greatly, she is here this morning to refill her BP medications (BP: 108/72). Patient takes amlodipine 10 mg by mouth once a day, Coreg 25 mg twice daily, clonidine 0.2 mg by mouth twice a day, spironolactone 50 mg by mouth once a day and Diovan-HCTZ 320-25 mg 1 tablet by mouth once a day.  Also takes Depakote ER 500 mg 1 tablet by mouth once a day at night for seizures. Patient has a past medical history of hypertension, seizure disorders, tobacco abuse, pain to left hip, PTSD, bipolar 1 disorder, all stable at home.  Patient was supposed to have x-ray to her left hip, but she missed the appointment due to transportation issues.  The appointment was rescheduled for this coming Monday.  Patient reports cutting back on cigarette smoking with the use of patches.  She was smoking more than 1 pack of cigarette daily, now she is using at most 5 cigarettes and reports the patches have been helping a lot. Patient reports no chest pain, fever, chills, shortness of breath, or any other symptoms.  Past Medical History:  Diagnosis Date   Anxiety    ASCUS (atypical squamous cells of undetermined significance) on Pap smear    Body lice [id number]   Chronic  lower back pain    Cocaine abuse ([facility])    crack cocaine   Depression    Fibromyalgia    GERD (gastroesophageal reflux disease)    Hepatitis C    Hepatitis C antibody test positive    Hypertension    Hypokalemia 03/04/2018   Leukocytosis 03/04/2018   Opioid dependence ([place])    PVC's (premature ventricular contractions) 03/04/2018   Scabies 05/13/2021   Seizures ([place])    "don't know why; I've had 2 in the past year" (08/18/2016)   Severe recurrent major depression without psychotic features ([place]) 06/21/2018   Trichomonas 04/17/2011   Diagnosed 04/02/11 during hospitalization, treated with Flagyl 2g, patient instructed to have partner treated (must follow-up)  Tuberculosis    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 age 49, now s/p 1-yr of multidrug treatment (dates unknown)    Past Surgical History:  Procedure Laterality Date   CESAREAN SECTION  72   DILATION AND CURETTAGE OF UTERUS     I & D EXTREMITY  10/18/2011   Procedure: IRRIGATION AND DEBRIDEMENT EXTREMITY;  Surgeon: Linna Hoff, MD;  Location: [facility];  Service: Orthopedics;  Laterality: Left;   I & D EXTREMITY  10/21/2011   Procedure: IRRIGATION AND DEBRIDEMENT EXTREMITY;  Surgeon: Linna Hoff, MD;  Location: [facility];  Service: Orthopedics;  Laterality: Left;   SALPINGECTOMY Left March 2010   ectopic pregnancy    Family History  Problem Relation Age of Onset   Heart attack Mother    Heart attack Father    Heart attack Paternal Grandmother    Heart attack Paternal Grandfather    Cirrhosis Brother    Breast cancer Neg Hx     Social History   Socioeconomic History   Marital status: Widowed    Spouse name: Not on file   Number of children: 1   Years of education: 7th grade   Highest education level: Not on file  Occupational History   Not on file  Tobacco Use   Smoking status: Every Day    Packs/day: 0.50    Years: 32.00    Total pack years: 16.00    Types: Cigarettes   Smokeless tobacco: Never  Vaping Use    Vaping Use: Never used  Substance and Sexual Activity   Alcohol use: Not Currently    Comment: occ   Drug use: Not Currently    Types: Oxycodone, Heroin, Cocaine, IV    Comment: Pt stated "I do heroin"   Sexual activity: Not Currently    Birth control/protection: None  Other Topics Concern   Not on file  Social History Narrative   Lives in [place]   Currently Unemployeed   Social Determinants of Health   Financial Resource Strain: Not on file  Food Insecurity: No Food Insecurity (07/23/2022)   Hunger Vital Sign    Worried About Running Out of Food in the Last Year: Never true    Ran Out of Food in the Last Year: Never true  Transportation Needs: Unmet Transportation Needs (07/23/2022)   PRAPARE - Hydrologist (Medical): Yes    Lack of Transportation (Non-Medical): Yes  Physical Activity: Not on file  Stress: Not on file  Social Connections: Not on file  Intimate Partner Violence: Not on file    Outpatient Medications Prior to Visit  Medication Sig Dispense Refill   Capsaicin 0.035 % CREA Apply to affected areas on skin twice daily (Patient not taking: Reported on 06/03/2022) 42.5 g 3   methadone (DOLOPHINE) 10 MG/ML solution Take 95 mg by mouth daily.     mirtazapine (REMERON) 15 MG tablet Take 1 tablet (15 mg total) by mouth at bedtime. 30 tablet 3   naloxone (NARCAN) nasal spray 4 mg/0.1 mL Use if needed for overdose 1 each 1   nicotine polacrilex (NICORETTE MINI) 4 MG lozenge Use three times a day for smoking cessation 100 tablet 4   permethrin (ELIMITE) 5 % cream Apply 1 application topically onto affected area(s) once for one dose as directed. 60 g 0   amLODipine (NORVASC) 10 MG tablet Take 1 tablet (10 mg total) by mouth daily. 60 tablet 2   carvedilol (COREG) 25 MG tablet Take 1  tablet (25 mg total) by mouth 2 (two) times daily with a meal. 120 tablet 3   cloNIDine (CATAPRES) 0.2 MG tablet Take 1 tablet (0.2 mg total) by mouth 2 (two)  times daily. 120 tablet 4   divalproex (DEPAKOTE ER) 500 MG 24 hr tablet Take 1 tablet (500 mg total) by mouth every evening. 60 tablet 6   gabapentin (NEURONTIN) 300 MG capsule Take 1 capsule (300 mg total) by mouth 4 (four) times daily as needed. 120 capsule 1   spironolactone (ALDACTONE) 50 MG tablet Take 1 tablet (50 mg total) by mouth daily. 60 tablet 2   valsartan-hydrochlorothiazide (DIOVAN-HCT) 320-25 MG tablet Take 1 tablet by mouth daily. 90 tablet 3   No facility-administered medications prior to visit.    No Known Allergies  ROS Review of Systems  Constitutional: Negative.   HENT: Negative.  Negative for ear pain, postnasal drip, rhinorrhea, sinus pressure, sore throat, trouble swallowing and voice change.   Eyes: Negative.   Respiratory: Negative.  Negative for apnea, cough, choking, chest tightness, wheezing and stridor.   Cardiovascular: Negative.  Negative for chest pain, palpitations and leg swelling.  Gastrointestinal: Negative.  Negative for abdominal distention, abdominal pain, nausea and vomiting.  Endocrine: Negative.   Genitourinary: Negative.   Musculoskeletal: Negative.  Negative for arthralgias and myalgias.       Left hip pain   Skin: Negative.  Negative for rash.          Allergic/Immunologic: Negative.  Negative for environmental allergies and food allergies.  Neurological:  Negative for dizziness, syncope and weakness.  Hematological: Negative.  Negative for adenopathy. Does not bruise/bleed easily.  Psychiatric/Behavioral: Negative.  Negative for agitation and sleep disturbance.       Objective:    Physical Exam Vitals reviewed.  Constitutional:      Appearance: Normal appearance. She is well-developed. She is not diaphoretic.  HENT:     Head: Normocephalic and atraumatic.     Nose: No nasal deformity, septal deviation, mucosal edema or rhinorrhea.     Right Sinus: No maxillary sinus tenderness or frontal sinus tenderness.     Left Sinus: No  maxillary sinus tenderness or frontal sinus tenderness.     Mouth/Throat:     Mouth: Mucous membranes are moist.     Pharynx: Oropharynx is clear. No oropharyngeal exudate.  Eyes:     General: No scleral icterus.    Conjunctiva/sclera: Conjunctivae normal.     Pupils: Pupils are equal, round, and reactive to light.  Neck:     Thyroid: No thyromegaly.     Vascular: No carotid bruit or JVD.     Trachea: Trachea normal. No tracheal tenderness or tracheal deviation.  Cardiovascular:     Rate and Rhythm: Normal rate and regular rhythm.     Chest Wall: PMI is not displaced.     Pulses: Normal pulses. No decreased pulses.     Heart sounds: Normal heart sounds, S1 normal and S2 normal. Heart sounds not distant. No murmur heard.    No systolic murmur is present.     No diastolic murmur is present.     No friction rub. No gallop. No S3 or S4 sounds.  Pulmonary:     Effort: No tachypnea, accessory muscle usage or respiratory distress.     Breath sounds: No stridor. No decreased breath sounds, wheezing, rhonchi or rales.  Chest:     Chest wall: No tenderness.  Abdominal:     General: Bowel sounds are normal.  There is no distension.     Palpations: Abdomen is soft. Abdomen is not rigid.     Tenderness: There is no abdominal tenderness. There is no guarding or rebound.  Musculoskeletal:        General: Normal range of motion.     Cervical back: Normal range of motion and neck supple. No edema, erythema or rigidity. No muscular tenderness. Normal range of motion.  Lymphadenopathy:     Head:     Right side of head: No submental or submandibular adenopathy.     Left side of head: No submental or submandibular adenopathy.     Cervical: No cervical adenopathy.  Skin:    General: Skin is warm and dry.     Coloration: Skin is not pale.     Findings: No rash.     Nails: There is no clubbing.  Neurological:     Mental Status: She is alert and oriented to person, place, and time.     Sensory: No  sensory deficit.  Psychiatric:        Attention and Perception: She does not perceive auditory or visual hallucinations.        Speech: Speech normal.        Behavior: Behavior is not slowed, aggressive, withdrawn or combative. Behavior is cooperative.        Thought Content: Thought content normal. Thought content is not paranoid or delusional. Thought content does not include homicidal or suicidal ideation. Thought content does not include homicidal or suicidal plan.        Cognition and Memory: Cognition is not impaired. She does not exhibit impaired remote memory.        Judgment: Judgment normal. Judgment is not impulsive.    BP 108/72   Pulse 63   Resp 16   Wt 122 lb 6.4 oz (55.5 kg)   LMP 12/06/2015   SpO2 91%   BMI 21.68 kg/m  Wt Readings from Last 3 Encounters:  11/14/22 122 lb 6.4 oz (55.5 kg)  09/02/22 129 lb (58.5 kg)  07/23/22 124 lb 11.2 oz (56.6 kg)   Vitals:   11/14/22 0919  BP: 108/72  Pulse: 63  Resp: 16  SpO2: 91%    ABI screen negative indices greater than 1 both legs   Health Maintenance Due  Topic Date Due   COLON CANCER SCREENING ANNUAL FOBT  Never done   Zoster Vaccines- Shingrix (1 of 2) Never done   DTaP/Tdap/Td (2 - Td or Tdap) 10/18/2021   COVID-19 Vaccine (3 - 2023-24 season) 05/22/2022    There are no preventive care reminders to display for this patient.  Lab Results  Component Value Date   TSH 0.675 05/20/2021   Lab Results  Component Value Date   WBC 7.3 05/20/2022   HGB 14.3 05/20/2022   HCT 43.2 05/20/2022   MCV 91.5 05/20/2022   PLT 210 05/20/2022   Lab Results  Component Value Date   NA 134 09/02/2022   K 4.4 09/02/2022   CO2 21 09/02/2022   GLUCOSE 91 09/02/2022   BUN 14 09/02/2022   CREATININE 0.66 09/02/2022   BILITOT 0.3 04/07/2021   ALKPHOS 69 03/20/2021   AST 18 04/07/2021   ALT 12 04/07/2021   ALT 12 04/07/2021   PROT 7.1 04/07/2021   ALBUMIN 4.0 03/20/2021   CALCIUM 9.4 09/02/2022   ANIONGAP 6  05/20/2022   EGFR 104 09/02/2022   Lab Results  Component Value Date   CHOL 193 03/20/2021  Lab Results  Component Value Date   HDL 51 03/20/2021   Lab Results  Component Value Date   LDLCALC 109 (H) 03/20/2021   Lab Results  Component Value Date   TRIG 193 (H) 03/20/2021   Lab Results  Component Value Date   CHOLHDL 3.8 03/20/2021   Lab Results  Component Value Date   HGBA1C 5.5 03/20/2021      Assessment & Plan:   Problem List Items Addressed This Visit       Cardiovascular and Mediastinum   HTN (hypertension) - Primary   Relevant Medications   amLODipine (NORVASC) 10 MG tablet   carvedilol (COREG) 25 MG tablet   cloNIDine (CATAPRES) 0.2 MG tablet   valsartan-hydrochlorothiazide (DIOVAN-HCT) 320-25 MG tablet   spironolactone (ALDACTONE) 50 MG tablet     Other   Tobacco abuse   Left hip pain   Relevant Medications   gabapentin (NEURONTIN) 300 MG capsule   Other Visit Diagnoses     History of shortness of breath       History of seizure disorder       Relevant Medications   divalproex (DEPAKOTE ER) 500 MG 24 hr tablet      Meds ordered this encounter  Medications   amLODipine (NORVASC) 10 MG tablet    Sig: Take 1 tablet (10 mg total) by mouth daily.    Dispense:  60 tablet    Refill:  2    Order Specific Question:   Supervising Provider    Answer:   Margarita Rana, ENOBONG [[id number]]   carvedilol (COREG) 25 MG tablet    Sig: Take 1 tablet (25 mg total) by mouth 2 (two) times daily with a meal.    Dispense:  120 tablet    Refill:  3    Order Specific Question:   Supervising Provider    Answer:   Charlott Rakes [[id number]]   cloNIDine (CATAPRES) 0.2 MG tablet    Sig: Take 1 tablet (0.2 mg total) by mouth 2 (two) times daily.    Dispense:  120 tablet    Refill:  4    Order Specific Question:   Supervising Provider    Answer:   Charlott Rakes [[id number]]   valsartan-hydrochlorothiazide (DIOVAN-HCT) 320-25 MG tablet    Sig: Take 1 tablet by mouth daily.     Dispense:  90 tablet    Refill:  3    Order Specific Question:   Supervising Provider    Answer:   Charlott Rakes [[id number]]   spironolactone (ALDACTONE) 50 MG tablet    Sig: Take 1 tablet (50 mg total) by mouth daily.    Dispense:  60 tablet    Refill:  2    Order Specific Question:   Supervising Provider    Answer:   Charlott Rakes [[id number]]   divalproex (DEPAKOTE ER) 500 MG 24 hr tablet    Sig: Take 1 tablet (500 mg total) by mouth every evening.    Dispense:  60 tablet    Refill:  6    Order Specific Question:   Supervising Provider    Answer:   Charlott Rakes [[id number]]   gabapentin (NEURONTIN) 300 MG capsule    Sig: Take 1 capsule (300 mg total) by mouth 4 (four) times daily as needed.    Dispense:  120 capsule    Refill:  1    Order Specific Question:   Supervising Provider    Answer:   Charlott Rakes [[id number]]   Assessment/plan: Patient is a 57 year old  female seen this morning for follow-up for hypertension and shortness of breath.  Her shortness of breath has resolved.  She is requesting for medication refills for her blood pressure.  Her blood pressure is well-controlled under the current regimen and doses.  She has history of pain to the left hip, has upcoming appointment for x-ray.  Plan:  1) hypertension, unspecified:  -Medications refilled  -Patient instructed to take all her medications including blood pressure medications as ordered.  -Increase physical activities as tolerated.   -Decrease diet salt consumption.  -Follow-up in 3 months with primary care provider. 2) history of shortness of breath:  -Symptoms resolved  -Patient encouraged to cut back on smoking, reports new or worsening symptoms to the clinic or local emergency room.  Verbalized understanding.  3) tobacco abuse:  -Cut back on cigarette smoking and continue using treatment provided medical/behavioral providers  -Follow-up in 3 months with primary care provider.  4) history of seizure  disorder:  -Depakote ER 500 mg tablet pills were refilled.  Patient instructed to take the medication as prescribed.  Verbalized understanding.  -Stay hydrated and get enough rest. 5) left hip pain:  -Patient reminded to get her left hip x-ray taken as scheduled on Monday, November 16, 2022.  -Continue taking gabapentin as prescribed.  -Patient to follow-up with her primary care provider in 3 months.  -Reminded to report new or worsening symptoms to the clinic or local emergency room.  -Use Tylenol as needed for pain.  Follow-up: Return in about 3 months (around 02/12/2023).   Velta Addison, DNP, APRN, FNP-C

## 2022-11-14 NOTE — Patient Instructions (Addendum)
1) Medications refilled 2) Please follow up with your primary care provider (Dr. Joya Gaskins) in 3 months 3) Report new or worsening symptoms to the clinic or loca Emergency room 4) Call or come back if you have any question or concern regarding your health 5) Stay hydrated and get enough rest.

## 2022-11-16 ENCOUNTER — Ambulatory Visit
Admission: RE | Admit: 2022-11-16 | Discharge: 2022-11-16 | Disposition: A | Discharge status: Home / Self Care | Payer: Medicaid Other | Source: Ambulatory Visit | Attending: Critical Care Medicine | Admitting: Critical Care Medicine

## 2022-11-16 DIAGNOSIS — I160 Hypertensive urgency: Secondary | ICD-10-CM

## 2022-11-18 NOTE — Progress Notes (Signed)
Let pt know hip xray was normal

## 2022-11-20 ENCOUNTER — Telehealth: Payer: Self-pay

## 2022-11-20 NOTE — Telephone Encounter (Signed)
Pt was called and no vm was left due to mailbox being full. Information has been sent to nurse pool.  

## 2022-11-20 NOTE — Telephone Encounter (Signed)
-----   Message from Elsie Stain, MD sent at 11/18/2022  5:53 AM EST ----- Let pt know hip xray was normal

## 2023-01-04 ENCOUNTER — Telehealth: Payer: Medicaid Other | Admitting: Physician Assistant

## 2023-01-04 ENCOUNTER — Encounter: Payer: Self-pay | Admitting: Physician Assistant

## 2023-01-04 DIAGNOSIS — B86 Scabies: Secondary | ICD-10-CM

## 2023-01-04 DIAGNOSIS — F17210 Nicotine dependence, cigarettes, uncomplicated: Secondary | ICD-10-CM

## 2023-01-04 MED ORDER — PERMETHRIN 5 % EX CREA: TOPICAL_CREAM | CUTANEOUS | 1 refills | Status: DC

## 2023-01-04 NOTE — Progress Notes (Signed)
Established Patient Office Visit  Subjective   Patient ID: Ariana Jensen, female    DOB: November 22, 1965  Age: 57 y.o. MRN: [id number]  Chief Complaint  Patient presents with   Rash   Virtual Visit via Video Note  I connected with Ariana Jensen on 01/04/23 at  2:40 PM EDT by a video enabled telemedicine application and verified that I am speaking with the correct person using two identifiers.  Location: Patient: Home  Provider: [facility]    I discussed the limitations of evaluation and management by telemedicine and the availability of in person appointments. The patient expressed understanding and agreed to proceed.  History of Present Illness: States that she is experiencing another outbreak of scabies.  States this started a couple of days ago.  States that she has him on her entire body starting on her scalp, face, both arms, stomach and chest, back, and both legs.  States that she does not have rashes in between her fingers and toes as well.  Describes it as very itchy.  States that she is very frustrated, states that she has had her home exterminated as well as even purchased a new mattress.  No other concerns at this time.   Observations/Objective: Medical history and current medications reviewed, no physical exam completed    Past Medical History:  Diagnosis Date   Anxiety    ASCUS (atypical squamous cells of undetermined significance) on Pap smear    Body lice 08/05/2015   Chronic lower back pain    Cocaine abuse (HCC)    crack cocaine   Depression    Fibromyalgia    GERD (gastroesophageal reflux disease)    Hepatitis C    Hepatitis C antibody test positive    Hypertension    Hypokalemia 03/04/2018   Leukocytosis 03/04/2018   Opioid dependence (HCC)    PVC's (premature ventricular contractions) 03/04/2018   Scabies 05/13/2021   Seizures (HCC)    "don't know why; I've had 2 in the past year" (08/18/2016)   Severe recurrent major  depression without psychotic features (HCC) 06/21/2018   Trichomonas 04/17/2011   Diagnosed 04/02/11 during hospitalization, treated with Flagyl 2g, patient instructed to have partner treated (must follow-up)    Tuberculosis    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 age 62, now s/p 1-yr of multidrug treatment (dates unknown)   Social History   Socioeconomic History   Marital status: Widowed    Spouse name: Not on file   Number of children: 1   Years of education: 7th grade   Highest education level: Not on file  Occupational History   Not on file  Tobacco Use   Smoking status: Every Day    Packs/day: 0.50    Years: 32.00    Additional pack years: 0.00    Total pack years: 16.00    Types: Cigarettes   Smokeless tobacco: Never  Vaping Use   Vaping Use: Never used  Substance and Sexual Activity   Alcohol use: Not Currently    Comment: occ   Drug use: Not Currently    Types: Oxycodone, Heroin, Cocaine, IV    Comment: Pt stated "I do heroin"   Sexual activity: Not Currently    Birth control/protection: None  Other Topics Concern   Not on file  Social History Narrative   Lives in [place]   Currently Unemployeed   Social Determinants of Health   Financial Resource Strain: Not on file  Food Insecurity: No Food Insecurity (07/23/2022)  Hunger Vital Sign    Worried About Running Out of Food in the Last Year: Never true    Ran Out of Food in the Last Year: Never true  Transportation Needs: Unmet Transportation Needs (07/23/2022)   PRAPARE - Administrator, Civil Service (Medical): Yes    Lack of Transportation (Non-Medical): Yes  Physical Activity: Not on file  Stress: Not on file  Social Connections: Not on file  Intimate Partner Violence: Not on file   Family History  Problem Relation Age of Onset   Heart attack Mother    Heart attack Father    Heart attack Paternal Grandmother    Heart attack Paternal Grandfather    Cirrhosis Brother    Breast cancer  Neg Hx    No Known Allergies  Review of Systems  Constitutional:  Negative for chills and fever.  HENT: Negative.    Eyes: Negative.   Respiratory:  Negative for shortness of breath.   Cardiovascular:  Negative for chest pain.  Gastrointestinal: Negative.   Genitourinary: Negative.   Musculoskeletal: Negative.   Skin:  Positive for itching and rash.  Neurological: Negative.   Endo/Heme/Allergies: Negative.   Psychiatric/Behavioral: Negative.          Assessment & Plan:   Problem List Items Addressed This Visit   None Visit Diagnoses     Scabies    -  Primary   Relevant Medications   permethrin (ELIMITE) 5 % cream      Assessment and Plan: 1. Scabies Trial permethrin.  Patient education given on supportive care.  Red flags given for prompt reevaluation. - permethrin (ELIMITE) 5 % cream; Apply 1 application topically onto affected area(s) once for one dose as directed.  Dispense: 120 g; Refill: 1   Follow Up Instructions:    I discussed the assessment and treatment plan with the patient. The patient was provided an opportunity to ask questions and all were answered. The patient agreed with the plan and demonstrated an understanding of the instructions.   The patient was advised to call back or seek an in-person evaluation if the symptoms worsen or if the condition fails to improve as anticipated.  I provided 12 minutes of non-face-to-face time during this encounter.   Return if symptoms worsen or fail to improve.    Ariana Knudsen Mayers, PA-C

## 2023-01-04 NOTE — Patient Instructions (Signed)
Ariana Jensen, thank you for joining Roney Jaffe, PA-C for today's virtual visit.  While this provider is not your primary care provider (PCP), if your PCP is located in our provider database this encounter information will be shared with them immediately following your visit.   Consent: (Patient) Ariana Jensen provided verbal consent for this virtual visit at the beginning of the encounter.  Current Medications:  Current Outpatient Medications:    amLODipine (NORVASC) 10 MG tablet, Take 1 tablet (10 mg total) by mouth daily., Disp: 60 tablet, Rfl: 2   Capsaicin 0.035 % CREA, Apply to affected areas on skin twice daily (Patient not taking: Reported on 06/03/2022), Disp: 42.5 g, Rfl: 3   carvedilol (COREG) 25 MG tablet, Take 1 tablet (25 mg total) by mouth 2 (two) times daily with a meal., Disp: 120 tablet, Rfl: 3   cloNIDine (CATAPRES) 0.2 MG tablet, Take 1 tablet (0.2 mg total) by mouth 2 (two) times daily., Disp: 120 tablet, Rfl: 4   divalproex (DEPAKOTE ER) 500 MG 24 hr tablet, Take 1 tablet (500 mg total) by mouth every evening., Disp: 60 tablet, Rfl: 6   gabapentin (NEURONTIN) 300 MG capsule, Take 1 capsule (300 mg total) by mouth 4 (four) times daily as needed., Disp: 120 capsule, Rfl: 1   methadone (DOLOPHINE) 10 MG/ML solution, Take 95 mg by mouth daily., Disp: , Rfl:    mirtazapine (REMERON) 15 MG tablet, Take 1 tablet (15 mg total) by mouth at bedtime., Disp: 30 tablet, Rfl: 3   naloxone (NARCAN) nasal spray 4 mg/0.1 mL, Use if needed for overdose, Disp: 1 each, Rfl: 1   nicotine polacrilex (NICORETTE MINI) 4 MG lozenge, Use three times a day for smoking cessation, Disp: 100 tablet, Rfl: 4   permethrin (ELIMITE) 5 % cream, Apply 1 application topically onto affected area(s) once for one dose as directed., Disp: 120 g, Rfl: 1   spironolactone (ALDACTONE) 50 MG tablet, Take 1 tablet (50 mg total) by mouth daily., Disp: 60 tablet, Rfl: 2   valsartan-hydrochlorothiazide  (DIOVAN-HCT) 320-25 MG tablet, Take 1 tablet by mouth daily., Disp: 90 tablet, Rfl: 3   Medications ordered in this encounter:  Meds ordered this encounter  Medications   permethrin (ELIMITE) 5 % cream    Sig: Apply 1 application topically onto affected area(s) once for one dose as directed.    Dispense:  120 g    Refill:  1    Order Specific Question:   Supervising Provider    Answer:   Quentin Angst [id number]     *If you need refills on other medications prior to your next appointment, please contact your pharmacy*  Follow-Up: Call back or seek an in-person evaluation if the symptoms worsen or if the condition fails to improve as anticipated.  [facility] [phone]  Other Instructions Scabies, Adult  Scabies is a skin condition that happens when very small insects called mites get under your skin. This causes severe itchiness and a rash that looks like pimples. Scabies is contagious. This means it can spread easily from person to person. If you get scabies, the people you live with may get it too. With the right treatment, symptoms often go away in 2-4 weeks. In most cases, scabies does not cause lasting problems. What are the causes? Scabies is caused by tiny mites (Sarcoptes scabiei) that can only be seen with a microscope. The mites get into the top layer of your skin and lay eggs. This  is called an infestation. You may get scabies if: You have close contact with someone who has scabies. You come in contact with items that have the mites on them. These may include towels, bedding, or clothes. What increases the risk? You may be more likely to get scabies if: You live in a nursing home or extended care facility. You spend time in a place where a lot of people live close together, such as a shelter or prison. You have sex with a partner who has scabies. You care for others who are at risk for scabies. What are the signs or symptoms? Symptoms of scabies  include: A rash that looks like pimples. It may include tiny red bumps or blisters. It is often found in the skinfolds or on the hands, wrists, elbows, armpits, chest, waist, groin, or buttocks. Severe itchiness. This is often worse at night. Skin irritation. This can include scaly patches or sores. The bumps from scabies may form a line (burrow) on the skin. The line may look thin, crooked, and grayish-white or skin colored. How is this diagnosed? Scabies may be diagnosed based on a physical exam of your skin. You may also have a skin test done. A sample of your skin may be taken (skin scraping) and looked at under a microscope for signs of mites. How is this treated? Scabies may be treated with: Medicated creams or lotions to kill the mites. The cream or lotion is spread on your whole body and left for a few hours. In most cases, one treatment is enough to kill all the mites. In severe cases, the treatment may need to be done more than once. Medicated cream to help with the itching. Medicines taken by mouth (orally). These may help: Relieve itching. Reduce the swelling and redness. Kill the mites. This treatment may be used in severe cases. Follow these instructions at home: Medicines Take or apply over-the-counter and prescription medicines only as told by your health care provider. Apply medicated cream or lotion as told by your provider. Do not wash off the medicated cream or lotion until enough time has passed or as told by your provider. Skin care Try not to scratch or pick at the affected areas of your skin. Keep your fingernails closely trimmed. This can help reduce injury from scratching. Take cool baths or apply cool, wet cloths to your skin. This can help reduce itching. General instructions Clean all items that you touched in the 3 days before you were diagnosed. This includes bedding, clothes, towels, and furniture. Do this on the same day that you start treatment. Dry-clean  items or use hot water to wash them. Dry them on the hot dry cycle. Place items that cannot be washed into closed, airtight plastic bags for at least 3 days. The mites cannot live for more than 3 days away from human skin. Vacuum your furniture and mattresses. Make sure that other people who may have been infested see a provider. Where to find more information Centers for Disease Control and Prevention (CDC): [employer].no Contact a health care provider if: You have itching that does not go away after 4 weeks of treatment. You keep getting new bumps or burrows. You have redness, swelling, or pain near your rash after treatment. You have fluid, blood, or pus coming from your rash. You get thick crusts or scaly patches over large areas of your skin. You have a fever. This information is not intended to replace advice given to you by your health  care provider. Make sure you discuss any questions you have with your health care provider. Document Revised: 06/15/2022 Document Reviewed: 06/15/2022 Elsevier Patient Education  2023 [employer].

## 2023-01-11 ENCOUNTER — Ambulatory Visit: Payer: Self-pay | Admitting: *Deleted

## 2023-01-11 NOTE — Telephone Encounter (Signed)
Chief Complaint: SOB, hypertension Symptoms: BP 203/130 SOB with minimal exertion, Generalized weakness, "Mild headache."  BP yesterday 190/135 Frequency: yesterday Pertinent Negatives: Patient denies    No missed meds Disposition: ED /[] Urgent Care (no appt availability in office) / Appointment(In office/virtual)/  [facility]/ Home Care/ Refused Recommended Disposition /[] [facility]/  Follow-up with PCP Additional Notes: Advised ED, states "Just want an appointment." Reiterated need for ED eval. States will not go. Teams message sent to Cassandra to alert of situation. Reason for Disposition  [1] Systolic BP  >= 160 OR Diastolic >= 100 AND [2] cardiac (e.g., breathing difficulty, chest pain) or neurologic symptoms (e.g., new-onset blurred or double vision, unsteady gait)  Answer Assessment - Initial Assessment Questions 1. RESPIRATORY STATUS: "Describe your breathing?" (e.g., wheezing, shortness of breath, unable to speak, severe coughing)      With exertion 2. ONSET: "When did this breathing problem begin?"      *No Answer* 3. PATTERN "Does the difficult breathing come and go, or has it been constant since it started?"      *No Answer* 4. SEVERITY: "How bad is your breathing?" (e.g., mild, moderate, severe)    - MILD: No SOB at rest, mild SOB with walking, speaks normally in sentences, can lie down, no retractions, pulse < 100.    - MODERATE: SOB at rest, SOB with minimal exertion and prefers to sit, cannot lie down flat, speaks in phrases, mild retractions, audible wheezing, pulse 100-120.    - SEVERE: Very SOB at rest, speaks in single words, struggling to breathe, sitting hunched forward, retractions, pulse > 120      *No Answer* 5. RECURRENT SYMPTOM: "Have you had difficulty breathing before?" If Yes, ask: "When was the last time?" and "What happened that time?"      *No Answer* 6. CARDIAC HISTORY: "Do you have any history of heart disease?"  (e.g., heart attack, angina, bypass surgery, angioplasty)      *No Answer* 7. LUNG HISTORY: "Do you have any history of lung disease?"  (e.g., pulmonary embolus, asthma, emphysema)     *No Answer* 8. CAUSE: "What do you think is causing the breathing problem?"      *No Answer* 9. OTHER SYMPTOMS: "Do you have any other symptoms? (e.g., dizziness, runny nose, cough, chest pain, fever)     BP "190/135"   yesterday, during call 203/130  Generalized weakness, headache "Not bad."  Generalized weakness 10. O2 SATURATION MONITOR:  "Do you use an oxygen saturation monitor (pulse oximeter) at home?" If Yes, ask: "What is your reading (oxygen level) today?" "What is your usual oxygen saturation reading?" (e.g., 95%)       NA  Answer Assessment - Initial Assessment Questions 1. BLOOD PRESSURE: "What is the blood pressure?" "Did you take at least two measurements 5 minutes apart?"     203/130  during call 2. ONSET: "When did you take your blood pressure?"     During call. 190/ 135 yesterday 3. HOW: "How did you take your blood pressure?" (e.g., automatic home BP monitor, visiting nurse)     Home monitor 4. HISTORY: "Do you have a history of high blood pressure?"     yes 5. MEDICINES: "Are you taking any medicines for blood pressure?" "Have you missed any doses recently?"     No missed meds 6. OTHER SYMPTOMS: "Do you have any symptoms?" (e.g., blurred vision, chest pain, difficulty breathing, headache, weakness)     Mild headache, SOB with minimal exertion  Protocols used: Breathing Difficulty-A-AH, Blood Pressure - High-A-AH

## 2023-01-11 NOTE — Telephone Encounter (Addendum)
Spoke with patient . Verified name & DOB . Patient voiced that she is having a HA  6 on 1 -10 scale. BP is 203/130 and had c/o of SOB. Patient is refusing 911 or ED. Was able to convince patient to do to [facility].

## 2023-01-14 ENCOUNTER — Ambulatory Visit: Payer: Self-pay | Admitting: *Deleted

## 2023-01-14 ENCOUNTER — Other Ambulatory Visit: Payer: Self-pay | Admitting: Critical Care Medicine

## 2023-01-14 DIAGNOSIS — M25552 Pain in left hip: Secondary | ICD-10-CM

## 2023-01-14 MED ORDER — GABAPENTIN 300 MG PO CAPS: 300.0000 mg | ORAL_CAPSULE | Freq: Four times a day (QID) | ORAL | 1 refills | Status: DC | PRN

## 2023-01-14 NOTE — Telephone Encounter (Signed)
Requested Prescriptions  Pending Prescriptions Disp Refills   gabapentin (NEURONTIN) 300 MG capsule 120 capsule 1    Sig: Take 1 capsule (300 mg total) by mouth 4 (four) times daily as needed.     Neurology: Anticonvulsants - gabapentin Passed - 01/14/2023  4:54 PM      Passed - Cr in normal range and within 360 days    Creat  Date Value Ref Range Status  08/21/2015 0.85 0.50 - 1.10 mg/dL Final   Creatinine, Ser  Date Value Ref Range Status  09/02/2022 0.66 0.57 - 1.00 mg/dL Final   Creatinine,U  Date Value Ref Range Status  08/21/2015 122.71 mg/dL Final    Comment:       Cutoff Values for Urine Drug Screen, Pain Mgmt          Drug Class           Cutoff (ng/mL)          Amphetamines             500          Barbiturates             200          Cocaine Metabolites      150          Benzodiazepines          200          Methadone                300          Opiates                  300          Phencyclidine             25          Propoxyphene             300          Marijuana Metabolites     50    For medical purposes only.    Creatinine, Urine  Date Value Ref Range Status  02/10/2018 129.97 mg/dL Final    Comment:    Performed at [facility], [address], [place], [place] [address]         Passed - Completed PHQ-2 or PHQ-9 in the last 360 days      Passed - Valid encounter within last 12 months    Recent Outpatient Visits           2 months ago Hypertension, unspecified type   [facility], Jomarie Longs, FNP   4 months ago Hypertensive urgency   [place] [facility] [name], MD   7 months ago Hypertensive urgency   [facility] [name], MD   9 months ago Hypertensive urgency   [place] [facility] [name], MD   11 months ago Pruritic dermatitis   [place] [facility] [name], MD       Future Appointments             In 1 month [place] Charlcie Cradle, MD [facility]

## 2023-01-14 NOTE — Telephone Encounter (Signed)
  Chief Complaint: worsening left hip pain . Requesting refill of gabapentin early Symptoms: worsening pain left hip, shooting down leg. Has been taking gabapentin more often than normal limping and awakens at night  Frequency: past month Pertinent Negatives: Patient denies na  Disposition: ED /[] Urgent Care (no appt availability in office) / Appointment(In office/virtual)/  [facility]/ Home Care/ Refused Recommended Disposition /[] [facility]/  Follow-up with PCP Additional Notes:   Recommended Mobile bus and patient declined  or ED if pain worsens. Requesting appt none available on wait list. Please advise if gabapentin can be refilled early or does increased.      Reason for Disposition  [1] SEVERE pain (e.g., excruciating, unable to do any normal activities) AND [2] not improved after 2 hours of pain medicine  Answer Assessment - Initial Assessment Questions 1. LOCATION and RADIATION: "Where is the pain located?"      Left hip pain  2. QUALITY: "What does the pain feel like?"  (e.g., sharp, dull, aching, burning)     Sharper and lasting longer  3. SEVERITY: "How bad is the pain?" "What does it keep you from doing?"   (Scale 1-10; or mild, moderate, severe)   -  MILD (1-3): doesn't interfere with normal activities    -  MODERATE (4-7): interferes with normal activities (e.g., work or school) or awakens from sleep, limping    -  SEVERE (8-10): excruciating pain, unable to do any normal activities, unable to walk     Limping and awakens with pain 4. ONSET: "When did the pain start?" "Does it come and go, or is it there all the time?"     Constant  5. WORK OR EXERCISE: "Has there been any recent work or exercise that involved this part of the body?"      na 6. CAUSE: "What do you think is causing the hip pain?"      Not sure  7. AGGRAVATING FACTORS: "What makes the hip pain worse?" (e.g., walking, climbing stairs, running)     Movement  8. OTHER  SYMPTOMS: "Do you have any other symptoms?" (e.g., back pain, pain shooting down leg,  fever, rash)     Pain shooting down left leg worse pain x 1 month  Protocols used: Hip Pain-A-AH

## 2023-01-14 NOTE — Telephone Encounter (Signed)
Medication Refill - Medication: gabapentin (NEURONTIN) 300 MG capsule [[id number]]   Has the patient contacted their pharmacy? Yes.   (Agent: If no, request that the patient contact the pharmacy for the refill. If patient does not wish to contact the pharmacy document the reason why and proceed with request.) (Agent: If yes, when and what did the pharmacy advise?)  Preferred Pharmacy (with phone number or street name): [id number] Ginette Otto, [place] - [address] AT [facility] [address] [place], [place] [address] Phone: [phone]  Has the patient been seen for an appointment in the last year OR does the patient have an upcoming appointment? Yes.    Agent: Please be advised that RX refills may take up to 3 business days. We ask that you follow-up with your pharmacy.

## 2023-02-14 NOTE — Progress Notes (Unsigned)
Established Patient Office Visit  Subjective:  Patient ID: Ariana Jensen, female    DOB: 16-Apr-1966  Age: 57 y.o. MRN: [id number]  CC:  No chief complaint on file.   HPI  01/20/22 This patient returns in follow-up and on arrival blood pressure elevated 194/120.  This is a chronic elevation of blood pressure the patient continues to experience.  She has been seen by 3 other providers since I last saw her in October.  The main interventions of an occasional clonidine tablets given and then she was placed on valsartan HCT 160/25 at the last visit in March.  She has been taking this medication.  She measures her blood pressure at home she gets around 180/116-120.  She is still smoking 1 pack a day of cigarettes.  She has no real symptoms from this.  She complains of severe pruritus that is all over her body for several months.  She states gabapentin being used for chronic pain does nothing for the itching.  She was been given hydroxyzine with no relief.  Given triamcinolone cream with no relief.  She was thought to have scabies and did not respond to permethrin.  She denies seeing any insects in her home.  She has had her home cleaned.  The skin feels like a burning sensation is occurring.  She also now has left hip pain is been going on for several months.  The pruritus is worse at night.  We tried Sinequan for this it did not help her symptoms.  She has been on methadone for 25 years maintains a 95 mg daily dosing from [facility].  Patient has a mammogram scheduled in February as she could not get transportation missed this appointment.  She is yet to process her colon cancer screening kit.  She is trying to get her final paperwork arranged for financial assistance.  Were unable to give the tetanus shot because she is uninsured and does not have her orange card as of yet.  She had does have bipolar and is on Depakote.  She has not seen a mental health provider for many years. BP scalp derm mammo:  scholarship, fobt, pap. Tdap never saw luke for bp fin  7/13 This patient is seen in return follow-up on arrival blood pressure 199/119.  Patient's not been compliant with her medication missing her afternoon dose of clonidine.  Patient also snorted heroin 2 months ago and is on a methadone program.  She is now back in outpatient therapy intensive along with the methadone.  She does not have a Narcan available.  She smokes about a half a pack a day of cigarettes at this time.  She does not eat a healthy diet.  She has a blood pressure meter at home and is running high at home as well.  She never got her mammogram that was ordered previously.  She also needs colon cancer screening process she has lost the kit we gave her previously The patient follows with mental health and remains on the Remeron, Atarax, Depakote and gabapentin.  9/13 Patient is seen in return follow-up on arrival blood pressure still elevated 177/1.  Patient states at home her home meter blood pressures are anywhere from 209/126 to the lowest of 160/110.  She has been consistent with her medications but she did just run out of her clonidine.  She currently is taking the amlodipine 10 mg daily, clonidine 0.2 mg twice daily spironolactone 25 mg daily and valsartan HCT daily  Patient's been trying  to change her lifestyle and eating healthier smoking less  She was in the ER recently with chest pain after a snakebite work-up was negative   She was not able to pick up the fecal occult cancer screening needs to have this done and has yet to hear from the mammogram scholarship program  12/12 colon   pap done neg.  Mammo done neg  192/110 Patient seen in return follow-up on arrival blood pressure at 192/110.  She did not take her blood pressure medicines this morning.  She is complaining of some shortness of breath mild headaches.  She is taking carvedilol 25 mg twice daily Aldactone 50 mg daily amlodipine 10 mg daily clonidine point 2 mg  twice daily and valsartan HCT 320 mg daily  She denies any chest pain.  She is still smoking half pack a day of cigarettes.  She is still complains of leg and left hip pain.   The patient did get a Pap smear was negative and did get a mammogram in November was normal altered the scholarship program now she has Medicaid  She cannot take statins because of myalgias she is on the Zetia and fish oil she needs cholesterol rechecked  02/16/23 ?statin  Past Medical History:  Diagnosis Date   Anxiety    ASCUS (atypical squamous cells of undetermined significance) on Pap smear    Body lice 08/05/2015   Chronic lower back pain    Cocaine abuse (HCC)    crack cocaine   Depression    Fibromyalgia    GERD (gastroesophageal reflux disease)    Hepatitis C    Hepatitis C antibody test positive    Hypertension    Hypokalemia 03/04/2018   Leukocytosis 03/04/2018   Opioid dependence (HCC)    PVC's (premature ventricular contractions) 03/04/2018   Scabies 05/13/2021   Seizures (HCC)    "don't know why; I've had 2 in the past year" (08/18/2016)   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   Trichomonas 04/17/2011   Diagnosed 04/02/11 during hospitalization, treated with Flagyl 2g, patient instructed to have partner treated (must follow-up)    Tuberculosis    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 age 59, now s/p 1-yr of multidrug treatment (dates unknown)    Past Surgical History:  Procedure Laterality Date   CESAREAN SECTION  1984   DILATION AND CURETTAGE OF UTERUS     I & D EXTREMITY  10/18/2011   Procedure: IRRIGATION AND DEBRIDEMENT EXTREMITY;  Surgeon: Sharma Covert, MD;  Location: [facility] OR;  Service: Orthopedics;  Laterality: Left;   I & D EXTREMITY  10/21/2011   Procedure: IRRIGATION AND DEBRIDEMENT EXTREMITY;  Surgeon: Sharma Covert, MD;  Location: [facility] OR;  Service: Orthopedics;  Laterality: Left;   SALPINGECTOMY Left March 2010   ectopic pregnancy     Family History  Problem Relation Age of Onset   Heart attack Mother    Heart attack Father    Heart attack Paternal Grandmother    Heart attack Paternal Grandfather    Cirrhosis Brother    Breast cancer Neg Hx     Social History   Socioeconomic History   Marital status: Widowed    Spouse name: Not on file   Number of children: 1   Years of education: 7th grade   Highest education level: Not on file  Occupational History   Not on file  Tobacco Use   Smoking status: Every Day    Packs/day: 0.50  Years: 32.00    Additional pack years: 0.00    Total pack years: 16.00    Types: Cigarettes   Smokeless tobacco: Never  Vaping Use   Vaping Use: Never used  Substance and Sexual Activity   Alcohol use: Not Currently    Comment: occ   Drug use: Not Currently    Types: Oxycodone, Heroin, Cocaine, IV    Comment: Pt stated "I do heroin"   Sexual activity: Not Currently    Birth control/protection: None  Other Topics Concern   Not on file  Social History Narrative   Lives in [place]   Currently Unemployeed   Social Determinants of Health   Financial Resource Strain: Not on file  Food Insecurity: No Food Insecurity (07/23/2022)   Hunger Vital Sign    Worried About Running Out of Food in the Last Year: Never true    Ran Out of Food in the Last Year: Never true  Transportation Needs: Unmet Transportation Needs (07/23/2022)   PRAPARE - Administrator, Civil Service (Medical): Yes    Lack of Transportation (Non-Medical): Yes  Physical Activity: Not on file  Stress: Not on file  Social Connections: Not on file  Intimate Partner Violence: Not on file    Outpatient Medications Prior to Visit  Medication Sig Dispense Refill   amLODipine (NORVASC) 10 MG tablet Take 1 tablet (10 mg total) by mouth daily. 60 tablet 2   Capsaicin 0.035 % CREA Apply to affected areas on skin twice daily (Patient not taking: Reported on 06/03/2022) 42.5 g 3   carvedilol (COREG) 25  MG tablet Take 1 tablet (25 mg total) by mouth 2 (two) times daily with a meal. 120 tablet 3   cloNIDine (CATAPRES) 0.2 MG tablet Take 1 tablet (0.2 mg total) by mouth 2 (two) times daily. 120 tablet 4   divalproex (DEPAKOTE ER) 500 MG 24 hr tablet Take 1 tablet (500 mg total) by mouth every evening. 60 tablet 6   gabapentin (NEURONTIN) 300 MG capsule Take 1 capsule (300 mg total) by mouth 4 (four) times daily as needed. 120 capsule 1   methadone (DOLOPHINE) 10 MG/ML solution Take 95 mg by mouth daily.     mirtazapine (REMERON) 15 MG tablet Take 1 tablet (15 mg total) by mouth at bedtime. 30 tablet 3   naloxone (NARCAN) nasal spray 4 mg/0.1 mL Use if needed for overdose 1 each 1   nicotine polacrilex (NICORETTE MINI) 4 MG lozenge Use three times a day for smoking cessation 100 tablet 4   permethrin (ELIMITE) 5 % cream Apply 1 application topically onto affected area(s) once for one dose as directed. 120 g 1   spironolactone (ALDACTONE) 50 MG tablet Take 1 tablet (50 mg total) by mouth daily. 60 tablet 2   valsartan-hydrochlorothiazide (DIOVAN-HCT) 320-25 MG tablet Take 1 tablet by mouth daily. 90 tablet 3   No facility-administered medications prior to visit.    No Known Allergies  ROS Review of Systems  Constitutional: Negative.   HENT: Negative.  Negative for ear pain, postnasal drip, rhinorrhea, sinus pressure, sore throat, trouble swallowing and voice change.   Eyes: Negative.   Respiratory:  Positive for shortness of breath. Negative for apnea, cough, choking, chest tightness, wheezing and stridor.   Cardiovascular: Negative.  Negative for chest pain, palpitations and leg swelling.  Gastrointestinal: Negative.  Negative for abdominal distention, abdominal pain, nausea and vomiting.  Genitourinary: Negative.   Musculoskeletal: Negative.  Negative for arthralgias and myalgias.  Left hip pain   Skin:  Negative for rash.       Severe pruritus  Allergic/Immunologic: Negative.   Negative for environmental allergies and food allergies.  Neurological:  Positive for headaches. Negative for dizziness, syncope and weakness.  Hematological: Negative.  Negative for adenopathy. Does not bruise/bleed easily.  Psychiatric/Behavioral:  Positive for decreased concentration. Negative for agitation and sleep disturbance. The patient is nervous/anxious and is hyperactive.       Objective:    Physical Exam Vitals reviewed.  Constitutional:      Appearance: Normal appearance. She is well-developed. She is not diaphoretic.  HENT:     Head: Normocephalic and atraumatic.     Nose: No nasal deformity, septal deviation, mucosal edema or rhinorrhea.     Right Sinus: No maxillary sinus tenderness or frontal sinus tenderness.     Left Sinus: No maxillary sinus tenderness or frontal sinus tenderness.     Mouth/Throat:     Mouth: Mucous membranes are moist.     Pharynx: Oropharynx is clear. No oropharyngeal exudate.  Eyes:     General: No scleral icterus.    Conjunctiva/sclera: Conjunctivae normal.     Pupils: Pupils are equal, round, and reactive to light.  Neck:     Thyroid: No thyromegaly.     Vascular: No carotid bruit or JVD.     Trachea: Trachea normal. No tracheal tenderness or tracheal deviation.  Cardiovascular:     Rate and Rhythm: Normal rate and regular rhythm.     Chest Wall: PMI is not displaced.     Pulses: Normal pulses. No decreased pulses.     Heart sounds: Normal heart sounds, S1 normal and S2 normal. Heart sounds not distant. No murmur heard.    No systolic murmur is present.     No diastolic murmur is present.     No friction rub. No gallop. No S3 or S4 sounds.  Pulmonary:     Effort: No tachypnea, accessory muscle usage or respiratory distress.     Breath sounds: No stridor. No decreased breath sounds, wheezing, rhonchi or rales.  Chest:     Chest wall: No tenderness.  Abdominal:     General: Bowel sounds are normal. There is no distension.      Palpations: Abdomen is soft. Abdomen is not rigid.     Tenderness: There is no abdominal tenderness. There is no guarding or rebound.  Musculoskeletal:        General: Normal range of motion.     Cervical back: Normal range of motion and neck supple. No edema, erythema or rigidity. No muscular tenderness. Normal range of motion.  Lymphadenopathy:     Head:     Right side of head: No submental or submandibular adenopathy.     Left side of head: No submental or submandibular adenopathy.     Cervical: No cervical adenopathy.  Skin:    General: Skin is warm and dry.     Coloration: Skin is not pale.     Findings: No rash.     Nails: There is no clubbing.  Neurological:     Mental Status: She is alert and oriented to person, place, and time.     Sensory: No sensory deficit.  Psychiatric:        Attention and Perception: She is inattentive. She does not perceive auditory or visual hallucinations.        Mood and Affect: Mood is anxious. Affect is labile.  Speech: Speech normal.        Behavior: Behavior is agitated and hyperactive. Behavior is not slowed, aggressive, withdrawn or combative. Behavior is cooperative.        Thought Content: Thought content normal. Thought content is not paranoid or delusional. Thought content does not include homicidal or suicidal ideation. Thought content does not include homicidal or suicidal plan.        Cognition and Memory: Cognition is not impaired. Memory is impaired. She exhibits impaired recent memory. She does not exhibit impaired remote memory.        Judgment: Judgment normal. Judgment is not impulsive.     LMP 12/06/2015  Wt Readings from Last 3 Encounters:  11/14/22 122 lb 6.4 oz (55.5 kg)  09/02/22 129 lb (58.5 kg)  07/23/22 124 lb 11.2 oz (56.6 kg)  ABI screen negative indices greater than 1 both legs   Health Maintenance Due  Topic Date Due   COLON CANCER SCREENING ANNUAL FOBT  Never done   Zoster Vaccines- Shingrix (1 of 2)  Never done   DTaP/Tdap/Td (2 - Td or Tdap) 10/18/2021   COVID-19 Vaccine (3 - 2023-24 season) 05/22/2022    There are no preventive care reminders to display for this patient.  Lab Results  Component Value Date   TSH 0.675 05/20/2021   Lab Results  Component Value Date   WBC 7.3 05/20/2022   HGB 14.3 05/20/2022   HCT 43.2 05/20/2022   MCV 91.5 05/20/2022   PLT 210 05/20/2022   Lab Results  Component Value Date   NA 134 09/02/2022   K 4.4 09/02/2022   CO2 21 09/02/2022   GLUCOSE 91 09/02/2022   BUN 14 09/02/2022   CREATININE 0.66 09/02/2022   BILITOT 0.3 04/07/2021   ALKPHOS 69 03/20/2021   AST 18 04/07/2021   ALT 12 04/07/2021   ALT 12 04/07/2021   PROT 7.1 04/07/2021   ALBUMIN 4.0 03/20/2021   CALCIUM 9.4 09/02/2022   ANIONGAP 6 05/20/2022   EGFR 104 09/02/2022   Lab Results  Component Value Date   CHOL 193 03/20/2021   Lab Results  Component Value Date   HDL 51 03/20/2021   Lab Results  Component Value Date   LDLCALC 109 (H) 03/20/2021   Lab Results  Component Value Date   TRIG 193 (H) 03/20/2021   Lab Results  Component Value Date   CHOLHDL 3.8 03/20/2021   Lab Results  Component Value Date   HGBA1C 5.5 03/20/2021      Assessment & Plan:   Problem List Items Addressed This Visit   None No orders of the defined types were placed in this encounter. 35 minutes needed complex decision making multisystems assessed high risk admission Follow-up: No follow-ups on file.    Shan Levans, MD

## 2023-02-15 ENCOUNTER — Emergency Department (HOSPITAL_COMMUNITY)
Admission: EM | Admit: 2023-02-15 | Discharge: 2023-02-16 | Disposition: A | Discharge status: Home / Self Care | Payer: Medicaid Other | Attending: Emergency Medicine | Admitting: Emergency Medicine

## 2023-02-15 ENCOUNTER — Emergency Department (HOSPITAL_COMMUNITY): Payer: Medicaid Other

## 2023-02-15 ENCOUNTER — Other Ambulatory Visit: Payer: Self-pay

## 2023-02-15 ENCOUNTER — Encounter (HOSPITAL_COMMUNITY): Payer: Self-pay

## 2023-02-15 DIAGNOSIS — R079 Chest pain, unspecified: Secondary | ICD-10-CM

## 2023-02-15 DIAGNOSIS — R0789 Other chest pain: Secondary | ICD-10-CM | POA: Diagnosis not present

## 2023-02-15 DIAGNOSIS — F17200 Nicotine dependence, unspecified, uncomplicated: Secondary | ICD-10-CM | POA: Diagnosis not present

## 2023-02-15 DIAGNOSIS — K8020 Calculus of gallbladder without cholecystitis without obstruction: Secondary | ICD-10-CM | POA: Insufficient documentation

## 2023-02-15 LAB — BASIC METABOLIC PANEL
Anion gap: 12 (ref 5–15)
BUN: 25 mg/dL — ABNORMAL HIGH (ref 6–20)
CO2: 21 mmol/L — ABNORMAL LOW (ref 22–32)
Calcium: 9.2 mg/dL (ref 8.9–10.3)
Chloride: 102 mmol/L (ref 98–111)
Creatinine, Ser: 0.78 mg/dL (ref 0.44–1.00)
GFR, Estimated: >60 mL/min (ref >60)
Glucose, Bld: 103 mg/dL — ABNORMAL HIGH (ref 70–99)
Potassium: 4.1 mmol/L (ref 3.5–5.1)
Sodium: 135 mmol/L (ref 135–145)

## 2023-02-15 LAB — CBC
HCT: 41.1 % (ref 36.0–46.0)
Hemoglobin: 13.1 g/dL (ref 12.0–15.0)
MCH: 29.6 pg (ref 26.0–34.0)
MCHC: 31.9 g/dL (ref 30.0–36.0)
MCV: 93 fL (ref 80.0–100.0)
Platelets: 246 10*3/uL (ref 150–400)
RBC: 4.42 MIL/uL (ref 3.87–5.11)
RDW: 14.4 % (ref 11.5–15.5)
WBC: 9.2 10*3/uL (ref 4.0–10.5)
nRBC: 0 % (ref 0.0–0.2)

## 2023-02-15 LAB — TROPONIN I (HIGH SENSITIVITY): Troponin I (High Sensitivity): 6 ng/L (ref <18)

## 2023-02-15 MED ORDER — HYDROMORPHONE HCL 1 MG/ML IJ SOLN: 1.0000 mg | Freq: Once | INTRAMUSCULAR | Status: DC

## 2023-02-15 MED ORDER — KETOROLAC TROMETHAMINE 15 MG/ML IJ SOLN
15.0000 mg | Freq: Once | INTRAMUSCULAR | Status: AC
  Administered 2023-02-15: 15 mg via INTRAVENOUS
  Filled 2023-02-15: qty 1

## 2023-02-15 MED ORDER — GABAPENTIN 300 MG PO CAPS
300.0000 mg | ORAL_CAPSULE | Freq: Once | ORAL | Status: AC
  Administered 2023-02-15: 300 mg via ORAL
  Filled 2023-02-15: qty 1

## 2023-02-15 MED ORDER — CLONIDINE HCL 0.2 MG PO TABS
0.2000 mg | ORAL_TABLET | Freq: Once | ORAL | Status: AC
  Administered 2023-02-15: 0.2 mg via ORAL
  Filled 2023-02-15: qty 1

## 2023-02-15 MED ORDER — IOHEXOL 350 MG/ML SOLN
75.0000 mL | Freq: Once | INTRAVENOUS | Status: AC | PRN
  Administered 2023-02-15: 75 mL via INTRAVENOUS

## 2023-02-15 NOTE — ED Triage Notes (Signed)
[facility] c/o right sided chest pain radiating down right arm that started yesterday morning. Hx of HTN no other cardiac hx

## 2023-02-15 NOTE — ED Provider Notes (Signed)
[place] EMERGENCY DEPARTMENT AT [facility] Provider Note   CSN: [id number] Arrival date & time: 02/15/23  2006     History  Chief Complaint  Patient presents with   Chest Pain    Maylen Hamman is a 57 y.o. female with past medical history significant for tobacco abuse, polysubstance abuse, GERD, bipolar disorder, PTSD who presents with concern for right-sided chest pain radiating down right arm that started yesterday morning.  Patient reports that she has been taking her home blood pressure medication but did miss her nighttime dose of clonidine.   Chest Pain      Home Medications Prior to Admission medications   Medication Sig Start Date End Date Taking? Authorizing Provider  amLODipine (NORVASC) 10 MG tablet Take 1 tablet (10 mg total) by mouth daily. 11/14/22   Eleonore Chiquito, FNP  Capsaicin 0.035 % CREA Apply to affected areas on skin twice daily Patient not taking: Reported on 06/03/2022 01/20/22   [name], MD  carvedilol (COREG) 25 MG tablet Take 1 tablet (25 mg total) by mouth 2 (two) times daily with a meal. 11/14/22   Eleonore Chiquito, FNP  cloNIDine (CATAPRES) 0.2 MG tablet Take 1 tablet (0.2 mg total) by mouth 2 (two) times daily. 11/14/22   Eleonore Chiquito, FNP  divalproex (DEPAKOTE ER) 500 MG 24 hr tablet Take 1 tablet (500 mg total) by mouth every evening. 11/14/22   Eleonore Chiquito, FNP  gabapentin (NEURONTIN) 300 MG capsule Take 1 capsule (300 mg total) by mouth 4 (four) times daily as needed. 01/14/23   [name], MD  methadone (DOLOPHINE) 10 MG/ML solution Take 95 mg by mouth daily.    [provider]  mirtazapine (REMERON) 15 MG tablet Take 1 tablet (15 mg total) by mouth at bedtime. 04/02/22   [name], MD  naloxone [facility]) nasal spray 4 mg/0.1 mL Use if needed for overdose 04/02/22   [name], MD  nicotine polacrilex (NICORETTE MINI) 4 MG lozenge Use three times a day for smoking cessation 01/20/22    [name], MD  permethrin (ELIMITE) 5 % cream Apply 1 application topically onto affected area(s) once for one dose as directed. 01/04/23   Mayers, Cari S, PA-C  spironolactone (ALDACTONE) 50 MG tablet Take 1 tablet (50 mg total) by mouth daily. 11/14/22   Eleonore Chiquito, FNP  valsartan-hydrochlorothiazide (DIOVAN-HCT) 320-25 MG tablet Take 1 tablet by mouth daily. 11/14/22   Eleonore Chiquito, FNP      Allergies    Patient has no known allergies.    Review of Systems   Review of Systems  Cardiovascular:  Positive for chest pain.  Musculoskeletal:  Positive for arthralgias and neck pain.  All other systems reviewed and are negative.   Physical Exam Updated Vital Signs BP (!) 150/104   Pulse (!) 51   Temp 99.1 F (37.3 C) (Oral)   Resp 12   Ht 5\' 3"  (1.6 m)   Wt 56.7 kg   LMP 12/06/2015   SpO2 98%   BMI 22.14 kg/m  Physical Exam Vitals and nursing note reviewed.  Constitutional:      General: She is not in acute distress.    Appearance: Normal appearance.  HENT:     Head: Normocephalic and atraumatic.  Eyes:     General:        Right eye: No discharge.        Left eye: No discharge.  Cardiovascular:     Rate and  Rhythm: Normal rate and regular rhythm.     Heart sounds: No murmur heard.    No friction rub. No gallop.  Pulmonary:     Effort: Pulmonary effort is normal.     Breath sounds: Normal breath sounds.  Chest:     Comments: With some tenderness of right-sided chest wall, right shoulder, but intact strength 5/5 of the right upper extremity to flexion, extension, internal, external rotation, abduction and adduction. Abdominal:     General: Bowel sounds are normal.     Palpations: Abdomen is soft.  Skin:    General: Skin is warm and dry.     Capillary Refill: Capillary refill takes less than 2 seconds.  Neurological:     Mental Status: She is alert and oriented to person, place, and time.  Psychiatric:        Mood and Affect: Mood normal.         Behavior: Behavior normal.     ED Results / Procedures / Treatments   Labs (all labs ordered are listed, but only abnormal results are displayed) Labs Reviewed  BASIC METABOLIC PANEL - Abnormal; Notable for the following components:      Result Value   CO2 21 (*)    Glucose, Bld 103 (*)    BUN 25 (*)    All other components within normal limits  CBC  HEPATIC FUNCTION PANEL  TROPONIN I (HIGH SENSITIVITY)  TROPONIN I (HIGH SENSITIVITY)    EKG None  Radiology CT Chest W Contrast  Result Date: 02/15/2023 CLINICAL DATA:  Nontraumatic chest wall pain, infection or inflammation suspected. Chest x-ray was done showing possible pneumomediastinum versus distended thoracic esophagus. EXAM: CT CHEST WITH CONTRAST TECHNIQUE: Multidetector CT imaging of the chest was performed during intravenous contrast administration. RADIATION DOSE REDUCTION: This exam was performed according to the departmental dose-optimization program which includes automated exposure control, adjustment of the mA and/or kV according to patient size and/or use of iterative reconstruction technique. CONTRAST:  75mL OMNIPAQUE IOHEXOL 350 MG/ML SOLN COMPARISON:  PA Lat chest today, PA Lat chest 05/20/2022. FINDINGS: Cardiovascular: The heart is slightly enlarged. The coronary arteries are heavily calcified particularly for age. No pericardial effusion is seen. There is moderate aortic atherosclerosis, scattered plaque in the great vessels and a normal-variant brachiobicarotid trunk. The pulmonary arteries and veins are normal in caliber and the pulmonary arteries are centrally clear. There is no aortic aneurysm, dissection or stenosis. Mediastinum/Nodes: No enlarged mediastinal, hilar, or axillary lymph nodes. Thyroid gland, trachea, and esophagus demonstrate no significant findings. There is a small hiatal hernia. There is no pneumomediastinum. Thoracic esophagus is nondilated. Lungs/Pleura: Mild-to-moderate emphysematous changes are  noted with centrilobular changes predominating, mild elevated right hemidiaphragm and scattered linear scar-like opacities in both bases. The central bronchi are thickened but normal caliber without bronchial plugging. No focal pneumonia or nodules are seen. There is mild posterior atelectasis in the lower lobes. No pleural effusion, thickening or pneumothorax. Upper Abdomen: Mild hepatic steatosis. Abdominal aortic atherosclerosis. The common bile duct is dilated measuring 12 mm without visible filling defect. Laboratory and clinical correlation advised. There is mild intrahepatic biliary prominence as well. There is mild nodular thickening in both adrenal glands. Musculoskeletal: No acute or significant osseous findings. No chest wall mass is seen. Thoracic spondylosis. IMPRESSION: 1. Emphysema with thickened central bronchi but no bronchial plugging or focal pneumonia. 2. For lung cancer screening, adhere to Lung-RADS guidelines. The patient may meet the criteria for annual screening CT given the degree of  emphysema. 3. Aortic and coronary artery atherosclerosis, both age-advanced. Mild cardiomegaly. 4. Dilated common bile duct and mild intrahepatic biliary prominence. Laboratory and clinical correlation advised. The visualized portion of the gallbladder is not thickened but the gallbladder is not fully seen. 5. Mild nodular thickening in both adrenal glands. 6. Small hiatal hernia. Emphysema (ICD10-J43.9). Electronically Signed   By: Almira Bar M.D.   On: 02/15/2023 23:16   DG Chest 2 View  Result Date: 02/15/2023 CLINICAL DATA:  Chest pain. EXAM: CHEST - 2 VIEW COMPARISON:  May 20, 2022 FINDINGS: The heart size and mediastinal contours are within normal limits. Very thin linear opacities are seen overlying both sides of the superior mediastinum on the frontal view. These areas are not seen on the prior study and are not clearly identified on the lateral view. Both lungs are clear. The visualized  skeletal structures are unremarkable. IMPRESSION: Thin, linear opacities overlying the superior mediastinum, as described above. While this may represent air within a distended esophagus, further evaluation with chest CT is recommended. Electronically Signed   By: Aram Candela M.D.   On: 02/15/2023 21:33    Procedures Procedures    Medications Ordered in ED Medications  cloNIDine (CATAPRES) tablet 0.2 mg (0.2 mg Oral Given 02/15/23 2105)  ketorolac (TORADOL) 15 MG/ML injection 15 mg (15 mg Intravenous Given 02/15/23 2105)  gabapentin (NEURONTIN) capsule 300 mg (300 mg Oral Given 02/15/23 2104)  iohexol (OMNIPAQUE) 350 MG/ML injection 75 mL (75 mLs Intravenous Contrast Given 02/15/23 2250)    ED Course/ Medical Decision Making/ A&P                             Medical Decision Making Amount and/or Complexity of Data Reviewed Labs: ordered. Radiology: ordered.  Risk Prescription drug management.   This patient is a 57 y.o. female  who presents to the ED for concern of chest.   Differential diagnoses prior to evaluation: The emergent differential diagnosis includes, but is not limited to,  ACS, AAS, PE, Mallory-Weiss, Boerhaave's, Pneumonia, acute bronchitis, asthma or COPD exacerbation, anxiety, MSK pain or traumatic injury to the chest, acid reflux versus other . This is not an exhaustive differential.   Past Medical History / Co-morbidities / Social History:  tobacco abuse, polysubstance abuse, GERD, bipolar disorder, PTSD   Additional history: Chart reviewed. Pertinent results include: Previous echo in 2019 with normal left ventricular ejection fraction around 65 to 70%  Physical Exam: Physical exam performed. The pertinent findings include: With some tenderness of right-sided chest wall, right shoulder, but intact strength 5/5 of the right upper extremity to flexion, extension, internal, external rotation, abduction and adduction.  Lab Tests/Imaging studies: I personally  interpreted labs/imaging and the pertinent results include: BMP overall unremarkable, mildly decreased CO2 21, CBC within normal range, troponin negative x 1..  I have interpreted plain, chest x-ray, CT chest with contrast, which showed, questionable linear streaking in the chest x-ray concerning for dilated esophagus, they recommended a CT chest for further evaluation, CT chest showed some gallbladder wall thickening, on repeat exam patient with some focal right upper quadrant tenderness, increasing my suspicion that she is having some referred right upper quadrant pain to the shoulder, chest wall.  I agree with the radiologist interpretation.  Right upper quadrant ultrasound pending at time of handoff.  Cardiac monitoring: EKG obtained and interpreted by my attending physician which shows: Normal sinus rhythm.   Medications: I ordered medication including clonidine  for blood pressure, Toradol, gabapentin for pain..  I have reviewed the patients home medicines and have made adjustments as needed.   12:02 AM Care of  [name] transferred to [facility] and Dr. Blinda Leatherwood at the end of my shift as the patient will require reassessment once labs/imaging have resulted. Patient presentation, ED course, and plan of care discussed with review of all pertinent labs and imaging. Please see his/her note for further details regarding further ED course and disposition. Plan at time of handoff is pending reevaluation after right upper quadrant ultrasound, hepatic function panel, may need to consult surgery if she has any acute gallbladder pathology, otherwise recommend symptomatic control, and follow-up with GI .Marland Kitchen This may be altered or completely changed at the discretion of the oncoming team pending results of further workup.   Final Clinical Impression(s) / ED Diagnoses Final diagnoses:  None    Rx / DC Orders ED Discharge Orders     None         [place] 02/16/23 Loman Chroman, MD 02/16/23 1454

## 2023-02-16 ENCOUNTER — Encounter: Payer: Self-pay | Admitting: Critical Care Medicine

## 2023-02-16 ENCOUNTER — Ambulatory Visit: Payer: Medicaid Other | Attending: Critical Care Medicine | Admitting: Critical Care Medicine

## 2023-02-16 ENCOUNTER — Other Ambulatory Visit: Payer: Self-pay

## 2023-02-16 VITALS — BP 134/87 | HR 54 | Wt 118.4 lb

## 2023-02-16 DIAGNOSIS — M5442 Lumbago with sciatica, left side: Secondary | ICD-10-CM

## 2023-02-16 DIAGNOSIS — G8929 Other chronic pain: Secondary | ICD-10-CM | POA: Insufficient documentation

## 2023-02-16 DIAGNOSIS — T466X5A Adverse effect of antihyperlipidemic and antiarteriosclerotic drugs, initial encounter: Secondary | ICD-10-CM

## 2023-02-16 DIAGNOSIS — K8020 Calculus of gallbladder without cholecystitis without obstruction: Secondary | ICD-10-CM | POA: Diagnosis not present

## 2023-02-16 DIAGNOSIS — R072 Precordial pain: Secondary | ICD-10-CM | POA: Insufficient documentation

## 2023-02-16 DIAGNOSIS — R21 Rash and other nonspecific skin eruption: Secondary | ICD-10-CM | POA: Diagnosis not present

## 2023-02-16 DIAGNOSIS — I10 Essential (primary) hypertension: Secondary | ICD-10-CM | POA: Insufficient documentation

## 2023-02-16 DIAGNOSIS — L308 Other specified dermatitis: Secondary | ICD-10-CM | POA: Diagnosis not present

## 2023-02-16 DIAGNOSIS — F17210 Nicotine dependence, cigarettes, uncomplicated: Secondary | ICD-10-CM | POA: Insufficient documentation

## 2023-02-16 DIAGNOSIS — M791 Myalgia, unspecified site: Secondary | ICD-10-CM

## 2023-02-16 DIAGNOSIS — M25552 Pain in left hip: Secondary | ICD-10-CM | POA: Diagnosis not present

## 2023-02-16 DIAGNOSIS — Z720 Tobacco use: Secondary | ICD-10-CM

## 2023-02-16 LAB — HEPATIC FUNCTION PANEL
ALT: 16 U/L (ref 0–44)
AST: 19 U/L (ref 15–41)
Albumin: 3.1 g/dL — ABNORMAL LOW (ref 3.5–5.0)
Alkaline Phosphatase: 62 U/L (ref 38–126)
Bilirubin, Direct: <0.1 mg/dL (ref 0.0–0.2)
Total Bilirubin: 0.3 mg/dL (ref 0.3–1.2)
Total Protein: 6.5 g/dL (ref 6.5–8.1)

## 2023-02-16 LAB — TROPONIN I (HIGH SENSITIVITY): Troponin I (High Sensitivity): 5 ng/L (ref <18)

## 2023-02-16 MED ORDER — CYCLOBENZAPRINE HCL 10 MG PO TABS
10.0000 mg | ORAL_TABLET | Freq: Two times a day (BID) | ORAL | 0 refills | Status: DC | PRN
  Filled 2023-02-16: qty 20, 10d supply, fill #0

## 2023-02-16 MED ORDER — DIAZEPAM 5 MG/ML IJ SOLN
2.5000 mg | Freq: Once | INTRAMUSCULAR | Status: AC
  Administered 2023-02-16: 2.5 mg via INTRAVENOUS
  Filled 2023-02-16: qty 2

## 2023-02-16 NOTE — Assessment & Plan Note (Signed)
Related to lumbar back pain x-rays of hip previously were normal except for mild degeneration

## 2023-02-16 NOTE — Assessment & Plan Note (Signed)
patient does have right upper quadrant abdominal pain with cholelithiasis will refer to general surgery

## 2023-02-16 NOTE — Patient Instructions (Signed)
Multiple referrals sent  No change in medications except increase dose gabapentin  Continue to reduce tobacco  Return Dr Delford Field 5 months

## 2023-02-16 NOTE — Assessment & Plan Note (Signed)
Due to elevated blood pressure and chest pain will make referral to cardiology

## 2023-02-16 NOTE — Assessment & Plan Note (Signed)
Persistent pruritic dermatitis will make referral to dermatology

## 2023-02-16 NOTE — Assessment & Plan Note (Signed)
Plan to increase dose of gabapentin and make referral to orthopedics

## 2023-02-16 NOTE — Assessment & Plan Note (Signed)
  .   Current smoking consumption amount: 1/2 pack a day  . Dicsussion on advise to quit smoking and smoking impacts: Cardiovascular impacts  . Patient's willingness to quit: Uncertain  . Methods to quit smoking discussed: Behavioral modification nicotine replacement  . Medication management of smoking session drugs discussed: Nicotine replacement  . Resources provided:  AVS   . Setting quit date not established  . Follow-up arranged 4 week   Time spent counseling the patient: 5 minutes  

## 2023-02-16 NOTE — ED Provider Notes (Signed)
Patient was received at shift change from Luther Hearing PA-C please see note for full detail  In Short patient with medical history including polysubstance use, bipolar, hypertension, current smoker presenting with complaints of right-sided chest pain started yesterday, patient states she woke up with it, states pain is worse with right arm movement, improved with rest but does not fully go away, states  she does have some slight pain with deep inspiration denies pleuritic chest pain, no cough, congestion, no fevers or chills general body aches, denies any trauma to the area.  She has no history of PEs or DVTs no recent surgeries no long immobilization.  She states that she has no associated stomach pains nausea vomiting diarrhea.  Per previous provider follow-up on ultrasound and discharge accordingly.  Physical Exam  BP 117/77   Pulse (!) 51   Temp 99.1 F (37.3 C) (Oral)   Resp 18   Ht 5\' 3"  (1.6 m)   Wt 56.7 kg   LMP 12/06/2015   SpO2 97%   BMI 22.14 kg/m   Physical Exam Vitals and nursing note reviewed.  Constitutional:      General: She is not in acute distress.    Appearance: She is not ill-appearing.  HENT:     Head: Normocephalic and atraumatic.     Nose: No congestion.  Eyes:     Conjunctiva/sclera: Conjunctivae normal.  Cardiovascular:     Rate and Rhythm: Normal rate and regular rhythm.     Pulses: Normal pulses.     Heart sounds: No murmur heard.    No friction rub. No gallop.  Pulmonary:     Effort: No respiratory distress.     Breath sounds: No wheezing, rhonchi or rales.     Comments: No gross deformity of the chest presents, lung sounds clear bilaterally, patient has right-sided focalized and reproducible chest pain on the lateral aspect of the pectoralis muscle. Abdominal:     Palpations: Abdomen is soft.     Tenderness: There is abdominal tenderness. There is no right CVA tenderness or left CVA tenderness.     Comments: Abdomen nondistended, soft,  minimal tenderness in the right upper quadrant, no guarding rebound has or peritoneal sign.  Musculoskeletal:     Right lower leg: No edema.     Left lower leg: No edema.     Comments: No unilateral leg swelling no calf tenderness no palpable cords.  Skin:    General: Skin is warm and dry.  Neurological:     Mental Status: She is alert.  Psychiatric:        Mood and Affect: Mood normal.     Procedures  Procedures  ED Course / MDM    Medical Decision Making Amount and/or Complexity of Data Reviewed Labs: ordered. Radiology: ordered.  Risk Prescription drug management.    I Ordered, and personally interpreted labs.  The pertinent results include: CBC is unremarkable, BMP reveals CO2 of 21 glucose 103 BUN 25 hepatic panel 3.1, negative delta troponin   Imaging Studies ordered:  I ordered imaging studies including chest x-ray, CT chest with contrast, limited ultrasound I independently visualized and interpreted imaging which showed chest x-ray reveals opacity over the line in the superior mediastinum.  CT scan reveals emphysema without pneumonia, there is noted dilation of the common bile duct.  Ultrasound reveals cholelithiasis without acute cholecystitis mild dilation of the common bile duct. I agree with the radiologist interpretation   Cardiac Monitoring:  The patient was maintained on a  cardiac monitor.  I personally viewed and interpreted the cardiac monitored which showed an underlying rhythm of: Sinus not signs of ischemia   Medicines ordered and prescription drug management:  I ordered medication including Flexeril I have reviewed the patients home medicines and have made adjustments as needed  Critical Interventions:  N/A   Reevaluation:  Patient's ultrasound reveals gallbladder stone with very minimal ductal dilation, her exam is unremarkable very minimal tenderness in the right upper quadrant mainly her pain is her right upper chest, will consult with  general surgery for further recommendations  I notified patient of recommendations and she is in agreement with this and is ready for discharge.  Consultations Obtained:  I requested consultation with the Dr. Janee Morn,  and discussed lab and imaging findings as well as pertinent plan - they recommend: As long as patient is asymptomatic, no elevation in liver enzymes, patient can be discharged home with outpatient follow-up    Test Considered:  N/a    Rule out I have low suspicion for ACS as history is atypical, patient has no cardiac history, EKG was sinus rhythm without signs of ischemia, patient had negative delta troponin.  Low suspicion for PE as patient denies pleuritic chest pain, shortness of breath, patient denies leg pain, no pedal edema noted on exam, vital signs reassuring nontachypneic nonhypoxic.  Low suspicion for AAA or aortic dissection as history is atypical, patient has low risk factors.  Suspicion for cholecystitis cholangitis is low she is afebrile nontachycardic no leukocytosis there is no elevation liver enzymes, she is very minimal pain in the right upper quadrant presentation is atypical etiology.   Dispostion and problem list  After consideration of the diagnostic results and the patients response to treatment, I feel that the patent would benefit from discharge.  Right-sided chest pain-likely muscular will provide muscle relaxer follow-up with PCP for further assessment Cholelithiasis-will have patient follow-up with general surgery for further evaluation           Carroll Sage, PA-C 02/16/23 0135    Gilda Crease, MD 02/16/23 [phone]

## 2023-02-16 NOTE — Discharge Instructions (Signed)
Chest pain-likely you strained the muscles in your chest, recommend over-the-counter pain medications, give you medication called Flexeril take as prescribed.  Do not consume alcohol or operate heavy she will take this medication as it can make you drowsy follow-up with your primary care doctor Cholelithiasis-you have a noted stone in your gallbladder, I would like you to follow-up with general surgery for further evaluation.  Come back to the emergency department if you develop chest pain, shortness of breath, severe abdominal pain, uncontrolled nausea, vomiting, diarrhea.

## 2023-02-16 NOTE — Assessment & Plan Note (Signed)
Hypertension this but improved at this time we will continue current medications refills given

## 2023-02-23 ENCOUNTER — Other Ambulatory Visit: Payer: Self-pay

## 2023-02-26 ENCOUNTER — Ambulatory Visit: Payer: Medicaid Other

## 2023-03-14 ENCOUNTER — Ambulatory Visit (HOSPITAL_COMMUNITY)
Admission: EM | Admit: 2023-03-14 | Discharge: 2023-03-14 | Disposition: A | Discharge status: Home / Self Care | Payer: Medicaid Other | Attending: Emergency Medicine | Admitting: Emergency Medicine

## 2023-03-14 ENCOUNTER — Encounter (HOSPITAL_COMMUNITY): Payer: Self-pay | Admitting: *Deleted

## 2023-03-14 DIAGNOSIS — B86 Scabies: Secondary | ICD-10-CM

## 2023-03-14 DIAGNOSIS — R197 Diarrhea, unspecified: Secondary | ICD-10-CM

## 2023-03-14 MED ORDER — IVERMECTIN 3 MG PO TABS: ORAL_TABLET | ORAL | 0 refills | Status: DC

## 2023-03-14 MED ORDER — LOPERAMIDE HCL 2 MG PO CAPS: 2.0000 mg | ORAL_CAPSULE | Freq: Four times a day (QID) | ORAL | 0 refills | Status: DC | PRN

## 2023-03-14 MED ORDER — PERMETHRIN 5 % EX CREA: TOPICAL_CREAM | CUTANEOUS | 1 refills | Status: DC

## 2023-03-14 NOTE — ED Triage Notes (Signed)
Pt states that she has a rash all over x 2 weeks, She states that she was exposed to scabies and needs treatment. She states she is using OTC itch cream without relief.   She also complains of diarrhea X 2 days. She states she has hx of gall stones and isnt sure if that might cause diarrhea.   She took her meds today and she took her clonidine about an hour ago.

## 2023-03-14 NOTE — Discharge Instructions (Signed)
Today you are being treated for scabies  Take all 3 tablets of ivermectin at once  Apply permethrin cream from the neck below once then may repeat in 7 days if scabies are still present  May follow-up with his urgent care as needed  Unknown cause for diarrhea, no tenderness on her abdominal exam low suspicion for more serious organ involvement  Until diarrhea has resolved increase her fluid intake to maintain her hydration  May use Imodium every 6 hours as needed to help slow symptoms  May follow-up with urgent care as needed

## 2023-03-14 NOTE — ED Provider Notes (Signed)
[facility]    CSN: [id number] Arrival date & time: 03/14/23  1314      History   Chief Complaint Chief Complaint  Patient presents with   Rash   Diarrhea    HPI Ariana Jensen is a 57 y.o. female.   Patient presents for evaluation of a erythematous appearing rash present to the bilateral upper and lower extremities, between the toes and underneath axilla present for 2 weeks.  Known exposure to scabies and endorses similar rash in the past.  Has attempted use of over-the-counter topical medications for pruritus which have been ineffective.  Denies changes in toiletries diet or recent travel.  Patient concerned with bright green watery diarrhea present for 2 to 3 days.  Only occurring after meals.  Has been able to tolerate food and liquids.  No known sick contacts.  Denies fevers, URI symptoms, abdominal pain, nausea or vomiting, abdominal bloating, heartburn or indigestion.  History of gallstones.    Past Medical History:  Diagnosis Date   Anxiety    ASCUS (atypical squamous cells of undetermined significance) on Pap smear    Body lice 08/05/2015   Chronic lower back pain    Cocaine abuse (HCC)    crack cocaine   Depression    Fibromyalgia    GERD (gastroesophageal reflux disease)    Hepatitis C    Hepatitis C antibody test positive    Hypertension    Hypokalemia 03/04/2018   Leukocytosis 03/04/2018   Opioid dependence (HCC)    PVC's (premature ventricular contractions) 03/04/2018   Scabies 05/13/2021   Seizures (HCC)    "don't know why; I've had 2 in the past year" (08/18/2016)   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   Trichomonas 04/17/2011   Diagnosed 04/02/11 during hospitalization, treated with Flagyl 2g, patient instructed to have partner treated (must follow-up)    Tuberculosis    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 age 22, now s/p 1-yr of multidrug treatment (dates unknown)    Patient Active Problem List    Diagnosis Date Noted   Myalgia due to statin 02/16/2023   Chronic bilateral low back pain with left-sided sciatica 02/16/2023   Calculus of gallbladder without cholecystitis without obstruction 02/16/2023   Precordial pain 02/16/2023   HTN (hypertension) 05/20/2021   Left hip pain 03/20/2021   MDD (major depressive disorder), recurrent episode, severe (HCC) 07/14/2018   PTSD (post-traumatic stress disorder) 06/22/2018   Bipolar I disorder, most recent episode depressed, severe without psychotic features (HCC) 06/21/2018   PVC's (premature ventricular contractions) 03/04/2018   Pruritic dermatitis 09/10/2015   Dysmenorrhea 04/10/2015   Colon cancer screening 03/07/2015   GERD (gastroesophageal reflux disease) 01/28/2012   Menorrhagia 01/25/2012   Tobacco abuse 11/06/2011   History of TB (tuberculosis) 10/22/2011   Polysubstance abuse (HCC) 04/17/2011    Past Surgical History:  Procedure Laterality Date   CESAREAN SECTION  1984   DILATION AND CURETTAGE OF UTERUS     I & D EXTREMITY  10/18/2011   Procedure: IRRIGATION AND DEBRIDEMENT EXTREMITY;  Surgeon: Sharma Covert, MD;  Location: [facility];  Service: Orthopedics;  Laterality: Left;   I & D EXTREMITY  10/21/2011   Procedure: IRRIGATION AND DEBRIDEMENT EXTREMITY;  Surgeon: Sharma Covert, MD;  Location: [facility];  Service: Orthopedics;  Laterality: Left;   SALPINGECTOMY Left March 2010   ectopic pregnancy    OB History   No obstetric history on file.      Home Medications  Prior to Admission medications   Medication Sig Start Date End Date Taking? Authorizing Provider  amLODipine (NORVASC) 10 MG tablet Take 1 tablet (10 mg total) by mouth daily. 11/14/22  Yes Eleonore Chiquito, FNP  carvedilol (COREG) 25 MG tablet Take 1 tablet (25 mg total) by mouth 2 (two) times daily with a meal. 11/14/22  Yes Eleonore Chiquito, FNP  cloNIDine (CATAPRES) 0.2 MG tablet Take 1 tablet (0.2 mg total) by mouth 2 (two) times daily. 11/14/22  Yes  Eleonore Chiquito, FNP  divalproex (DEPAKOTE ER) 500 MG 24 hr tablet Take 1 tablet (500 mg total) by mouth every evening. 11/14/22  Yes Eleonore Chiquito, FNP  gabapentin (NEURONTIN) 300 MG capsule Take 1 capsule (300 mg total) by mouth 4 (four) times daily as needed. 01/14/23  Yes [name], MD  methadone (DOLOPHINE) 10 MG/ML solution Take 95 mg by mouth daily.   Yes [provider]  Capsaicin 0.035 % CREA Apply to affected areas on skin twice daily 01/20/22   [name], MD  cyclobenzaprine (FLEXERIL) 10 MG tablet Take 1 tablet (10 mg total) by mouth 2 (two) times daily as needed for muscle spasms. 02/16/23   Carroll Sage, PA-C  mirtazapine (REMERON) 15 MG tablet Take 1 tablet (15 mg total) by mouth at bedtime. 04/02/22   [name], MD  naloxone [facility]) nasal spray 4 mg/0.1 mL Use if needed for overdose 04/02/22   [name], MD  nicotine polacrilex (NICORETTE MINI) 4 MG lozenge Use three times a day for smoking cessation 01/20/22   [name], MD  permethrin (ELIMITE) 5 % cream Apply 1 application topically onto affected area(s) once for one dose as directed. 01/04/23   Mayers, Cari S, PA-C  spironolactone (ALDACTONE) 50 MG tablet Take 1 tablet (50 mg total) by mouth daily. 11/14/22   Eleonore Chiquito, FNP  valsartan-hydrochlorothiazide (DIOVAN-HCT) 320-25 MG tablet Take 1 tablet by mouth daily. 11/14/22   Eleonore Chiquito, FNP    Family History Family History  Problem Relation Age of Onset   Heart attack Mother    Heart attack Father    Heart attack Paternal Grandmother    Heart attack Paternal Grandfather    Cirrhosis Brother    Breast cancer Neg Hx     Social History Social History   Tobacco Use   Smoking status: Every Day    Packs/day: 0.50    Years: 32.00    Additional pack years: 0.00    Total pack years: 16.00    Types: Cigarettes   Smokeless tobacco: Never  Vaping Use   Vaping Use: Never used  Substance Use Topics   Alcohol  use: Not Currently    Comment: occ   Drug use: Not Currently    Types: Oxycodone, Heroin, Cocaine, IV    Comment: Pt stated "I do heroin"... not used in 2 weeks per pt 03/14/2023     Allergies   Statins   Review of Systems Review of Systems  Gastrointestinal:  Positive for diarrhea.  Skin:  Positive for rash.     Physical Exam Triage Vital Signs ED Triage Vitals  Enc Vitals Group     BP 03/14/23 1411 (!) 209/101     Pulse Rate 03/14/23 1411 (!) 44     Resp 03/14/23 1411 18     Temp 03/14/23 1411 97.9 F (36.6 C)     Temp Source 03/14/23 1411 Oral     SpO2 03/14/23 1411 98 %  Weight --      Height --      Head Circumference --      Peak Flow --      Pain Score 03/14/23 1408 0     Pain Loc --      Pain Edu? --      Excl. in GC? --    No data found.  Updated Vital Signs BP (!) 209/101 (BP Location: Right Arm)   Pulse (!) 44   Temp 97.9 F (36.6 C) (Oral)   Resp 18   LMP 12/06/2015   SpO2 98%   Visual Acuity Right Eye Distance:   Left Eye Distance:   Bilateral Distance:    Right Eye Near:   Left Eye Near:    Bilateral Near:     Physical Exam Constitutional:      Appearance: Normal appearance.  Eyes:     Extraocular Movements: Extraocular movements intact.  Pulmonary:     Effort: Pulmonary effort is normal.  Abdominal:     General: Abdomen is flat. Bowel sounds are normal. There is no distension.     Palpations: Abdomen is soft.     Tenderness: There is no abdominal tenderness. There is no right CVA tenderness, left CVA tenderness or guarding.  Skin:    Comments: Erythematous papules present over the bilateral upper and lower extremities, less than 0.5 cm black dots within the webbing of the toes  Neurological:     Mental Status: She is alert and oriented to person, place, and time. Mental status is at baseline.      UC Treatments / Results  Labs (all labs ordered are listed, but only abnormal results are displayed) Labs Reviewed - No data  to display  EKG   Radiology No results found.  Procedures Procedures (including critical care time)  Medications Ordered in UC Medications - No data to display  Initial Impression / Assessment and Plan / UC Course  I have reviewed the triage vital signs and the nursing notes.  Pertinent labs & imaging results that were available during my care of the patient were reviewed by me and considered in my medical decision making (see chart for details).  Scabies, diarrhea  Presentation of rash consistent with scabies, prescribed permethrin and oral ivermectin, discussed administration, advised follow-up if symptoms continue to persist or worsen  Unknown etiology of diarrhea, no abdominal pain noted on exam, low suspicion for more serious organ involvement, advised increase fluid intake, monitor use of prescribed Imodium for management, may follow-up with her primary doctor for any further concerns Final Clinical Impressions(s) / UC Diagnoses   Final diagnoses:  None   Discharge Instructions   None    ED Prescriptions   None    PDMP not reviewed this encounter.   Valinda Hoar, NP 03/14/23 1538

## 2023-03-18 ENCOUNTER — Other Ambulatory Visit: Payer: Self-pay | Admitting: Critical Care Medicine

## 2023-03-18 DIAGNOSIS — M25552 Pain in left hip: Secondary | ICD-10-CM

## 2023-03-18 NOTE — Telephone Encounter (Signed)
Medication Refill - Medication: gabapentin (NEURONTIN) 300 MG capsule   Has the patient contacted their pharmacy? yes (Agent: If no, request that the patient contact the pharmacy for the refill. If patient does not wish to contact the pharmacy document the reason why and proceed with request.) (Agent: If yes, when and what did the pharmacy advise?)contact pcp  Preferred Pharmacy (with phone number or street name):  [id number] - , [place] - [address] AT [facility] OF [facility] [address] Phone: [phone]  Fax: [phone]     Has the patient been seen for an appointment in the last year OR does the patient have an upcoming appointment? yes  Agent: Please be advised that RX refills may take up to 3 business days. We ask that you follow-up with your pharmacy.

## 2023-03-19 NOTE — Telephone Encounter (Signed)
Unable to refill per protocol, Rx request was refilled 03/19/23.E-Prescribing Status: Receipt confirmed by pharmacy (03/18/2023  8:56 PM EDT).  Requested Prescriptions  Pending Prescriptions Disp Refills   gabapentin (NEURONTIN) 300 MG capsule 120 capsule 1    Sig: Take 1 capsule (300 mg total) by mouth 4 (four) times daily as needed.     Neurology: Anticonvulsants - gabapentin Passed - 03/18/2023  9:43 AM      Passed - Cr in normal range and within 360 days    Creat  Date Value Ref Range Status  08/21/2015 0.85 0.50 - 1.10 mg/dL Final   Creatinine, Ser  Date Value Ref Range Status  02/15/2023 0.78 0.44 - 1.00 mg/dL Final   Creatinine,U  Date Value Ref Range Status  08/21/2015 122.71 mg/dL Final    Comment:       Cutoff Values for Urine Drug Screen, Pain Mgmt          Drug Class           Cutoff (ng/mL)          Amphetamines             500          Barbiturates             200          Cocaine Metabolites      150          Benzodiazepines          200          Methadone                300          Opiates                  300          Phencyclidine             25          Propoxyphene             300          Marijuana Metabolites     50    For medical purposes only.    Creatinine, Urine  Date Value Ref Range Status  02/10/2018 129.97 mg/dL Final    Comment:    Performed at [facility], [address] [place], [place] [address]         Passed - Completed PHQ-2 or PHQ-9 in the last 360 days      Passed - Valid encounter within last 12 months    Recent Outpatient Visits           1 month ago Precordial pain   [facility] [name], MD   4 months ago Hypertension, unspecified type   [facility], FNP   6 months ago Hypertensive urgency   [facility] [name], MD   9 months ago Hypertensive urgency   [facility] [name], MD   11 months ago Hypertensive urgency   [facility] [name], MD       Future Appointments             In 3 weeks Branch, Alben Spittle, MD [facility]  In 4 months Delford Field Charlcie Cradle, MD [facility]

## 2023-04-15 ENCOUNTER — Ambulatory Visit: Payer: MEDICAID | Admitting: Internal Medicine

## 2023-04-27 ENCOUNTER — Ambulatory Visit: Payer: Self-pay | Admitting: *Deleted

## 2023-04-27 NOTE — Telephone Encounter (Signed)
  Chief Complaint: Rash all over her body that has been there for years is itching.   Unable to get in contact with the dermatologist at [facility] that Dr. Delford Field referred her to.   "No one ever calls me back". Right hand is getting weak, painful and tingling Symptoms: Rash that is itching all over her body.   Looks like blisters that are red with a clear covering. Right hand is weak and painful.   I can't even open a door with it.   Frequency: Rash for years;    Right hand for several weeks and getting weaker. Pertinent Negatives: Patient denies knowing what is causing the rash.   Disposition: [] ED /[] Urgent Care (no appt availability in office) / [x] Appointment(In office/virtual)/ []  [facility]/ [] Home Care/ [] Refused Recommended Disposition /[] [facility]/ []  Follow-up with PCP Additional Notes: Appt made with Georgian Co, PA-C for 05/06/2023 at 3:50.

## 2023-04-27 NOTE — Telephone Encounter (Signed)
Message from [place] M sent at 04/27/2023  1:07 PM EDT  Summary: Rash going on for a while/right hand no grip on it,some tingling,no numbing   Pt stated Dr.Wright saw her for a rash back in May and it is getting worse. Stated this is this may be affecting her; her right hand has no grip on it, some tingling, no numbing, and pain is 6 out of 10.  She stated she has reached out to [facility] but has been unable to speak with them or get a callback.  Seeking clinical advice.          Call History  Contact Date/Time Type Contact Phone/Fax User  04/27/2023 01:05 PM EDT Phone (Incoming) Meech, [place] (Self) [phone] (H) McGill, Alondra   Reason for Disposition  Mild widespread rash  (Exception: Heat rash lasting 3 days or less.)  Answer Assessment - Initial Assessment Questions 1. APPEARANCE of RASH: "Describe the rash." (e.g., spots, blisters, raised areas, skin peeling, scaly)    I have this rash all over my body  that itches and is not getting better.   Dr. Delford Field has seen it.   He referred me to a dermatologist but they never called me back.   I left a message a few minutes with them.   They never call back. My right hand is weak and getting weaker.   2. SIZE: "How big are the spots?" (e.g., tip of pen, eraser, coin; inches, centimeters)     blisters 3. LOCATION: "Where is the rash located?"     All over my body 4. COLOR: "What color is the rash?" (Note: It is difficult to assess rash color in people with darker-colored skin. When this situation occurs, simply ask the caller to describe what they see.)     They are red with clear cover over them.  No drainage 5. ONSET: "When did the rash begin?"     For several years now.   It comes and goes.   6. FEVER: "Do you have a fever?" If Yes, ask: "What is your temperature, how was it measured, and when did it start?"     Not asked 7. ITCHING: "Does the rash itch?" If Yes, ask: "How bad is the itch?" (Scale  1-10; or mild, moderate, severe)     Yes 8. CAUSE: "What do you think is causing the rash?"     I don't know 9. MEDICINE FACTORS: "Have you started any new medicines within the last 2 weeks?" (e.g., antibiotics)      I've had this for years.   10. OTHER SYMPTOMS: "Do you have any other symptoms?" (e.g., dizziness, headache, sore throat, joint pain)       My right hand is weak and tingling.   It hurts if I try to squeeze it.   It's getting worse pain.   I can't even open a door with it.    I'm right handed. 11. PREGNANCY: "Is there any chance you are pregnant?" "When was your last menstrual period?"       Not asked due to age  Protocols used: Rash or Redness - [facility]

## 2023-05-06 ENCOUNTER — Ambulatory Visit: Payer: MEDICAID | Admitting: Physician Assistant

## 2023-05-07 LAB — AMB RESULTS CONSOLE CBG: Glucose: 103

## 2023-05-12 ENCOUNTER — Encounter: Payer: Self-pay | Admitting: *Deleted

## 2023-05-12 ENCOUNTER — Other Ambulatory Visit: Payer: Self-pay | Admitting: Critical Care Medicine

## 2023-05-12 DIAGNOSIS — M25552 Pain in left hip: Secondary | ICD-10-CM

## 2023-05-12 NOTE — Progress Notes (Unsigned)
Pt attended 05/07/23 screening event where her b/p was 173/104 and her blood sugar was 103. At the event, the pt noted Dr. Shan Levans as here PCP and indicated she had food, housing, transportation, utilities insecurities for which she was given resources at the event. Chart review confirms pt's current PCP is Dr. Delford Field, with whom pt had her most recent appt on 02/16/23, when her b/p was 134/87. Since that time, pt had cancelled appt with cardiology and no-showed a PCP team provider appt on 05/06/23. Calls made to f/u with pt about b/p with PCP and in-basket message sent to Dr. Delford Field and [facility] Care Manager RN with most recent event results and SDOH needs. Dr. Delford Field replied, noting he has asked [facility] to reach out to pt r/t b/p control. Since phone # goes to vm, which is full, letter sent to pt to call PCP for f/u ASAP.

## 2023-05-13 ENCOUNTER — Telehealth: Payer: Self-pay

## 2023-05-13 NOTE — Telephone Encounter (Signed)
Message received from Dorie Rank, RN/ [facility] noting that patient had expressed need for resources to address food, housing, transportation and utilities.  She was given some resources at a community event and I wanted to follow up with her.   I tried to reach her but her voicemail is full. Will try her again at a later time.  I also want to schedule her with Butch Penny, RPH/

## 2023-05-16 ENCOUNTER — Other Ambulatory Visit: Payer: Self-pay

## 2023-05-16 ENCOUNTER — Ambulatory Visit (HOSPITAL_COMMUNITY)
Admission: EM | Admit: 2023-05-16 | Discharge: 2023-05-16 | Disposition: A | Discharge status: Home / Self Care | Payer: MEDICAID | Attending: Physician Assistant | Admitting: Physician Assistant

## 2023-05-16 ENCOUNTER — Encounter (HOSPITAL_COMMUNITY): Payer: Self-pay | Admitting: Emergency Medicine

## 2023-05-16 DIAGNOSIS — I10 Essential (primary) hypertension: Secondary | ICD-10-CM | POA: Diagnosis not present

## 2023-05-16 DIAGNOSIS — M25552 Pain in left hip: Secondary | ICD-10-CM | POA: Diagnosis present

## 2023-05-16 DIAGNOSIS — B86 Scabies: Secondary | ICD-10-CM | POA: Insufficient documentation

## 2023-05-16 DIAGNOSIS — R197 Diarrhea, unspecified: Secondary | ICD-10-CM

## 2023-05-16 MED ORDER — AMLODIPINE BESYLATE 10 MG PO TABS: 10.0000 mg | ORAL_TABLET | Freq: Every day | ORAL | 2 refills | Status: DC

## 2023-05-16 MED ORDER — GABAPENTIN 300 MG PO CAPS: 300.0000 mg | ORAL_CAPSULE | Freq: Three times a day (TID) | ORAL | 1 refills | Status: DC

## 2023-05-16 MED ORDER — PERMETHRIN 5 % EX CREA: TOPICAL_CREAM | CUTANEOUS | 1 refills | Status: DC

## 2023-05-16 NOTE — Discharge Instructions (Addendum)
Bring stool sample back to clinic. Continue with Imodium as needed.  Push fluids, recommend pedialyte or gatorade.  Restart amlodipine and monitor blood pressure at home, keep follow up with cardiology tomorrow.  Schedule follow up appointment with your PCP Dr. Delford Field

## 2023-05-16 NOTE — ED Notes (Signed)
Sent home with supplies for collecting stool specimen to bring back to department

## 2023-05-16 NOTE — ED Provider Notes (Addendum)
[facility]    CSN: [id number] Arrival date & time: 05/16/23  1004      History   Chief Complaint Chief Complaint  Patient presents with   Diarrhea    HPI Ariana Jensen is a 56 y.o. female.   Patient presents for 2 weeks of diarrhea.  She reports foul smelling green diarrhea.  She denies nausea, vomiting.  Reports she is able to eat and drink.  She reports weight loss over the last 2 weeks.  She has been taking Imodium with minimal relief.  Denies abdominal pain.  She does experience right upper quadrant pain, known cholelithiasis.  She has been referred to general surgery, appointment pending.  She is also experiencing chronic shortness of breath.  She reports worse yesterday.  Denies chest pain, cough, wheezing, URI symptoms, lower extremity swelling.  She has been referred to cardiology.  She has an appointment upcoming on 06/17/2023.  She was seen in the ED in May for chest pain with negative workup.  Followed up with PCP and was referred to cardio.  She has hypertension.  Several medications listed in med list for hypertension.  She reports at this time she is only taking clonidine twice a day.  Will refill amlodipine and defer further HTN management to PCP and cardiology.  Patient requesting refill of gabapentin today, requesting refill of permethrin today.  BP Readings from Last 3 Encounters: 05/16/23 : (!) 198/101 05/07/23 : (!) 173/104 03/14/23 : Marland Kitchen 209/101      Past Medical History:  Diagnosis Date   Anxiety    ASCUS (atypical squamous cells of undetermined significance) on Pap smear    Body lice 08/05/2015   Chronic lower back pain    Cocaine abuse (HCC)    crack cocaine   Depression    Fibromyalgia    GERD (gastroesophageal reflux disease)    Hepatitis C    Hepatitis C antibody test positive    Hypertension    Hypokalemia 03/04/2018   Leukocytosis 03/04/2018   Opioid dependence (HCC)    PVC's (premature ventricular contractions) 03/04/2018    Scabies 05/13/2021   Seizures (HCC)    "don't know why; I've had 2 in the past year" (08/18/2016)   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   Trichomonas 04/17/2011   Diagnosed 04/02/11 during hospitalization, treated with Flagyl 2g, patient instructed to have partner treated (must follow-up)    Tuberculosis    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 age 88, now s/p 1-yr of multidrug treatment (dates unknown)    Patient Active Problem List   Diagnosis Date Noted   Myalgia due to statin 02/16/2023   Chronic bilateral low back pain with left-sided sciatica 02/16/2023   Calculus of gallbladder without cholecystitis without obstruction 02/16/2023   Precordial pain 02/16/2023   HTN (hypertension) 05/20/2021   Left hip pain 03/20/2021   MDD (major depressive disorder), recurrent episode, severe (HCC) 07/14/2018   PTSD (post-traumatic stress disorder) 06/22/2018   Bipolar I disorder, most recent episode depressed, severe without psychotic features (HCC) 06/21/2018   PVC's (premature ventricular contractions) 03/04/2018   Pruritic dermatitis 09/10/2015   Dysmenorrhea 04/10/2015   Colon cancer screening 03/07/2015   GERD (gastroesophageal reflux disease) 01/28/2012   Menorrhagia 01/25/2012   Tobacco abuse 11/06/2011   History of TB (tuberculosis) 10/22/2011   Polysubstance abuse (HCC) 04/17/2011    Past Surgical History:  Procedure Laterality Date   CESAREAN SECTION  1984   DILATION AND CURETTAGE OF UTERUS  I & D EXTREMITY  10/18/2011   Procedure: IRRIGATION AND DEBRIDEMENT EXTREMITY;  Surgeon: Sharma Covert, MD;  Location: [facility] OR;  Service: Orthopedics;  Laterality: Left;   I & D EXTREMITY  10/21/2011   Procedure: IRRIGATION AND DEBRIDEMENT EXTREMITY;  Surgeon: Sharma Covert, MD;  Location: [facility];  Service: Orthopedics;  Laterality: Left;   SALPINGECTOMY Left March 2010   ectopic pregnancy    OB History   No obstetric history on file.       Home Medications    Prior to Admission medications   Medication Sig Start Date End Date Taking? Authorizing Provider  amLODipine (NORVASC) 10 MG tablet Take 1 tablet (10 mg total) by mouth daily. 05/16/23   Ward, Tylene Fantasia, PA-C  Capsaicin 0.035 % CREA Apply to affected areas on skin twice daily 01/20/22   [name], MD  carvedilol (COREG) 25 MG tablet Take 1 tablet (25 mg total) by mouth 2 (two) times daily with a meal. Patient not taking: Reported on 05/16/2023 11/14/22   Eleonore Chiquito, FNP  cloNIDine (CATAPRES) 0.2 MG tablet Take 1 tablet (0.2 mg total) by mouth 2 (two) times daily. 11/14/22   Eleonore Chiquito, FNP  cyclobenzaprine (FLEXERIL) 10 MG tablet Take 1 tablet (10 mg total) by mouth 2 (two) times daily as needed for muscle spasms. 02/16/23   Carroll Sage, PA-C  divalproex (DEPAKOTE ER) 500 MG 24 hr tablet Take 1 tablet (500 mg total) by mouth every evening. 11/14/22   Eleonore Chiquito, FNP  gabapentin (NEURONTIN) 300 MG capsule Take 1 capsule (300 mg total) by mouth 3 (three) times daily. 05/16/23   Ward, Tylene Fantasia, PA-C  ivermectin (STROMECTOL) 3 MG TABS tablet Take 3 tablets at once 03/14/23   Valinda Hoar, NP  loperamide (IMODIUM) 2 MG capsule Take 1 capsule (2 mg total) by mouth 4 (four) times daily as needed for diarrhea or loose stools. 03/14/23   Valinda Hoar, NP  methadone (DOLOPHINE) 10 MG/ML solution Take 95 mg by mouth daily.    [provider]  mirtazapine (REMERON) 15 MG tablet Take 1 tablet (15 mg total) by mouth at bedtime. 04/02/22   [name], MD  naloxone [facility]) nasal spray 4 mg/0.1 mL Use if needed for overdose 04/02/22   [name], MD  nicotine polacrilex (NICORETTE MINI) 4 MG lozenge Use three times a day for smoking cessation 01/20/22   [name], MD  permethrin (ELIMITE) 5 % cream Apply 1 application topically onto affected area(s) once for one dose as directed 05/16/23   Ward, Tylene Fantasia, PA-C  spironolactone  (ALDACTONE) 50 MG tablet Take 1 tablet (50 mg total) by mouth daily. Patient not taking: Reported on 05/16/2023 11/14/22   Eleonore Chiquito, FNP  valsartan-hydrochlorothiazide (DIOVAN-HCT) 320-25 MG tablet Take 1 tablet by mouth daily. Patient not taking: Reported on 05/16/2023 11/14/22   Eleonore Chiquito, FNP    Family History Family History  Problem Relation Age of Onset   Heart attack Mother    Heart attack Father    Heart attack Paternal Grandmother    Heart attack Paternal Grandfather    Cirrhosis Brother    Breast cancer Neg Hx     Social History Social History   Tobacco Use   Smoking status: Every Day    Current packs/day: 0.50    Average packs/day: 0.5 packs/day for 32.0 years (16.0 ttl pk-yrs)    Types: Cigarettes   Smokeless tobacco: Never  Vaping Use  Vaping status: Never Used  Substance Use Topics   Alcohol use: Not Currently    Comment: occ   Drug use: Not Currently    Types: Oxycodone, Heroin, Cocaine, IV    Comment: Pt stated "I do heroin"... not used in 2 weeks per pt 03/14/2023     Allergies   Statins   Review of Systems Review of Systems  Constitutional:  Negative for chills and fever.  HENT:  Negative for ear pain and sore throat.   Eyes:  Negative for pain and visual disturbance.  Respiratory:  Positive for shortness of breath. Negative for cough.   Cardiovascular:  Negative for chest pain and palpitations.  Gastrointestinal:  Positive for diarrhea. Negative for abdominal pain and vomiting.  Genitourinary:  Negative for dysuria and hematuria.  Musculoskeletal:  Negative for arthralgias and back pain.  Skin:  Negative for color change and rash.  Neurological:  Negative for seizures and syncope.  All other systems reviewed and are negative.    Physical Exam Triage Vital Signs ED Triage Vitals  Encounter Vitals Group     BP 05/16/23 1037 (!) 196/108     Systolic BP Percentile --      Diastolic BP Percentile --      Pulse Rate 05/16/23 1037  (!) 53     Resp 05/16/23 1037 20     Temp 05/16/23 1037 98.3 F (36.8 C)     Temp Source 05/16/23 1037 Oral     SpO2 05/16/23 1037 96 %     Weight --      Height --      Head Circumference --      Peak Flow --      Pain Score 05/16/23 1032 7     Pain Loc --      Pain Education --      Exclude from Growth Chart --    No data found.  Updated Vital Signs BP (!) 198/101 (BP Location: Right Arm) Comment (BP Location): small adult cuff.  removed sleeve, repositioned patient  Pulse (!) 55   Temp 98.3 F (36.8 C) (Oral)   Resp 20   Wt 103 lb 6.4 oz (46.9 kg)   LMP 12/06/2015   SpO2 96%   BMI 18.32 kg/m   Visual Acuity Right Eye Distance:   Left Eye Distance:   Bilateral Distance:    Right Eye Near:   Left Eye Near:    Bilateral Near:     Physical Exam Vitals and nursing note reviewed.  Constitutional:      General: She is not in acute distress.    Appearance: She is well-developed.  HENT:     Head: Normocephalic and atraumatic.  Eyes:     Conjunctiva/sclera: Conjunctivae normal.  Cardiovascular:     Rate and Rhythm: Normal rate and regular rhythm.     Heart sounds: No murmur heard. Pulmonary:     Effort: Pulmonary effort is normal. No respiratory distress.     Breath sounds: Normal breath sounds.  Abdominal:     Palpations: Abdomen is soft.     Tenderness: There is no abdominal tenderness.  Musculoskeletal:        General: No swelling.     Cervical back: Neck supple.  Skin:    General: Skin is warm and dry.     Capillary Refill: Capillary refill takes less than 2 seconds.  Neurological:     Mental Status: She is alert.  Psychiatric:  Mood and Affect: Mood normal.      UC Treatments / Results  Labs (all labs ordered are listed, but only abnormal results are displayed) Labs Reviewed - No data to display  EKG   Radiology No results found.  Procedures Procedures (including critical care time)  Medications Ordered in UC Medications - No  data to display  Initial Impression / Assessment and Plan / UC Course  I have reviewed the triage vital signs and the nursing notes.  Pertinent labs & imaging results that were available during my care of the patient were reviewed by me and considered in my medical decision making (see chart for details).     Diarrhea.  Normal abdominal exam.  Patient tolerating fluids.  Advised to continue Imodium.  Will order GI panel and C. difficile.  Patient will bring sample back to clinic.  Advise follow-up with PCP.   Hypertension.  Patient is only taking clonidine at this time.  Will refill amlodipine 10 mg.  Advised to keep follow-up with cardiology on 06/17/2023.  Advised to monitor blood pressures at home. Shortness of breath is chronic.  Patient overall well-appearing in no acute distress today.  Lungs clear to auscultation.  She has had negative cardiac workup in ED and is seeing cardiology in 1 month. Final Clinical Impressions(s) / UC Diagnoses   Final diagnoses:  Diarrhea, unspecified type  Essential hypertension     Discharge Instructions      Bring stool sample back to clinic. Continue with Imodium as needed.  Push fluids, recommend pedialyte or gatorade.  Restart amlodipine and monitor blood pressure at home, keep follow up with cardiology tomorrow.  Schedule follow up appointment with your PCP Dr. Delford Field     ED Prescriptions     Medication Sig Dispense Auth. Provider   amLODipine (NORVASC) 10 MG tablet Take 1 tablet (10 mg total) by mouth daily. 60 tablet Ward, Shanda Bumps Z, PA-C   gabapentin (NEURONTIN) 300 MG capsule Take 1 capsule (300 mg total) by mouth 3 (three) times daily. 120 capsule Ward, Tylene Fantasia, PA-C   permethrin (ELIMITE) 5 % cream Apply 1 application topically onto affected area(s) once for one dose as directed 120 g Ward, Tylene Fantasia, PA-C      PDMP not reviewed this encounter.   Ward, Tylene Fantasia, PA-C 05/16/23 1106    Ward, Tylene Fantasia, PA-C 05/16/23 [phone]

## 2023-05-16 NOTE — ED Triage Notes (Signed)
Diarrhea for 2 weeks.  Reports she has lost weight.  Reports weight 126 one month ago.    Complains of sob 2 days ago.  History of the same , but not this bad per patient.  Patient is speaking in complete sentences.    Patient has a variety of symptoms.   Reports dr Shan Levans is her pcp.  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    Reports trying OTC anti-diarrheal meds.  One diarrhea episode today.

## 2023-05-20 LAB — CLOSTRIDIUM DIFFICILE BY PCR, REFLEXED: Toxigenic C. Difficile by PCR: POSITIVE — AB

## 2023-05-20 LAB — C DIFFICILE QUICK SCREEN W PCR REFLEX
C Diff antigen: POSITIVE — AB
C Diff toxin: NEGATIVE

## 2023-05-21 ENCOUNTER — Other Ambulatory Visit: Payer: Self-pay

## 2023-05-21 ENCOUNTER — Telehealth: Payer: Self-pay | Admitting: Emergency Medicine

## 2023-05-21 LAB — GASTROINTESTINAL PANEL BY PCR, STOOL (REPLACES STOOL CULTURE)

## 2023-05-21 MED ORDER — VANCOMYCIN HCL 125 MG PO CAPS
125.0000 mg | ORAL_CAPSULE | Freq: Four times a day (QID) | ORAL | 0 refills | Status: AC
  Filled 2023-05-21: qty 40, 10d supply, fill #0

## 2023-05-21 NOTE — Telephone Encounter (Signed)
Patient tested positive for C. difficile.  White blood cell count was not checked during her visit so unable to definitively determine whether or not she has severe nonsevere disease.  Patient did have extremely high blood pressure during her visit with a low heart rate but patient has a history of hypertension.  Will go ahead and treat patient as if this is a nonsevere infection with 10-day course of vancomycin 125 mg 4 times daily.  Patient advised to return for repeat testing if diarrhea does not resolve.

## 2023-05-26 ENCOUNTER — Other Ambulatory Visit: Payer: Self-pay

## 2023-05-26 ENCOUNTER — Telehealth: Payer: Self-pay | Admitting: *Deleted

## 2023-05-26 NOTE — Telephone Encounter (Signed)
Patient has been getting calls from UC regarding results and she needs help getting the results: Patient notified and pharmacy verified  Per L. Lequita Halt, PA-C, "After a quick look through her chart, looks like this is her first episode of C diff. I have sent a prescription for vancomycin 125 mg capsules to her pharmacy. She will need to take 1 capsule 4 times daily for 10 full days. Will you please give her a call to let her know? She should come back for retesting of her diarrhea does not resolve otherwise I do not believe that there is any indication to "test for cure".   Attempted to reach patient x1. LVM

## 2023-05-26 NOTE — Telephone Encounter (Signed)
I called the patient again and was able to leave a message requesting a call back

## 2023-06-02 NOTE — Telephone Encounter (Signed)
I tried to reach the patient again: [phone]  and had to leave a message requesting a call back

## 2023-06-16 NOTE — Progress Notes (Unsigned)
Cardiology Office Note:  .   Date:  06/16/2023  ID:  Ariana Jensen, DOB May 25, 1966, MRN [id number] PCP: [name], MD  [facility] Providers Cardiologist:  None { Click to update primary MD,subspecialty MD or APP then REFRESH:1}   History of Present Illness: .   Ariana Jensen is a 57 y.o. female HTN, GERD, polysubstance abuse, bipolar, referral for ED visit in May with chest pain. She had negative w/u.   She's had a SPECT in 2016 - she had possible fixed inferior scar vs. Gut attenuation. Echo at that time showed normal function, no WMA. She had syncope in 2019, another echo showing normal function; unremarkable.  Today,   ROS: ***  Studies Reviewed: .        *** Risk Assessment/Calculations:   *** ascvd based on new BP      Physical Exam:   VS:  LMP 12/06/2015    Wt Readings from Last 3 Encounters:  05/16/23 103 lb 6.4 oz (46.9 kg)  02/16/23 118 lb 6.4 oz (53.7 kg)  02/15/23 125 lb (56.7 kg)    GEN: Well nourished, well developed in no acute distress NECK: No JVD; No carotid bruits CARDIAC: ***RRR, no murmurs, rubs, gallops RESPIRATORY:  Clear to auscultation without rales, wheezing or rhonchi  ABDOMEN: Soft, non-tender, non-distended EXTREMITIES:  No edema; No deformity   ASSESSMENT AND PLAN: .   ***    {Are you ordering a CV Procedure (e.g. stress test, cath, DCCV, TEE, etc)?   Press F2        :[id number]}  Dispo: ***  Signed, Maisie Fus, MD

## 2023-06-17 ENCOUNTER — Encounter: Payer: Self-pay | Admitting: Internal Medicine

## 2023-06-17 ENCOUNTER — Ambulatory Visit: Payer: MEDICAID | Attending: Internal Medicine | Admitting: Internal Medicine

## 2023-06-17 ENCOUNTER — Telehealth: Payer: Self-pay

## 2023-06-17 VITALS — BP 102/72 | HR 49 | Ht 63.0 in | Wt 108.4 lb

## 2023-06-17 DIAGNOSIS — I10 Essential (primary) hypertension: Secondary | ICD-10-CM

## 2023-06-17 DIAGNOSIS — R079 Chest pain, unspecified: Secondary | ICD-10-CM | POA: Diagnosis not present

## 2023-06-17 DIAGNOSIS — I493 Ventricular premature depolarization: Secondary | ICD-10-CM | POA: Diagnosis not present

## 2023-06-17 MED ORDER — METOPROLOL TARTRATE 25 MG PO TABS: 25.0000 mg | ORAL_TABLET | Freq: Once | ORAL | 0 refills | Status: DC

## 2023-06-17 NOTE — Patient Instructions (Signed)
Medication Instructions:  Your physician recommends that you continue on your current medications as directed. Please refer to the Current Medication list given to you today.  *If you need a refill on your cardiac medications before your next appointment, please call your pharmacy*   Lab Work: None  If you have labs (blood work) drawn today and your tests are completely normal, you will receive your results only by: MyChart Message (if you have MyChart) OR A paper copy in the mail If you have any lab test that is abnormal or we need to change your treatment, we will call you to review the results.   Testing/Procedures:    Your cardiac CT will be scheduled at one of the below locations:   [facility] [address] [place], [place] [address] [phone]  Please arrive at the [facility] (Entrance C2) of [facility] 30 minutes prior to test start time. You can use the FREE valet parking offered at entrance C (encouraged to control the heart rate for the test)  Proceed to the [facility] Department (first floor) to check-in and test prep.  All radiology patients and guests should use entrance C2 at [facility], accessed from [facility], even though the hospital's physical address listed is [address]    Please follow these instructions carefully (unless otherwise directed):  An IV will be required for this test and Nitroglycerin will be given.   On the Night Before the Test: Be sure to Drink plenty of water. Do not consume any caffeinated/decaffeinated beverages or chocolate 12 hours prior to your test. Do not take any antihistamines 12 hours prior to your test.  On the Day of the Test: Drink plenty of water until 1 hour prior to the test. Do not eat any food 1 hour prior to test. You may take your regular medications prior to the test.  Take metoprolol tartrate 25mg  (Lopressor) two hours  prior to test. FEMALES- please wear underwire-free bra if available, avoid dresses & tight clothing      After the Test: Drink plenty of water. After receiving IV contrast, you may experience a mild flushed feeling. This is normal. On occasion, you may experience a mild rash up to 24 hours after the test. This is not dangerous. If this occurs, you can take Benadryl 25 mg and increase your fluid intake. If you experience trouble breathing, this can be serious. If it is severe call 911 IMMEDIATELY. If it is mild, please call our office. If you take any of these medications: Glipizide/Metformin, Avandament, Glucavance, please do not take 48 hours after completing test unless otherwise instructed.  We will call to schedule your test 2-4 weeks out understanding that some insurance companies will need an authorization prior to the service being performed.   For more information and frequently asked questions, please visit our website : [email or web address]  For non-scheduling related questions, please contact the cardiac imaging nurse navigator should you have any questions/concerns: Cardiac Imaging Nurse Navigators Direct Office Dial: [phone]   For scheduling needs, including cancellations and rescheduling, please call [place], [phone].   Follow-Up: At [facility], you and your health needs are our priority.  As part of our continuing mission to provide you with exceptional heart care, we have created designated Provider Care Teams.  These Care Teams include your primary Cardiologist (physician) and Advanced Practice Providers (APPs -  Physician Assistants and Nurse Practitioners) who all work together  to provide you with the care you need, when you need it.  We recommend signing up for the patient portal called "MyChart".  Sign up information is provided on this After Visit Summary.  MyChart is used to connect with patients for Virtual Visits (Telemedicine).   Patients are able to view lab/test results, encounter notes, upcoming appointments, etc.  Non-urgent messages can be sent to your provider as well.   To learn more about what you can do with MyChart, go to [email or web address].    Your next appointment:   As needed, also depending on CT results  Provider:   Carolan Clines, MD

## 2023-06-17 NOTE — Telephone Encounter (Signed)
I called and spoke with patient regarding the advice she was given about going to the ED for c-diff treatment. 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  I did advise patient that this is very important to treat and could affect her health and others in the home.  Pt voiced understanding.

## 2023-06-18 ENCOUNTER — Other Ambulatory Visit: Payer: Self-pay | Admitting: Internal Medicine

## 2023-06-19 ENCOUNTER — Other Ambulatory Visit: Payer: Self-pay | Admitting: Internal Medicine

## 2023-06-20 ENCOUNTER — Other Ambulatory Visit: Payer: Self-pay | Admitting: Internal Medicine

## 2023-06-21 ENCOUNTER — Other Ambulatory Visit: Payer: Self-pay | Admitting: Internal Medicine

## 2023-06-23 ENCOUNTER — Observation Stay (HOSPITAL_COMMUNITY)
Admission: EM | Admit: 2023-06-23 | Discharge: 2023-06-24 | Discharge status: Against Medical Advice | Payer: MEDICAID | Attending: Internal Medicine | Admitting: Internal Medicine

## 2023-06-23 ENCOUNTER — Emergency Department (HOSPITAL_COMMUNITY): Payer: MEDICAID

## 2023-06-23 ENCOUNTER — Encounter (HOSPITAL_COMMUNITY): Payer: Self-pay | Admitting: Internal Medicine

## 2023-06-23 ENCOUNTER — Other Ambulatory Visit: Payer: Self-pay

## 2023-06-23 DIAGNOSIS — F1910 Other psychoactive substance abuse, uncomplicated: Secondary | ICD-10-CM | POA: Diagnosis present

## 2023-06-23 DIAGNOSIS — R197 Diarrhea, unspecified: Secondary | ICD-10-CM | POA: Diagnosis not present

## 2023-06-23 DIAGNOSIS — D649 Anemia, unspecified: Secondary | ICD-10-CM | POA: Diagnosis present

## 2023-06-23 DIAGNOSIS — F17210 Nicotine dependence, cigarettes, uncomplicated: Secondary | ICD-10-CM | POA: Diagnosis not present

## 2023-06-23 DIAGNOSIS — R001 Bradycardia, unspecified: Secondary | ICD-10-CM | POA: Diagnosis not present

## 2023-06-23 DIAGNOSIS — Z79899 Other long term (current) drug therapy: Secondary | ICD-10-CM | POA: Diagnosis not present

## 2023-06-23 DIAGNOSIS — R634 Abnormal weight loss: Secondary | ICD-10-CM | POA: Diagnosis present

## 2023-06-23 DIAGNOSIS — B182 Chronic viral hepatitis C: Secondary | ICD-10-CM | POA: Diagnosis present

## 2023-06-23 DIAGNOSIS — I10 Essential (primary) hypertension: Secondary | ICD-10-CM | POA: Insufficient documentation

## 2023-06-23 DIAGNOSIS — D509 Iron deficiency anemia, unspecified: Secondary | ICD-10-CM | POA: Diagnosis not present

## 2023-06-23 DIAGNOSIS — R0789 Other chest pain: Secondary | ICD-10-CM | POA: Insufficient documentation

## 2023-06-23 DIAGNOSIS — I160 Hypertensive urgency: Secondary | ICD-10-CM | POA: Diagnosis present

## 2023-06-23 DIAGNOSIS — R1030 Lower abdominal pain, unspecified: Secondary | ICD-10-CM | POA: Diagnosis present

## 2023-06-23 DIAGNOSIS — R079 Chest pain, unspecified: Secondary | ICD-10-CM | POA: Diagnosis present

## 2023-06-23 DIAGNOSIS — L299 Pruritus, unspecified: Secondary | ICD-10-CM | POA: Diagnosis not present

## 2023-06-23 DIAGNOSIS — R109 Unspecified abdominal pain: Principal | ICD-10-CM

## 2023-06-23 DIAGNOSIS — L308 Other specified dermatitis: Secondary | ICD-10-CM | POA: Diagnosis present

## 2023-06-23 DIAGNOSIS — F1120 Opioid dependence, uncomplicated: Secondary | ICD-10-CM | POA: Diagnosis present

## 2023-06-23 LAB — URINALYSIS, ROUTINE W REFLEX MICROSCOPIC
Bacteria, UA: NONE SEEN
Bilirubin Urine: NEGATIVE
Glucose, UA: NEGATIVE mg/dL
Hgb urine dipstick: NEGATIVE
Ketones, ur: NEGATIVE mg/dL
Nitrite: NEGATIVE
Protein, ur: NEGATIVE mg/dL
Specific Gravity, Urine: 1.018 (ref 1.005–1.030)
pH: 7 (ref 5.0–8.0)

## 2023-06-23 LAB — RAPID URINE DRUG SCREEN, HOSP PERFORMED
Amphetamines: NOT DETECTED
Barbiturates: NOT DETECTED
Benzodiazepines: NOT DETECTED
Cocaine: POSITIVE — AB
Opiates: POSITIVE — AB
Tetrahydrocannabinol: NOT DETECTED

## 2023-06-23 LAB — CBC
HCT: 37.4 % (ref 36.0–46.0)
Hemoglobin: 11.8 g/dL — ABNORMAL LOW (ref 12.0–15.0)
MCH: 29.4 pg (ref 26.0–34.0)
MCHC: 31.6 g/dL (ref 30.0–36.0)
MCV: 93.3 fL (ref 80.0–100.0)
Platelets: 250 10*3/uL (ref 150–400)
RBC: 4.01 MIL/uL (ref 3.87–5.11)
RDW: 14.6 % (ref 11.5–15.5)
WBC: 7.4 10*3/uL (ref 4.0–10.5)
nRBC: 0 % (ref 0.0–0.2)

## 2023-06-23 LAB — BASIC METABOLIC PANEL
Anion gap: 11 (ref 5–15)
BUN: 17 mg/dL (ref 6–20)
CO2: 22 mmol/L (ref 22–32)
Calcium: 8.8 mg/dL — ABNORMAL LOW (ref 8.9–10.3)
Chloride: 105 mmol/L (ref 98–111)
Creatinine, Ser: 0.91 mg/dL (ref 0.44–1.00)
GFR, Estimated: >60 mL/min (ref >60)
Glucose, Bld: 87 mg/dL (ref 70–99)
Potassium: 3.6 mmol/L (ref 3.5–5.1)
Sodium: 138 mmol/L (ref 135–145)

## 2023-06-23 LAB — HEPATIC FUNCTION PANEL
ALT: 13 U/L (ref 0–44)
AST: 18 U/L (ref 15–41)
Albumin: 3.2 g/dL — ABNORMAL LOW (ref 3.5–5.0)
Alkaline Phosphatase: 55 U/L (ref 38–126)
Bilirubin, Direct: <0.1 mg/dL (ref 0.0–0.2)
Total Bilirubin: 0.4 mg/dL (ref 0.3–1.2)
Total Protein: 6.4 g/dL — ABNORMAL LOW (ref 6.5–8.1)

## 2023-06-23 LAB — MAGNESIUM: Magnesium: 1.9 mg/dL (ref 1.7–2.4)

## 2023-06-23 LAB — LIPASE, BLOOD: Lipase: 26 U/L (ref 11–51)

## 2023-06-23 LAB — HIV ANTIBODY (ROUTINE TESTING W REFLEX): HIV Screen 4th Generation wRfx: NONREACTIVE

## 2023-06-23 LAB — TROPONIN I (HIGH SENSITIVITY)
Troponin I (High Sensitivity): 3 ng/L (ref <18)
Troponin I (High Sensitivity): 6 ng/L (ref <18)

## 2023-06-23 LAB — T4, FREE: Free T4: 1.14 ng/dL — ABNORMAL HIGH (ref 0.61–1.12)

## 2023-06-23 LAB — TSH: TSH: 2.918 u[IU]/mL (ref 0.350–4.500)

## 2023-06-23 MED ORDER — METHADONE HCL 10 MG PO TABS
110.0000 mg | ORAL_TABLET | Freq: Every day | ORAL | Status: DC
  Administered 2023-06-23: 110 mg via ORAL
  Filled 2023-06-23: qty 11

## 2023-06-23 MED ORDER — AMLODIPINE BESYLATE 5 MG PO TABS
10.0000 mg | ORAL_TABLET | Freq: Every day | ORAL | Status: DC
  Administered 2023-06-23: 10 mg via ORAL
  Filled 2023-06-23: qty 2

## 2023-06-23 MED ORDER — METHOCARBAMOL 500 MG PO TABS
500.0000 mg | ORAL_TABLET | Freq: Three times a day (TID) | ORAL | Status: DC | PRN
  Administered 2023-06-23: 500 mg via ORAL
  Filled 2023-06-23: qty 1

## 2023-06-23 MED ORDER — NICOTINE 14 MG/24HR TD PT24
14.0000 mg | MEDICATED_PATCH | Freq: Every day | TRANSDERMAL | Status: DC
  Administered 2023-06-23: 14 mg via TRANSDERMAL
  Filled 2023-06-23: qty 1

## 2023-06-23 MED ORDER — DIVALPROEX SODIUM ER 500 MG PO TB24
500.0000 mg | ORAL_TABLET | Freq: Every day | ORAL | Status: DC
  Administered 2023-06-23: 500 mg via ORAL
  Filled 2023-06-23: qty 1

## 2023-06-23 MED ORDER — HYDRALAZINE HCL 20 MG/ML IJ SOLN
10.0000 mg | INTRAMUSCULAR | Status: DC | PRN
  Administered 2023-06-23: 10 mg via INTRAVENOUS
  Filled 2023-06-23: qty 1

## 2023-06-23 MED ORDER — ACETAMINOPHEN 325 MG PO TABS
650.0000 mg | ORAL_TABLET | Freq: Four times a day (QID) | ORAL | Status: DC | PRN
  Administered 2023-06-24: 650 mg via ORAL
  Filled 2023-06-23: qty 2

## 2023-06-23 MED ORDER — POTASSIUM CHLORIDE CRYS ER 20 MEQ PO TBCR
40.0000 meq | EXTENDED_RELEASE_TABLET | ORAL | Status: AC
  Administered 2023-06-23: 40 meq via ORAL
  Filled 2023-06-23: qty 2

## 2023-06-23 MED ORDER — CLONIDINE HCL 0.2 MG PO TABS
0.2000 mg | ORAL_TABLET | Freq: Two times a day (BID) | ORAL | Status: DC
  Administered 2023-06-23: 0.2 mg via ORAL
  Filled 2023-06-23: qty 1

## 2023-06-23 MED ORDER — ONDANSETRON 4 MG PO TBDP: 4.0000 mg | ORAL_TABLET | Freq: Four times a day (QID) | ORAL | Status: DC | PRN

## 2023-06-23 MED ORDER — SODIUM CHLORIDE 0.9% FLUSH
3.0000 mL | Freq: Two times a day (BID) | INTRAVENOUS | Status: DC
  Administered 2023-06-23: 3 mL via INTRAVENOUS

## 2023-06-23 MED ORDER — HYDROXYZINE HCL 25 MG PO TABS
25.0000 mg | ORAL_TABLET | Freq: Three times a day (TID) | ORAL | Status: DC | PRN
  Administered 2023-06-23: 25 mg via ORAL
  Filled 2023-06-23: qty 1

## 2023-06-23 MED ORDER — IOHEXOL 350 MG/ML SOLN
50.0000 mL | Freq: Once | INTRAVENOUS | Status: AC | PRN
  Administered 2023-06-23: 50 mL via INTRAVENOUS

## 2023-06-23 MED ORDER — TRIMETHOBENZAMIDE HCL 100 MG/ML IM SOLN: 200.0000 mg | Freq: Four times a day (QID) | INTRAMUSCULAR | Status: DC | PRN

## 2023-06-23 MED ORDER — ENOXAPARIN SODIUM 40 MG/0.4ML IJ SOSY: 40.0000 mg | PREFILLED_SYRINGE | INTRAMUSCULAR | Status: DC

## 2023-06-23 MED ORDER — LOPERAMIDE HCL 2 MG PO CAPS: 2.0000 mg | ORAL_CAPSULE | ORAL | Status: DC | PRN

## 2023-06-23 MED ORDER — PERMETHRIN 5 % EX CREA
1.0000 | TOPICAL_CREAM | CUTANEOUS | Status: DC
  Administered 2023-06-23: 1 via TOPICAL
  Filled 2023-06-23: qty 60

## 2023-06-23 MED ORDER — DICYCLOMINE HCL 20 MG PO TABS
20.0000 mg | ORAL_TABLET | Freq: Four times a day (QID) | ORAL | Status: DC | PRN
  Administered 2023-06-23 – 2023-06-24 (×2): 20 mg via ORAL
  Filled 2023-06-23 (×3): qty 1

## 2023-06-23 MED ORDER — SODIUM CHLORIDE 0.9 % IV BOLUS
1000.0000 mL | Freq: Once | INTRAVENOUS | Status: AC
  Administered 2023-06-23: 1000 mL via INTRAVENOUS

## 2023-06-23 MED ORDER — ACETAMINOPHEN 650 MG RE SUPP: 650.0000 mg | Freq: Four times a day (QID) | RECTAL | Status: DC | PRN

## 2023-06-23 MED ORDER — FENTANYL CITRATE PF 50 MCG/ML IJ SOSY
50.0000 ug | PREFILLED_SYRINGE | Freq: Once | INTRAMUSCULAR | Status: AC
  Administered 2023-06-23: 50 ug via INTRAVENOUS
  Filled 2023-06-23: qty 1

## 2023-06-23 MED ORDER — GABAPENTIN 300 MG PO CAPS
300.0000 mg | ORAL_CAPSULE | Freq: Three times a day (TID) | ORAL | Status: DC
  Administered 2023-06-23: 300 mg via ORAL
  Filled 2023-06-23: qty 1

## 2023-06-23 NOTE — ED Provider Notes (Signed)
Ariana Jensen Provider Note   CSN: [id number] Arrival date & time: 06/23/23  0865     History  Chief Complaint  Patient presents with   Bradycardia   Abdominal Pain    Ariana Jensen is a 57 y.o. female.  Patient here with generalized abdominal pain for the last few days ongoing diarrhea.  She does not feel like her treatment for C. difficile work.  She saw cardiologist recently as well because she has been having ongoing chest pain now with exertion.  She continues to have lower abdominal pain.  Nothing makes it worse or better.  History of hypertension, hepatitis C, opioid dependence, polysubstance abuse.  The history is provided by the patient.       Home Medications Prior to Admission medications   Medication Sig Start Date End Date Taking? Authorizing Provider  amLODipine (NORVASC) 10 MG tablet Take 1 tablet (10 mg total) by mouth daily. 05/16/23   Ward, Tylene Fantasia, PA-C  Capsaicin 0.035 % CREA Apply to affected areas on skin twice daily 01/20/22   [name], MD  carvedilol (COREG) 25 MG tablet Take 1 tablet (25 mg total) by mouth 2 (two) times daily with a meal. Patient not taking: Reported on 05/16/2023 11/14/22   Eleonore Chiquito, FNP  cloNIDine (CATAPRES) 0.2 MG tablet Take 1 tablet (0.2 mg total) by mouth 2 (two) times daily. 11/14/22   Eleonore Chiquito, FNP  cyclobenzaprine (FLEXERIL) 10 MG tablet Take 1 tablet (10 mg total) by mouth 2 (two) times daily as needed for muscle spasms. 02/16/23   Carroll Sage, PA-C  divalproex (DEPAKOTE ER) 500 MG 24 hr tablet Take 1 tablet (500 mg total) by mouth every evening. 11/14/22   Eleonore Chiquito, FNP  gabapentin (NEURONTIN) 300 MG capsule Take 1 capsule (300 mg total) by mouth 3 (three) times daily. 05/16/23   Ward, Tylene Fantasia, PA-C  ivermectin (STROMECTOL) 3 MG TABS tablet Take 3 tablets at once 03/14/23   Valinda Hoar, NP  loperamide (IMODIUM) 2 MG capsule Take 1 capsule (2  mg total) by mouth 4 (four) times daily as needed for diarrhea or loose stools. 03/14/23   Valinda Hoar, NP  methadone (DOLOPHINE) 10 MG/ML solution Take 95 mg by mouth daily.    [provider]  metoprolol tartrate (LOPRESSOR) 25 MG tablet Take 1 tablet (25 mg total) by mouth once for 1 dose. Take 1 tablet (25mg ) by mouth two (2) hours before CTA. 06/17/23 06/17/23  Maisie Fus, MD  mirtazapine (REMERON) 15 MG tablet Take 1 tablet (15 mg total) by mouth at bedtime. 04/02/22   [name], MD  naloxone [facility]) nasal spray 4 mg/0.1 mL Use if needed for overdose 04/02/22   [name], MD  nicotine polacrilex (NICORETTE MINI) 4 MG lozenge Use three times a day for smoking cessation 01/20/22   [name], MD  permethrin (ELIMITE) 5 % cream Apply 1 application topically onto affected area(s) once for one dose as directed 05/16/23   Ward, Tylene Fantasia, PA-C  spironolactone (ALDACTONE) 50 MG tablet Take 1 tablet (50 mg total) by mouth daily. 11/14/22   Eleonore Chiquito, FNP  valsartan-hydrochlorothiazide (DIOVAN-HCT) 320-25 MG tablet Take 1 tablet by mouth daily. 11/14/22   Eleonore Chiquito, FNP  vancomycin (VANCOCIN) 125 MG capsule Take 125 mg by mouth 4 (four) times daily.    [provider]      Allergies    Statins  Review of Systems   Review of Systems  Physical Exam Updated Vital Signs BP (!) 193/97   Pulse (!) 34   Temp 97.8 F (36.6 C)   Resp 15   Ht 5\' 3"  (1.6 m)   LMP 12/06/2015   SpO2 100%   BMI 19.20 kg/m  Physical Exam Vitals and nursing note reviewed.  Constitutional:      General: She is not in acute distress.    Appearance: She is well-developed.  HENT:     Head: Normocephalic and atraumatic.     Mouth/Throat:     Mouth: Mucous membranes are moist.  Eyes:     Extraocular Movements: Extraocular movements intact.     Conjunctiva/sclera: Conjunctivae normal.     Pupils: Pupils are equal, round, and reactive to light.   Cardiovascular:     Rate and Rhythm: Normal rate and regular rhythm.     Heart sounds: Normal heart sounds. No murmur heard. Pulmonary:     Effort: Pulmonary effort is normal. No respiratory distress.     Breath sounds: Normal breath sounds.  Abdominal:     Palpations: Abdomen is soft.     Tenderness: There is generalized abdominal tenderness.  Musculoskeletal:        General: No swelling.     Cervical back: Neck supple.  Skin:    General: Skin is warm and dry.     Capillary Refill: Capillary refill takes less than 2 seconds.  Neurological:     Mental Status: She is alert.  Psychiatric:        Mood and Affect: Mood normal.     ED Results / Procedures / Treatments   Labs (all labs ordered are listed, but only abnormal results are displayed) Labs Reviewed  BASIC METABOLIC PANEL - Abnormal; Notable for the following components:      Result Value   Calcium 8.8 (*)    All other components within normal limits  CBC - Abnormal; Notable for the following components:   Hemoglobin 11.8 (*)    All other components within normal limits  HEPATIC FUNCTION PANEL - Abnormal; Notable for the following components:   Total Protein 6.4 (*)    Albumin 3.2 (*)    All other components within normal limits  C DIFFICILE QUICK SCREEN W PCR REFLEX    GASTROINTESTINAL PANEL BY PCR, STOOL (REPLACES STOOL CULTURE)  LIPASE, BLOOD  TROPONIN I (HIGH SENSITIVITY)  TROPONIN I (HIGH SENSITIVITY)    EKG None  Radiology CT ABDOMEN PELVIS W CONTRAST  Result Date: 06/23/2023 CLINICAL DATA:  Generalized abdominal pain.  Diarrhea. EXAM: CT ABDOMEN AND PELVIS WITH CONTRAST TECHNIQUE: Multidetector CT imaging of the abdomen and pelvis was performed using the standard protocol following bolus administration of intravenous contrast. RADIATION DOSE REDUCTION: This exam was performed according to the departmental dose-optimization program which includes automated exposure control, adjustment of the mA and/or kV  according to patient size and/or use of iterative reconstruction technique. CONTRAST:  50mL OMNIPAQUE IOHEXOL 350 MG/ML SOLN COMPARISON:  CT abdomen/pelvis dated November 01, 2020. FINDINGS: Lower chest: No acute abnormality. Hepatobiliary: Diffusely decreased attenuation of the liver parenchyma consistent with hepatic steatosis. No suspicious focal liver lesion. The gallbladder is moderately distended. No gallstone. No gallbladder wall thickening or pericholecystic fluid. Similar degree of intra and extrahepatic biliary ductal dilation with the common duct measuring up to 9 mm (series 6, image 89). Pancreas: Unremarkable. No pancreatic ductal dilatation or surrounding inflammatory changes. Spleen: Normal in size without focal abnormality. Adrenals/Urinary  Tract: Adrenal glands are unremarkable. Kidneys are normal, without renal calculi, focal lesion, or hydronephrosis. Bladder is unremarkable. Stomach/Bowel: Stomach is within normal limits. Appendix appears normal. No evidence of bowel wall thickening, distention, or inflammatory changes. Vascular/Lymphatic: Nonaneurysmal aorta. Aortic atherosclerosis. No enlarged abdominal or pelvic lymph nodes. Reproductive: Uterus and bilateral adnexa are unremarkable. Other: Similar fat containing umbilical hernia. No abdominopelvic ascites. Musculoskeletal: No acute or significant osseous findings. IMPRESSION: 1. No acute localizing findings in the abdomen or pelvis. 2. 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 3. Hepatic steatosis. 4.  Aortic Atherosclerosis (ICD10-I70.0). Electronically Signed   By: Hart Robinsons M.D.   On: 06/23/2023 09:30   DG Chest 2 View  Result Date: 06/23/2023 CLINICAL DATA:  Chest pain with bradycardia. EXAM: CHEST - 2 VIEW COMPARISON:  Chest CT with contrast 02/15/2023 FINDINGS: The lungs are emphysematous but clear. Heart size and vascular pattern are normal. The mediastinum is stable with mild aortic tortuosity and atherosclerosis.  Thoracic cage is intact with thoracic spondylosis. Compare: Stable COPD chest. IMPRESSION: No evidence of acute chest disease. Emphysema. Aortic atherosclerosis. Electronically Signed   By: Almira Bar M.D.   On: 06/23/2023 07:26    Procedures Procedures    Medications Ordered in ED Medications  fentaNYL (SUBLIMAZE) injection 50 mcg (50 mcg Intravenous Given 06/23/23 0824)  iohexol (OMNIPAQUE) 350 MG/ML injection 50 mL (50 mLs Intravenous Contrast Given 06/23/23 0814)  sodium chloride 0.9 % bolus 1,000 mL (1,000 mLs Intravenous New Bag/Given 06/23/23 0933)    ED Course/ Medical Decision Making/ A&P                                 Medical Decision Making Amount and/or Complexity of Data Reviewed Labs: ordered. Radiology: ordered.  Risk Prescription drug management. Decision regarding hospitalization.   [name] is here with abdominal pain, chest pain.  Overall unremarkable vitals.  Sinus bradycardia on EKG.  Per cardiology note last week they wanted her admitted for chest pain workup.  However she states she could not come then.  She is having ongoing chest pain with exertion at times.  But also having ongoing abdominal pain with diarrhea.  Overall she has cardiac risk factors and abdominal risk factors.  She was just treated for C. difficile but does not feel like it was treated.  History of polysubstance abuse.  Overall CBC BMP troponin lipase ordered in triage.  Will add hepatic function panel and get repeat troponin.  Per my review interpretation of labs thus far there is no significant anemia or electrolyte abnormality or kidney injury or leukocytosis.  Lipase normal.  Chest x-ray with no evidence of pneumonia or pneumothorax.  I have talked with Dr. Allyson Sabal cardiology.  They recommend the hospitalist admit can get a CT coronary study likely tomorrow will prefer that we workup the abdominal pain first as I wanted to order a CT abdomen pelvis.  Patient given IV fluids, IV  fentanyl.  Stool studies have been ordered as well.  CT scan abdomen pelvis radiology reports unremarkable.  Overall we will admit for further cardiac workup.  Will see if she can provide [place] a stool sample.  Hemodynamically stable.  This chart was dictated using voice recognition software.  Despite best efforts to proofread,  errors can occur which can change the documentation meaning.          Final Clinical Impression(s) / ED Diagnoses Final diagnoses:  Abdominal pain,  unspecified abdominal location  Diarrhea, unspecified type  Chest pain, unspecified type    Rx / DC Orders ED Discharge Orders     None         Virgina Norfolk, DO 06/23/23 1047

## 2023-06-23 NOTE — ED Triage Notes (Signed)
Pt arrives to ED c/o generalized abdominal pain x several days and bradycardia with CP. Pt endorses diarrhea x 1 month. Pt set with appointment with cardiologist but hasn't made appointment yet. Pt received 2 sublingual nitroglycerin and 324mg  of aspirin.

## 2023-06-23 NOTE — ED Notes (Signed)
ED TO INPATIENT HANDOFF REPORT  ED Nurse Name and Phone #: Dahlia Client [phone]  S Name/Age/Gender Ariana Jensen 57 y.o. female Room/Bed: 042C/042C  Code Status   Code Status: Full Code  Home/SNF/Other Home Patient oriented to: self, place, time, and situation Is this baseline? Yes   Triage Complete: Triage complete  Chief Complaint Diarrhea [R19.7]  Triage Note Pt arrives to ED c/o generalized abdominal pain x several days and bradycardia with CP. Pt endorses diarrhea x 1 month. Pt set with appointment with cardiologist but hasn't made appointment yet. Pt received 2 sublingual nitroglycerin and 324mg  of aspirin.    Allergies Allergies  Allergen Reactions   Statins Other (See Comments)    MYALGIA    Level of Care/Admitting Diagnosis ED Disposition     ED Disposition  Admit   Condition  --   Comment  Hospital Area: [facility] [[id number]]  Level of Care: Progressive [102]  Admit to Progressive based on following criteria: CARDIOVASCULAR & THORACIC of moderate stability with acute coronary syndrome symptoms/low risk myocardial infarction/hypertensive urgency/arrhythmias/heart failure potentially compromising stability and stable post cardiovascular intervention patients.  May place patient in observation at [facility] or [facility] if equivalent level of care is available:: No  Covid Evaluation: Asymptomatic - no recent exposure (last 10 days) testing not required  Diagnosis: Diarrhea [787.91.ICD-9-CM]  Admitting Physician: Clydie Braun [[id number]]  Attending Physician: Clydie Braun [[id number]]          B Medical/Surgery History Past Medical History:  Diagnosis Date   Anxiety    ASCUS (atypical squamous cells of undetermined significance) on Pap smear    Body lice 08/05/2015   Chronic lower back pain    Cocaine abuse (HCC)    crack cocaine   Depression    Fibromyalgia    GERD (gastroesophageal reflux disease)    Hepatitis C     Hepatitis C antibody test positive    Hypertension    Hypokalemia 03/04/2018   Leukocytosis 03/04/2018   Opioid dependence (HCC)    PVC's (premature ventricular contractions) 03/04/2018   Scabies 05/13/2021   Seizures (HCC)    "don't know why; I've had 2 in the past year" (08/18/2016)   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   Trichomonas 04/17/2011   Diagnosed 04/02/11 during hospitalization, treated with Flagyl 2g, patient instructed to have partner treated (must follow-up)    Tuberculosis    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 age 52, now s/p 1-yr of multidrug treatment (dates unknown)   Past Surgical History:  Procedure Laterality Date   CESAREAN SECTION  1984   DILATION AND CURETTAGE OF UTERUS     I & D EXTREMITY  10/18/2011   Procedure: IRRIGATION AND DEBRIDEMENT EXTREMITY;  Surgeon: Sharma Covert, MD;  Location: [facility] OR;  Service: Orthopedics;  Laterality: Left;   I & D EXTREMITY  10/21/2011   Procedure: IRRIGATION AND DEBRIDEMENT EXTREMITY;  Surgeon: Sharma Covert, MD;  Location: [facility] OR;  Service: Orthopedics;  Laterality: Left;   SALPINGECTOMY Left March 2010   ectopic pregnancy     A IV Location/Drains/Wounds Patient Lines/Drains/Airways Status     Active Line/Drains/Airways     Name Placement date Placement time Site Days   Peripheral IV 06/23/23 20 G Anterior;Left Forearm 06/23/23  0824  Forearm  less than 1            Intake/Output Last 24 hours No intake or output data in the 24 hours ending 06/23/23  1647  Labs/Imaging Results for orders placed or performed during the hospital encounter of 06/23/23 (from the past 48 hour(s))  Basic metabolic panel     Status: Abnormal   Collection Time: 06/23/23  6:52 AM  Result Value Ref Range   Sodium 138 135 - 145 mmol/L   Potassium 3.6 3.5 - 5.1 mmol/L   Chloride 105 98 - 111 mmol/L   CO2 22 22 - 32 mmol/L   Glucose, Bld 87 70 - 99 mg/dL    Comment: Glucose reference range  applies only to samples taken after fasting for at least 8 hours.   BUN 17 6 - 20 mg/dL   Creatinine, Ser 1.61 0.44 - 1.00 mg/dL   Calcium 8.8 (L) 8.9 - 10.3 mg/dL   GFR, Estimated >09 >60 mL/min    Comment: (NOTE) Calculated using the CKD-EPI Creatinine Equation (2021)    Anion gap 11 5 - 15    Comment: Performed at [facility], [address], [place], [place] [address]  CBC     Status: Abnormal   Collection Time: 06/23/23  6:52 AM  Result Value Ref Range   WBC 7.4 4.0 - 10.5 K/uL   RBC 4.01 3.87 - 5.11 MIL/uL   Hemoglobin 11.8 (L) 12.0 - 15.0 g/dL   HCT 81.1 91.4 - 78.2 %   MCV 93.3 80.0 - 100.0 fL   MCH 29.4 26.0 - 34.0 pg   MCHC 31.6 30.0 - 36.0 g/dL   RDW 95.6 21.3 - 08.6 %   Platelets 250 150 - 400 K/uL   nRBC 0.0 0.0 - 0.2 %    Comment: Performed at [facility], [address], [place], [place] [address]  Troponin I (High Sensitivity)     Status: None   Collection Time: 06/23/23  6:52 AM  Result Value Ref Range   Troponin I (High Sensitivity) 3 <18 ng/L    Comment: (NOTE) Elevated high sensitivity troponin I (hsTnI) values and significant  changes across serial measurements may suggest ACS but many other  chronic and acute conditions are known to elevate hsTnI results.  Refer to the "Links" section for chest pain algorithms and additional  guidance. Performed at [facility], [address], [place], [place] [address]   Lipase, blood     Status: None   Collection Time: 06/23/23  6:52 AM  Result Value Ref Range   Lipase 26 11 - 51 U/L    Comment: Performed at [facility], [address], [place], [place] [address]  Troponin I (High Sensitivity)     Status: None   Collection Time: 06/23/23  9:10 AM  Result Value Ref Range   Troponin I (High Sensitivity) 6 <18 ng/L    Comment: (NOTE) Elevated high sensitivity troponin I (hsTnI) values and significant  changes across serial measurements may suggest ACS but many other  chronic and acute  conditions are known to elevate hsTnI results.  Refer to the "Links" section for chest pain algorithms and additional  guidance. Performed at [facility], [address] [place], [place] [address]   Hepatic function panel     Status: Abnormal   Collection Time: 06/23/23  9:10 AM  Result Value Ref Range   Total Protein 6.4 (L) 6.5 - 8.1 g/dL   Albumin 3.2 (L) 3.5 - 5.0 g/dL   AST 18 15 - 41 U/L   ALT 13 0 - 44 U/L   Alkaline Phosphatase 55 38 -  126 U/L   Total Bilirubin 0.4 0.3 - 1.2 mg/dL   Bilirubin, Direct <4.0 0.0 - 0.2 mg/dL   Indirect Bilirubin NOT CALCULATED 0.3 - 0.9 mg/dL    Comment: Performed at [facility], [address], [place], [place] [address]  Magnesium     Status: None   Collection Time: 06/23/23  2:02 PM  Result Value Ref Range   Magnesium 1.9 1.7 - 2.4 mg/dL    Comment: Performed at [facility], [address], [place], [place] [address]  HIV Antibody (routine testing w rflx)     Status: None   Collection Time: 06/23/23  2:02 PM  Result Value Ref Range   HIV Screen 4th Generation wRfx Non Reactive Non Reactive    Comment: Performed at [facility], [address], [place], [place] [address]   CT ABDOMEN PELVIS W CONTRAST  Result Date: 06/23/2023 CLINICAL DATA:  Generalized abdominal pain.  Diarrhea. EXAM: CT ABDOMEN AND PELVIS WITH CONTRAST TECHNIQUE: Multidetector CT imaging of the abdomen and pelvis was performed using the standard protocol following bolus administration of intravenous contrast. RADIATION DOSE REDUCTION: This exam was performed according to the departmental dose-optimization program which includes automated exposure control, adjustment of the mA and/or kV according to patient size and/or use of iterative reconstruction technique. CONTRAST:  50mL OMNIPAQUE IOHEXOL 350 MG/ML SOLN COMPARISON:  CT abdomen/pelvis dated November 01, 2020. FINDINGS: Lower chest: No acute abnormality. Hepatobiliary: Diffusely decreased  attenuation of the liver parenchyma consistent with hepatic steatosis. No suspicious focal liver lesion. The gallbladder is moderately distended. No gallstone. No gallbladder wall thickening or pericholecystic fluid. Similar degree of intra and extrahepatic biliary ductal dilation with the common duct measuring up to 9 mm (series 6, image 89). Pancreas: Unremarkable. No pancreatic ductal dilatation or surrounding inflammatory changes. Spleen: Normal in size without focal abnormality. Adrenals/Urinary Tract: Adrenal glands are unremarkable. Kidneys are normal, without renal calculi, focal lesion, or hydronephrosis. Bladder is unremarkable. Stomach/Bowel: Stomach is within normal limits. Appendix appears normal. No evidence of bowel wall thickening, distention, or inflammatory changes. Vascular/Lymphatic: Nonaneurysmal aorta. Aortic atherosclerosis. No enlarged abdominal or pelvic lymph nodes. Reproductive: Uterus and bilateral adnexa are unremarkable. Other: Similar fat containing umbilical hernia. No abdominopelvic ascites. Musculoskeletal: No acute or significant osseous findings. IMPRESSION: 1. No acute localizing findings in the abdomen or pelvis. 2. 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 3. Hepatic steatosis. 4.  Aortic Atherosclerosis (ICD10-I70.0). Electronically Signed   By: Hart Robinsons M.D.   On: 06/23/2023 09:30   DG Chest 2 View  Result Date: 06/23/2023 CLINICAL DATA:  Chest pain with bradycardia. EXAM: CHEST - 2 VIEW COMPARISON:  Chest CT with contrast 02/15/2023 FINDINGS: The lungs are emphysematous but clear. Heart size and vascular pattern are normal. The mediastinum is stable with mild aortic tortuosity and atherosclerosis. Thoracic cage is intact with thoracic spondylosis. Compare: Stable COPD chest. IMPRESSION: No evidence of acute chest disease. Emphysema. Aortic atherosclerosis. Electronically Signed   By: Almira Bar M.D.   On: 06/23/2023 07:26    Pending  Labs Unresulted Labs (From admission, onward)     Start     Ordered   06/24/23 0500  CBC  Tomorrow morning,   R        06/23/23 1138   06/24/23 0500  Basic metabolic panel  Tomorrow morning,   R        06/23/23 1138   06/23/23 0725  C Difficile Quick Screen w PCR reflex  (  C Difficile quick screen w PCR reflex panel )  Once, for 24 hours,   URGENT       References:    CDiff Information Tool   06/23/23 0724   06/23/23 0725  Gastrointestinal Panel by PCR , Stool  (Gastrointestinal Panel by PCR, Stool                                                                                                                                                     **Does Not include CLOSTRIDIUM DIFFICILE testing. **If CDIFF testing is needed, place order from the "C Difficile Testing" order set.**)  Once,   URGENT        06/23/23 0724            Vitals/Pain Today's Vitals   06/23/23 1500 06/23/23 1536 06/23/23 1549 06/23/23 1614  BP:  (!) 199/86 (!) 199/86 (!) 185/97  Pulse: (!) 43 (!) 38 (!) 57 (!) 41  Resp: 15 12  14   Temp:  98.1 F (36.7 C)    TempSrc:  Oral    SpO2: 99% 99%  98%  Height:      PainSc:        Isolation Precautions Enteric precautions (UV disinfection)  Medications Medications  enoxaparin (LOVENOX) injection 40 mg (40 mg Subcutaneous Not Given 06/23/23 1556)  sodium chloride flush (NS) 0.9 % injection 3 mL (3 mLs Intravenous Not Given 06/23/23 1150)  acetaminophen (TYLENOL) tablet 650 mg (has no administration in time range)    Or  acetaminophen (TYLENOL) suppository 650 mg (has no administration in time range)  trimethobenzamide (TIGAN) injection 200 mg (has no administration in time range)  hydrALAZINE (APRESOLINE) injection 10 mg (10 mg Intravenous Given 06/23/23 1550)  methadone (DOLOPHINE) tablet 110 mg (110 mg Oral Given 06/23/23 1325)  fentaNYL (SUBLIMAZE) injection 50 mcg (50 mcg Intravenous Given 06/23/23 0824)  iohexol (OMNIPAQUE) 350 MG/ML injection 50 mL (50 mLs  Intravenous Contrast Given 06/23/23 0814)  sodium chloride 0.9 % bolus 1,000 mL (0 mLs Intravenous Stopped 06/23/23 1235)  potassium chloride SA (KLOR-CON M) CR tablet 40 mEq (40 mEq Oral Given 06/23/23 1325)    Mobility walks     Focused Assessments Cardiac Assessment Handoff:  Cardiac Rhythm: Sinus bradycardia Lab Results  Component Value Date   CKTOTAL 1,137 (H) 03/05/2018   CKMB 1.8 04/03/2011   TROPONINI <0.03 03/04/2018   No results found for: "DDIMER" Does the Patient currently have chest pain? No    R Recommendations: See Admitting Provider Note  Report given to:   Additional Notes: Currently on permethrin for itching (from outpatient)

## 2023-06-23 NOTE — ED Notes (Signed)
Admitting provider at bedside.

## 2023-06-23 NOTE — ED Notes (Signed)
Help patient to the bathroom patient is back in bed on the monitor with call bell in reach

## 2023-06-23 NOTE — H&P (Addendum)
History and Physical    Patient: Ariana Jensen:[id number] DOB: [birth year] DOA: 06/23/2023 DOS: the patient was seen and examined on 06/23/2023 PCP: [name], MD  Patient coming from: Home  Chief Complaint:  Chief Complaint  Patient presents with   Bradycardia   Abdominal Pain   HPI: Ariana Jensen is a 57 y.o. female with medical history significant of hypertension, bipolar disorder, hepatitis C, opioid dependence, and polysubstance abuse, GERD,and recent history of C. difficile who presents with complaints of abdominal pain and continued diarrhea.  She reports that over the last month she has had persistent diarrhea and lost about 26 pounds.  Denies having any nausea or vomiting symptoms.  She had been diagnosed with C. difficile 8/28 and treated with a 10-day course of vancomycin which was prescribed on 8/30.  Despite taking the medication as recommended she reported having persistent diarrhea. She reports having generalized lower abdominal pain.  Other associated symptoms included left-sided chest pain and persistent itching.  Nothing has helped with abdominal pain.  Hydroxyzine has helped with itching and she reports still needing to use permethrin after recent being diagnosed with scabies. Denies having any recent fevers or sick contacts, besides her cat who had had diarrhea that has since resolved.  She  had been seen by Dr. Wyline Mood of cardiology 9/26 for evaluation of chest pain from her prior emergency room visit on 8/25.  Previous workup SPECT back in 2016 had possible fixed inferior scar versus gut attenuation with echo noting normal function with outwall motion abnormalities at that time.  She had also had a syncopal episode back in 2019 with another normal functioning echocardiogram.  She was recommended to come to the emergency department for coronary CTA  and possibly having clostridium difficile not responding to antibiotics.  She had not come to the hospital at that  time due to needing to care for elderly person.  She reports still smoking half pack cigarettes per day on average.  In the emergency department patient was noted to be afebrile with pulse 34-44, blood pressures elevated up to 193/97, and all of the signs maintained on room air.  Labs significant for hemoglobin 11.8, potassium 3.4, and high-sensitivity troponin negative.  Chest x-ray showed no acute abnormality with emphysema and aortic atherosclerosis.  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  Patient had been bolused 1 L of normal saline IV fluids and given fentanyl 50 mcg for pain.  Orders were placed to check for C. difficile and GI panel.  Patient had not been noted to have any episodes of diarrhea while here in the hospital.   Review of Systems: As mentioned in the history of present illness. All other systems reviewed and are negative. Past Medical History:  Diagnosis Date   Anxiety    ASCUS (atypical squamous cells of undetermined significance) on Pap smear    Body lice 08/05/2015   Chronic lower back pain    Cocaine abuse (HCC)    crack cocaine   Depression    Fibromyalgia    GERD (gastroesophageal reflux disease)    Hepatitis C    Hepatitis C antibody test positive    Hypertension    Hypokalemia 03/04/2018   Leukocytosis 03/04/2018   Opioid dependence (HCC)    PVC's (premature ventricular contractions) 03/04/2018   Scabies 05/13/2021   Seizures (HCC)    "don't know why; I've had 2 in the past year" (  08/18/2016)   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   Trichomonas 04/17/2011   Diagnosed 04/02/11 during hospitalization, treated with Flagyl 2g, patient instructed to have partner treated (must follow-up)    Tuberculosis    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 age 76, now s/p 1-yr of multidrug treatment (dates unknown)   Past Surgical History:   Procedure Laterality Date   CESAREAN SECTION  1984   DILATION AND CURETTAGE OF UTERUS     I & D EXTREMITY  10/18/2011   Procedure: IRRIGATION AND DEBRIDEMENT EXTREMITY;  Surgeon: Sharma Covert, MD;  Location: [facility] OR;  Service: Orthopedics;  Laterality: Left;   I & D EXTREMITY  10/21/2011   Procedure: IRRIGATION AND DEBRIDEMENT EXTREMITY;  Surgeon: Sharma Covert, MD;  Location: [facility] OR;  Service: Orthopedics;  Laterality: Left;   SALPINGECTOMY Left March 2010   ectopic pregnancy   Social History:  reports that she has been smoking cigarettes. She has a 16 pack-year smoking history. She has never used smokeless tobacco. She reports that she does not currently use alcohol. She reports that she does not currently use drugs after having used the following drugs: Oxycodone, Heroin, Cocaine, and IV.  Allergies  Allergen Reactions   Statins Other (See Comments)    MYALGIA    Family History  Problem Relation Age of Onset   Heart attack Mother    Heart attack Father    Heart attack Paternal Grandmother    Heart attack Paternal Grandfather    Cirrhosis Brother    Breast cancer Neg Hx     Prior to Admission medications   Medication Sig Start Date End Date Taking? Authorizing Provider  amLODipine (NORVASC) 10 MG tablet Take 1 tablet (10 mg total) by mouth daily. 05/16/23  Yes Ward, Tylene Fantasia, PA-C  cloNIDine (CATAPRES) 0.2 MG tablet Take 1 tablet (0.2 mg total) by mouth 2 (two) times daily. Patient taking differently: Take 2 tablets by mouth 2 (two) times daily. 11/14/22  Yes Eleonore Chiquito, FNP  divalproex (DEPAKOTE ER) 500 MG 24 hr tablet Take 1 tablet (500 mg total) by mouth every evening. Patient taking differently: Take 500 mg by mouth daily. 11/14/22  Yes Eleonore Chiquito, FNP  gabapentin (NEURONTIN) 300 MG capsule Take 1 capsule (300 mg total) by mouth 3 (three) times daily. 05/16/23  Yes Ward, Tylene Fantasia, PA-C  methadone (DOLOPHINE) 10 MG/ML solution Take 110 mg by mouth daily.   Yes  [provider]  naloxone [facility]) nasal spray 4 mg/0.1 mL Use if needed for overdose Patient taking differently: Place 0.4 mg into the nose as needed (overdose). 04/02/22  Yes [name], MD  carvedilol (COREG) 25 MG tablet Take 1 tablet (25 mg total) by mouth 2 (two) times daily with a meal. Patient not taking: Reported on 05/16/2023 11/14/22   Eleonore Chiquito, FNP  cyclobenzaprine (FLEXERIL) 10 MG tablet Take 1 tablet (10 mg total) by mouth 2 (two) times daily as needed for muscle spasms. Patient not taking: Reported on 06/23/2023 02/16/23   Carroll Sage, PA-C  ivermectin (STROMECTOL) 3 MG TABS tablet Take 3 tablets at once Patient not taking: Reported on 06/23/2023 03/14/23   Valinda Hoar, NP  metoprolol tartrate (LOPRESSOR) 25 MG tablet Take 1 tablet (25 mg total) by mouth once for 1 dose. Take 1 tablet (25mg ) by mouth two (2) hours before CTA. Patient not taking: Reported on 06/23/2023 06/17/23 06/23/23  Maisie Fus, MD  mirtazapine (REMERON) 15 MG tablet  Take 1 tablet (15 mg total) by mouth at bedtime. Patient not taking: Reported on 06/23/2023 04/02/22   [name], MD  permethrin (ELIMITE) 5 % cream Apply 1 application topically onto affected area(s) once for one dose as directed 05/16/23   Ward, Tylene Fantasia, PA-C  spironolactone (ALDACTONE) 50 MG tablet Take 1 tablet (50 mg total) by mouth daily. 11/14/22   Eleonore Chiquito, FNP  valsartan-hydrochlorothiazide (DIOVAN-HCT) 320-25 MG tablet Take 1 tablet by mouth daily. 11/14/22   Eleonore Chiquito, FNP  vancomycin (VANCOCIN) 125 MG capsule Take 125 mg by mouth 4 (four) times daily.    [provider]    Physical Exam: Vitals:   06/23/23 0730 06/23/23 0745 06/23/23 1000 06/23/23 1015  BP:  (!) 171/95 (!) 189/96 (!) 193/97  Pulse:  (!) 39 (!) 44 (!) 34  Resp: 16  14 15   Temp:      SpO2:  100% 100% 100%  Height:        Constitutional: Thin middle-aged female who appears to be uncomfortable but in no  acute distress Eyes: PERRL, lids and conjunctivae normal ENMT: Mucous membranes are moist.    Neck: normal, supple,  Respiratory: clear to auscultation bilaterally, no wheezing, no crackles. Normal respiratory effort. No accessory muscle use.  Cardiovascular: Regular rate and rhythm, no murmurs / rubs / gallops. No extremity edema.  Abdomen: Lower abdominal tenderness to palpation.  No hepatosplenomegaly. Bowel sounds positive.  Musculoskeletal: no clubbing / cyanosis. No joint deformity upper and lower extremities. Good ROM, no contractures. Normal muscle tone.  Skin: Multiple excoriations noted of the entire body. Neurologic: CN 2-12 grossly intact. Sensation intact, DTR normal. Strength 5/5 in all 4.  Psychiatric: Normal judgment and insight. Alert and oriented x 3. Normal mood.   Data Reviewed:  EKG reveals sinus bradycardia at 47 bpm with prolonged QTc interval 486.  Reviewed labs, imaging, and pertinent records as documented.  Assessment and Plan:  Diarrhea History of C. difficile Patient reports having persistent diarrhea.  Was diagnosed with C. difficile on 8/28 and despite being treated with 10-day course of oral vancomycin reports persistent diarrhea with weight loss.  CT scan of the abdomen pelvis did not note any acute abnormality.  Patient has not had a subsequent episode of diarrhea while being here in the hospital so far.  Lower suspicion for recurrence of C. difficile. -Admit to telemetry bed -Monitor intake and output -Check C. difficile and GI panel   -Continue to monitor and treat if found to be positive.  Hypertensive urgency Blood pressures elevated up to 193/97.  Home medication regimen includes amlodipine 10 mg daily and clonidine 0.2 mg twice daily.  She had not been taking Coreg due to the cost. -Continue clonidine and amlodipine -Hydralazine IV as needed  Bradycardia Acute.  Heart rates have been noted to be as low as 34-57 in the emergency department with  blood pressures elevated.  Reports not being on Coreg due to cost. -Avoiding heart rate lowering medications -Follow-up telemetry overnight  Lower abdominal pain Acute on chronic.  She reports having significant lower abdominal pain along with reports of diarrhea.  As noted above CT did not note any acute findings.  Question if related to possible withdrawals. -Check urinalysis -Bentyl as needed  Chest pain Patient reports intermittently having left-sided chest pain.  High-sensitivity troponins were noted to be negative.  EKG without significant ischemic changes.  Prior workups in the past had noted normal heart function.  Patient was recommended to  have a coronary CT by Dr. Wyline Mood. -Check echocardiogram -Dr. Gery Pray of cardiology notified and noted that they would place orders for Coronary CT study  Normocytic anemia Acute.  Hemoglobin 11.8 g/dL, but had been 16.1 when checked in May.  Patient denies any reports of bleeding. -Recheck H&H in a.m.  Pruritus Patient reports having significant itching all over.  She makes note of a recent history of scabies.  Unclear cause of patient's symptoms at this time, but question continued heroin use. -Hydroxyzine as needed for itching  Weight loss Patient reports losing approximately 26 pounds over the last month.  Possibly secondary to persistent diarrhea. 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  LFTs noted to be within normal limits.  Chronic opioid dependence -Continue methadone  Chronic hepatitis C Last noted to have HCV quantitative log of 5.84 back in 04/07/2021 -Check hepatitis C  History of seizures Records note patient previously had been on Lamictal for history of seizures.  No reported seizures recently.  History of polysubstance abuse Patient with prior history of tobacco, cocaine, heroin, and other  narcotic abuse abuse.  She reports continuing to smoke cigarettes. -Check UDS -Nicotine patch offered  DVT prophylaxis: Lovenox Advance Care Planning:   Code Status: Full Code   Consults: Cardiology  Family Communication: none  Severity of Illness: The appropriate patient status for this patient is OBSERVATION. Observation status is judged to be reasonable and necessary in order to provide the required intensity of service to ensure the patient's safety. The patient's presenting symptoms, physical exam findings, and initial radiographic and laboratory data in the context of their medical condition is felt to place them at decreased risk for further clinical deterioration. Furthermore, it is anticipated that the patient will be medically stable for discharge from the hospital within 2 midnights of admission.   Author: Clydie Braun, MD 06/23/2023 11:05 AM  For on call review [email or web address].

## 2023-06-24 ENCOUNTER — Observation Stay (HOSPITAL_COMMUNITY): Payer: MEDICAID

## 2023-06-24 LAB — CBC
HCT: 37.1 % (ref 36.0–46.0)
Hemoglobin: 11.7 g/dL — ABNORMAL LOW (ref 12.0–15.0)
MCH: 29 pg (ref 26.0–34.0)
MCHC: 31.5 g/dL (ref 30.0–36.0)
MCV: 92.1 fL (ref 80.0–100.0)
Platelets: 238 10*3/uL (ref 150–400)
RBC: 4.03 MIL/uL (ref 3.87–5.11)
RDW: 14.6 % (ref 11.5–15.5)
WBC: 6.3 10*3/uL (ref 4.0–10.5)
nRBC: 0 % (ref 0.0–0.2)

## 2023-06-24 LAB — BASIC METABOLIC PANEL
Anion gap: 13 (ref 5–15)
BUN: 17 mg/dL (ref 6–20)
CO2: 21 mmol/L — ABNORMAL LOW (ref 22–32)
Calcium: 8.5 mg/dL — ABNORMAL LOW (ref 8.9–10.3)
Chloride: 102 mmol/L (ref 98–111)
Creatinine, Ser: 0.76 mg/dL (ref 0.44–1.00)
GFR, Estimated: >60 mL/min (ref >60)
Glucose, Bld: 81 mg/dL (ref 70–99)
Potassium: 4.1 mmol/L (ref 3.5–5.1)
Sodium: 136 mmol/L (ref 135–145)

## 2023-06-24 NOTE — Plan of Care (Signed)
  Problem: Education: Goal: Knowledge of General Education information will improve Description: Including pain rating scale, medication(s)/side effects and non-pharmacologic comfort measures Outcome: Progressing   Problem: Clinical Measurements: Goal: Will remain free from infection Outcome: Progressing Goal: Respiratory complications will improve Outcome: Progressing   Problem: Activity: Goal: Risk for activity intolerance will decrease Outcome: Progressing   Problem: Coping: Goal: Level of anxiety will decrease Outcome: Progressing   Problem: Elimination: Goal: Will not experience complications related to bowel motility Outcome: Progressing Goal: Will not experience complications related to urinary retention Outcome: Progressing   Problem: Pain Managment: Goal: General experience of comfort will improve Outcome: Progressing   Problem: Safety: Goal: Ability to remain free from injury will improve Outcome: Progressing   Problem: Skin Integrity: Goal: Risk for impaired skin integrity will decrease Outcome: Progressing   Problem: Clinical Measurements: Goal: Ability to maintain clinical measurements within normal limits will improve Outcome: Not Progressing Goal: Diagnostic test results will improve Outcome: Not Progressing Goal: Cardiovascular complication will be avoided Outcome: Not Progressing   Problem: Nutrition: Goal: Adequate nutrition will be maintained Outcome: Not Progressing

## 2023-06-24 NOTE — Discharge Summary (Signed)
[facility] Discharge Summary   Patient: Ariana Jensen VOZ:[id number]   PCP: [name], MD DOB: 1966/01/28   Date of admission: 06/23/2023   Date of discharge: 06/24/2023    Discharge Disposition: Patient signed out AMA despite being explained the risks of doing so including worsening medical condition as well as death.   Discharge Diagnoses: Diarrhea History of C. Difficile colitis Hypertensive urgency  Bradycardia Lower abdominal pain Chest pain Normocytic anemia Pruritus Weight loss Intra and extrahepatic biliary duct dilatation Chronic hepatitis C Chronic opiate dependence History of polysubstance abuse History of seizures  Discharge Condition: Guarded  Hospital Course:  57 y.o. female with medical history significant of hypertension, bipolar disorder, hepatitis C, opioid dependence, and polysubstance abuse, GERD,and recent history of C. difficile who presented with complaints of chest pain, abdominal pain and continued diarrhea.  She had been recently seen by cardiology as an outpatient who had recommended coronary CT but had recommended inpatient hospitalization for workup of diarrhea.  On presentation, she was hypertensive and slightly bradycardic.  High-sensitivity troponin was negative.  Chest x-ray showed no acute abnormality with emphysema and aortic atherosclerosis.  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  She was given IV fluids.  Stool studies were ordered.  Cardiology ordered coronary CT.  Stool studies have been pending so far and coronary CTA is also pending.  Cardiology was also supposed to see her today.   patient left AMA  Procedures and Results: Coronary CTA was pending  Consultations: Cardiology consultation was pending  The results of significant diagnostics from this hospitalization (including imaging, microbiology, ancillary and laboratory) are listed  below for reference.    Significant Diagnostic Studies: CT ABDOMEN PELVIS W CONTRAST  Result Date: 06/23/2023 CLINICAL DATA:  Generalized abdominal pain.  Diarrhea. EXAM: CT ABDOMEN AND PELVIS WITH CONTRAST TECHNIQUE: Multidetector CT imaging of the abdomen and pelvis was performed using the standard protocol following bolus administration of intravenous contrast. RADIATION DOSE REDUCTION: This exam was performed according to the departmental dose-optimization program which includes automated exposure control, adjustment of the mA and/or kV according to patient size and/or use of iterative reconstruction technique. CONTRAST:  50mL OMNIPAQUE IOHEXOL 350 MG/ML SOLN COMPARISON:  CT abdomen/pelvis dated November 01, 2020. FINDINGS: Lower chest: No acute abnormality. Hepatobiliary: Diffusely decreased attenuation of the liver parenchyma consistent with hepatic steatosis. No suspicious focal liver lesion. The gallbladder is moderately distended. No gallstone. No gallbladder wall thickening or pericholecystic fluid. Similar degree of intra and extrahepatic biliary ductal dilation with the common duct measuring up to 9 mm (series 6, image 89). Pancreas: Unremarkable. No pancreatic ductal dilatation or surrounding inflammatory changes. Spleen: Normal in size without focal abnormality. Adrenals/Urinary Tract: Adrenal glands are unremarkable. Kidneys are normal, without renal calculi, focal lesion, or hydronephrosis. Bladder is unremarkable. Stomach/Bowel: Stomach is within normal limits. Appendix appears normal. No evidence of bowel wall thickening, distention, or inflammatory changes. Vascular/Lymphatic: Nonaneurysmal aorta. Aortic atherosclerosis. No enlarged abdominal or pelvic lymph nodes. Reproductive: Uterus and bilateral adnexa are unremarkable. Other: Similar fat containing umbilical hernia. No abdominopelvic ascites. Musculoskeletal: No acute or significant osseous findings. IMPRESSION: 1. No acute localizing  findings in the abdomen or pelvis. 2. 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 3. Hepatic steatosis. 4.  Aortic Atherosclerosis (ICD10-I70.0). Electronically Signed   By: Hart Robinsons M.D.   On: 06/23/2023 09:30   DG Chest 2 View  Result Date: 06/23/2023 CLINICAL DATA:  Chest pain  with bradycardia. EXAM: CHEST - 2 VIEW COMPARISON:  Chest CT with contrast 02/15/2023 FINDINGS: The lungs are emphysematous but clear. Heart size and vascular pattern are normal. The mediastinum is stable with mild aortic tortuosity and atherosclerosis. Thoracic cage is intact with thoracic spondylosis. Compare: Stable COPD chest. IMPRESSION: No evidence of acute chest disease. Emphysema. Aortic atherosclerosis. Electronically Signed   By: Almira Bar M.D.   On: 06/23/2023 07:26    Microbiology: No results found for this or any previous visit (from the past 240 hour(s)).   Labs: CBC: Recent Labs  Lab 06/23/23 0652 06/24/23 0311  WBC 7.4 6.3  HGB 11.8* 11.7*  HCT 37.4 37.1  MCV 93.3 92.1  PLT 250 238   Basic Metabolic Panel: Recent Labs  Lab 06/23/23 0652 06/23/23 1402 06/24/23 0311  NA 138  --  136  K 3.6  --  4.1  CL 105  --  102  CO2 22  --  21*  GLUCOSE 87  --  81  BUN 17  --  17  CREATININE 0.91  --  0.76  CALCIUM 8.8*  --  8.5*  MG  --  1.9  --    Liver Function Tests: Recent Labs  Lab 06/23/23 0910  AST 18  ALT 13  ALKPHOS 55  BILITOT 0.4  PROT 6.4*  ALBUMIN 3.2*   Recent Labs  Lab 06/23/23 0652  LIPASE 26   No results for input(s): "AMMONIA" in the last 168 hours. Cardiac Enzymes: No results for input(s): "CKTOTAL", "CKMB", "CKMBINDEX", "TROPONINI" in the last 168 hours. BNP (last 3 results) No results for input(s): "BNP" in the last 8760 hours. CBG: No results for input(s): "GLUCAP" in the last 168 hours.   Signed:  Glade Lloyd  [facility] 06/24/2023, 9:31 AM

## 2023-06-24 NOTE — Progress Notes (Signed)
Date: 06/24/2023 Patient: Ariana Jensen Admitted: 06/23/2023  6:33 AM Attending Provider: No att. providers found  Ariana Jensen or her authorized caregiver has made the decision for the patient to leave the emergency department against the advice of Dr. Osie Cheeks.  She or her authorized caregiver has been informed and understands the inherent risks, including death.  She or her authorized caregiver has decided to accept the responsibility for this decision. Ariana Jensen and all necessary parties have been advised that she may return for further evaluation or treatment. Her condition at time of discharge was Fair.  Ariana Jensen had current vital signs as follows:  Blood pressure (!) 154/89, pulse (!) 44, temperature 97.8 F (36.6 C), temperature source Oral, resp. rate 18, height 5\' 3"  (1.6 m), weight 47.3 kg, last menstrual period 12/06/2015, SpO2 100%.   Ariana Jensen or her authorized caregiver has signed the Leaving Against Medical Advice form prior to leaving the department.  Eugene Gavia Lenis Nettleton 06/24/2023

## 2023-06-25 ENCOUNTER — Telehealth (HOSPITAL_COMMUNITY): Payer: Self-pay | Admitting: *Deleted

## 2023-06-25 LAB — HCV RT-PCR, QUANT (NON-GRAPH)
HCV log10: 5.723 {Log}
Hepatitis C Quantitation: 529000 [IU]/mL

## 2023-06-25 LAB — HCV AB W REFLEX TO QUANT PCR: HCV Ab: REACTIVE — AB

## 2023-06-25 NOTE — Telephone Encounter (Signed)
Reaching out to patient to offer assistance regarding upcoming cardiac imaging study; pt verbalizes understanding of appt date/time, parking situation and where to check in, pre-test NPO status and medications ordered, and verified current allergies; name and call back number provided for further questions should they arise Hayley Sharpe RN Navigator Cardiac Imaging [facility] [phone] office [phone] cell  

## 2023-06-28 ENCOUNTER — Telehealth: Payer: Self-pay

## 2023-06-28 ENCOUNTER — Other Ambulatory Visit (HOSPITAL_COMMUNITY): Payer: Self-pay | Admitting: Physician Assistant

## 2023-06-28 ENCOUNTER — Ambulatory Visit (HOSPITAL_COMMUNITY): Payer: MEDICAID

## 2023-06-28 DIAGNOSIS — B182 Chronic viral hepatitis C: Secondary | ICD-10-CM

## 2023-06-28 NOTE — Transitions of Care (Post Inpatient/ED Visit) (Signed)
   06/28/2023  Name: Ariana Jensen MRN: [id number] DOB: 12-29-1965  Today's TOC FU Call Status: Today's TOC FU Call Status:: Unsuccessful Call (1st Attempt) Unsuccessful Call (1st Attempt) Date: 06/28/23  Attempted to reach the patient regarding the most recent Inpatient/ED visit.  Follow Up Plan: Additional outreach attempts will be made to reach the patient to complete the Transitions of Care (Post Inpatient/ED visit) call.   Signature  Robyne Peers, RN

## 2023-06-29 ENCOUNTER — Telehealth: Payer: Self-pay

## 2023-06-29 NOTE — Telephone Encounter (Signed)
-----   Message from Ariana Jensen sent at 06/28/2023 12:54 PM EDT ----- Please call patient and let her know that I did start a referral for her to be seen at [facility] to discuss potential treatment.

## 2023-06-29 NOTE — Transitions of Care (Post Inpatient/ED Visit) (Signed)
   06/29/2023  Name: Ariana Jensen MRN: [id number] DOB: 11-20-1965  Today's TOC FU Call Status: Today's TOC FU Call Status:: Unsuccessful Call (2nd Attempt) Unsuccessful Call (1st Attempt) Date: 06/28/23 Unsuccessful Call (2nd Attempt) Date: 06/29/23  Attempted to reach the patient regarding the most recent Inpatient/ED visit.  Follow Up Plan: Additional outreach attempts will be made to reach the patient to complete the Transitions of Care (Post Inpatient/ED visit) call.   Signature  Robyne Peers, RN

## 2023-06-29 NOTE — Telephone Encounter (Signed)
Called patient twice unable to make contact or leave voicemail, information sent to the nurse pool

## 2023-06-30 ENCOUNTER — Telehealth: Payer: Self-pay

## 2023-06-30 NOTE — Transitions of Care (Post Inpatient/ED Visit) (Signed)
   06/30/2023  Name: Ariana Jensen MRN: [id number] DOB: 1966/07/25  Today's TOC FU Call Status: Today's TOC FU Call Status:: Unsuccessful Call (3rd Attempt) Unsuccessful Call (1st Attempt) Date: 06/28/23 Unsuccessful Call (2nd Attempt) Date: 06/29/23 Unsuccessful Call (3rd Attempt) Date: 06/30/23  Attempted to reach the patient regarding the most recent Inpatient/ED visit.  Follow Up Plan: No further outreach attempts will be made at this time. We have been unable to contact the patient.  Letter sent to patient requesting she contact [facility] to schedule a follow up appointment as we have not been able to reach her.   Signature Robyne Peers, RN

## 2023-07-13 ENCOUNTER — Telehealth (HOSPITAL_COMMUNITY): Payer: Self-pay | Admitting: *Deleted

## 2023-07-13 ENCOUNTER — Encounter: Payer: Self-pay | Admitting: *Deleted

## 2023-07-13 NOTE — Progress Notes (Addendum)
Pt attended 05/07/23 screening event where her b/p was 173/104 and her blood sugar was 103. At the event the pt confirmed her PCP was Dr. Delford Field  and she identified food, housing, transportation and safety insecurities which chart review indicates were addressed with sharing respective resources with the pt, Chart review also indicates pt last saw her PCP, Dr. Delford Field on 02/16/23, when her BP was 134/87; and pt has future appt with Dr. Delford Field on 08/05/23.Dr. Lynelle Doctor clinic RN CM documented 3 phone and 1 letter attempt to contact pt for f/u with Dr. Delford Field and for her SDOH needs, with last attempt being 06/30/23. Cardiac imaging navigator RN was able to contact pt today for her 10/23 imaging study. Letter mailed to pt with  ongoing resources for her noted SDOH needs with a reminder to contact Dr. Lynelle Doctor office to f/u with their CM RN as well as Dr. Delford Field, and [place] transportation and healthcare, and addiction  recovery care Production manager.  Pt returned call and noted new phone number and address, which was updated in EPIC. Pt confirmed she is going to the cardiac testing tomorrow and that she has appt with Dr. Delford Field. She thought her appt was for Nov 12 but EPIC notes appt was changed by Dr. Lynelle Doctor office to 08/05/23 at 1:30. Pt so notified and letter sent also. Pt also noted that she was unable to get into My Chart and had tried to use it multiple times, so My Chart community team member Lindi Adie given pt's home contact info and has agreed to help her get connected to My Chart and pt was also given his name to know he would be calling to help her. Pt verbalized knowledge that Dr. Delford Field was retiring and would be helping her connect with another PCP in his office. In basket message with this info sent to Robyne Peers [facility] also. No additional health equity team support indicated at this time.

## 2023-07-13 NOTE — Telephone Encounter (Signed)
Reaching out to patient to offer assistance regarding upcoming cardiac imaging study; pt verbalizes understanding of appt date/time, parking situation and where to check in, pre-test NPO status and medications ordered, and verified current allergies; name and call back number provided for further questions should they arise Hayley Sharpe RN Navigator Cardiac Imaging [facility] [phone] office [phone] cell  

## 2023-07-14 ENCOUNTER — Encounter (HOSPITAL_COMMUNITY): Payer: Self-pay

## 2023-07-14 ENCOUNTER — Ambulatory Visit (HOSPITAL_COMMUNITY): Admission: RE | Admit: 2023-07-14 | Payer: MEDICAID | Source: Ambulatory Visit

## 2023-07-15 ENCOUNTER — Encounter: Payer: Self-pay | Admitting: Internal Medicine

## 2023-07-15 NOTE — Telephone Encounter (Signed)
Spoke to pt regarding her missed Cardiac CT appointment.  Advised her to call [place] at [phone] today/as soon as possible so that they could get it rescheduled if possible.

## 2023-07-20 ENCOUNTER — Ambulatory Visit: Payer: Medicaid Other | Admitting: Critical Care Medicine

## 2023-07-20 ENCOUNTER — Other Ambulatory Visit: Payer: Self-pay | Admitting: Critical Care Medicine

## 2023-07-20 DIAGNOSIS — M25552 Pain in left hip: Secondary | ICD-10-CM

## 2023-07-20 NOTE — Telephone Encounter (Signed)
Medication Refill - Medication: gabapentin (NEURONTIN) 300 MG capsule  Has the patient contacted their pharmacy? No. ( Preferred Pharmacy (with phone number or street name):  [id number] [place], [place] - [address] AT [facility] [address]  [address] [place] [address]  Phone: [phone] Fax: [phone]  Hours: Not open 24 hours      Has the patient been seen for an appointment in the last year OR does the patient have an upcoming appointment? Yes.    Agent: Please be advised that RX refills may take up to 3 business days. We ask that you follow-up with your pharmacy.

## 2023-07-21 MED ORDER — GABAPENTIN 300 MG PO CAPS: 300.0000 mg | ORAL_CAPSULE | Freq: Three times a day (TID) | ORAL | 1 refills | Status: DC

## 2023-07-21 NOTE — Telephone Encounter (Signed)
Requested Prescriptions  Pending Prescriptions Disp Refills   gabapentin (NEURONTIN) 300 MG capsule 120 capsule 1    Sig: Take 1 capsule (300 mg total) by mouth 3 (three) times daily.     Neurology: Anticonvulsants - gabapentin Passed - 07/20/2023 10:30 AM      Passed - Cr in normal range and within 360 days    Creat  Date Value Ref Range Status  08/21/2015 0.85 0.50 - 1.10 mg/dL Final   Creatinine, Ser  Date Value Ref Range Status  06/24/2023 0.76 0.44 - 1.00 mg/dL Final   Creatinine,U  Date Value Ref Range Status  08/21/2015 122.71 mg/dL Final    Comment:       Cutoff Values for Urine Drug Screen, Pain Mgmt          Drug Class           Cutoff (ng/mL)          Amphetamines             500          Barbiturates             200          Cocaine Metabolites      150          Benzodiazepines          200          Methadone                300          Opiates                  300          Phencyclidine             25          Propoxyphene             300          Marijuana Metabolites     50    For medical purposes only.    Creatinine, Urine  Date Value Ref Range Status  02/10/2018 129.97 mg/dL Final    Comment:    Performed at [facility], [address], [place], [place] [address]         Passed - Completed PHQ-2 or PHQ-9 in the last 360 days      Passed - Valid encounter within last 12 months    Recent Outpatient Visits           5 months ago Precordial pain   [facility] [name], MD   8 months ago Hypertension, unspecified type   [facility], FNP   10 months ago Hypertensive urgency   [place] [name], MD   1 year ago Hypertensive urgency   [facility] [name], MD   1 year ago Hypertensive urgency   [facility] [name], MD        Future Appointments             In 2 weeks [name], MD [facility]

## 2023-07-28 ENCOUNTER — Encounter (HOSPITAL_COMMUNITY): Payer: Self-pay

## 2023-07-29 ENCOUNTER — Telehealth (HOSPITAL_COMMUNITY): Payer: Self-pay | Admitting: Emergency Medicine

## 2023-07-29 NOTE — Telephone Encounter (Signed)
Attempted to call patient regarding upcoming cardiac CT appointment. °Left message on voicemail with name and callback number °Dwan Fennel RN Navigator Cardiac Imaging °[facility] °[phone] Office °[phone] Cell ° °

## 2023-07-30 ENCOUNTER — Ambulatory Visit (HOSPITAL_COMMUNITY): Admission: RE | Admit: 2023-07-30 | Payer: MEDICAID | Source: Ambulatory Visit
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
## 2023-08-03 ENCOUNTER — Ambulatory Visit: Payer: Medicaid Other | Admitting: Critical Care Medicine

## 2023-08-05 ENCOUNTER — Ambulatory Visit: Payer: MEDICAID | Attending: Critical Care Medicine | Admitting: Critical Care Medicine

## 2023-08-05 ENCOUNTER — Other Ambulatory Visit: Payer: Self-pay

## 2023-08-05 ENCOUNTER — Encounter: Payer: Self-pay | Admitting: Critical Care Medicine

## 2023-08-05 VITALS — BP 190/101 | HR 56 | Wt 112.6 lb

## 2023-08-05 DIAGNOSIS — R079 Chest pain, unspecified: Secondary | ICD-10-CM

## 2023-08-05 DIAGNOSIS — A0472 Enterocolitis due to Clostridium difficile, not specified as recurrent: Secondary | ICD-10-CM

## 2023-08-05 DIAGNOSIS — I10 Essential (primary) hypertension: Secondary | ICD-10-CM

## 2023-08-05 DIAGNOSIS — R197 Diarrhea, unspecified: Secondary | ICD-10-CM

## 2023-08-05 DIAGNOSIS — K8020 Calculus of gallbladder without cholecystitis without obstruction: Secondary | ICD-10-CM

## 2023-08-05 DIAGNOSIS — R768 Other specified abnormal immunological findings in serum: Secondary | ICD-10-CM

## 2023-08-05 DIAGNOSIS — B182 Chronic viral hepatitis C: Secondary | ICD-10-CM

## 2023-08-05 DIAGNOSIS — F314 Bipolar disorder, current episode depressed, severe, without psychotic features: Secondary | ICD-10-CM

## 2023-08-05 MED ORDER — CLONIDINE HCL 0.2 MG PO TABS
0.2000 mg | ORAL_TABLET | Freq: Two times a day (BID) | ORAL | 4 refills | Status: AC
  Filled 2023-08-05: qty 120, 60d supply, fill #0

## 2023-08-05 MED ORDER — AMLODIPINE BESYLATE 10 MG PO TABS
10.0000 mg | ORAL_TABLET | Freq: Every day | ORAL | 2 refills | Status: DC
  Filled 2023-08-05: qty 60, 60d supply, fill #0

## 2023-08-05 MED ORDER — DICLOFENAC SODIUM 1 % EX GEL
2.0000 g | Freq: Four times a day (QID) | CUTANEOUS | 1 refills | Status: DC
Start: 2023-08-05 — End: 2023-11-18
  Filled 2023-08-05: qty 100, 12d supply, fill #0

## 2023-08-05 MED ORDER — VALSARTAN-HYDROCHLOROTHIAZIDE 320-25 MG PO TABS
1.0000 | ORAL_TABLET | Freq: Every day | ORAL | 3 refills | Status: DC
  Filled 2023-08-05: qty 90, 90d supply, fill #0

## 2023-08-05 NOTE — Assessment & Plan Note (Signed)
Chest pain syndrome will refer to cardiology she needs a coronary CT scan performed patient says she has an appointment with cardiology she will follow-up

## 2023-08-05 NOTE — Assessment & Plan Note (Signed)
Chronic diarrheal syndrome prior history of C. difficile colitis referral to gastroenterology and obtain stool studies

## 2023-08-05 NOTE — Assessment & Plan Note (Signed)
Off medication observe

## 2023-08-05 NOTE — Patient Instructions (Signed)
Keep cardiology appointments and CT scan of heart appointment Referral to gastroenterology was made Referral back to hepatitis C clinic made Labs obtained today and stool studies Start valsartan HCT daily resume amlodipine daily continue clonidine as prescribed Return to see clinical pharmacy 1 month for your blood pressure Return to see primary care in this clinic in 3 months Results will be called to you of the labs and stool studies

## 2023-08-05 NOTE — Assessment & Plan Note (Signed)
Referral back to hepatitis C clinic

## 2023-08-05 NOTE — Assessment & Plan Note (Signed)
Concern the gallbladder may be a root cause here referral to gastroenterology

## 2023-08-05 NOTE — Assessment & Plan Note (Signed)
Hypertension currently poorly controlled plan is to renew clonidine 0.2 mg twice daily resume Norvasc 10 mg daily begin valsartan HCT 320/25 daily and obtain labs

## 2023-08-06 LAB — COMPREHENSIVE METABOLIC PANEL
ALT: 10 [IU]/L (ref 0–32)
AST: 18 [IU]/L (ref 0–40)
Albumin: 4.1 g/dL (ref 3.8–4.9)
Alkaline Phosphatase: 61 [IU]/L (ref 44–121)
BUN/Creatinine Ratio: 26 — ABNORMAL HIGH (ref 9–23)
BUN: 22 mg/dL (ref 6–24)
Bilirubin Total: <0.2 mg/dL (ref 0.0–1.2)
CO2: 21 mmol/L (ref 20–29)
Calcium: 9.2 mg/dL (ref 8.7–10.2)
Chloride: 105 mmol/L (ref 96–106)
Creatinine, Ser: 0.84 mg/dL (ref 0.57–1.00)
Globulin, Total: 2.7 g/dL (ref 1.5–4.5)
Glucose: 77 mg/dL (ref 70–99)
Potassium: 4.5 mmol/L (ref 3.5–5.2)
Sodium: 141 mmol/L (ref 134–144)
Total Protein: 6.8 g/dL (ref 6.0–8.5)
eGFR: 82 mL/min/{1.73_m2} (ref >59)

## 2023-08-06 NOTE — Progress Notes (Signed)
All labs normal

## 2023-08-09 ENCOUNTER — Telehealth: Payer: Self-pay

## 2023-08-09 NOTE — Telephone Encounter (Signed)
Pt was called and vm was left, Information has been sent to nurse pool.

## 2023-08-09 NOTE — Telephone Encounter (Signed)
-----   Message from Shan Levans sent at 08/06/2023  1:53 PM EST ----- All labs normal

## 2023-08-17 ENCOUNTER — Other Ambulatory Visit: Payer: Self-pay

## 2023-09-06 ENCOUNTER — Ambulatory Visit: Payer: MEDICAID | Admitting: Pharmacist

## 2023-09-16 ENCOUNTER — Ambulatory Visit: Payer: MEDICAID | Admitting: Pharmacist

## 2023-09-27 ENCOUNTER — Telehealth (HOSPITAL_COMMUNITY): Payer: Self-pay | Admitting: Emergency Medicine

## 2023-09-27 NOTE — Telephone Encounter (Signed)
 Reaching out to patient to offer assistance regarding upcoming cardiac imaging study; pt verbalizes understanding of appt date/time, parking situation and where to check in, pre-test NPO status and medications ordered, and verified current allergies; name and call back number provided for further questions should they arise Rockwell Alexandria RN Navigator Cardiac Imaging [facility] [phone] office [phone] cell

## 2023-09-28 ENCOUNTER — Ambulatory Visit (HOSPITAL_COMMUNITY)
Admission: RE | Admit: 2023-09-28 | Discharge: 2023-09-28 | Disposition: A | Discharge status: Home / Self Care | Payer: MEDICAID | Source: Ambulatory Visit | Attending: Internal Medicine | Admitting: Internal Medicine

## 2023-09-28 ENCOUNTER — Telehealth (HOSPITAL_COMMUNITY): Payer: Self-pay | Admitting: Emergency Medicine

## 2023-09-28 ENCOUNTER — Encounter (HOSPITAL_COMMUNITY): Payer: Self-pay

## 2023-09-28 DIAGNOSIS — R079 Chest pain, unspecified: Secondary | ICD-10-CM

## 2023-09-28 MED ORDER — NITROGLYCERIN 0.4 MG SL SUBL: 0.8000 mg | SUBLINGUAL_TABLET | Freq: Once | SUBLINGUAL | Status: DC

## 2023-09-28 MED ORDER — NITROGLYCERIN 0.4 MG SL SUBL
SUBLINGUAL_TABLET | SUBLINGUAL | Status: AC
  Filled 2023-09-28: qty 2

## 2023-09-28 NOTE — Progress Notes (Signed)
 Patient ID: Ariana Jensen, female   DOB: [birth year], 58 y.o.   MRN: [id number] Dr. Loni called pt drank whole pot of coffee today. Bp 181/114 heart rate 63-71.  Pt offered emergency room for blood pressure. Pt stated she felt fine. Pt. Left for home. DR. Loni cancelled CT heart

## 2023-09-29 NOTE — Telephone Encounter (Signed)
 error

## 2023-10-20 ENCOUNTER — Other Ambulatory Visit: Payer: Self-pay

## 2023-10-20 DIAGNOSIS — I493 Ventricular premature depolarization: Secondary | ICD-10-CM

## 2023-10-20 DIAGNOSIS — R079 Chest pain, unspecified: Secondary | ICD-10-CM

## 2023-10-21 ENCOUNTER — Other Ambulatory Visit: Payer: Self-pay | Admitting: Critical Care Medicine

## 2023-10-21 DIAGNOSIS — M25552 Pain in left hip: Secondary | ICD-10-CM

## 2023-11-04 ENCOUNTER — Encounter (HOSPITAL_COMMUNITY): Payer: Self-pay

## 2023-11-05 ENCOUNTER — Telehealth (HOSPITAL_COMMUNITY): Payer: Self-pay | Admitting: *Deleted

## 2023-11-05 NOTE — Telephone Encounter (Signed)
Attempted to call patient regarding upcoming cardiac CT appointment. Left message on voicemail with name and callback number Johney Frame RN Navigator Cardiac Imaging [facility] [phone] Office

## 2023-11-08 ENCOUNTER — Ambulatory Visit (HOSPITAL_COMMUNITY)
Admission: RE | Admit: 2023-11-08 | Discharge: 2023-11-08 | Disposition: A | Discharge status: Home / Self Care | Payer: MEDICAID | Source: Ambulatory Visit | Attending: Family | Admitting: Family

## 2023-11-08 DIAGNOSIS — R079 Chest pain, unspecified: Secondary | ICD-10-CM | POA: Insufficient documentation

## 2023-11-08 DIAGNOSIS — I493 Ventricular premature depolarization: Secondary | ICD-10-CM | POA: Insufficient documentation

## 2023-11-08 DIAGNOSIS — I2510 Atherosclerotic heart disease of native coronary artery without angina pectoris: Secondary | ICD-10-CM | POA: Diagnosis not present

## 2023-11-08 MED ORDER — NITROGLYCERIN 0.4 MG SL SUBL
SUBLINGUAL_TABLET | SUBLINGUAL | Status: AC
  Filled 2023-11-08: qty 2

## 2023-11-08 MED ORDER — IOHEXOL 350 MG/ML SOLN
95.0000 mL | Freq: Once | INTRAVENOUS | Status: AC | PRN
  Administered 2023-11-08: 95 mL via INTRAVENOUS

## 2023-11-08 MED ORDER — DILTIAZEM HCL 25 MG/5ML IV SOLN: 10.0000 mg | INTRAVENOUS | Status: DC | PRN

## 2023-11-08 MED ORDER — NITROGLYCERIN 0.4 MG SL SUBL
0.8000 mg | SUBLINGUAL_TABLET | Freq: Once | SUBLINGUAL | Status: AC
  Administered 2023-11-08: 0.8 mg via SUBLINGUAL

## 2023-11-08 MED ORDER — METOPROLOL TARTRATE 5 MG/5ML IV SOLN: 10.0000 mg | INTRAVENOUS | Status: DC | PRN

## 2023-11-09 ENCOUNTER — Other Ambulatory Visit: Payer: Self-pay | Admitting: Cardiology

## 2023-11-09 ENCOUNTER — Ambulatory Visit (HOSPITAL_COMMUNITY)
Admission: RE | Admit: 2023-11-09 | Discharge: 2023-11-09 | Disposition: A | Discharge status: Home / Self Care | Payer: MEDICAID | Source: Ambulatory Visit | Attending: Cardiology | Admitting: Cardiology

## 2023-11-09 ENCOUNTER — Encounter: Payer: Self-pay | Admitting: Physician Assistant

## 2023-11-09 ENCOUNTER — Encounter (HOSPITAL_BASED_OUTPATIENT_CLINIC_OR_DEPARTMENT_OTHER): Payer: Self-pay

## 2023-11-09 DIAGNOSIS — R931 Abnormal findings on diagnostic imaging of heart and coronary circulation: Secondary | ICD-10-CM | POA: Diagnosis not present

## 2023-11-09 DIAGNOSIS — I2510 Atherosclerotic heart disease of native coronary artery without angina pectoris: Secondary | ICD-10-CM

## 2023-11-10 ENCOUNTER — Ambulatory Visit: Payer: MEDICAID | Admitting: Internal Medicine

## 2023-11-11 ENCOUNTER — Ambulatory Visit: Payer: MEDICAID | Attending: Internal Medicine | Admitting: Internal Medicine

## 2023-11-11 VITALS — BP 176/90 | HR 66 | Ht 63.0 in | Wt 131.0 lb

## 2023-11-11 DIAGNOSIS — I10 Essential (primary) hypertension: Secondary | ICD-10-CM | POA: Diagnosis not present

## 2023-11-11 DIAGNOSIS — R931 Abnormal findings on diagnostic imaging of heart and coronary circulation: Secondary | ICD-10-CM | POA: Diagnosis not present

## 2023-11-11 DIAGNOSIS — R079 Chest pain, unspecified: Secondary | ICD-10-CM | POA: Diagnosis not present

## 2023-11-11 MED ORDER — ASPIRIN 81 MG PO TBEC: 81.0000 mg | DELAYED_RELEASE_TABLET | Freq: Every day | ORAL | Status: AC

## 2023-11-11 MED ORDER — CARVEDILOL 25 MG PO TABS: 25.0000 mg | ORAL_TABLET | Freq: Two times a day (BID) | ORAL | 3 refills | Status: AC

## 2023-11-11 MED ORDER — AMLODIPINE BESYLATE 10 MG PO TABS: 10.0000 mg | ORAL_TABLET | Freq: Every day | ORAL | 2 refills | Status: DC

## 2023-11-11 MED ORDER — ROSUVASTATIN CALCIUM 40 MG PO TABS: 40.0000 mg | ORAL_TABLET | Freq: Every day | ORAL | 3 refills | Status: AC

## 2023-11-11 NOTE — Patient Instructions (Addendum)
Medication Instructions:  START ASPIRIN 81 MG DAILY. START CRESTOR 40 MG DAILY.  START COREG 25 MG TWICE DAILY.    Lab Work: FASTING LIPID PROFILE WITH LIPO PROTEIN A TO BE DONE TOMORROW CBC BMP   Testing/Procedures:  [facility] A DEPT OF [facility] AT [facility] [address] [place] [address] [place] [address] Dept: [phone] Loc: [phone]  Mio Schellinger  11/11/2023  You are scheduled for a Cardiac Catheterization on Thursday, February 27 with Dr. Peter [place].  1. Please arrive at the [facility] (Main Entrance A) at [facility]: [address] [place], [place] [address] at 10:00 AM (This time is 2 hour(s) before your procedure to ensure your preparation).   Free valet parking service is available. You will check in at ADMITTING. The support person will be asked to wait in the waiting room.  It is OK to have someone drop you off and come back when you are ready to be discharged.    Special note: Every effort is made to have your procedure done on time. Please understand that emergencies sometimes delay scheduled procedures.  2. Diet: Do not eat solid foods after midnight.  The patient may have clear liquids until 5am upon the day of the procedure.  3. Labs: CBC AND BMP TODAY  4. Medication instructions in preparation for your procedure:   Contrast Allergy: No    On the morning of your procedure, take your Aspirin 81 mg and any morning medicines NOT listed above.  You may use sips of water.  5. Plan to go home the same day, you will only stay overnight if medically necessary. 6. Bring a current list of your medications and current insurance cards. 7. You MUST have a responsible person to drive you home. 8. Someone MUST be with you the first 24 hours after you arrive home or your discharge will be delayed. 9. Please wear clothes that are easy to get on and off and wear slip-on shoes.  Thank  you for allowing [place] to care for you!   -- [facility]     Follow-Up: At [facility], you and your health needs are our priority.  As part of our continuing mission to provide you with exceptional heart care, we have created designated Provider Care Teams.  These Care Teams include your primary Cardiologist (physician) and Advanced Practice Providers (APPs -  Physician Assistants and Nurse Practitioners) who all work together to provide you with the care you need, when you need it.   Your next appointment:   1 month(s)  Provider:   DR. 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

## 2023-11-11 NOTE — Progress Notes (Addendum)
Cardiology Office Note:  .   Date:  11/11/2023  ID:  Ariana Jensen, DOB 04-12-1966, MRN [id number] PCP: [name], MD  [facility] Providers Cardiologist:  None    History of Present Illness: .   Ariana Jensen is a 59 y.o. female HTN, GERD, polysubstance abuse, bipolar, hepatitis C referral for ED visit in May with chest pain. She had negative w/u.   She's had a SPECT in 2016 - she had possible fixed inferior scar vs. gut attenuation. Echo at that time showed normal function, no WMA. She had syncope in 2019, another echo showing normal function; unremarkable.   Today, she is having persistent diarrhea. She had a recent C diff infection in August. She's lost a significant amount of weight. She notes chest pain and SOB as well. She can barely walk around the block. She notes chest pain as a pressure, like an elephant sitting on her chest.  Father had MI around 55.   ROS:  per HPI otherwise negative  Interim hx 11/11/2023 She returns after her coronary CTA.  Her score was elevated bated to 2190.  She had significant high risk plaque in her proximal RCA.  FFR did not meet criteria for flow-limiting lesion however with the significant abundance of plaque this can possibly be a false negative.  She also had high risk plaque in the mid LAD and proximal OM1.  There is also a suspected fistula between the distal RCA and the middle cardiac vein.   Studies Reviewed: Marland Kitchen   EKG Interpretation Date/Time:  Thursday November 11 2023 14:48:41 EST Ventricular Rate:  66 PR Interval:  148 QRS Duration:  84 QT Interval:  506 QTC Calculation: 530 R Axis:   34  Text Interpretation: Sinus rhythm with occasional Premature ventricular complexes Possible Left atrial enlargement Minimal voltage criteria for LVH, may be normal variant ( Sokolow-Lyon ) Prolonged QT When compared with ECG of 23-Jun-2023 06:24, Premature ventricular complexes are now Present Confirmed by Carolan Clines ([id number]) on  11/11/2023 2:59:18 PM    Risk Assessment/Calculations:   The 10-year ASCVD risk score (Arnett DK, et al., 2019) is: 13.1%   Values used to calculate the score:     Age: 56 years     Sex: Female     Is Non-Hispanic African American: No     Diabetic: No     Tobacco smoker: Yes     Systolic Blood Pressure: 176 mmHg     Is BP treated: Yes     HDL Cholesterol: 51 mg/dL     Total Cholesterol: 193 mg/dL       Physical Exam:   VS:  BP (!) 176/90 (BP Location: Left Arm, Patient Position: Sitting, Cuff Size: Normal)   Pulse 66   Ht 5\' 3"  (1.6 m)   Wt 131 lb (59.4 kg)   LMP 12/06/2015   SpO2 93%   BMI 23.21 kg/m    Wt Readings from Last 3 Encounters:  11/11/23 131 lb (59.4 kg)  08/05/23 112 lb 9.6 oz (51.1 kg)  06/24/23 104 lb 4.4 oz (47.3 kg)    GEN: Well nourished, well developed in no acute distress NECK: No JVD; No carotid bruits CARDIAC: RRR, no murmurs, rubs, gallops RESPIRATORY:  Clear to auscultation without rales, wheezing or rhonchi  Vasc: 2+ R radial ABDOMEN: Soft, non-tender, non-distended EXTREMITIES:  No edema; No deformity   ASSESSMENT AND PLAN: .   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  No valve disease.  No pulmonary hypertension -Start aspirin 81 mg daily, start Crestor 40 mg daily -Fasting lipid panel, lipoprotein (a) - starting BB - schedule a LHC today; suspect she is a good surgical candidate  HTN Poorly controlled - start coreg 25 mg BID -Continue amlodipine 10 mg daily -Prescribed clonidine 0.2 mg twice daily  Tobacco Abuse -Recommend smoking cessation  Hepatitis C  -Recommend precautions for any procedures - not treated  Polysubstance abuse hx -on methadone    Informed Consent   Shared Decision Making/Informed Consent The risks [stroke (1 in 1000), death (1 in 1000), kidney failure [usually temporary] (1 in 500),  bleeding (1 in 200), allergic reaction [possibly serious] (1 in 200)], benefits (diagnostic support and management of coronary artery disease) and alternatives of a cardiac catheterization were discussed in detail with Ariana Jensen and she is willing to proceed.      Dispo: Follow-up in 1 month  Signed, Nahmir Zeidman, Alben Spittle, MD

## 2023-11-11 NOTE — H&P (View-Only) (Signed)
 Cardiology Office Note:  .   Date:  11/11/2023  ID:  Ariana Jensen, DOB 04-12-1966, MRN [id number] PCP: [name], MD  [facility] Providers Cardiologist:  None    History of Present Illness: .   Ariana Jensen is a 59 y.o. female HTN, GERD, polysubstance abuse, bipolar, hepatitis C referral for ED visit in May with chest pain. She had negative w/u.   She's had a SPECT in 2016 - she had possible fixed inferior scar vs. gut attenuation. Echo at that time showed normal function, no WMA. She had syncope in 2019, another echo showing normal function; unremarkable.   Today, she is having persistent diarrhea. She had a recent C diff infection in August. She's lost a significant amount of weight. She notes chest pain and SOB as well. She can barely walk around the block. She notes chest pain as a pressure, like an elephant sitting on her chest.  Father had MI around 55.   ROS:  per HPI otherwise negative  Interim hx 11/11/2023 She returns after her coronary CTA.  Her score was elevated bated to 2190.  She had significant high risk plaque in her proximal RCA.  FFR did not meet criteria for flow-limiting lesion however with the significant abundance of plaque this can possibly be a false negative.  She also had high risk plaque in the mid LAD and proximal OM1.  There is also a suspected fistula between the distal RCA and the middle cardiac vein.   Studies Reviewed: Marland Kitchen   EKG Interpretation Date/Time:  Thursday November 11 2023 14:48:41 EST Ventricular Rate:  66 PR Interval:  148 QRS Duration:  84 QT Interval:  506 QTC Calculation: 530 R Axis:   34  Text Interpretation: Sinus rhythm with occasional Premature ventricular complexes Possible Left atrial enlargement Minimal voltage criteria for LVH, may be normal variant ( Sokolow-Lyon ) Prolonged QT When compared with ECG of 23-Jun-2023 06:24, Premature ventricular complexes are now Present Confirmed by Carolan Clines ([id number]) on  11/11/2023 2:59:18 PM    Risk Assessment/Calculations:   The 10-year ASCVD risk score (Arnett DK, et al., 2019) is: 13.1%   Values used to calculate the score:     Age: 56 years     Sex: Female     Is Non-Hispanic African American: No     Diabetic: No     Tobacco smoker: Yes     Systolic Blood Pressure: 176 mmHg     Is BP treated: Yes     HDL Cholesterol: 51 mg/dL     Total Cholesterol: 193 mg/dL       Physical Exam:   VS:  BP (!) 176/90 (BP Location: Left Arm, Patient Position: Sitting, Cuff Size: Normal)   Pulse 66   Ht 5\' 3"  (1.6 m)   Wt 131 lb (59.4 kg)   LMP 12/06/2015   SpO2 93%   BMI 23.21 kg/m    Wt Readings from Last 3 Encounters:  11/11/23 131 lb (59.4 kg)  08/05/23 112 lb 9.6 oz (51.1 kg)  06/24/23 104 lb 4.4 oz (47.3 kg)    GEN: Well nourished, well developed in no acute distress NECK: No JVD; No carotid bruits CARDIAC: RRR, no murmurs, rubs, gallops RESPIRATORY:  Clear to auscultation without rales, wheezing or rhonchi  Vasc: 2+ R radial ABDOMEN: Soft, non-tender, non-distended EXTREMITIES:  No edema; No deformity   ASSESSMENT AND PLAN: .   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  No valve disease.  No pulmonary hypertension -Start aspirin 81 mg daily, start Crestor 40 mg daily -Fasting lipid panel, lipoprotein (a) - starting BB - schedule a LHC today; suspect she is a good surgical candidate  HTN Poorly controlled - start coreg 25 mg BID -Continue amlodipine 10 mg daily -Prescribed clonidine 0.2 mg twice daily  Tobacco Abuse -Recommend smoking cessation  Hepatitis C  -Recommend precautions for any procedures - not treated  Polysubstance abuse hx -on methadone    Informed Consent   Shared Decision Making/Informed Consent The risks [stroke (1 in 1000), death (1 in 1000), kidney failure [usually temporary] (1 in 500),  bleeding (1 in 200), allergic reaction [possibly serious] (1 in 200)], benefits (diagnostic support and management of coronary artery disease) and alternatives of a cardiac catheterization were discussed in detail with Ariana Jensen and she is willing to proceed.      Dispo: Follow-up in 1 month  Signed, Nahmir Zeidman, Alben Spittle, MD

## 2023-11-11 NOTE — Addendum Note (Signed)
Addended byCarolan Clines on: 11/11/2023 04:40 PM   Modules accepted: Orders

## 2023-11-16 ENCOUNTER — Telehealth: Payer: Self-pay | Admitting: *Deleted

## 2023-11-16 LAB — CBC

## 2023-11-16 NOTE — Telephone Encounter (Signed)
 Cardiac Catheterization scheduled at [facility] for: Thursday November 18, 2023 12 Noon Arrival time [facility] Main Entrance A at: 10 AM  Nothing to eat after midnight prior to procedure, clear liquids until 5 AM day of procedure.  Medication instructions: -Usual morning medications can be taken with sips of water including aspirin 81 mg.  Plan to go home the same day, you will only stay overnight if medically necessary.  You must have responsible adult to drive you home.  Someone must be with you the first 24 hours after you arrive home.  Reviewed procedure instructions with patient.

## 2023-11-17 LAB — LIPID PANEL
Chol/HDL Ratio: 3.4 {ratio} (ref 0.0–4.4)
Cholesterol, Total: 203 mg/dL — ABNORMAL HIGH (ref 100–199)
HDL: 60 mg/dL (ref >39)
LDL Chol Calc (NIH): 125 mg/dL — ABNORMAL HIGH (ref 0–99)
Triglycerides: 101 mg/dL (ref 0–149)
VLDL Cholesterol Cal: 18 mg/dL (ref 5–40)

## 2023-11-17 LAB — BASIC METABOLIC PANEL
BUN/Creatinine Ratio: 31 — ABNORMAL HIGH (ref 9–23)
BUN: 30 mg/dL — ABNORMAL HIGH (ref 6–24)
CO2: 24 mmol/L (ref 20–29)
Calcium: 9.1 mg/dL (ref 8.7–10.2)
Chloride: 102 mmol/L (ref 96–106)
Creatinine, Ser: 0.98 mg/dL (ref 0.57–1.00)
Glucose: 88 mg/dL (ref 70–99)
Potassium: 5.2 mmol/L (ref 3.5–5.2)
Sodium: 139 mmol/L (ref 134–144)
eGFR: 68 mL/min/{1.73_m2} (ref >59)

## 2023-11-17 LAB — LIPOPROTEIN A (LPA): Lipoprotein (a): 11 nmol/L (ref <75.0)

## 2023-11-17 LAB — CBC
Hematocrit: 37.3 % (ref 34.0–46.6)
Hemoglobin: 12.2 g/dL (ref 11.1–15.9)
MCH: 29.5 pg (ref 26.6–33.0)
MCHC: 32.7 g/dL (ref 31.5–35.7)
MCV: 90 fL (ref 79–97)
Platelets: 240 10*3/uL (ref 150–450)
RBC: 4.14 x10E6/uL (ref 3.77–5.28)
RDW: 12.9 % (ref 11.7–15.4)
WBC: 6.8 10*3/uL (ref 3.4–10.8)

## 2023-11-18 ENCOUNTER — Encounter (HOSPITAL_COMMUNITY): Payer: Self-pay | Admitting: Cardiology

## 2023-11-18 ENCOUNTER — Ambulatory Visit (HOSPITAL_COMMUNITY)
Admission: RE | Admit: 2023-11-18 | Discharge: 2023-11-18 | Disposition: A | Discharge status: Home / Self Care | Payer: MEDICAID | Attending: Cardiology | Admitting: Cardiology

## 2023-11-18 ENCOUNTER — Other Ambulatory Visit: Payer: Self-pay

## 2023-11-18 ENCOUNTER — Encounter (HOSPITAL_COMMUNITY)
Admission: RE | Disposition: A | Discharge status: Home / Self Care | Payer: Self-pay | Source: Home / Self Care | Attending: Cardiology

## 2023-11-18 DIAGNOSIS — F1911 Other psychoactive substance abuse, in remission: Secondary | ICD-10-CM | POA: Insufficient documentation

## 2023-11-18 DIAGNOSIS — I2510 Atherosclerotic heart disease of native coronary artery without angina pectoris: Secondary | ICD-10-CM | POA: Diagnosis not present

## 2023-11-18 DIAGNOSIS — Z720 Tobacco use: Secondary | ICD-10-CM | POA: Insufficient documentation

## 2023-11-18 DIAGNOSIS — Z79899 Other long term (current) drug therapy: Secondary | ICD-10-CM | POA: Insufficient documentation

## 2023-11-18 DIAGNOSIS — Z8249 Family history of ischemic heart disease and other diseases of the circulatory system: Secondary | ICD-10-CM | POA: Insufficient documentation

## 2023-11-18 DIAGNOSIS — K219 Gastro-esophageal reflux disease without esophagitis: Secondary | ICD-10-CM | POA: Diagnosis not present

## 2023-11-18 DIAGNOSIS — Z7982 Long term (current) use of aspirin: Secondary | ICD-10-CM | POA: Diagnosis not present

## 2023-11-18 DIAGNOSIS — R079 Chest pain, unspecified: Secondary | ICD-10-CM

## 2023-11-18 DIAGNOSIS — I10 Essential (primary) hypertension: Secondary | ICD-10-CM | POA: Diagnosis not present

## 2023-11-18 DIAGNOSIS — B1920 Unspecified viral hepatitis C without hepatic coma: Secondary | ICD-10-CM | POA: Diagnosis not present

## 2023-11-18 DIAGNOSIS — Z79891 Long term (current) use of opiate analgesic: Secondary | ICD-10-CM | POA: Diagnosis not present

## 2023-11-18 DIAGNOSIS — R931 Abnormal findings on diagnostic imaging of heart and coronary circulation: Secondary | ICD-10-CM

## 2023-11-18 DIAGNOSIS — I2584 Coronary atherosclerosis due to calcified coronary lesion: Secondary | ICD-10-CM | POA: Diagnosis not present

## 2023-11-18 HISTORY — PX: LEFT HEART CATH AND CORONARY ANGIOGRAPHY: CATH118249

## 2023-11-18 SURGERY — LEFT HEART CATH AND CORONARY ANGIOGRAPHY
Anesthesia: LOCAL

## 2023-11-18 MED ORDER — VERAPAMIL HCL 2.5 MG/ML IV SOLN
INTRAVENOUS | Status: AC
Start: 2023-11-18 — End: ?
  Filled 2023-11-18: qty 2

## 2023-11-18 MED ORDER — FENTANYL CITRATE (PF) 100 MCG/2ML IJ SOLN
INTRAMUSCULAR | Status: DC | PRN
  Administered 2023-11-18 (×2): 25 ug via INTRAVENOUS

## 2023-11-18 MED ORDER — HEPARIN SODIUM (PORCINE) 1000 UNIT/ML IJ SOLN
INTRAMUSCULAR | Status: DC | PRN
  Administered 2023-11-18: 3000 [IU] via INTRAVENOUS

## 2023-11-18 MED ORDER — MIDAZOLAM HCL 2 MG/2ML IJ SOLN
INTRAMUSCULAR | Status: AC
  Filled 2023-11-18: qty 2

## 2023-11-18 MED ORDER — SODIUM CHLORIDE 0.9% FLUSH: 3.0000 mL | Freq: Two times a day (BID) | INTRAVENOUS | Status: DC

## 2023-11-18 MED ORDER — HEPARIN SODIUM (PORCINE) 1000 UNIT/ML IJ SOLN
INTRAMUSCULAR | Status: AC
  Filled 2023-11-18: qty 10

## 2023-11-18 MED ORDER — SODIUM CHLORIDE 0.9% FLUSH: 3.0000 mL | INTRAVENOUS | Status: DC | PRN

## 2023-11-18 MED ORDER — SODIUM CHLORIDE 0.9 % WEIGHT BASED INFUSION: 1.0000 mL/kg/h | INTRAVENOUS | Status: DC

## 2023-11-18 MED ORDER — HYDRALAZINE HCL 20 MG/ML IJ SOLN: 10.0000 mg | INTRAMUSCULAR | Status: DC | PRN

## 2023-11-18 MED ORDER — HEPARIN (PORCINE) IN NACL 1000-0.9 UT/500ML-% IV SOLN
INTRAVENOUS | Status: DC | PRN
  Administered 2023-11-18 (×2): 500 mL

## 2023-11-18 MED ORDER — LIDOCAINE HCL (PF) 1 % IJ SOLN
INTRAMUSCULAR | Status: DC | PRN
  Administered 2023-11-18: 2 mL via INTRADERMAL

## 2023-11-18 MED ORDER — VERAPAMIL HCL 2.5 MG/ML IV SOLN
INTRAVENOUS | Status: DC | PRN
  Administered 2023-11-18: 10 mL via INTRA_ARTERIAL

## 2023-11-18 MED ORDER — IOHEXOL 350 MG/ML SOLN
INTRAVENOUS | Status: DC | PRN
  Administered 2023-11-18: 90 mL

## 2023-11-18 MED ORDER — ONDANSETRON HCL 4 MG/2ML IJ SOLN
4.0000 mg | Freq: Four times a day (QID) | INTRAMUSCULAR | Status: DC | PRN
Start: 2023-11-18 — End: 2023-11-18

## 2023-11-18 MED ORDER — SODIUM CHLORIDE 0.9 % IV SOLN: 250.0000 mL | INTRAVENOUS | Status: DC | PRN

## 2023-11-18 MED ORDER — FENTANYL CITRATE (PF) 100 MCG/2ML IJ SOLN
INTRAMUSCULAR | Status: AC
  Filled 2023-11-18: qty 2

## 2023-11-18 MED ORDER — ACETAMINOPHEN 325 MG PO TABS: 650.0000 mg | ORAL_TABLET | ORAL | Status: DC | PRN

## 2023-11-18 MED ORDER — SODIUM CHLORIDE 0.9 % WEIGHT BASED INFUSION
3.0000 mL/kg/h | INTRAVENOUS | Status: AC
Start: 2023-11-18 — End: 2023-11-18

## 2023-11-18 MED ORDER — LIDOCAINE HCL (PF) 1 % IJ SOLN
INTRAMUSCULAR | Status: AC
Start: 2023-11-18 — End: ?
  Filled 2023-11-18: qty 30

## 2023-11-18 MED ORDER — MIDAZOLAM HCL 2 MG/2ML IJ SOLN
INTRAMUSCULAR | Status: DC | PRN
  Administered 2023-11-18 (×2): 1 mg via INTRAVENOUS

## 2023-11-18 SURGICAL SUPPLY — 10 items
CATH 5FR JL3.5 JR4 ANG PIG MP (CATHETERS) IMPLANT
DEVICE RAD TR BAND REGULAR (VASCULAR PRODUCTS) IMPLANT
GLIDESHEATH SLEND SS 6F .021 (SHEATH) IMPLANT
GUIDEWIRE INQWIRE 1.5J.035X260 (WIRE) IMPLANT
INQWIRE 1.5J .035X260CM (WIRE) ×1 IMPLANT
KIT SINGLE USE MANIFOLD (KITS) IMPLANT
PACK CARDIAC CATHETERIZATION (CUSTOM PROCEDURE TRAY) ×1 IMPLANT
SET ATX-X65L (MISCELLANEOUS) IMPLANT
SHEATH PROBE COVER 6X72 (BAG) IMPLANT
WIRE HI TORQ VERSACORE-J 145CM (WIRE) IMPLANT

## 2023-11-18 NOTE — Progress Notes (Signed)
 VAST consult received to obtain IV access. Upon review of consult, patient already in cathlab.

## 2023-11-18 NOTE — Interval H&P Note (Signed)
 History and Physical Interval Note:  11/18/2023 12:10 PM  Ariana Jensen  has presented today for surgery, with the diagnosis of cad  abn cta.  The various methods of treatment have been discussed with the patient and family. After consideration of risks, benefits and other options for treatment, the patient has consented to  Procedure(s): LEFT HEART CATH AND CORONARY ANGIOGRAPHY (N/A) as a surgical intervention.  The patient's history has been reviewed, patient examined, no change in status, stable for surgery.  I have reviewed the patient's chart and labs.  Questions were answered to the patient's satisfaction.   Cath Lab Visit (complete for each Cath Lab visit)  Clinical Evaluation Leading to the Procedure:   ACS: No.  Non-ACS:    Anginal Classification: CCS III  Anti-ischemic medical therapy: Maximal Therapy (2 or more classes of medications)  Non-Invasive Test Results: Low-risk stress test findings: cardiac mortality <1%/year  Prior CABG: No previous CABG        Theron Arista [facility] 11/18/2023 12:10 PM

## 2023-11-18 NOTE — Discharge Instructions (Signed)
 Radial Site Care  This sheet gives you information about how to care for yourself after your procedure. Your health care provider may also give you more specific instructions. If you have problems or questions, contact your health care provider. What can I expect after the procedure? After the procedure, it is common to have: Bruising and tenderness at the catheter insertion area. Follow these instructions at home: Medicines Take over-the-counter and prescription medicines only as told by your health care provider. Insertion site care Follow instructions from your health care provider about how to take care of your insertion site. Make sure you: Wash your hands with soap and water before you remove your bandage (dressing). If soap and water are not available, use hand sanitizer. May remove dressing in 24 hours. Check your insertion site every day for signs of infection. Check for: Redness, swelling, or pain. Fluid or blood. Pus or a bad smell. Warmth. Do no take baths, swim, or use a hot tub for 5 days. You may shower 24-48 hours after the procedure. Remove the dressing and gently wash the site with plain soap and water. Pat the area dry with a clean towel. Do not rub the site. That could cause bleeding. Do not apply powder or lotion to the site. Activity  For 24 hours after the procedure, or as directed by your health care provider: Do not flex or bend the affected arm. Do not push or pull heavy objects with the affected arm. Do not drive yourself home from the hospital or clinic. You may drive 24 hours after the procedure. Do not operate machinery or power tools. KEEP ARM ELEVATED THE REMAINDER OF THE DAY. Do not push, pull or lift anything that is heavier than 10 lb for 5 days. Ask your health care provider when it is okay to: Return to work or school. Resume usual physical activities or sports. Resume sexual activity. General instructions If the catheter site starts to  bleed, raise your arm and put firm pressure on the site. If the bleeding does not stop, get help right away. This is a medical emergency. DRINK PLENTY OF FLUIDS FOR THE NEXT 2-3 DAYS. No alcohol consumption for 24 hours after receiving sedation. If you went home on the same day as your procedure, a responsible adult should be with you for the first 24 hours after you arrive home. Keep all follow-up visits as told by your health care provider. This is important. Contact a health care provider if: You have a fever. You have redness, swelling, or yellow drainage around your insertion site. Get help right away if: You have unusual pain at the radial site. The catheter insertion area swells very fast. The insertion area is bleeding, and the bleeding does not stop when you hold steady pressure on the area. Your arm or hand becomes pale, cool, tingly, or numb. These symptoms may represent a serious problem that is an emergency. Do not wait to see if the symptoms will go away. Get medical help right away. Call your local emergency services (911 in the [place] Do not drive yourself to the hospital. Summary After the procedure, it is common to have bruising and tenderness at the site. Follow instructions from your health care provider about how to take care of your radial site wound. Check the wound every day for signs of infection.  This information is not intended to replace advice given to you by your health care provider. Make sure you discuss any questions you have with  your health care provider. Document Revised: 10/13/2017 Document Reviewed: 10/13/2017 Elsevier Patient Education  2020 [employer].

## 2023-12-14 ENCOUNTER — Ambulatory Visit: Payer: MEDICAID | Admitting: Internal Medicine

## 2023-12-15 ENCOUNTER — Ambulatory Visit: Payer: MEDICAID | Attending: Internal Medicine | Admitting: Internal Medicine

## 2023-12-15 NOTE — Progress Notes (Deleted)
 Cardiology Office Note:  .   Date:  12/15/2023  ID:  Ariana Jensen, DOB 07/22/1966, MRN [id number] PCP: [name], MD  [facility] Providers Cardiologist:  None    History of Present Illness: .   Ariana Jensen is a 58 y.o. female HTN, GERD, polysubstance abuse, bipolar, hepatitis C referral for ED visit in May with chest pain. She had negative w/u.   She's had a SPECT in 2016 - she had possible fixed inferior scar vs. gut attenuation. Echo at that time showed normal function, no WMA. She had syncope in 2019, another echo showing normal function; unremarkable.   Today, she is having persistent diarrhea. She had a recent C diff infection in August. She's lost a significant amount of weight. She notes chest pain and SOB as well. She can barely walk around the block. She notes chest pain as a pressure, like an elephant sitting on her chest.  Father had MI around 73.   ROS:  per HPI otherwise negative  Interim hx 11/11/2023 She returns after her coronary CTA.  Her score was elevated to 2190.  She had significant high risk plaque in her proximal RCA.  FFR did not meet criteria for flow-limiting lesion however with the significant abundance of plaque this can possibly be a false negative.  She also had high risk plaque in the mid LAD and proximal OM1.  There is also a suspected fistula between the distal RCA and the middle cardiac vein.   Studies Reviewed: Marland Kitchen   EKG Interpretation Date/Time:    Ventricular Rate:    PR Interval:    QRS Duration:    QT Interval:    QTC Calculation:   R Axis:      Text Interpretation:      Risk Assessment/Calculations:   The 10-year ASCVD risk score (Arnett DK, et al., 2019) is: 7.5%   Values used to calculate the score:     Age: 67 years     Sex: Female     Is Non-Hispanic African American: No     Diabetic: No     Tobacco smoker: Yes     Systolic Blood Pressure: 138 mmHg     Is BP treated: Yes     HDL Cholesterol: 60 mg/dL      Total Cholesterol: 203 mg/dL       Physical Exam:   VS:  LMP 12/06/2015    Wt Readings from Last 3 Encounters:  11/11/23 131 lb (59.4 kg)  08/05/23 112 lb 9.6 oz (51.1 kg)  06/24/23 104 lb 4.4 oz (47.3 kg)    GEN: Well nourished, well developed in no acute distress NECK: No JVD; No carotid bruits CARDIAC: RRR, no murmurs, rubs, gallops RESPIRATORY:  Clear to auscultation without rales, wheezing or rhonchi  Vasc: 2+ R radial ABDOMEN: Soft, non-tender, non-distended EXTREMITIES:  No edema; No deformity   ASSESSMENT AND PLAN: .   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  No valve disease.  No pulmonary hypertension --> She underwent heart catheterization which did not show any significant lesions. She did not have an AV fistula -Continue aspirin 81 mg daily, continue Crestor 40 mg daily  lipoprotein (a); normal -Continue BB -Will plan for fasting lipids on her next follow-up  HTN Poorly controlled*** - continue coreg 25 mg BID -Continue amlodipine 10 mg daily - she was prescribed clonidine  0.2 mg twice daily  Tobacco Abuse -Recommend smoking cessation  Hepatitis C  -Recommend precautions for any procedures - not treated  Polysubstance abuse hx -on methadone    Informed Consent   Shared Decision Making/Informed Consent The risks [stroke (1 in 1000), death (1 in 1000), kidney failure [usually temporary] (1 in 500), bleeding (1 in 200), allergic reaction [possibly serious] (1 in 200)], benefits (diagnostic support and management of coronary artery disease) and alternatives of a cardiac catheterization were discussed in detail with Ariana Jensen and she is willing to proceed.      Dispo: Follow-up in 1 month  Signed, Laverne Hursey, Alben Spittle, MD

## 2023-12-22 ENCOUNTER — Encounter: Payer: Self-pay | Admitting: Physician Assistant

## 2023-12-22 ENCOUNTER — Ambulatory Visit: Payer: MEDICAID | Admitting: Physician Assistant

## 2023-12-22 ENCOUNTER — Ambulatory Visit: Payer: MEDICAID | Attending: Physician Assistant | Admitting: Physician Assistant

## 2023-12-22 VITALS — BP 113/62 | HR 53 | Temp 97.9°F | Ht 63.0 in | Wt 126.0 lb

## 2023-12-22 DIAGNOSIS — M5442 Lumbago with sciatica, left side: Secondary | ICD-10-CM

## 2023-12-22 DIAGNOSIS — G8929 Other chronic pain: Secondary | ICD-10-CM

## 2023-12-22 DIAGNOSIS — I10 Essential (primary) hypertension: Secondary | ICD-10-CM

## 2023-12-22 MED ORDER — GABAPENTIN 400 MG PO CAPS: 400.0000 mg | ORAL_CAPSULE | Freq: Three times a day (TID) | ORAL | 1 refills | Status: DC

## 2023-12-22 NOTE — Progress Notes (Signed)
 Patient ID: Ariana Jensen, female   DOB: 1966-07-02, 58 y.o.   MRN: [id number]   Ariana Jensen, is a 59 y.o. female  [id number]  [id number]  DOB - 03-22-1966  Chief Complaint  Patient presents with   Hypertension    HTN concerns.  No questions /concerns        Subjective:   Ariana Jensen is a 58 y.o. female here today for a BP check.  She is taking clonidine, carvedilol, and amlodipine.  She says her BP is the best it has ever been.  Admits to poor water intake.  Still smoking.  On 120mg  methadone daily.  Denies dizziness, CP, sob.  She would like to increase dose on gabapentin.  Takes 300mg  tid and has not gone up on dose in a couple of years.   She had recent labs.  And seen by/followed by cardiology  No problems updated.  ALLERGIES: Allergies  Allergen Reactions   Statins Other (See Comments)    MYALGIA    PAST MEDICAL HISTORY: Past Medical History:  Diagnosis Date   Anxiety    ASCUS (atypical squamous cells of undetermined significance) on Pap smear    Body lice 08/05/2015   Chronic lower back pain    Cocaine abuse (HCC)    crack cocaine   Depression    Fibromyalgia    GERD (gastroesophageal reflux disease)    Hepatitis C    Hepatitis C antibody test positive    Hypertension    Hypokalemia 03/04/2018   Leukocytosis 03/04/2018   Opioid dependence (HCC)    PVC's (premature ventricular contractions) 03/04/2018   Scabies 05/13/2021   Seizures (HCC)    "don't know why; I've had 2 in the past year" (08/18/2016)   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   Trichomonas 04/17/2011   Diagnosed 04/02/11 during hospitalization, treated with Flagyl 2g, patient instructed to have partner treated (must follow-up)    Tuberculosis    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 age 85, now s/p 1-yr of multidrug treatment (dates unknown)    MEDICATIONS AT HOME: Prior to Admission medications   Medication Sig Start Date End Date  Taking? Authorizing Provider  amLODipine (NORVASC) 10 MG tablet Take 1 tablet (10 mg total) by mouth daily. 11/11/23  Yes Maisie Fus, MD  aspirin EC 81 MG tablet Take 1 tablet (81 mg total) by mouth daily. Swallow whole. 11/11/23  Yes Branch, Alben Spittle, MD  carvedilol (COREG) 25 MG tablet Take 1 tablet (25 mg total) by mouth 2 (two) times daily. 11/11/23 02/09/24 Yes BranchAlben Spittle, MD  cloNIDine (CATAPRES) 0.2 MG tablet Take 1 tablet (0.2 mg total) by mouth 2 (two) times daily. 08/05/23  Yes [name], MD  divalproex (DEPAKOTE ER) 500 MG 24 hr tablet Take 1 tablet (500 mg total) by mouth every evening. 11/14/22  Yes Eleonore Chiquito, FNP  gabapentin (NEURONTIN) 400 MG capsule Take 1 capsule (400 mg total) by mouth 3 (three) times daily. 12/22/23  Yes Anders Simmonds, PA-C  methadone (DOLOPHINE) 10 MG/ML solution Take 140 mg by mouth daily.   Yes [provider]  naloxone [facility]) nasal spray 4 mg/0.1 mL Use if needed for overdose Patient taking differently: Place 0.4 mg into the nose as needed (overdose). 04/02/22  Yes [name], MD  rosuvastatin (CRESTOR) 40 MG tablet Take 1 tablet (40 mg total) by mouth daily. 11/11/23  Yes Maisie Fus, MD    ROS: Neg HEENT Neg resp Neg  cardiac Neg GI Neg GU Neg MS Neg psych Neg neuro  Objective:   Vitals:   12/22/23 1446  BP: 113/62  Pulse: (!) 53  Temp: 97.9 F (36.6 C)  TempSrc: Oral  SpO2: 94%  Weight: 126 lb (57.2 kg)  Height: 5\' 3"  (1.6 m)   Exam Manual BP 110/78 L arm, sitting, normal cuff General appearance : Awake, alert, not in any distress. Speech Clear. Not toxic looking HEENT: Atraumatic and Normocephalic Neck: Supple, no JVD. No cervical lymphadenopathy.  Chest: Good air entry bilaterally, CTAB.  No rales/rhonchi/wheezing CVS: S1 S2 regular, no murmurs.  Extremities: B/L Lower Ext shows no edema, both legs are warm to touch Neurology: Awake alert, and oriented X 3, CN II-XII intact, Non focal Skin:  No Rash  Data Review Lab Results  Component Value Date   HGBA1C 5.5 03/20/2021   HGBA1C 5.8 (H) 06/22/2018   HGBA1C 5.7 (H) 02/11/2018    Assessment & Plan   1. Chronic bilateral low back pain with left-sided sciatica (Primary) Reviewed recent labs - gabapentin (NEURONTIN) 400 MG capsule; Take 1 capsule (400 mg total) by mouth 3 (three) times daily.  Dispense: 270 capsule; Refill: 1  2. Hypertension, unspecified type At goal.  Can call for RF.  On carvedilol, clonidine,and amlodipine  Increase water intake!!    Return in about 6 months (around 06/22/2024) for PCP for chronic conditions-please assign to new PCP.  The patient was given clear instructions to go to ER or return to medical center if symptoms don't improve, worsen or new problems develop. The patient verbalized understanding. The patient was told to call to get lab results if they haven't heard anything in the next week.      Georgian Co, PA-C [facility] [place], [place] [phone]   12/22/2023, 3:26 PM

## 2023-12-22 NOTE — Patient Instructions (Signed)
 Drink 64 to 80 ounces water daily

## 2024-01-19 NOTE — Progress Notes (Deleted)
 Cardiology Office Note:    Date:  01/19/2024   ID:  Xayah Dunstan, DOB [birth year], MRN [id number]  PCP:  Vernell Goldsmith, MD   [facility] Providers Cardiologist:  None { Click to update primary MD,subspecialty MD or APP then REFRESH:1}    Referring MD: Vernell Goldsmith, MD   No chief complaint on file. ***  History of Present Illness:    Percy Ludwick is a 58 y.o. 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  She was seen by cardiology for workup of severe chest discomfort limiting exertion.  CTA coronary was obtained and FFR was negative suggesting nonobstructive CAD.  However may have been inconclusive given high coronary calcification score. She was referred for definitive angiography which showed nonobstructive disease in LAD, LCX, and RCA. LVEDP was normal.   She presents back today for follow up.     Nonobstructive CAD - risk factor management Was discharged on 81 mg aspirin    Hypertension -Continue 10 mg amlodipine , 25 mg carvedilol  twice daily, 0.2 mg clonidine  twice daily   Hyperlipidemia with LDL goal < 70 11/16/2023: Cholesterol, Total 203; HDL 60; LDL Chol Calc (NIH) 125; Triglycerides 101 - continue 40 mg Crestor     Past Medical History:  Diagnosis Date   Anxiety    ASCUS (atypical squamous cells of undetermined significance) on Pap smear    Body lice 08/05/2015   Chronic lower back pain    Cocaine abuse (HCC)    crack cocaine   Depression    Fibromyalgia    GERD (gastroesophageal reflux disease)    Hepatitis C    Hepatitis C antibody test positive    Hypertension    Hypokalemia 03/04/2018   Leukocytosis 03/04/2018   Opioid dependence (HCC)    PVC's (premature ventricular contractions) 03/04/2018   Scabies 05/13/2021   Seizures (HCC)    "don't know why; I've had 2 in the past year" (08/18/2016)   Severe recurrent major  depression without psychotic features (HCC) 06/21/2018   Trichomonas 04/17/2011   Diagnosed 04/02/11 during hospitalization, treated with Flagyl  2g, patient instructed to have partner treated (must follow-up)    Tuberculosis    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 age 37, now s/p 1-yr of multidrug treatment (dates unknown)    Past Surgical History:  Procedure Laterality Date   CESAREAN SECTION  1984   DILATION AND CURETTAGE OF UTERUS     I & D EXTREMITY  10/18/2011   Procedure: IRRIGATION AND DEBRIDEMENT EXTREMITY;  Surgeon: Shellie Dials, MD;  Location: [facility] OR;  Service: Orthopedics;  Laterality: Left;   I & D EXTREMITY  10/21/2011   Procedure: IRRIGATION AND DEBRIDEMENT EXTREMITY;  Surgeon: Shellie Dials, MD;  Location: [facility] OR;  Service: Orthopedics;  Laterality: Left;   LEFT HEART CATH AND CORONARY ANGIOGRAPHY N/A 11/18/2023   Procedure: LEFT HEART CATH AND CORONARY ANGIOGRAPHY;  Surgeon: Swaziland, Peter M, MD;  Location: [facility] INVASIVE CV LAB;  Service: Cardiovascular;  Laterality: N/A;   SALPINGECTOMY Left March 2010   ectopic pregnancy    Current Medications: No outpatient medications have been marked as taking for the 01/24/24 encounter (Appointment) with Lamond Pilot, PA.     Allergies:   Statins   Social History   Socioeconomic History   Marital status: Widowed    Spouse name: Not on file   Number of children: 1   Years of education: 7th grade  Highest education level: Not on file  Occupational History   Not on file  Tobacco Use   Smoking status: Every Day    Current packs/day: 0.50    Average packs/day: 0.5 packs/day for 32.0 years (16.0 ttl pk-yrs)    Types: Cigarettes   Smokeless tobacco: Never  Vaping Use   Vaping status: Never Used  Substance and Sexual Activity   Alcohol use: Not Currently    Comment: occ   Drug use: Not Currently    Types: Oxycodone , Heroin, Cocaine, IV    Comment: Pt stated "I do heroin"... not used in 2 weeks per pt 03/14/2023   Sexual  activity: Not Currently    Birth control/protection: None  Other Topics Concern   Not on file  Social History Narrative   Lives in [place]   Currently Unemployeed   Social Drivers of Health   Financial Resource Strain: Medium Risk (12/22/2023)   Overall Financial Resource Strain (CARDIA)    Difficulty of Paying Living Expenses: Somewhat hard  Food Insecurity: Food Insecurity Present (12/22/2023)   Hunger Vital Sign    Worried About Running Out of Food in the Last Year: Sometimes true    Ran Out of Food in the Last Year: Sometimes true  Transportation Needs: No Transportation Needs (12/22/2023)   PRAPARE - Administrator, Civil Service (Medical): No    Lack of Transportation (Non-Medical): No  Physical Activity: Insufficiently Active (12/22/2023)   Exercise Vital Sign    Days of Exercise per Week: 3 days    Minutes of Exercise per Session: 30 min  Stress: Stress Concern Present (12/22/2023)   [employer] of Occupational Health - Occupational Stress Questionnaire    Feeling of Stress : To some extent  Social Connections: Socially Isolated (12/22/2023)   Social Connection and Isolation Panel [NHANES]    Frequency of Communication with Friends and Family: Once a week    Frequency of Social Gatherings with Friends and Family: Once a week    Attends Religious Services: Never    Database administrator or Organizations: No    Attends Banker Meetings: Never    Marital Status: Widowed     Family History: The patient's ***family history includes Cirrhosis in her brother; Heart attack in her father, mother, paternal grandfather, and paternal grandmother. There is no history of Breast cancer.  ROS:   Please see the history of present illness.    *** All other systems reviewed and are negative.  EKGs/Labs/Other Studies Reviewed:    The following studies were reviewed today: ***      Recent Labs: 06/23/2023: Magnesium  1.9; TSH 2.918 08/05/2023: ALT  10 11/16/2023: BUN 30; Creatinine, Ser 0.98; Hemoglobin 12.2; Platelets 240; Potassium 5.2; Sodium 139  Recent Lipid Panel    Component Value Date/Time   CHOL 203 (H) 11/16/2023 0827   TRIG 101 11/16/2023 0827   HDL 60 11/16/2023 0827   CHOLHDL 3.4 11/16/2023 0827   CHOLHDL 3.3 06/22/2018 1823   VLDL 43 (H) 06/22/2018 1823   LDLCALC 125 (H) 11/16/2023 0827     Risk Assessment/Calculations:   {Does this patient have ATRIAL FIBRILLATION?:[phone]}  No BP recorded.  {Refresh Note OR Click here to enter BP  :1}***         Physical Exam:    VS:  LMP 12/06/2015     Wt Readings from Last 3 Encounters:  12/22/23 126 lb (57.2 kg)  11/11/23 131 lb (59.4 kg)  08/05/23 112 lb 9.6  oz (51.1 kg)     GEN: *** Well nourished, well developed in no acute distress HEENT: Normal NECK: No JVD; No carotid bruits LYMPHATICS: No lymphadenopathy CARDIAC: ***RRR, no murmurs, rubs, gallops RESPIRATORY:  Clear to auscultation without rales, wheezing or rhonchi  ABDOMEN: Soft, non-tender, non-distended MUSCULOSKELETAL:  No edema; No deformity  SKIN: Warm and dry NEUROLOGIC:  Alert and oriented x 3 PSYCHIATRIC:  Normal affect   ASSESSMENT:    No diagnosis found. PLAN:    In order of problems listed above:  ***      {Are you ordering a CV Procedure (e.g. stress test, cath, DCCV, TEE, etc)?   Press F2        :[id number]}    Medication Adjustments/Labs and Tests Ordered: Current medicines are reviewed at length with the patient today.  Concerns regarding medicines are outlined above.  No orders of the defined types were placed in this encounter.  No orders of the defined types were placed in this encounter.   There are no Patient Instructions on file for this visit.   Signed, Warren Haber Carmon Brigandi, PA  01/19/2024 2:04 PM    [facility]

## 2024-01-24 ENCOUNTER — Ambulatory Visit: Payer: MEDICAID | Attending: Physician Assistant | Admitting: Physician Assistant

## 2024-01-25 ENCOUNTER — Encounter: Payer: Self-pay | Admitting: Physician Assistant

## 2024-03-15 ENCOUNTER — Ambulatory Visit: Payer: Self-pay

## 2024-03-15 NOTE — Telephone Encounter (Signed)
 FYI Only or Action Required?: FYI only for provider.  Patient was last seen in primary care on 12/22/2023 by Ariana Jon HERO, PA-C. Called Nurse Triage reporting Abdominal Pain. Symptoms began a week ago. Interventions attempted: Nothing. Symptoms are: gradually worsening.  Triage Disposition: Go to ED Now (Notify PCP)  Patient/caregiver understands and will follow disposition?: Yes       Copied from CRM [id number]. Topic: Clinical - Red Word Triage >> Mar 15, 2024  1:53 PM [name] wrote: [facility] that prompted transfer to Nurse Triage: severe abdominal pain x 1 week Reason for Disposition  [1] SEVERE pain (e.g., excruciating) AND [2] present > 1 hour  Answer Assessment - Initial Assessment Questions 1. LOCATION: Where does it hurt?      Middle of abd where belly button is; hx hernia removal 2. RADIATION: Does the pain shoot anywhere else? (e.g., chest, back)     no 3. ONSET: When did the pain begin? (e.g., minutes, hours or days ago)      Started a week ago 4. SUDDEN: Gradual or sudden onset?     gradually 5. PATTERN Does the pain come and go, or is it constant?    - If it comes and goes: How long does it last? Do you have pain now?     (Note: Comes and goes means the pain is intermittent. It goes away completely between bouts.)    - If constant: Is it getting better, staying the same, or getting worse?      (Note: Constant means the pain never goes away completely; most serious pain is constant and gets worse.)      constant 6. SEVERITY: How bad is the pain?  (e.g., Scale 1-10; mild, moderate, or severe)    - MILD (1-3): Doesn't interfere with normal activities, abdomen soft and not tender to touch.     - MODERATE (4-7): Interferes with normal activities or awakens from sleep, abdomen tender to touch.     - SEVERE (8-10): Excruciating pain, doubled over, unable to do any normal activities.       severe 7. RECURRENT SYMPTOM: Have you ever had this type of  stomach pain before? If Yes, ask: When was the last time? and What happened that time?      denies 8. CAUSE: What do you think is causing the stomach pain?     unknown 9. RELIEVING/AGGRAVATING FACTORS: What makes it better or worse? (e.g., antacids, bending or twisting motion, bowel movement)     Eating or drinking states worse pain 10. OTHER SYMPTOMS: Do you have any other symptoms? (e.g., back pain, diarrhea, fever, urination pain, vomiting)       vomiting 11. PREGNANCY: Is there any chance you are pregnant? When was your last menstrual period?       na  Protocols used: Abdominal Pain - Female-A-AH

## 2024-03-15 NOTE — Telephone Encounter (Signed)
Noted.  Agree with disposition.

## 2024-03-27 ENCOUNTER — Ambulatory Visit: Payer: Self-pay

## 2024-03-27 NOTE — Telephone Encounter (Signed)
 [facility] is going to [facility] tomorrow at [facility]. Calling [employer] to again transportation. If [employer] calls to verify please let them know we ok transportation for tomorrow [facility] visit so patient will not have to go to [facility].

## 2024-03-27 NOTE — Telephone Encounter (Signed)
 FYI Only or Action Required?: Action required by provider: clinical question for provider.  Patient was last seen in primary care on 12/22/2023 by Danton Jon HERO, PA-C. Called Nurse Triage reporting Abdominal Pain. Symptoms began several weeks ago. Interventions attempted: OTC medications: laxative. Symptoms are: unchanged.  Triage Disposition: See HCP Within 4 Hours (Or PCP Triage)  Patient/caregiver understands and will follow disposition?: YesCopied from CRM [id number]. Topic: Clinical - Red Word Triage >> Mar 27, 2024  9:29 AM Adelita E wrote: [facility] that prompted transfer to Nurse Triage: Abdomen pain. Patient states it has been on and off, but when it gets bad it can get up to an 8-9 out of 10. Patient thinks it could be an infection of some sort. Reason for Disposition  [1] MILD-MODERATE pain AND [2] constant AND [3] present > 2 hours  Answer Assessment - Initial Assessment Questions 1. LOCATION: Where does it hurt?      Under belly button and lower.  2. RADIATION: Does the pain shoot anywhere else? (e.g., chest, back)     Shoots down into right leg 3. ONSET: When did the pain begin? (e.g., minutes, hours or days ago)      2-3 weeks ago   5. PATTERN Does the pain come and go, or is it constant?    - If it comes and goes: How long does it last? Do you have pain now?     (Note: Comes and goes means the pain is intermittent. It goes away completely between bouts.)    - If constant: Is it getting better, staying the same, or getting worse?      (Note: Constant means the pain never goes away completely; most serious pain is constant and gets worse.)      Comes and goes 6. SEVERITY: How bad is the pain?  (e.g., Scale 1-10; mild, moderate, or severe)    - MILD (1-3): Doesn't interfere with normal activities, abdomen soft and not tender to touch.     - MODERATE (4-7): Interferes with normal activities or awakens from sleep, abdomen tender to touch.     - SEVERE (8-10):  Excruciating pain, doubled over, unable to do any normal activities.       No pain currently  7. RECURRENT SYMPTOM: Have you ever had this type of stomach pain before? If Yes, ask: When was the last time? and What happened that time?      no 8. CAUSE: What do you think is causing the stomach pain?     Hernia  9. RELIEVING/AGGRAVATING FACTORS: What makes it better or worse? (e.g., antacids, bending or twisting motion, bowel movement)     When pain medication wears off 10. OTHER SYMPTOMS: Do you have any other symptoms? (e.g., back pain, diarrhea, fever, urination pain, vomiting)       constipation   Pt states she has 2 hernias but hasn't had surgery. Pt feels bowels aren't normal. Pt has constipation. Pt has tried laxative and got relief. Pt feels stool is hard again. RN suggested warm prune juice/butter. No office appts until 8/5. RN advised pt to go to [facility] today due to no office appts until 8/5. It is unclear if pt will go. Pt does have transportation issues.  Pt is asking for call back to see if she can be squeezed in. Please advise.  Protocols used: Abdominal Pain - Female-A-AH

## 2024-04-02 NOTE — Progress Notes (Unsigned)
 Cardiology Office Note:    Date:  04/04/2024   ID:  Ariana Jensen, DOB Mar 28, 1966, MRN [id number]  PCP:  Brien Belvie BRAVO, MD   [facility] Providers Cardiologist:  Wilbert Bihari, MD { Referring MD: Brien Belvie BRAVO, MD   Chief Complaint  Patient presents with   Shortness of Breath   Loss of Consciousness   nonobstructive cad   Hypertension   Hyperlipidemia    History of Present Illness:    Ariana Jensen is a 58 y.o. female with a hx of  nonobstructive CAD by heart catheterization in 11/18/2023, hypertension, polysubstance abuse, bipolar disorder, hepatitis C, and GERD.  She was seen by cardiology for workup of severe chest discomfort limiting exertion.  CTA coronary was obtained and FFR was negative suggesting nonobstructive CAD.  However may have been inconclusive given high coronary calcification score. She was referred for definitive angiography which showed nonobstructive disease in LAD, LCX, and RCA. LVEDP was normal.   She presents back today for follow up. She has been struggling with SOB and fatigue. SOB at rest, orthopnea, and DOE, no LE edema. She also reports collapsing twice - found in the floor. At home, once last week, then again 4 days ago. She was found by her roommate and was taken to bed. She lost consciousness from a standing position - duration unknown. No prodrome.   She can't go to the ER right now because she has too much going on.   She is hypertensive today but has not taken any medications this morning.    Past Medical History:  Diagnosis Date   Anxiety    ASCUS (atypical squamous cells of undetermined significance) on Pap smear    Body lice 08/05/2015   Chronic lower back pain    Cocaine abuse (HCC)    crack cocaine   Depression    Fibromyalgia    GERD (gastroesophageal reflux disease)    Hepatitis C    Hepatitis C antibody test positive    Hypertension    Hypokalemia 03/04/2018   Leukocytosis 03/04/2018   Opioid dependence  (HCC)    PVC's (premature ventricular contractions) 03/04/2018   Scabies 05/13/2021   Seizures (HCC)    don't know why; I've had 2 in the past year (08/18/2016)   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   Trichomonas 04/17/2011   Diagnosed 04/02/11 during hospitalization, treated with Flagyl  2g, patient instructed to have partner treated (must follow-up)    Tuberculosis    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 age 58, now s/p 1-yr of multidrug treatment (dates unknown)    Past Surgical History:  Procedure Laterality Date   CESAREAN SECTION  1984   DILATION AND CURETTAGE OF UTERUS     I & D EXTREMITY  10/18/2011   Procedure: IRRIGATION AND DEBRIDEMENT EXTREMITY;  Surgeon: Prentice LELON Pagan, MD;  Location: [facility] OR;  Service: Orthopedics;  Laterality: Left;   I & D EXTREMITY  10/21/2011   Procedure: IRRIGATION AND DEBRIDEMENT EXTREMITY;  Surgeon: Prentice LELON Pagan, MD;  Location: [facility] OR;  Service: Orthopedics;  Laterality: Left;   LEFT HEART CATH AND CORONARY ANGIOGRAPHY N/A 11/18/2023   Procedure: LEFT HEART CATH AND CORONARY ANGIOGRAPHY;  Surgeon: [place], Peter M, MD;  Location: [facility] INVASIVE CV LAB;  Service: Cardiovascular;  Laterality: N/A;   SALPINGECTOMY Left March 2010   ectopic pregnancy    Current Medications: Current Meds  Medication Sig   amLODipine  (NORVASC ) 10 MG tablet Take 1 tablet (10 mg  total) by mouth daily.   aspirin  EC 81 MG tablet Take 1 tablet (81 mg total) by mouth daily. Swallow whole.   carvedilol  (COREG ) 25 MG tablet Take 1 tablet (25 mg total) by mouth 2 (two) times daily.   cloNIDine  (CATAPRES ) 0.2 MG tablet Take 1 tablet (0.2 mg total) by mouth 2 (two) times daily.   divalproex  (DEPAKOTE  ER) 500 MG 24 hr tablet Take 1 tablet (500 mg total) by mouth every evening.   gabapentin  (NEURONTIN ) 400 MG capsule Take 1 capsule (400 mg total) by mouth 3 (three) times daily.   hydrOXYzine  (ATARAX ) 25 MG tablet Take 25 mg by mouth 3 (three) times  daily as needed.   methadone  (DOLOPHINE ) 10 MG/ML solution Take 140 mg by mouth daily.   naloxone  (NARCAN ) nasal spray 4 mg/0.1 mL Use if needed for overdose   rosuvastatin  (CRESTOR ) 40 MG tablet Take 1 tablet (40 mg total) by mouth daily.     Allergies:   Statins   Social History   Socioeconomic History   Marital status: Widowed    Spouse name: Not on file   Number of children: 1   Years of education: 7th grade   Highest education level: Not on file  Occupational History   Not on file  Tobacco Use   Smoking status: Every Day    Current packs/day: 0.50    Average packs/day: 0.5 packs/day for 32.0 years (16.0 ttl pk-yrs)    Types: Cigarettes   Smokeless tobacco: Never  Vaping Use   Vaping status: Never Used  Substance and Sexual Activity   Alcohol use: Not Currently    Comment: occ   Drug use: Not Currently    Types: Oxycodone , Heroin, Cocaine, IV    Comment: Pt stated I do heroin... not used in 2 weeks per pt 03/14/2023   Sexual activity: Not Currently    Birth control/protection: None  Other Topics Concern   Not on file  Social History Narrative   Lives in [place]   Currently Unemployeed   Social Drivers of Health   Financial Resource Strain: Medium Risk (12/22/2023)   Overall Financial Resource Strain (CARDIA)    Difficulty of Paying Living Expenses: Somewhat hard  Food Insecurity: Food Insecurity Present (12/22/2023)   Hunger Vital Sign    Worried About Running Out of Food in the Last Year: Sometimes true    Ran Out of Food in the Last Year: Sometimes true  Transportation Needs: No Transportation Needs (12/22/2023)   PRAPARE - Administrator, Civil Service (Medical): No    Lack of Transportation (Non-Medical): No  Physical Activity: Insufficiently Active (12/22/2023)   Exercise Vital Sign    Days of Exercise per Week: 3 days    Minutes of Exercise per Session: 30 min  Stress: Stress Concern Present (12/22/2023)   [employer] of Occupational  Health - Occupational Stress Questionnaire    Feeling of Stress : To some extent  Social Connections: Socially Isolated (12/22/2023)   Social Connection and Isolation Panel    Frequency of Communication with Friends and Family: Once a week    Frequency of Social Gatherings with Friends and Family: Once a week    Attends Religious Services: Never    Database administrator or Organizations: No    Attends Banker Meetings: Never    Marital Status: Widowed     Family History: The patient's family history includes Cirrhosis in her brother; Heart attack in her father, mother, paternal grandfather,  and paternal grandmother. There is no history of Breast cancer.  ROS:   Please see the history of present illness.     All other systems reviewed and are negative.  EKGs/Labs/Other Studies Reviewed:    The following studies were reviewed today:  EKG Interpretation Date/Time:  Tuesday April 04 2024 16:09:28 EDT Ventricular Rate:  66 PR Interval:  156 QRS Duration:  84 QT Interval:  488 QTC Calculation: 511 R Axis:   61  Text Interpretation: Sinus rhythm with occasional Premature ventricular complexes Minimal voltage criteria for LVH, may be normal variant ( Sokolow-Lyon ) Prolonged QT When compared with ECG of 11-Nov-2023 14:48, T wave amplitude has increased in Lateral leads Confirmed by Madie Slough ([id number]) on 04/04/2024 4:50:02 PM    Recent Labs: 06/23/2023: Magnesium  1.9; TSH 2.918 08/05/2023: ALT 10 11/16/2023: BUN 30; Creatinine, Ser 0.98; Hemoglobin 12.2; Platelets 240; Potassium 5.2; Sodium 139  Recent Lipid Panel    Component Value Date/Time   CHOL 203 (H) 11/16/2023 0827   TRIG 101 11/16/2023 0827   HDL 60 11/16/2023 0827   CHOLHDL 3.4 11/16/2023 0827   CHOLHDL 3.3 06/22/2018 1823   VLDL 43 (H) 06/22/2018 1823   LDLCALC 125 (H) 11/16/2023 0827     Risk Assessment/Calculations:           Physical Exam:    VS:  BP (!) 152/80   Ht 5' 3 (1.6 m)   Wt 123 lb  3.2 oz (55.9 kg)   LMP 12/06/2015   SpO2 94%   BMI 21.82 kg/m     Wt Readings from Last 3 Encounters:  04/04/24 123 lb 3.2 oz (55.9 kg)  12/22/23 126 lb (57.2 kg)  11/11/23 131 lb (59.4 kg)     GEN:  Well nourished, well developed in no acute distress HEENT: Normal NECK: No JVD; No carotid bruits LYMPHATICS: No lymphadenopathy CARDIAC: irregular rhythm, regular rate RESPIRATORY:  Clear to auscultation without rales, wheezing or rhonchi  ABDOMEN: Soft, non-tender, non-distended MUSCULOSKELETAL:  No edema; No deformity  SKIN: Warm and dry NEUROLOGIC:  Alert and oriented x 3 PSYCHIATRIC:  Normal affect   ASSESSMENT:    1. Dyspnea on exertion   2. Hypertension, unspecified type   3. Syncope, unspecified syncope type   4. PVC's (premature ventricular contractions)   5. Hyperlipidemia with target LDL less than 70   6. Polysubstance abuse (HCC)   7. Coronary artery disease involving native coronary artery of native heart without angina pectoris    PLAN:    In order of problems listed above:  Syncope x 2 last week - two episodes with no prodrome from a standing position   Dyspnea - worse over the last 2 weeks - orthopnea and PND - no LE edema - she continues to smoke cigarettes - no chest pain  - lungs clear on exam   Nonobstructive CAD - risk factor management, continue ASA   Hypertension - PTA: 10 mg amlodipine , 25 mg carvedilol  twice daily, 0.2 mg clonidine  twice daily -- has not taken medications today   Hyperlipidemia with LDL goal < 70 11/16/2023: Cholesterol, Total 203; HDL 60; LDL Chol Calc (NIH) 125; Triglycerides 101 - continue 40 mg Crestor    Polysubstance abuse - she states she hasn't used in 1 month   Irregular rhythm PVCs, bigeminy - EKG obtained that showed SR with PVCs - however, rhythm strip shows PVCs, then bigeminy, and PVC with different morphology - given PND, DOE, syncope, and PVCs without frank signs of CHF -  I advised ER  evaluation and offered to call EMS for her - she refused ER evaluation today stating she has to take care of her elderly roommate (?ex partner) - signed AMA paperwork to leave the office and assured me she would go to the ER in the morning - would rule out PE and monitor on telemetry in ER   Prior Dr. Ronal Ross patient, I will assign to Dr. Shlomo as DOD.             Medication Adjustments/Labs and Tests Ordered: Current medicines are reviewed at length with the patient today.  Concerns regarding medicines are outlined above.  Orders Placed This Encounter  Procedures   EKG 12-Lead   No orders of the defined types were placed in this encounter.   Patient Instructions  PATIENT ADVISED TO GO TO EMERGENCY ROOM BUT DECLINED.   AMA PAPERWORK SIGNED  Follow-Up: At [facility], you and your health needs are our priority.  As part of our continuing mission to provide you with exceptional heart care, our providers are all part of one team.  This team includes your primary Cardiologist (physician) and Advanced Practice Providers or APPs (Physician Assistants and Nurse Practitioners) who all work together to provide you with the care you need, when you need it.  Your next appointment:   KEEP APPOINTMENTS AS SCHEDULED   We recommend signing up for the patient portal called MyChart.  Sign up information is provided on this After Visit Summary.  MyChart is used to connect with patients for Virtual Visits (Telemedicine).  Patients are able to view lab/test results, encounter notes, upcoming appointments, etc.  Non-urgent messages can be sent to your provider as well.   To learn more about what you can do with MyChart, go to [email or web address].     Signed, Jon Nat Hails, [place]  04/04/2024 4:50 PM    [facility]

## 2024-04-04 ENCOUNTER — Ambulatory Visit: Payer: MEDICAID | Admitting: Physician Assistant

## 2024-04-04 ENCOUNTER — Telehealth: Payer: Self-pay | Admitting: Critical Care Medicine

## 2024-04-04 ENCOUNTER — Encounter: Payer: Self-pay | Admitting: Physician Assistant

## 2024-04-04 VITALS — BP 152/80 | Ht 63.0 in | Wt 123.2 lb

## 2024-04-04 DIAGNOSIS — R55 Syncope and collapse: Secondary | ICD-10-CM | POA: Diagnosis not present

## 2024-04-04 DIAGNOSIS — I493 Ventricular premature depolarization: Secondary | ICD-10-CM

## 2024-04-04 DIAGNOSIS — R0609 Other forms of dyspnea: Secondary | ICD-10-CM | POA: Diagnosis present

## 2024-04-04 DIAGNOSIS — F1910 Other psychoactive substance abuse, uncomplicated: Secondary | ICD-10-CM | POA: Diagnosis present

## 2024-04-04 DIAGNOSIS — I2510 Atherosclerotic heart disease of native coronary artery without angina pectoris: Secondary | ICD-10-CM

## 2024-04-04 DIAGNOSIS — E785 Hyperlipidemia, unspecified: Secondary | ICD-10-CM

## 2024-04-04 DIAGNOSIS — I10 Essential (primary) hypertension: Secondary | ICD-10-CM

## 2024-04-04 NOTE — Patient Instructions (Signed)
 PATIENT ADVISED TO GO TO EMERGENCY ROOM BUT DECLINED.   AMA PAPERWORK SIGNED  Follow-Up: At [facility], you and your health needs are our priority.  As part of our continuing mission to provide you with exceptional heart care, our providers are all part of one team.  This team includes your primary Cardiologist (physician) and Advanced Practice Providers or APPs (Physician Assistants and Nurse Practitioners) who all work together to provide you with the care you need, when you need it.  Your next appointment:   KEEP APPOINTMENTS AS SCHEDULED   We recommend signing up for the patient portal called MyChart.  Sign up information is provided on this After Visit Summary.  MyChart is used to connect with patients for Virtual Visits (Telemedicine).  Patients are able to view lab/test results, encounter notes, upcoming appointments, etc.  Non-urgent messages can be sent to your provider as well.   To learn more about what you can do with MyChart, go to [email or web address].

## 2024-04-04 NOTE — Telephone Encounter (Signed)
 Copied from CRM [id number]. Topic: General - Other >> Apr 03, 2024  5:10 PM Zebedee SAUNDERS wrote:  Reason for CRM: Pt would like to know who her Cardiologist is? It was Ronal Ross MD but no longer. Please call pt at [phone]

## 2024-04-06 NOTE — Telephone Encounter (Signed)
 Patient given new # to heart center . Also given appointment with Dr. Newlin;in as her new PCP. Patient formally was a patient of Dr. Brien

## 2024-04-25 ENCOUNTER — Telehealth: Payer: Self-pay | Admitting: Family Medicine

## 2024-04-25 NOTE — Telephone Encounter (Signed)
 Pt confirmed appt 8/5

## 2024-04-26 ENCOUNTER — Ambulatory Visit: Payer: MEDICAID | Attending: Family Medicine | Admitting: Family Medicine

## 2024-04-26 ENCOUNTER — Encounter: Payer: Self-pay | Admitting: Family Medicine

## 2024-04-26 VITALS — BP 164/91 | HR 54 | Ht 63.0 in | Wt 120.2 lb

## 2024-04-26 DIAGNOSIS — G8929 Other chronic pain: Secondary | ICD-10-CM

## 2024-04-26 DIAGNOSIS — F1120 Opioid dependence, uncomplicated: Secondary | ICD-10-CM

## 2024-04-26 DIAGNOSIS — M5442 Lumbago with sciatica, left side: Secondary | ICD-10-CM

## 2024-04-26 DIAGNOSIS — I10 Essential (primary) hypertension: Secondary | ICD-10-CM | POA: Diagnosis not present

## 2024-04-26 DIAGNOSIS — Z1211 Encounter for screening for malignant neoplasm of colon: Secondary | ICD-10-CM

## 2024-04-26 DIAGNOSIS — F314 Bipolar disorder, current episode depressed, severe, without psychotic features: Secondary | ICD-10-CM

## 2024-04-26 DIAGNOSIS — I2510 Atherosclerotic heart disease of native coronary artery without angina pectoris: Secondary | ICD-10-CM

## 2024-04-26 MED ORDER — AMLODIPINE BESYLATE 10 MG PO TABS: 10.0000 mg | ORAL_TABLET | Freq: Every day | ORAL | 1 refills | Status: AC

## 2024-04-26 MED ORDER — GABAPENTIN 300 MG PO CAPS: 600.0000 mg | ORAL_CAPSULE | Freq: Three times a day (TID) | ORAL | 3 refills | Status: DC

## 2024-04-26 NOTE — Progress Notes (Signed)
 Subjective:  Patient ID: Ariana Jensen, female    DOB: 08-15-1966  Age: 58 y.o. MRN: [id number]  CC: Medical Management of Chronic Issues (Discuss increase in Gabapentin )     Discussed the use of AI scribe software for clinical note transcription with the patient, who gave verbal consent to proceed.  History of Present Illness Ariana Jensen is a 58 year old female with a history of CAD, hyperlipidemia, polysubstance abuse, hypertension and chronic pain who presents for medication management and follow-up.  Her blood pressure is elevated today. She ran out of carvedilol , which was intended to last a year since February, and did not take amlodipine  this morning. She is also on rosuvastatin  for hyperlipidemia, with a one-year supply available at the pharmacy but she was unaware that medications were sent to her pharmacy by her cardiologist Dr. Alvan.  She did see the cardiology PA 3 weeks ago.  She experiences chronic pain in her legs and back, managed with gabapentin  and methadone . She requests an increase in gabapentin  due to persistent pain, preferring not to increase methadone . She attends a methadone  clinic three times a week with four take-home doses, soon to be six.  Her shortness of breath has improved with reduced smoking, now down to one cigarette a day from five. She has a history of bipolar disorder but is not currently engaged in behavioral health services. She is due for colon cancer screening.    Past Medical History:  Diagnosis Date   Anxiety    ASCUS (atypical squamous cells of undetermined significance) on Pap smear    Body lice 08/05/2015   Chronic lower back pain    Cocaine abuse (HCC)    crack cocaine   Depression    Fibromyalgia    GERD (gastroesophageal reflux disease)    Hepatitis C    Hepatitis C antibody test positive    Hypertension    Hypokalemia 03/04/2018   Leukocytosis 03/04/2018   Opioid dependence (HCC)    PVC's (premature ventricular  contractions) 03/04/2018   Scabies 05/13/2021   Seizures (HCC)    don't know why; I've had 2 in the past year (08/18/2016)   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   Trichomonas 04/17/2011   Diagnosed 04/02/11 during hospitalization, treated with Flagyl  2g, patient instructed to have partner treated (must follow-up)    Tuberculosis    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 age 26, now s/p 1-yr of multidrug treatment (dates unknown)    Past Surgical History:  Procedure Laterality Date   CESAREAN SECTION  1984   DILATION AND CURETTAGE OF UTERUS     I & D EXTREMITY  10/18/2011   Procedure: IRRIGATION AND DEBRIDEMENT EXTREMITY;  Surgeon: Prentice LELON Pagan, MD;  Location: [facility] OR;  Service: Orthopedics;  Laterality: Left;   I & D EXTREMITY  10/21/2011   Procedure: IRRIGATION AND DEBRIDEMENT EXTREMITY;  Surgeon: Prentice LELON Pagan, MD;  Location: [facility] OR;  Service: Orthopedics;  Laterality: Left;   LEFT HEART CATH AND CORONARY ANGIOGRAPHY N/A 11/18/2023   Procedure: LEFT HEART CATH AND CORONARY ANGIOGRAPHY;  Surgeon: Swazil[place], Peter M, MD;  Location: [facility] INVASIVE CV LAB;  Service: Cardiovascular;  Laterality: N/A;   SALPINGECTOMY Left March 2010   ectopic pregnancy    Family History  Problem Relation Age of Onset   Heart attack Mother    Heart attack Father    Heart attack Paternal Grandmother    Heart attack Paternal Grandfather    Cirrhosis Brother  Breast cancer Neg Hx     Social History   Socioeconomic History   Marital status: Widowed    Spouse name: Not on file   Number of children: 1   Years of education: 7th grade   Highest education level: Not on file  Occupational History   Not on file  Tobacco Use   Smoking status: Every Day    Current packs/day: 0.50    Average packs/day: 0.5 packs/day for 32.0 years (16.0 ttl pk-yrs)    Types: Cigarettes   Smokeless tobacco: Never  Vaping Use   Vaping status: Never Used  Substance and Sexual Activity    Alcohol use: Not Currently    Comment: occ   Drug use: Not Currently    Types: Oxycodone , Heroin, Cocaine, IV    Comment: Pt stated I do heroin... not used in 2 weeks per pt 03/14/2023   Sexual activity: Not Currently    Birth control/protection: None  Other Topics Concern   Not on file  Social History Narrative   Lives in [place]   Currently Unemployeed   Social Drivers of Health   Financial Resource Strain: Medium Risk (12/22/2023)   Overall Financial Resource Strain (CARDIA)    Difficulty of Paying Living Expenses: Somewhat hard  Food Insecurity: Food Insecurity Present (12/22/2023)   Hunger Vital Sign    Worried About Running Out of Food in the Last Year: Sometimes true    Ran Out of Food in the Last Year: Sometimes true  Transportation Needs: No Transportation Needs (12/22/2023)   PRAPARE - Administrator, Civil Service (Medical): No    Lack of Transportation (Non-Medical): No  Physical Activity: Insufficiently Active (12/22/2023)   Exercise Vital Sign    Days of Exercise per Week: 3 days    Minutes of Exercise per Session: 30 min  Stress: Stress Concern Present (12/22/2023)   [employer] of Occupational Health - Occupational Stress Questionnaire    Feeling of Stress : To some extent  Social Connections: Socially Isolated (12/22/2023)   Social Connection and Isolation Panel    Frequency of Communication with Friends and Family: Once a week    Frequency of Social Gatherings with Friends and Family: Once a week    Attends Religious Services: Never    Database administrator or Organizations: No    Attends Banker Meetings: Never    Marital Status: Widowed    Allergies  Allergen Reactions   Statins Other (See Comments)    MYALGIA    Outpatient Medications Prior to Visit  Medication Sig Dispense Refill   aspirin  EC 81 MG tablet Take 1 tablet (81 mg total) by mouth daily. Swallow whole.     carvedilol  (COREG ) 25 MG tablet Take 1 tablet (25 mg  total) by mouth 2 (two) times daily. 180 tablet 3   divalproex  (DEPAKOTE  ER) 500 MG 24 hr tablet Take 1 tablet (500 mg total) by mouth every evening. 60 tablet 6   hydrOXYzine  (ATARAX ) 25 MG tablet Take 25 mg by mouth 3 (three) times daily as needed.     methadone  (DOLOPHINE ) 10 MG/ML solution Take 140 mg by mouth daily.     naloxone  (NARCAN ) nasal spray 4 mg/0.1 mL Use if needed for overdose 1 each 1   rosuvastatin  (CRESTOR ) 40 MG tablet Take 1 tablet (40 mg total) by mouth daily. 180 tablet 3   amLODipine  (NORVASC ) 10 MG tablet Take 1 tablet (10 mg total) by mouth daily. 60 tablet 2  gabapentin  (NEURONTIN ) 400 MG capsule Take 1 capsule (400 mg total) by mouth 3 (three) times daily. 270 capsule 1   cloNIDine  (CATAPRES ) 0.2 MG tablet Take 1 tablet (0.2 mg total) by mouth 2 (two) times daily. (Patient not taking: Reported on 04/26/2024) 120 tablet 4   No facility-administered medications prior to visit.     ROS Review of Systems  Constitutional:  Negative for activity change and appetite change.  HENT:  Negative for sinus pressure and sore throat.   Respiratory:  Negative for chest tightness, shortness of breath and wheezing.   Cardiovascular:  Negative for chest pain and palpitations.  Gastrointestinal:  Negative for abdominal distention, abdominal pain and constipation.  Genitourinary: Negative.   Musculoskeletal:        See HPI  Psychiatric/Behavioral:  Negative for behavioral problems and dysphoric mood.     Objective:  BP (!) 164/91   Pulse (!) 54   Ht 5' 3 (1.6 m)   Wt 120 lb 3.2 oz (54.5 kg)   LMP 12/06/2015   SpO2 95%   BMI 21.29 kg/m      04/26/2024    2:54 PM 04/26/2024    2:02 PM 04/04/2024    3:46 PM  BP/Weight  Systolic BP 164 174 152  Diastolic BP 91 95 80  Wt. (Lbs)  120.2 123.2  BMI  21.29 kg/m2 21.82 kg/m2      Physical Exam Constitutional:      Appearance: She is well-developed.  Cardiovascular:     Rate and Rhythm: Normal rate.     Heart sounds:  Normal heart sounds. No murmur heard. Pulmonary:     Effort: Pulmonary effort is normal.     Breath sounds: Normal breath sounds. No wheezing or rales.  Chest:     Chest wall: No tenderness.  Abdominal:     General: Bowel sounds are normal. There is no distension.     Palpations: Abdomen is soft. There is no mass.     Tenderness: There is no abdominal tenderness.  Musculoskeletal:        General: Normal range of motion.     Right lower leg: No edema.     Left lower leg: No edema.     Comments: No tenderness on palpation of lumbar spine Negative straight leg raise bilaterally  Neurological:     Mental Status: She is alert and oriented to person, place, and time.  Psychiatric:        Mood and Affect: Mood normal.        Latest Ref Rng & Units 11/16/2023    8:27 AM 08/05/2023    1:57 PM 06/24/2023    3:11 AM  CMP  Glucose 70 - 99 mg/dL 88  77  81   BUN 6 - 24 mg/dL 30  22  17    Creatinine 0.57 - 1.00 mg/dL 9.01  9.15  9.23   Sodium 134 - 144 mmol/L 139  141  136   Potassium 3.5 - 5.2 mmol/L 5.2  4.5  4.1   Chloride 96 - 106 mmol/L 102  105  102   CO2 20 - 29 mmol/L 24  21  21    Calcium  8.7 - 10.2 mg/dL 9.1  9.2  8.5   Total Protein 6.0 - 8.5 g/dL  6.8    Total Bilirubin 0.0 - 1.2 mg/dL  <9.7    Alkaline Phos 44 - 121 IU/L  61    AST 0 - 40 IU/L  18  ALT 0 - 32 IU/L  10      Lipid Panel     Component Value Date/Time   CHOL 203 (H) 11/16/2023 0827   TRIG 101 11/16/2023 0827   HDL 60 11/16/2023 0827   CHOLHDL 3.4 11/16/2023 0827   CHOLHDL 3.3 06/22/2018 1823   VLDL 43 (H) 06/22/2018 1823   LDLCALC 125 (H) 11/16/2023 0827    CBC    Component Value Date/Time   WBC 6.8 11/16/2023 0827   WBC 6.3 06/24/2023 0311   RBC 4.14 11/16/2023 0827   RBC 4.03 06/24/2023 0311   HGB 12.2 11/16/2023 0827   HCT 37.3 11/16/2023 0827   PLT 240 11/16/2023 0827   MCV 90 11/16/2023 0827   MCH 29.5 11/16/2023 0827   MCH 29.0 06/24/2023 0311   MCHC 32.7 11/16/2023 0827   MCHC  31.5 06/24/2023 0311   RDW 12.9 11/16/2023 0827   LYMPHSABS 2.1 03/20/2021 1027   MONOABS 0.4 07/18/2018 1833   EOSABS 0.1 01/20/2022 1015   BASOSABS 0.1 03/20/2021 1027    Lab Results  Component Value Date   HGBA1C 5.5 03/20/2021         Assessment & Plan Hypertension Blood pressure elevated due to running out of antihypertensives - Send refill for amlodipine  to the pharmacy. - Instruct her to pick up carvedilol  from the pharmacy. -Counseled on blood pressure goal of less than 130/80, low-sodium, DASH diet, medication compliance, 150 minutes of moderate intensity exercise per week. Discussed medication compliance, adverse effects. - Schedule follow-up in one month to check blood pressure.  Chronic pain and bilateral sciatica Experiencing constant pain in legs and back. Prefers not to increase methadone  dosage. - Increase gabapentin  dose from 400 mg to 600 mg three times a day. - Send prescription for gabapentin  to [facility].  Opioid dependence on methadone  maintenance Attends methadone  clinic three times a week with four take-home doses, soon to be six.  Hyperlipidemia Unaware of taking rosuvastatin  for cholesterol. Has a one-year supply at the pharmacy. - Instruct her to pick up rosuvastatin  from the pharmacy. - Advise to contact the clinic if there are issues with the pharmacy.  Bipolar disorder Not currently seeing behavioral health. Missed appointment with [facility] and unable to reschedule. - Place a new referral to behavioral health.   CAD Asymptomatic Risk factor modification including lipid, blood pressure control and smoking cessation Advised to pick up statin from pharmacy Continue to follow-up with cardiology   Tobacco use disorder Shortness of breath has improved. Reduced smoking to about five cigarettes a day and is working on quitting.   Healthcare maintenance Will address at next visit  Meds ordered this encounter  Medications   amLODipine   (NORVASC ) 10 MG tablet    Sig: Take 1 tablet (10 mg total) by mouth daily.    Dispense:  90 tablet    Refill:  1   gabapentin  (NEURONTIN ) 300 MG capsule    Sig: Take 2 capsules (600 mg total) by mouth 3 (three) times daily.    Dispense:  180 capsule    Refill:  3    Follow-up: Return in about 1 month (around 05/27/2024) for Blood Pressure follow-up with PCP.       Corrina Sabin, MD, FAAFP. [facility] [place], [place] [phone]   04/26/2024, 4:16 PM

## 2024-04-26 NOTE — Patient Instructions (Signed)
 VISIT SUMMARY:  Today, we discussed your elevated blood pressure, chronic pain, medication management, and other health concerns. We reviewed your current medications and made some adjustments to better manage your conditions. We also talked about your progress in reducing smoking and the need for a follow-up appointment.  YOUR PLAN:  -HYPERTENSION: Hypertension means high blood pressure. Your blood pressure was elevated today because you ran out of amlodipine  and did not take it this morning. We have sent a refill for amlodipine  to the pharmacy, and you should also pick up your carvedilol . We will check your blood pressure again in one month.  -CHRONIC PAIN AND BILATERAL SCIATICA: Chronic pain and bilateral sciatica refer to ongoing pain in your legs and back. To help manage your pain, we have increased your gabapentin  dose from 400 mg to 600 mg three times a day. A prescription for gabapentin  has been sent to [facility].  -OPIOID DEPENDENCE ON METHADONE  MAINTENANCE: Opioid dependence means you rely on methadone  to manage your condition. You are attending a methadone  clinic three times a week with four take-home doses, which will soon increase to six.  -HYPERLIPIDEMIA: Hyperlipidemia means high cholesterol levels. You have a one-year supply of rosuvastatin  at the pharmacy. Please pick it up and contact the clinic if you have any issues with the pharmacy.  -BIPOLAR DISORDER: Bipolar disorder is a mental health condition that affects your mood. You are not currently seeing behavioral health services. We have placed a new referral for you to see a behavioral health specialist.  -TOBACCO USE DISORDER: Tobacco use disorder means you are working on quitting smoking. Your shortness of breath has improved, and you have reduced smoking to about one cigarette a day. Keep working on quitting completely.  INSTRUCTIONS:  Please pick up your medications (amlodipine , carvedilol , and rosuvastatin ) from the  pharmacy. Follow up in one month to check your blood pressure. Contact the clinic if you have any issues with the pharmacy. We have placed a new referral for you to see a behavioral health specialist.

## 2024-05-29 ENCOUNTER — Telehealth: Payer: Self-pay | Admitting: Family Medicine

## 2024-05-29 NOTE — Telephone Encounter (Signed)
 Pt confirmed appt 9/9 lvm

## 2024-05-30 ENCOUNTER — Ambulatory Visit: Payer: MEDICAID | Admitting: Family Medicine

## 2024-06-26 ENCOUNTER — Ambulatory Visit: Payer: MEDICAID | Admitting: Internal Medicine

## 2024-06-26 ENCOUNTER — Encounter: Payer: Self-pay | Admitting: Family Medicine

## 2024-06-26 ENCOUNTER — Ambulatory Visit: Payer: MEDICAID | Attending: Internal Medicine | Admitting: Internal Medicine

## 2024-06-26 ENCOUNTER — Telehealth: Payer: Self-pay | Admitting: Family Medicine

## 2024-06-26 NOTE — Telephone Encounter (Signed)
 2nd attempt contacted pt (MAILBOX FULL) to resch appt. Pcp not in the office APPT CANCEL

## 2024-06-26 NOTE — Progress Notes (Deleted)
 error

## 2024-07-03 ENCOUNTER — Ambulatory Visit: Payer: MEDICAID | Admitting: Family Medicine

## 2024-08-01 ENCOUNTER — Ambulatory Visit: Payer: MEDICAID | Admitting: Nurse Practitioner

## 2024-08-03 ENCOUNTER — Ambulatory Visit (HOSPITAL_COMMUNITY): Payer: MEDICAID | Admitting: Physician Assistant

## 2024-08-03 ENCOUNTER — Encounter (HOSPITAL_COMMUNITY): Payer: Self-pay

## 2024-08-09 ENCOUNTER — Ambulatory Visit: Payer: Self-pay

## 2024-08-09 NOTE — Telephone Encounter (Signed)
Noted - patient has appointment scheduled

## 2024-08-09 NOTE — Telephone Encounter (Signed)
 FYI Only or Action Required?: FYI only for provider: appointment scheduled on 11/26.  Patient was last seen in primary care on 04/26/2024 by Newlin, Enobong, MD.  Called Nurse Triage reporting Abdominal Pain.  Symptoms began x 6 days ago.  Interventions attempted: OTC medications: Advil .  Symptoms are: unchanged.  Triage Disposition: See HCP Within 4 Hours (Or PCP Triage)  Patient/caregiver understands and will follow disposition?: Yes     Copied from CRM #[id number]. Topic: Clinical - Red Word Triage >> Aug 09, 2024  9:20 AM Adelita E wrote: [facility] that prompted transfer to Nurse Triage: Patient was in the hospital on 11/15 and stated that she has a lesion on her gallbladder. Patient is still in severe pain, but needing hospital follow up. Reason for Disposition  [1] MILD-MODERATE pain AND [2] constant AND [3] present > 2 hours  Answer Assessment - Initial Assessment Questions 1. LOCATION: Where does it hurt?      Left lower abdomen   2. RADIATION: Does the pain shoot anywhere else? (e.g., chest, back)     Back   3. ONSET: When did the pain begin? (e.g., minutes, hours or days ago)      X 6 days ago   4. SUDDEN: Gradual or sudden onset?     Gradual   5. PATTERN Does the pain come and go, or is it constant?     Constant   6. SEVERITY: How bad is the pain?  (e.g., Scale 1-10; mild, moderate, or severe)     Moderate   7. RECURRENT SYMPTOM: Have you ever had this type of stomach pain before? If Yes, ask: When was the last time? and What happened that time?      No   8. CAUSE: What do you think is causing the stomach pain? (e.g., gallstones, recent abdominal surgery)      Seen in the ED and was advised she has lesions on Gallbladder   9. RELIEVING/AGGRAVATING FACTORS: What makes it better or worse? (e.g., antacids, bending or twisting motion, bowel movement)     Nothing   10. OTHER SYMPTOMS: Do you have any other symptoms? (e.g., back pain,  diarrhea, fever, urination pain, vomiting)  Back pain noted    Patient was discharged from the hospital on 11/16, She was diagnosed with a  lesion on her gallbladder, and referred to follow up with PCP. Appointment scheduled for evaluation on 11/26. Patient agrees with plan of care, and will call back if anything changes, or if symptoms worsen. She was offered an appt. At an alternative office for sooner availability.  Protocols used: Abdominal Pain - Female-A-AH

## 2024-08-16 ENCOUNTER — Ambulatory Visit (HOSPITAL_COMMUNITY): Payer: MEDICAID | Admitting: Mental Health

## 2024-08-16 ENCOUNTER — Inpatient Hospital Stay: Payer: MEDICAID | Admitting: Family Medicine

## 2024-08-16 ENCOUNTER — Telehealth: Payer: Self-pay | Admitting: Family Medicine

## 2024-08-16 NOTE — Telephone Encounter (Signed)
 Called pt to reschedule missed appt; could not reach or leave vm

## 2024-08-23 ENCOUNTER — Ambulatory Visit (HOSPITAL_COMMUNITY): Payer: MEDICAID | Admitting: Mental Health

## 2024-08-23 ENCOUNTER — Other Ambulatory Visit: Payer: Self-pay | Admitting: Family Medicine

## 2024-08-23 DIAGNOSIS — G8929 Other chronic pain: Secondary | ICD-10-CM

## 2024-08-28 ENCOUNTER — Ambulatory Visit: Payer: MEDICAID | Admitting: Nurse Practitioner

## 2024-10-04 ENCOUNTER — Ambulatory Visit: Payer: Self-pay | Admitting: *Deleted

## 2024-10-04 NOTE — Telephone Encounter (Signed)
 Patient has been scheduled for Monday to see PCP

## 2024-10-04 NOTE — Telephone Encounter (Signed)
 FYI Only or Action Required?: Action required by provider: request for appointment.  Patient was last seen in primary care on 04/26/2024 by Newlin, Enobong, MD.  Called Nurse Triage reporting Shortness of Breath.  Symptoms began a week ago.  Interventions attempted: Nothing.  Symptoms are: unchanged.  Triage Disposition: See HCP Within 4 Hours (Or PCP Triage)  Patient/caregiver understands and will follow disposition?: No, refuses disposition   Copied from CRM #[id number]. Topic: Clinical - Red Word Triage >> Oct 04, 2024  9:42 AM Delon DASEN wrote: Red Word that prompted transfer to Nurse Triage: high blood pressure, machine broke to check, shortness of breath Reason for Disposition  [1] MILD difficulty breathing (e.g., minimal/no SOB at rest, SOB with walking, pulse < 100) AND [2] NEW-onset or WORSE than normal  Answer Assessment - Initial Assessment Questions Patient states she has new onset SOB with exertion- denies chest pain, edema, cough. Patient states she gets SOB with walking any distance. Patient relies on transportation service and states she can not come to office before Monday- but would like an appointment with PCP next week. No open appointment- will send request. Patient advised per protocol- please be seen at [facility] for any changes- do not wait. Patient states if she get worse or has changes-she will be seen. ( Patient is living in [facility] now)  1. RESPIRATORY STATUS: Describe your breathing? (e.g., wheezing, shortness of breath, unable to speak, severe coughing)      SOB when exerting- walking distance 2. ONSET: When did this breathing problem begin?      1 week ago- some times worse than other 3. PATTERN Does the difficult breathing come and go, or has it been constant since it started?      Comes and goes 4. SEVERITY: How bad is your breathing? (e.g., mild, moderate, severe)      mild 5. RECURRENT SYMPTOM: Have you had difficulty breathing before? If Yes,  ask: When was the last time? and What happened that time?      no 6. CARDIAC HISTORY: Do you have any history of heart disease? (e.g., heart attack, angina, bypass surgery, angioplasty)      Hx PVC- appointment cardiologist 2/9 7. LUNG HISTORY: Do you have any history of lung disease?  (e.g., pulmonary embolus, asthma, emphysema)     no 8. CAUSE: What do you think is causing the breathing problem?      Unsure 9. OTHER SYMPTOMS: Do you have any other symptoms? (e.g., chest pain, cough, dizziness, fever, runny nose)     no 10. O2 SATURATION MONITOR:  Do you use an oxygen saturation monitor (pulse oximeter) at home? If Yes, ask: What is your reading (oxygen level) today? What is your usual oxygen saturation reading? (e.g., 95%)       no  Protocols used: Breathing Difficulty-A-AH

## 2024-10-06 ENCOUNTER — Telehealth: Payer: Self-pay | Admitting: Family Medicine

## 2024-10-06 NOTE — Telephone Encounter (Signed)
 Contacted pt left vm to confirmed appt

## 2024-10-09 ENCOUNTER — Ambulatory Visit: Payer: Self-pay | Admitting: Family Medicine

## 2024-10-30 ENCOUNTER — Ambulatory Visit: Payer: MEDICAID | Admitting: Cardiology
